# Patient Record
Sex: Female | Born: 1935 | Race: White | Hispanic: No | Smoking: Former smoker
Health system: Southern US, Community
[De-identification: ages and names within clinical notes are randomized; demographics above are authoritative.]

## PROBLEM LIST (undated history)

## (undated) DIAGNOSIS — L719 Rosacea, unspecified: Secondary | ICD-10-CM

## (undated) DIAGNOSIS — E785 Hyperlipidemia, unspecified: Secondary | ICD-10-CM

## (undated) DIAGNOSIS — F32A Depression, unspecified: Secondary | ICD-10-CM

## (undated) DIAGNOSIS — F39 Unspecified mood [affective] disorder: Secondary | ICD-10-CM

## (undated) DIAGNOSIS — N83209 Unspecified ovarian cyst, unspecified side: Secondary | ICD-10-CM

## (undated) DIAGNOSIS — N183 Chronic kidney disease, stage 3 unspecified: Secondary | ICD-10-CM

## (undated) DIAGNOSIS — F329 Major depressive disorder, single episode, unspecified: Secondary | ICD-10-CM

## (undated) DIAGNOSIS — G250 Essential tremor: Secondary | ICD-10-CM

## (undated) DIAGNOSIS — R339 Retention of urine, unspecified: Secondary | ICD-10-CM

## (undated) DIAGNOSIS — N189 Chronic kidney disease, unspecified: Secondary | ICD-10-CM

## (undated) DIAGNOSIS — M5414 Radiculopathy, thoracic region: Secondary | ICD-10-CM

## (undated) DIAGNOSIS — L219 Seborrheic dermatitis, unspecified: Secondary | ICD-10-CM

## (undated) DIAGNOSIS — M546 Pain in thoracic spine: Secondary | ICD-10-CM

## (undated) DIAGNOSIS — G459 Transient cerebral ischemic attack, unspecified: Secondary | ICD-10-CM

## (undated) DIAGNOSIS — I10 Essential (primary) hypertension: Secondary | ICD-10-CM

## (undated) DIAGNOSIS — G610 Guillain-Barre syndrome: Secondary | ICD-10-CM

## (undated) DIAGNOSIS — E039 Hypothyroidism, unspecified: Secondary | ICD-10-CM

## (undated) DIAGNOSIS — R400 Somnolence: Secondary | ICD-10-CM

## (undated) DIAGNOSIS — M810 Age-related osteoporosis without current pathological fracture: Secondary | ICD-10-CM

## (undated) DIAGNOSIS — Z79890 Hormone replacement therapy (postmenopausal): Secondary | ICD-10-CM

## (undated) DIAGNOSIS — H6121 Impacted cerumen, right ear: Secondary | ICD-10-CM

## (undated) DIAGNOSIS — K5900 Constipation, unspecified: Secondary | ICD-10-CM

## (undated) DIAGNOSIS — H6123 Impacted cerumen, bilateral: Secondary | ICD-10-CM

## (undated) DIAGNOSIS — R251 Tremor, unspecified: Secondary | ICD-10-CM

## (undated) HISTORY — DX: Tremor, unspecified: R25.1

## (undated) HISTORY — DX: Age-related osteoporosis without current pathological fracture: M81.0

## (undated) HISTORY — DX: Depression, unspecified: F32.A

## (undated) HISTORY — DX: Hormone replacement therapy: Z79.890

## (undated) HISTORY — DX: Essential (primary) hypertension: I10

## (undated) HISTORY — DX: Somnolence: R40.0

## (undated) HISTORY — DX: Hypothyroidism, unspecified: E03.9

## (undated) HISTORY — DX: Seborrheic dermatitis, unspecified: L21.9

## (undated) HISTORY — DX: Unspecified mood (affective) disorder: F39

## (undated) HISTORY — DX: Chronic kidney disease, unspecified: N18.9

## (undated) HISTORY — PX: HEEL SPUR SURGERY: SHX665

## (undated) HISTORY — PX: GALLBLADDER SURGERY: SHX652

## (undated) HISTORY — DX: Impacted cerumen, bilateral: H61.23

## (undated) HISTORY — DX: Chronic kidney disease, stage 3 unspecified: N18.30

## (undated) HISTORY — DX: Constipation, unspecified: K59.00

## (undated) HISTORY — DX: Essential tremor: G25.0

## (undated) HISTORY — DX: Transient cerebral ischemic attack, unspecified: G45.9

## (undated) HISTORY — DX: Rosacea, unspecified: L71.9

## (undated) HISTORY — DX: Retention of urine, unspecified: R33.9

## (undated) HISTORY — DX: Hyperlipidemia, unspecified: E78.5

## (undated) HISTORY — DX: Radiculopathy, thoracic region: M54.14

## (undated) HISTORY — DX: Unspecified ovarian cyst, unspecified side: N83.209

## (undated) HISTORY — PX: FACIAL COSMETIC SURGERY: SHX629

## (undated) HISTORY — DX: Pain in thoracic spine: M54.6

## (undated) HISTORY — DX: Impacted cerumen, right ear: H61.21

## (undated) HISTORY — PX: TONSILLECTOMY: SUR1361

## (undated) HISTORY — DX: Major depressive disorder, single episode, unspecified: F32.9

---

## 1998-08-21 ENCOUNTER — Ambulatory Visit (HOSPITAL_COMMUNITY): Admission: RE | Admit: 1998-08-21 | Discharge: 1998-08-21 | Payer: Self-pay | Admitting: Gastroenterology

## 1999-09-02 ENCOUNTER — Other Ambulatory Visit: Admission: RE | Admit: 1999-09-02 | Discharge: 1999-09-02 | Payer: Self-pay | Admitting: *Deleted

## 1999-09-09 ENCOUNTER — Encounter: Admission: RE | Admit: 1999-09-09 | Discharge: 1999-09-09 | Payer: Self-pay | Admitting: *Deleted

## 1999-09-09 ENCOUNTER — Encounter: Payer: Self-pay | Admitting: *Deleted

## 2000-02-28 ENCOUNTER — Encounter: Admission: RE | Admit: 2000-02-28 | Discharge: 2000-02-28 | Payer: Self-pay | Admitting: *Deleted

## 2000-02-28 ENCOUNTER — Encounter: Payer: Self-pay | Admitting: *Deleted

## 2000-04-13 ENCOUNTER — Other Ambulatory Visit: Admission: RE | Admit: 2000-04-13 | Discharge: 2000-04-13 | Payer: Self-pay

## 2000-09-22 ENCOUNTER — Encounter: Payer: Self-pay | Admitting: *Deleted

## 2000-09-22 ENCOUNTER — Encounter: Admission: RE | Admit: 2000-09-22 | Discharge: 2000-09-22 | Payer: Self-pay | Admitting: *Deleted

## 2001-09-06 ENCOUNTER — Other Ambulatory Visit: Admission: RE | Admit: 2001-09-06 | Discharge: 2001-09-06 | Payer: Self-pay | Admitting: *Deleted

## 2001-09-16 ENCOUNTER — Encounter: Payer: Self-pay | Admitting: *Deleted

## 2001-09-16 ENCOUNTER — Encounter: Admission: RE | Admit: 2001-09-16 | Discharge: 2001-09-16 | Payer: Self-pay | Admitting: *Deleted

## 2002-03-28 ENCOUNTER — Other Ambulatory Visit: Admission: RE | Admit: 2002-03-28 | Discharge: 2002-03-28 | Payer: Self-pay | Admitting: Obstetrics and Gynecology

## 2002-09-08 ENCOUNTER — Other Ambulatory Visit: Admission: RE | Admit: 2002-09-08 | Discharge: 2002-09-08 | Payer: Self-pay | Admitting: Obstetrics and Gynecology

## 2002-09-30 ENCOUNTER — Encounter: Payer: Self-pay | Admitting: Obstetrics and Gynecology

## 2002-09-30 ENCOUNTER — Encounter: Admission: RE | Admit: 2002-09-30 | Discharge: 2002-09-30 | Payer: Self-pay | Admitting: Obstetrics and Gynecology

## 2002-10-28 ENCOUNTER — Ambulatory Visit (HOSPITAL_COMMUNITY): Admission: RE | Admit: 2002-10-28 | Discharge: 2002-10-28 | Payer: Self-pay | Admitting: Neurology

## 2002-10-28 ENCOUNTER — Encounter: Payer: Self-pay | Admitting: Neurology

## 2003-09-28 ENCOUNTER — Other Ambulatory Visit: Admission: RE | Admit: 2003-09-28 | Discharge: 2003-09-28 | Payer: Self-pay | Admitting: Obstetrics and Gynecology

## 2003-10-13 ENCOUNTER — Encounter: Admission: RE | Admit: 2003-10-13 | Discharge: 2003-10-13 | Payer: Self-pay | Admitting: Family Medicine

## 2003-10-25 ENCOUNTER — Ambulatory Visit (HOSPITAL_COMMUNITY): Admission: RE | Admit: 2003-10-25 | Discharge: 2003-10-25 | Payer: Self-pay | Admitting: Gastroenterology

## 2004-10-22 ENCOUNTER — Encounter: Admission: RE | Admit: 2004-10-22 | Discharge: 2004-10-22 | Payer: Self-pay | Admitting: Obstetrics and Gynecology

## 2005-02-19 ENCOUNTER — Emergency Department (HOSPITAL_COMMUNITY): Admission: EM | Admit: 2005-02-19 | Discharge: 2005-02-19 | Payer: Self-pay | Admitting: Family Medicine

## 2005-02-25 ENCOUNTER — Other Ambulatory Visit: Admission: RE | Admit: 2005-02-25 | Discharge: 2005-02-25 | Payer: Self-pay | Admitting: *Deleted

## 2005-11-27 ENCOUNTER — Encounter: Admission: RE | Admit: 2005-11-27 | Discharge: 2005-11-27 | Payer: Self-pay | Admitting: Family Medicine

## 2006-06-25 ENCOUNTER — Other Ambulatory Visit: Admission: RE | Admit: 2006-06-25 | Discharge: 2006-06-25 | Payer: Self-pay | Admitting: Obstetrics & Gynecology

## 2006-09-08 ENCOUNTER — Encounter: Admission: RE | Admit: 2006-09-08 | Discharge: 2006-09-08 | Payer: Self-pay | Admitting: Obstetrics & Gynecology

## 2007-01-01 ENCOUNTER — Encounter: Admission: RE | Admit: 2007-01-01 | Discharge: 2007-01-01 | Payer: Self-pay | Admitting: Family Medicine

## 2007-02-27 ENCOUNTER — Emergency Department (HOSPITAL_COMMUNITY): Admission: EM | Admit: 2007-02-27 | Discharge: 2007-02-27 | Payer: Self-pay | Admitting: Emergency Medicine

## 2008-02-29 ENCOUNTER — Encounter: Admission: RE | Admit: 2008-02-29 | Discharge: 2008-02-29 | Payer: Self-pay | Admitting: Obstetrics and Gynecology

## 2008-12-11 ENCOUNTER — Encounter: Admission: RE | Admit: 2008-12-11 | Discharge: 2008-12-11 | Payer: Self-pay | Admitting: Family Medicine

## 2010-04-06 ENCOUNTER — Encounter: Payer: Self-pay | Admitting: Obstetrics and Gynecology

## 2010-04-07 ENCOUNTER — Encounter: Payer: Self-pay | Admitting: Obstetrics and Gynecology

## 2010-08-02 NOTE — Op Note (Signed)
NAME:  Brandy Hicks, Brandy Hicks                   ACCOUNT NO.:  [id number]   MEDICAL RECORD NO.:  [id number]                   PATIENT TYPE:  AMB   LOCATION:  ENDO                                 FACILITY:  [facility]   PHYSICIAN:  Anselmo Rod, M.D.               DATE OF BIRTH:  [birth year]   DATE OF PROCEDURE:  10/26/2003  DATE OF DISCHARGE:                                 OPERATIVE REPORT   PROCEDURE:  Screening colonoscopy.   ENDOSCOPIST:  Charna Elizabeth, M.D.   INSTRUMENT USED:  Olympus video colonoscope.   INDICATIONS FOR PROCEDURE:  77 year old white female with a personal history  of colonic polyps and recent history of rectal bleeding undergoing screening  colonoscopy to rule out recurrent polyps.   PREPROCEDURE PREPARATION:  Informed consent was obtained from the patient.  The patient was fasted for eight hours prior to the procedure and prepped  with a bottle of magnesium citrate and a gallon of GoLYTELY the night prior  to the procedure.   PREPROCEDURE PHYSICAL:  Patient with stable vital signs.  Neck supple.  Chest clear to auscultation.  S1 and S2 regular.  Abdomen soft with normal  bowel sounds.  No masses palpable.   DESCRIPTION OF PROCEDURE:  The patient was placed in the left lateral  decubitus position, sedated with 100 mg of Demerol and 7.5 mg Versed in slow  incremental doses.  Once the patient was adequately sedated, maintained on  low flow oxygen and continuous cardiac monitoring, the Olympus video  colonoscope was advanced from the rectum to the cecum.  Prominent external  hemorrhoids and prolapsing internal hemorrhoids were noted.  Small internal  hemorrhoids were also present.  The rest of the colonic mucosa appeared  healthy.  There was some residual stool in the colon, multiple masses were  done.  No masses, polyps, erosions, ulcerations, or diverticula were  appreciated.  The appendiceal orifice and ileocecal valve were clearly  visualized and photographed.   The patient tolerated the procedure well  without complications.  No source of bleeding could be identified besides  the prominent hemorrhoids mentioned above.   IMPRESSION:  Large external and prolapsing internal hemorrhoids, otherwise,  unrevealing colonoscopy.  No masses, polyps, or diverticula seen.   RECOMMENDATIONS:  1. Repeat colonoscopy in the next five years unless the patient develops any     abnormal symptoms in the interim.  2. Outpatient follow up in the next two weeks to discuss possible surgical     evaluation for hemorrhoidectomy.                                               Anselmo Rod, M.D.   JNM/MEDQ  D:  10/26/2003  T:  10/26/2003  Job:  [id number]   cc:   Angelena Sole, M.D. [place]

## 2011-04-09 DIAGNOSIS — G4700 Insomnia, unspecified: Secondary | ICD-10-CM | POA: Insufficient documentation

## 2011-04-09 DIAGNOSIS — F329 Major depressive disorder, single episode, unspecified: Secondary | ICD-10-CM | POA: Insufficient documentation

## 2011-04-09 DIAGNOSIS — F32A Depression, unspecified: Secondary | ICD-10-CM | POA: Insufficient documentation

## 2013-11-28 ENCOUNTER — Encounter: Payer: Self-pay | Admitting: General Surgery

## 2013-11-28 DIAGNOSIS — I10 Essential (primary) hypertension: Secondary | ICD-10-CM

## 2015-04-22 ENCOUNTER — Emergency Department (HOSPITAL_COMMUNITY): Payer: Medicare Other

## 2015-04-22 ENCOUNTER — Inpatient Hospital Stay (HOSPITAL_COMMUNITY)
Admission: EM | Admit: 2015-04-22 | Discharge: 2015-04-24 | DRG: 081 | Disposition: A | Discharge status: Skilled Nursing Facility | Payer: Medicare Other | Attending: Internal Medicine | Admitting: Internal Medicine

## 2015-04-22 ENCOUNTER — Encounter (HOSPITAL_COMMUNITY): Payer: Self-pay

## 2015-04-22 DIAGNOSIS — E039 Hypothyroidism, unspecified: Secondary | ICD-10-CM | POA: Diagnosis not present

## 2015-04-22 DIAGNOSIS — T402X5A Adverse effect of other opioids, initial encounter: Secondary | ICD-10-CM | POA: Diagnosis present

## 2015-04-22 DIAGNOSIS — R4182 Altered mental status, unspecified: Secondary | ICD-10-CM | POA: Diagnosis not present

## 2015-04-22 DIAGNOSIS — R0902 Hypoxemia: Secondary | ICD-10-CM | POA: Diagnosis present

## 2015-04-22 DIAGNOSIS — R197 Diarrhea, unspecified: Secondary | ICD-10-CM | POA: Diagnosis present

## 2015-04-22 DIAGNOSIS — R319 Hematuria, unspecified: Secondary | ICD-10-CM | POA: Diagnosis present

## 2015-04-22 DIAGNOSIS — Z8673 Personal history of transient ischemic attack (TIA), and cerebral infarction without residual deficits: Secondary | ICD-10-CM

## 2015-04-22 DIAGNOSIS — T424X5A Adverse effect of benzodiazepines, initial encounter: Secondary | ICD-10-CM | POA: Diagnosis present

## 2015-04-22 DIAGNOSIS — Y846 Urinary catheterization as the cause of abnormal reaction of the patient, or of later complication, without mention of misadventure at the time of the procedure: Secondary | ICD-10-CM | POA: Diagnosis present

## 2015-04-22 DIAGNOSIS — Z79899 Other long term (current) drug therapy: Secondary | ICD-10-CM | POA: Diagnosis not present

## 2015-04-22 DIAGNOSIS — T426X5A Adverse effect of other antiepileptic and sedative-hypnotic drugs, initial encounter: Secondary | ICD-10-CM | POA: Diagnosis present

## 2015-04-22 DIAGNOSIS — F4321 Adjustment disorder with depressed mood: Secondary | ICD-10-CM | POA: Diagnosis present

## 2015-04-22 DIAGNOSIS — E785 Hyperlipidemia, unspecified: Secondary | ICD-10-CM | POA: Diagnosis present

## 2015-04-22 DIAGNOSIS — Z79891 Long term (current) use of opiate analgesic: Secondary | ICD-10-CM

## 2015-04-22 DIAGNOSIS — Z87891 Personal history of nicotine dependence: Secondary | ICD-10-CM | POA: Diagnosis not present

## 2015-04-22 DIAGNOSIS — I10 Essential (primary) hypertension: Secondary | ICD-10-CM | POA: Diagnosis present

## 2015-04-22 DIAGNOSIS — R400 Somnolence: Principal | ICD-10-CM | POA: Diagnosis present

## 2015-04-22 DIAGNOSIS — T83518A Infection and inflammatory reaction due to other urinary catheter, initial encounter: Secondary | ICD-10-CM | POA: Diagnosis present

## 2015-04-22 DIAGNOSIS — G610 Guillain-Barre syndrome: Secondary | ICD-10-CM | POA: Diagnosis not present

## 2015-04-22 DIAGNOSIS — N390 Urinary tract infection, site not specified: Secondary | ICD-10-CM | POA: Diagnosis present

## 2015-04-22 DIAGNOSIS — F39 Unspecified mood [affective] disorder: Secondary | ICD-10-CM | POA: Diagnosis not present

## 2015-04-22 DIAGNOSIS — R410 Disorientation, unspecified: Secondary | ICD-10-CM | POA: Diagnosis present

## 2015-04-22 DIAGNOSIS — N179 Acute kidney failure, unspecified: Secondary | ICD-10-CM | POA: Diagnosis present

## 2015-04-22 DIAGNOSIS — I959 Hypotension, unspecified: Secondary | ICD-10-CM | POA: Diagnosis not present

## 2015-04-22 DIAGNOSIS — G250 Essential tremor: Secondary | ICD-10-CM | POA: Diagnosis present

## 2015-04-22 HISTORY — DX: Guillain-Barre syndrome: G61.0

## 2015-04-22 LAB — I-STAT TROPONIN, ED: Troponin i, poc: 0 ng/mL (ref 0.00–0.08)

## 2015-04-22 LAB — URINALYSIS, ROUTINE W REFLEX MICROSCOPIC
GLUCOSE, UA: NEGATIVE mg/dL
Hgb urine dipstick: NEGATIVE
KETONES UR: 15 mg/dL — AB
Nitrite: POSITIVE — AB
PH: 5 (ref 5.0–8.0)
Protein, ur: 30 mg/dL — AB
SPECIFIC GRAVITY, URINE: 1.02 (ref 1.005–1.030)

## 2015-04-22 LAB — C DIFFICILE QUICK SCREEN W PCR REFLEX
C DIFFICILE (CDIFF) INTERP: NEGATIVE
C Diff antigen: NEGATIVE
C Diff toxin: NEGATIVE

## 2015-04-22 LAB — COMPREHENSIVE METABOLIC PANEL
ALT: 18 U/L (ref 14–54)
ANION GAP: 12 (ref 5–15)
AST: 33 U/L (ref 15–41)
Albumin: 1.9 g/dL — ABNORMAL LOW (ref 3.5–5.0)
Alkaline Phosphatase: 86 U/L (ref 38–126)
BILIRUBIN TOTAL: 0.7 mg/dL (ref 0.3–1.2)
BUN: 50 mg/dL — AB (ref 6–20)
CALCIUM: 7.2 mg/dL — AB (ref 8.9–10.3)
CO2: 22 mmol/L (ref 22–32)
Chloride: 98 mmol/L — ABNORMAL LOW (ref 101–111)
Creatinine, Ser: 1.5 mg/dL — ABNORMAL HIGH (ref 0.44–1.00)
GFR calc Af Amer: 37 mL/min — ABNORMAL LOW (ref >60)
GFR, EST NON AFRICAN AMERICAN: 32 mL/min — AB (ref >60)
Glucose, Bld: 95 mg/dL (ref 65–99)
POTASSIUM: 3.6 mmol/L (ref 3.5–5.1)
Sodium: 132 mmol/L — ABNORMAL LOW (ref 135–145)
TOTAL PROTEIN: 5.3 g/dL — AB (ref 6.5–8.1)

## 2015-04-22 LAB — URINE MICROSCOPIC-ADD ON

## 2015-04-22 LAB — CBC WITH DIFFERENTIAL/PLATELET
BASOS ABS: 0 10*3/uL (ref 0.0–0.1)
Basophils Relative: 0 %
EOS ABS: 0.1 10*3/uL (ref 0.0–0.7)
Eosinophils Relative: 1 %
HCT: 32.5 % — ABNORMAL LOW (ref 36.0–46.0)
HEMOGLOBIN: 11.3 g/dL — AB (ref 12.0–15.0)
LYMPHS PCT: 8 %
Lymphs Abs: 0.7 10*3/uL (ref 0.7–4.0)
MCH: 31.9 pg (ref 26.0–34.0)
MCHC: 34.8 g/dL (ref 30.0–36.0)
MCV: 91.8 fL (ref 78.0–100.0)
MONO ABS: 0.8 10*3/uL (ref 0.1–1.0)
Monocytes Relative: 9 %
NEUTROS ABS: 6.9 10*3/uL (ref 1.7–7.7)
NEUTROS PCT: 82 %
PLATELETS: 295 10*3/uL (ref 150–400)
RBC: 3.54 MIL/uL — ABNORMAL LOW (ref 3.87–5.11)
RDW: 13.8 % (ref 11.5–15.5)
WBC: 8.5 10*3/uL (ref 4.0–10.5)

## 2015-04-22 LAB — LACTIC ACID, PLASMA
LACTIC ACID, VENOUS: 1.9 mmol/L (ref 0.5–2.0)
Lactic Acid, Venous: 1.9 mmol/L (ref 0.5–2.0)

## 2015-04-22 LAB — VALPROIC ACID LEVEL: VALPROIC ACID LVL: 20 ug/mL — AB (ref 50.0–100.0)

## 2015-04-22 LAB — CBG MONITORING, ED: GLUCOSE-CAPILLARY: 106 mg/dL — AB (ref 65–99)

## 2015-04-22 MED ORDER — SODIUM CHLORIDE 0.9 % IV BOLUS (SEPSIS)
1000.0000 mL | Freq: Once | INTRAVENOUS | Status: AC
  Administered 2015-04-22: 1000 mL via INTRAVENOUS

## 2015-04-22 MED ORDER — CLONAZEPAM 0.5 MG PO TABS
0.5000 mg | ORAL_TABLET | Freq: Three times a day (TID) | ORAL | Status: DC | PRN
  Filled 2015-04-22: qty 1

## 2015-04-22 MED ORDER — HALOPERIDOL LACTATE 5 MG/ML IJ SOLN
5.0000 mg | Freq: Once | INTRAMUSCULAR | Status: AC
  Administered 2015-04-22: 5 mg via INTRAVENOUS

## 2015-04-22 MED ORDER — HALOPERIDOL LACTATE 5 MG/ML IJ SOLN
INTRAMUSCULAR | Status: AC
  Filled 2015-04-22: qty 1

## 2015-04-22 MED ORDER — DEXTROSE 5 % IV SOLN
1.0000 g | INTRAVENOUS | Status: DC
  Administered 2015-04-22 – 2015-04-23 (×2): 1 g via INTRAVENOUS
  Filled 2015-04-22 (×4): qty 10

## 2015-04-22 MED ORDER — SODIUM CHLORIDE 0.9 % IV SOLN: INTRAVENOUS | Status: DC

## 2015-04-22 MED ORDER — ENOXAPARIN SODIUM 30 MG/0.3ML ~~LOC~~ SOLN
30.0000 mg | SUBCUTANEOUS | Status: DC
  Filled 2015-04-22: qty 0.3

## 2015-04-22 MED ORDER — DEXTROSE 5 % IV SOLN
1.0000 g | Freq: Once | INTRAVENOUS | Status: AC
  Administered 2015-04-22: 1 g via INTRAVENOUS
  Filled 2015-04-22: qty 10

## 2015-04-22 MED ORDER — CETYLPYRIDINIUM CHLORIDE 0.05 % MT LIQD
7.0000 mL | Freq: Two times a day (BID) | OROMUCOSAL | Status: DC
  Administered 2015-04-23 (×2): 7 mL via OROMUCOSAL

## 2015-04-22 NOTE — ED Notes (Signed)
To room via EMS.  Pt at [facility] since 04-12-15 for rehab for lower leg weakness d/t guuillain-barre syndrome, in which, symptoms have resolved.  Pts granddaughter at bedside.  Pt has had diarrhea, decreased intake x 3-4 days, onset yesterday pt sleeping more than usual and confused.  Report from EMS reports that pt has been on Vancomycin since 04-20-15 for c-diff.  Granddaughter advised pt has not been on antibiotics and stool sample obtained this morning.  Pt is A&O to self only.

## 2015-04-22 NOTE — H&P (Signed)
Date: 04/22/2015               Patient Name:  Brandy Hicks MRN: [id number]  DOB: 04-Oct-1932 Age / Sex: 82 y.o., female   PCP: No primary care provider on file.         Medical Service: Internal Medicine Teaching Service         Attending Physician: Dr. Nelva Nay, MD    First Contact: Dr. Selina Cooley Pager: [phone]  Second Contact: Dr. Heywood Iles Pager: [phone]       After Hours (After 5p/  First Contact Pager: [phone]  weekends / holidays): Second Contact Pager: [phone]   Chief Complaint: Confusion and somnolence  History of Present Illness:  Brandy Hicks is an 82 year old lady with history of Guillain-Barr syndrome, hypothyroidism, hypertension, and essential tremor presenting with somnolence and confusion.  The patient herself is unable to provide the history, she is confused and lethargic on exam, so the history was obtained by her granddaughter, who is her healthcare power of attorney.  Three weeks ago, she was hospitalized at [facility] in [place] for Guillan Barr syndrome, where she underwent IVIG treatment and had a Foley catheter placed for the duration of her stay of one week. During her hospitalization, her husband died from a ruptured abdominal aortic aneurysm. After she was discharged, her granddaughter, who is her power of attorney, drove her up to [place] where she lives with her 2 kids. She was put in [facility] nursing home, where she has been for the last week. She has not been walking yet, but was mentating well and carry on full conversations during that time.  Five days ago, however, she started acting confused and became very sleepy. She was complaining of intermittent lower back pain, but had no other complaints. 2 days later, she started having profuse watery, foul swelling diarrhea. She did not receive any antibiotics during her stay at [facility], to the granddaughter's knowledge.  I was not able to complete a review of systems as the patient  was altered and unable to provide a history.  Meds: Current Facility-Administered Medications  Medication Dose Route Frequency Provider Last Rate Last Dose  . cefTRIAXone (ROCEPHIN) 1 g in dextrose 5 % 50 mL IVPB  1 g Intravenous Once Serena Y Sam, PA-C 100 mL/hr at 04/22/15 1644 1 g at 04/22/15 1644   Current Outpatient Prescriptions  Medication Sig Dispense Refill  . amLODipine (NORVASC) 5 MG tablet Take 5 mg by mouth daily.    . clonazePAM (KLONOPIN) 0.5 MG tablet Take 0.5 mg by mouth 3 (three) times daily.    . cloNIDine (CATAPRES) 0.1 MG tablet Take 0.1 mg by mouth 2 (two) times daily.    . diclofenac (VOLTAREN) 50 MG EC tablet Take 50 mg by mouth every 8 (eight) hours.    . divalproex (DEPAKOTE SPRINKLE) 125 MG capsule Take 250 mg by mouth at bedtime.    Marland Kitchen levothyroxine (SYNTHROID, LEVOTHROID) 125 MCG tablet Take 125 mcg by mouth daily before breakfast.    . loperamide (IMODIUM A-D) 2 MG tablet Take 2 mg by mouth 4 (four) times daily as needed for diarrhea or loose stools.    Marland Kitchen oxyCODONE (OXYCONTIN) 20 mg 12 hr tablet Take 20 mg by mouth every 12 (twelve) hours.    . primidone (MYSOLINE) 50 MG tablet Take 200 mg by mouth 2 (two) times daily.    . sodium chloride 0.9 % SOLN See admin instructions. , Clysis at 50cc/hour for 3  days, to end at 5pm on 2/7. Started @@ 10pm on 2/4. Schedule at 6AM, 2PM, and 10PM    . zolpidem (AMBIEN) 5 MG tablet Take 5 mg by mouth at bedtime as needed for sleep.      Allergies: Allergies as of 04/22/2015  . (No Known Allergies)   Past Medical History  Diagnosis Date  . Hypertension   . Tremor   . Hyperlipidemia   . Hormone replacement therapy   . Osteoporosis   . Depression   . Hypothyroid   . Rosacea   . TIA (transient ischemic attack)   . Guillain Barr syndrome [facility])    Past Surgical History  Procedure Laterality Date  . Tonsillectomy    . Heel spur surgery    . Facial cosmetic surgery     Family History  Problem Relation Age of  Onset  . Stroke Mother   . Hypertension Father   . CAD Father   . Prostate cancer Father    Social History   Social History  . Marital Status: Married    Spouse Name: N/A  . Number of Children: N/A  . Years of Education: N/A   Occupational History  . Not on file.   Social History Main Topics  . Smoking status: Former Games developer  . Smokeless tobacco: Not on file  . Alcohol Use: Yes  . Drug Use: No  . Sexual Activity: Not on file   Other Topics Concern  . Not on file   Social History Narrative    Review of Systems: Per HPI  Physical Exam: Blood pressure 102/48, pulse 75, temperature 98.4 F (36.9 C), temperature source Rectal, resp. rate 16, SpO2 99 %. General: elderly lady resting in bed comfortably, falling asleep while we talk to her daughter HEENT: no scleral icterus, extra-ocular muscles intact, oropharynx without lesions Cardiac: regular rate and rhythm, no rubs, murmurs or gallops Pulm: breathing well, clear to auscultation bilaterally Abd: bowel sounds normal, soft, nondistended, non-tender Ext: warm and well perfused, without pedal edema Lymph: no cervical or supraclavicular lymphadenopathy Skin: no rash, hair, or nail changes Neuro: alert and oriented X3, cranial nerves II-XII grossly intact, moving all extremities well  Lab results: Basic Metabolic Panel:  Recent Labs  83/15/17 1446  NA 132*  K 3.6  CL 98*  CO2 22  GLUCOSE 95  BUN 50*  CREATININE 1.50*  CALCIUM 7.2*   Liver Function Tests:  Recent Labs  04/22/15 1446  AST 33  ALT 18  ALKPHOS 86  BILITOT 0.7  PROT 5.3*  ALBUMIN 1.9*   CBC:  Recent Labs  04/22/15 1446  WBC 8.5  NEUTROABS 6.9  HGB 11.3*  HCT 32.5*  MCV 91.8  PLT 295   Urinalysis:  Recent Labs  04/22/15 1529  COLORURINE RED*  LABSPEC 1.020  PHURINE 5.0  GLUCOSEU NEGATIVE  HGBUR NEGATIVE  BILIRUBINUR LARGE*  KETONESUR 15*  PROTEINUR 30*  NITRITE POSITIVE*  LEUKOCYTESUR MODERATE*   C. Diff negative  toxin and antigen  Imaging results:  Dg Chest Portable 1 View  04/22/2015  CLINICAL DATA:  Altered mental status for 3 days.  Former smoker. EXAM: PORTABLE CHEST 1 VIEW COMPARISON:  02/27/2007 FINDINGS: The heart size and mediastinal contours are within normal limits. Both lungs are clear. The visualized skeletal structures are unremarkable. IMPRESSION: No active disease. Electronically Signed   By: Elige Ko   On: 04/22/2015 15:09    Other results: EKG: normal EKG, normal sinus rhythm, unchanged from previous tracings.  Assessment & Plan by Problem:  Catheter-associated urinary tract infection: I think this is the most likely source of her somnolence and confusion. She received a dose of ceftriaxone in the emergency department and we'll continue this empirically and tailor antibiotics pending culture results.  She's hypotensive after 1L so we'll continue aggressive rehydration. We may have to consider pressor support if she doesn't respond which is what the grandaugther, power of attorney, agreed to do. She's gotten 1L thus far. -Continue ceftriaxone -Start NS at 100cc/hr after she gets 2L bolus -Bolus to keep MAP>65 -Trending lactic acids  Guillan-barre syndrome: She was treated at [facility] three weeks ago but we can't get access to the records. The granddaughter tells [place] she still is not able to walk. -Will obtain records tomorrow -Consulted PT, OT, and speech therapy  Acute kidney injury: Her creatinine is 1.5; we don't have a baseline but I suspect this is pre-renal from hypovolemia. -Continue hydration per above  Profuse watery diarrhea: C diff toxin was negative so we'll resume ceftriaxone for her UTI.  Hypothyroidism: We'll continue her home dose of levothyroxine -Continue levothyroxine daily  Hypertension: Pressures are soft so we'll hold her antihypertensives. -Holding amlodipine  daily -Holding clonidine 0.1mg  twice daily  Mood disorder: We'll hold her  depakote. -Holding depakote  twice daily  Essential tremor: We'll hold her primidone as this can cause drowsiness. -Holding primidone  twice daily  Dispo: Disposition is deferred at this time, awaiting improvement of current medical problems. Anticipated discharge in approximately 2-4 day(s).   The patient does not have a current PCP (No primary care provider on file.) and does need an [facility] follow-up appointment after discharge.  The patient does have transportation limitations that hinder transportation to clinic appointments.  Signed: Selina Cooley, MD 04/22/2015, 5:01 PM

## 2015-04-22 NOTE — ED Provider Notes (Signed)
CSN: [id number]     Arrival date & time 04/22/15  1359 History   First MD Initiated Contact with Patient 04/22/15 1412     Chief Complaint  Patient presents with  . Altered Mental Status     HPI  [name] is an 82 y.o. female with history of HTN, HLD, guillain barre syndrome who presents to the ED from her nursing home for evaluation of AMS. She is accompanied by her granddaughter who provides her history due to mental status change. Pt moved to [place] one week ago from [place]. She was was discharged from a hospital in [place] ~3 weeks ago where she was hospitalized for weakness and diagnosed with Alene Mires. Pt's granddaughter states that ever since pt has been in the nursing home here she has deteriorated and over the past 3 days in particular become more and more confused. States that pt has not recognized her and has been disoriented, drowsy. Pt's granddaughter also reports pt has had numerous episodes of watery, foul-smelling diarrhea over the past few days. She has not been eating or drinking. Nursing home physician apparently evaluated pt this morning and drew labs including stool sample for c diff study as pt was treated with antibiotics in [place] [facility]. Nursing home physician found pt to be hypoxic to the mid 70s on room air today so EMS was called. In the ED pt SpO2 improved to 99% with 3L O2 by [place]. This is not normal for her. Per pt's granddaughter, pt has no oxygen requirement. At baseline pt reportedly has normal mentation and is very functionally independent. In the ED pt is drowsy but arousable, afebrile, not tachycardic, not hypotensive. She is oriented to person only and states she is in [facility] in [place], states the year is 77.   Past Medical History  Diagnosis Date  . Hypertension   . Tremor   . Hyperlipidemia   . Hormone replacement therapy   . Osteoporosis   . Depression   . Hypothyroid   . Rosacea   . TIA (transient ischemic attack)   . Guillain Barr  syndrome [facility])    Past Surgical History  Procedure Laterality Date  . Tonsillectomy    . Heel spur surgery    . Facial cosmetic surgery     Family History  Problem Relation Age of Onset  . Stroke Mother   . Hypertension Father   . CAD Father   . Prostate cancer Father    Social History  Substance Use Topics  . Smoking status: Former Games developer  . Smokeless tobacco: None  . Alcohol Use: Yes   OB History    No data available     Review of Systems  Unable to perform ROS: Mental status change      Allergies  Review of patient's allergies indicates no known allergies.  Home Medications   Prior to Admission medications   Medication Sig Start Date End Date Taking? Authorizing Provider  amLODipine (NORVASC) 5 MG tablet Take 5 mg by mouth daily.   Yes Historical Provider, MD  clonazePAM (KLONOPIN) 0.5 MG tablet Take 0.5 mg by mouth 3 (three) times daily.   Yes Historical Provider, MD  cloNIDine (CATAPRES) 0.1 MG tablet Take 0.1 mg by mouth 2 (two) times daily.   Yes Historical Provider, MD  diclofenac (VOLTAREN) 50 MG EC tablet Take 50 mg by mouth every 8 (eight) hours.   Yes Historical Provider, MD  divalproex (DEPAKOTE SPRINKLE) 125 MG capsule Take 250 mg by mouth  at bedtime.   Yes Historical Provider, MD  levothyroxine (SYNTHROID, LEVOTHROID) 125 MCG tablet Take 125 mcg by mouth daily before breakfast.   Yes Historical Provider, MD  loperamide (IMODIUM A-D) 2 MG tablet Take 2 mg by mouth 4 (four) times daily as needed for diarrhea or loose stools.   Yes Historical Provider, MD  oxyCODONE (OXYCONTIN) 20 mg 12 hr tablet Take 20 mg by mouth every 12 (twelve) hours.   Yes Historical Provider, MD  primidone (MYSOLINE) 50 MG tablet Take 200 mg by mouth 2 (two) times daily.   Yes Historical Provider, MD  sodium chloride 0.9 % SOLN See admin instructions. , Clysis at 50cc/hour for 3 days, to end at 5pm on 2/7. Started @@ 10pm on 2/4. Schedule at 6AM, 2PM, and 10PM   Yes Historical  Provider, MD  zolpidem (AMBIEN) 5 MG tablet Take 5 mg by mouth at bedtime as needed for sleep.   Yes Historical Provider, MD   BP 124/38 mmHg  Pulse 85  Temp(Src) 98.4 F (36.9 C) (Rectal)  Resp 15  SpO2 98% Physical Exam  Constitutional: She appears lethargic.  HENT:  Right Ear: External ear normal.  Left Ear: External ear normal.  Nose: Nose normal.  Mouth/Throat: Oropharynx is clear and moist.  Eyes: Conjunctivae are normal. Pupils are equal, round, and reactive to light.  Neck: Normal range of motion. Neck supple.  Cardiovascular: Normal rate, regular rhythm, normal heart sounds and intact distal pulses.   Pulmonary/Chest: Effort normal and breath sounds normal. No respiratory distress. She has no wheezes. She exhibits no tenderness.  Abdominal: Soft. Bowel sounds are normal. She exhibits no distension. There is tenderness in the suprapubic area.  Musculoskeletal: She exhibits no edema.  Lymphadenopathy:    She has no cervical adenopathy.  Neurological: She appears lethargic. No cranial nerve deficit.  Drowsy but arousable. Oriented to person only. Will respond to commands to move arms and legs, will squeeze my hand though decreased grip strength that is symmetric bilaterally  Skin: Skin is warm and dry. There is pallor.  Psychiatric: She has a normal mood and affect.  Nursing note and vitals reviewed.   ED Course  Procedures (including critical care time) Labs Review Labs Reviewed  COMPREHENSIVE METABOLIC PANEL - Abnormal; Notable for the following:    Sodium 132 (*)    Chloride 98 (*)    BUN 50 (*)    Creatinine, Ser 1.50 (*)    Calcium 7.2 (*)    Total Protein 5.3 (*)    Albumin 1.9 (*)    GFR calc non Af Amer 32 (*)    GFR calc Af Amer 37 (*)    All other components within normal limits  CBC WITH DIFFERENTIAL/PLATELET - Abnormal; Notable for the following:    RBC 3.54 (*)    Hemoglobin 11.3 (*)    HCT 32.5 (*)    All other components within normal limits   URINALYSIS, ROUTINE W REFLEX MICROSCOPIC (NOT AT [facility]) - Abnormal; Notable for the following:    Color, Urine RED (*)    APPearance CLOUDY (*)    Bilirubin Urine LARGE (*)    Ketones, ur 15 (*)    Protein, ur 30 (*)    Nitrite POSITIVE (*)    Leukocytes, UA MODERATE (*)    All other components within normal limits  URINE MICROSCOPIC-ADD ON - Abnormal; Notable for the following:    Squamous Epithelial / LPF 6-30 (*)    Bacteria, UA MANY (*)  Casts GRANULAR CAST (*)    All other components within normal limits  CBG MONITORING, ED - Abnormal; Notable for the following:    Glucose-Capillary 106 (*)    All other components within normal limits  C DIFFICILE QUICK SCREEN W PCR REFLEX  URINE CULTURE  CULTURE, BLOOD (ROUTINE X 2)  CULTURE, BLOOD (ROUTINE X 2)  LACTIC ACID, PLASMA  LACTIC ACID, PLASMA  VALPROIC ACID LEVEL  I-STAT TROPOININ, ED    Imaging Review Dg Chest Portable 1 View  04/22/2015  CLINICAL DATA:  Altered mental status for 3 days.  Former smoker. EXAM: PORTABLE CHEST 1 VIEW COMPARISON:  02/27/2007 FINDINGS: The heart size and mediastinal contours are within normal limits. Both lungs are clear. The visualized skeletal structures are unremarkable. IMPRESSION: No active disease. Electronically Signed   By: Elige Ko   On: 04/22/2015 15:09   I have personally reviewed and evaluated these images and lab results as part of my medical decision-making.   EKG Interpretation   Date/Time:  Sunday April 22 2015 14:13:34 EST Ventricular Rate:  92 PR Interval:  151 QRS Duration: 84 QT Interval:  346 QTC Calculation: 428 R Axis:   44 Text Interpretation:  Sinus rhythm Nonspecific T abnormalities, lateral  leads ED PHYSICIAN INTERPRETATION AVAILABLE IN [facility] Confirmed  by TEST, Record ([id number]) on 04/23/2015 7:01:02 AM      MDM   Final diagnoses:  Urinary tract infection with hematuria, site unspecified  Altered mental status, unspecified altered mental  status type    Pt is an 82 y.o. female with history of recently diagnosed guillain barre, HTN, HLD, here from nursing home with AMS. History provided by granddaughter. She moved here one week ago and has slowly been declining, though with stark confusion over the past three days. Has had poor PO intake and copious diarrhea. Found to be hypoxic to mid 70s today at nursing home. This is not baseline. She desats to low 80s in the ED with removal of oxygen via [place]. Nonfocal neuro exam, mild low abdominal tenderness.We have no prior records so unclear baseline labs. However, pt found to have nitrite positive UTI (though e/o contamination as well). Slight hyponatremia 132, Cr 1.5, slightly anemic. C diff negative. Pt drowsy but arousable in the room. Responds to my commands but disoriented to time and place. Given UTI and AMS will call internal medicine for admission. Started on IV rocephin in the ED. She has been given 2 L NS so far. She has had soft pressures but otherwise afebrile, no tachycardia. Blood cultures and urine cultures drawn. Her granddaughter is unsure what medications pt is on but I see depakote in her med list so depakote level ordered as well. Labs including troponin and CXR otherwise negative. Unclear etiology of new oxygen requirement.   Spoke to Dr. Allena Katz PGY-2 for internal medicine admission. Will admit to tele.     Carlene Coria, PA-C 04/23/15 1028  Nelva Nay, MD 04/23/15 2308

## 2015-04-23 ENCOUNTER — Encounter (HOSPITAL_COMMUNITY): Payer: Self-pay | Admitting: *Deleted

## 2015-04-23 DIAGNOSIS — N179 Acute kidney failure, unspecified: Secondary | ICD-10-CM

## 2015-04-23 DIAGNOSIS — E039 Hypothyroidism, unspecified: Secondary | ICD-10-CM

## 2015-04-23 DIAGNOSIS — I10 Essential (primary) hypertension: Secondary | ICD-10-CM

## 2015-04-23 DIAGNOSIS — G610 Guillain-Barre syndrome: Secondary | ICD-10-CM

## 2015-04-23 DIAGNOSIS — G250 Essential tremor: Secondary | ICD-10-CM

## 2015-04-23 DIAGNOSIS — R4182 Altered mental status, unspecified: Secondary | ICD-10-CM

## 2015-04-23 DIAGNOSIS — F39 Unspecified mood [affective] disorder: Secondary | ICD-10-CM

## 2015-04-23 DIAGNOSIS — Z79899 Other long term (current) drug therapy: Secondary | ICD-10-CM

## 2015-04-23 DIAGNOSIS — R197 Diarrhea, unspecified: Secondary | ICD-10-CM

## 2015-04-23 LAB — BASIC METABOLIC PANEL
Anion gap: 15 (ref 5–15)
BUN: 42 mg/dL — ABNORMAL HIGH (ref 6–20)
CHLORIDE: 101 mmol/L (ref 101–111)
CO2: 20 mmol/L — ABNORMAL LOW (ref 22–32)
CREATININE: 1.24 mg/dL — AB (ref 0.44–1.00)
Calcium: 7.1 mg/dL — ABNORMAL LOW (ref 8.9–10.3)
GFR, EST AFRICAN AMERICAN: 46 mL/min — AB (ref >60)
GFR, EST NON AFRICAN AMERICAN: 40 mL/min — AB (ref >60)
Glucose, Bld: 97 mg/dL (ref 65–99)
POTASSIUM: 3.6 mmol/L (ref 3.5–5.1)
SODIUM: 136 mmol/L (ref 135–145)

## 2015-04-23 LAB — CBC
HEMATOCRIT: 32.7 % — AB (ref 36.0–46.0)
Hemoglobin: 11.3 g/dL — ABNORMAL LOW (ref 12.0–15.0)
MCH: 31.8 pg (ref 26.0–34.0)
MCHC: 34.6 g/dL (ref 30.0–36.0)
MCV: 92.1 fL (ref 78.0–100.0)
Platelets: 340 10*3/uL (ref 150–400)
RBC: 3.55 MIL/uL — AB (ref 3.87–5.11)
RDW: 13.8 % (ref 11.5–15.5)
WBC: 10.9 10*3/uL — AB (ref 4.0–10.5)

## 2015-04-23 LAB — MRSA PCR SCREENING: MRSA BY PCR: POSITIVE — AB

## 2015-04-23 MED ORDER — CHLORHEXIDINE GLUCONATE CLOTH 2 % EX PADS
6.0000 | MEDICATED_PAD | Freq: Every day | CUTANEOUS | Status: DC
  Administered 2015-04-24: 6 via TOPICAL

## 2015-04-23 MED ORDER — ENOXAPARIN SODIUM 40 MG/0.4ML ~~LOC~~ SOLN
40.0000 mg | SUBCUTANEOUS | Status: DC
  Administered 2015-04-23: 40 mg via SUBCUTANEOUS
  Filled 2015-04-23: qty 0.4

## 2015-04-23 MED ORDER — SODIUM CHLORIDE 0.9 % IV SOLN
INTRAVENOUS | Status: AC
  Administered 2015-04-23: 10:00:00 via INTRAVENOUS

## 2015-04-23 MED ORDER — SODIUM CHLORIDE 0.9 % IV SOLN: INTRAVENOUS | Status: AC

## 2015-04-23 MED ORDER — MUPIROCIN 2 % EX OINT
1.0000 "application " | TOPICAL_OINTMENT | Freq: Two times a day (BID) | CUTANEOUS | Status: DC
  Administered 2015-04-24: 1 via NASAL
  Filled 2015-04-23: qty 22

## 2015-04-23 MED ORDER — AMLODIPINE BESYLATE 5 MG PO TABS
5.0000 mg | ORAL_TABLET | Freq: Every day | ORAL | Status: DC
  Administered 2015-04-23 – 2015-04-24 (×2): 5 mg via ORAL
  Filled 2015-04-23 (×2): qty 1

## 2015-04-23 MED ORDER — LEVOTHYROXINE SODIUM 25 MCG PO TABS
125.0000 ug | ORAL_TABLET | Freq: Every day | ORAL | Status: DC
  Administered 2015-04-24: 125 ug via ORAL
  Filled 2015-04-23: qty 1

## 2015-04-23 MED ORDER — LOPERAMIDE HCL 2 MG PO CAPS: 2.0000 mg | ORAL_CAPSULE | Freq: Four times a day (QID) | ORAL | Status: DC | PRN

## 2015-04-23 MED ORDER — QUETIAPINE FUMARATE 25 MG PO TABS
25.0000 mg | ORAL_TABLET | Freq: Every day | ORAL | Status: DC
  Administered 2015-04-23: 25 mg via ORAL
  Filled 2015-04-23: qty 1

## 2015-04-23 NOTE — Care Management Note (Addendum)
Case Management Note  Patient Details  Name: Brandy Hicks MRN: [id number] Date of Birth: 01/18/1933  Subjective/Objective:   Adm w uti                 Action/Plan: lives w fam prior to snf for rehab per pt's nse, sw ref to be made   Expected Discharge Date:                  Expected Discharge Plan:     In-House Referral:     Discharge planning Services     Post Acute Care Choice:    Choice offered to:     DME Arranged:    DME Agency:     HH Arranged:    HH Agency:     Status of Service:     Medicare Important Message Given:    Date Medicare IM Given:    Medicare IM give by:    Date Additional Medicare IM Given:    Additional Medicare Important Message give by:     If discussed at Long Length of Stay Meetings, dates discussed:    Additional Comments: ur review done  Hanley Hays, RN 04/23/2015, 8:13 AM

## 2015-04-23 NOTE — Progress Notes (Signed)
Received report from Tonya.

## 2015-04-23 NOTE — Evaluation (Signed)
Physical Therapy Evaluation Patient Details Name: Brandy Hicks MRN: [id number] DOB: 1932-05-20 Today's Date: 04/23/2015   History of Present Illness  [name] is an 82 year old lady with history of Guillain-Barr syndrome, hypothyroidism, hypertension, and essential tremor presenting with somnolence and confusion. Three weeks ago, she was hospitalized at [facility] in [place] for Guillan Barr syndrome, where she underwent IVIG treatment and had a Foley catheter placed for the duration of her stay of one week. During her hospitalization, her husband died from a ruptured abdominal aortic aneurysm. After she was discharged, her granddaughter, who is her power of attorney, drove her up to [place] where she lives with her 2 kids. She was put in [facility] nursing home, where she has been for the last week. She has not been walking yet, but was mentating well and carry on full conversations during that time. Five days ago, however, she started acting confused and became very sleepy. She was complaining of intermittent lower back pain, but had no other complaints. 2 days later, she started having profuse watery, foul swelling diarrhea.   Clinical Impression  Pt admitted with above diagnosis. Pt currently with functional limitations due to the deficits listed below (see PT Problem List). Pt participated well with therapy after being awoken from deep sleep. Unaware of pt's baseline mobility or cognitive status at previous SNF. Pt will benefit from skilled PT to increase their independence and safety with mobility to allow discharge to the venue listed below.       Follow Up Recommendations SNF;Supervision/Assistance - 24 hour    Equipment Recommendations  Rolling walker with 5" wheels    Recommendations for Other Services OT consult     Precautions / Restrictions Precautions Precautions: Fall Restrictions Weight Bearing Restrictions: No      Mobility  Bed Mobility Overal bed  mobility: Needs Assistance Bed Mobility: Supine to Sit     Supine to sit: Mod assist        Transfers Overall transfer level: Needs assistance   Transfers: Stand Pivot Transfers;Sit to/from Stand Sit to Stand: Max assist Stand pivot transfers: Max assist          Ambulation/Gait                Stairs            Wheelchair Mobility    Modified Rankin (Stroke Patients Only)       Balance Overall balance assessment: Needs assistance Sitting-balance support: Bilateral upper extremity supported;Feet supported Sitting balance-Leahy Scale: Fair Sitting balance - Comments: required min assist for sitting balance until pt became more aroused then able to sit contact guard.  Postural control: Posterior lean Standing balance support: Bilateral upper extremity supported Standing balance-Leahy Scale: Zero Standing balance comment: Moderate to max assistance to remain upright.                              Pertinent Vitals/Pain Pain Assessment: No/denies pain    Home Living Family/patient expects to be discharged to:: Skilled nursing facility                      Prior Function Level of Independence: Needs assistance         Comments: Pt unable to provide history due to cognitive status     Hand Dominance        Extremity/Trunk Assessment   Upper Extremity Assessment: Generalized weakness  Lower Extremity Assessment: Generalized weakness      Cervical / Trunk Assessment: Kyphotic  Communication      Cognition Arousal/Alertness: Lethargic Behavior During Therapy: Impulsive;Flat affect Overall Cognitive Status: No family/caregiver present to determine baseline cognitive functioning       Memory: Decreased recall of precautions;Decreased short-term memory              General Comments General comments (skin integrity, edema, etc.): Pt in deep sleep upon entry to room, but easily aroused and plesant with  participation in therapy.     Exercises        Assessment/Plan    PT Assessment Patient needs continued PT services  PT Diagnosis Difficulty walking;Generalized weakness   PT Problem List Decreased strength;Decreased range of motion;Decreased activity tolerance;Decreased balance;Decreased mobility;Decreased cognition;Decreased safety awareness  PT Treatment Interventions Functional mobility training;Therapeutic activities;Therapeutic exercise;Balance training;Patient/family education   PT Goals (Current goals can be found in the Care Plan section) Acute Rehab PT Goals Patient Stated Goal: none stated PT Goal Formulation: Patient unable to participate in goal setting Time For Goal Achievement: 05/07/15 Potential to Achieve Goals: Fair    Frequency Min 2X/week   Barriers to discharge        Co-evaluation               End of Session Equipment Utilized During Treatment: Gait belt Activity Tolerance: Patient tolerated treatment well Patient left: in chair;with call bell/phone within reach;with chair alarm set;with nursing/sitter in room Nurse Communication: Mobility status         Time: 1610-9604 PT Time Calculation (min) (ACUTE ONLY): 17 min   Charges:   PT Evaluation $PT Eval Moderate Complexity: 1 Procedure     PT G Codes:       Everlean Cherry, SPT Everlean Cherry 04/23/2015, 4:15 PM

## 2015-04-23 NOTE — Progress Notes (Signed)
Brandy Hicks [id number] Admission Data: 04/23/2015 6:37 PM Attending Provider: Gardiner Barefoot, MD  PCP:No primary care provider on file. Consults/ Treatment Team:    Brandy Hicks is a 82 y.o. female patient admitted from ED awake, alert  & orientated  X 3,  Partial Code, VSS - Blood pressure 161/53, pulse 96, temperature 98.2 F (36.8 C), temperature source Oral, resp. rate 19, height  (1.626 m), weight 63.8 kg (140 lb 10.5 oz), SpO2 94 %., no c/o shortness of breath, no c/o chest pain, no distress noted  IV site WDL:  hand left, condition patent and no redness and wrist left, condition patent and no redness with a transparent dsg that's clean dry and intact.  Allergies:  No Known Allergies   Past Medical History  Diagnosis Date  . Hypertension   . Tremor   . Hyperlipidemia   . Hormone replacement therapy   . Osteoporosis   . Depression   . Hypothyroid   . Rosacea   . TIA (transient ischemic attack)   . Guillain Barr syndrome [facility])     History:  obtained from granddaughter and the patient. Tobacco/alcohol: denied none  Pt orientation to unit, room and routine. Information packet given to patient/family and safety video watched.  Admission INP armband ID verified with patient/family, and in place. SR up x 2, fall risk assessment complete with Patient and family verbalizing understanding of risks associated with falls. Pt verbalizes an understanding of how to use the call bell and to call for help before getting out of bed.  Skin, clean-dry- intact without evidence of bruising, or skin tears.   No evidence of skin break down noted on exam. Stage I on R elbow    Will cont to monitor and assist as needed.  Nupur Hohman, Gretta Cool, RN 04/23/2015 6:37 PM

## 2015-04-23 NOTE — Progress Notes (Signed)
Pt continuously trying to crawl out of bed, yelling at RN's, and becoming increasingly agitated. MD paged and ordered a one time haldol and a PRN klonopin. Will continue to monitor.

## 2015-04-23 NOTE — Progress Notes (Signed)
Patient ID: Brandy Hicks, female   DOB: 05/28/1932, 82 y.o.   MRN: [id number]   Subjective: [name] had a rough night; she's delirious, thinks the year is 59, she's in [place], and has been trying to get out of bed.   Objective: Vital signs in last 24 hours: Filed Vitals:   04/23/15 0600 04/23/15 0700 04/23/15 0800 04/23/15 0900  BP: 110/88 137/95 178/42 162/58  Pulse: 109 110 104 103  Temp:      TempSrc:      Resp: Height:      Weight:      SpO2: 94% 96% 95% 92%   Physical exam: General: resting in bed but looking around in a delirious state, trying to get out bed Cardiac: regular rate and rhythm, no rubs, murmurs or gallops Pulm: breathing well, clear to auscultation bilaterally Abd: bowel sounds normal, soft, nondistended, non-tender Ext: warm and well perfused, without pedal edema Neuro: alert and oriented to place only, cranial nerves II-XII grossly intact, moving all extremities well  Lab Results: Basic Metabolic Panel:  Recent Labs Lab 04/22/15 1446 04/23/15 0219  NA 132* 136  K 3.6 3.6  CL 98* 101  CO2 22 20*  GLUCOSE 95 97  BUN 50* 42*  CREATININE 1.50* 1.24*  CALCIUM 7.2* 7.1*   CBC:  Recent Labs Lab 04/22/15 1446 04/23/15 0219  WBC 8.5 10.9*  NEUTROABS 6.9  --   HGB 11.3* 11.3*  HCT 32.5* 32.7*  MCV 91.8 92.1  PLT 295 340   CBG:  Recent Labs Lab 04/22/15 1527  GLUCAP 106*   Urinalysis:  Recent Labs Lab 04/22/15 1529  COLORURINE RED*  LABSPEC 1.020  PHURINE 5.0  GLUCOSEU NEGATIVE  HGBUR NEGATIVE  BILIRUBINUR LARGE*  KETONESUR 15*  PROTEINUR 30*  NITRITE POSITIVE*  LEUKOCYTESUR MODERATE*    Micro Results: Recent Results (from the past 240 hour(s))  C difficile quick scan w PCR reflex     Status: None   Collection Time: 04/22/15  2:34 PM  Result Value Ref Range Status   C Diff antigen NEGATIVE NEGATIVE Final   C Diff toxin NEGATIVE NEGATIVE Final   C Diff interpretation Negative for toxigenic  C. difficile  Final  MRSA PCR Screening     Status: Abnormal   Collection Time: 04/22/15  9:52 PM  Result Value Ref Range Status   MRSA by PCR POSITIVE (A) NEGATIVE Final    Comment:        The GeneXpert MRSA Assay (FDA approved for NASAL specimens only), is one component of a comprehensive MRSA colonization surveillance program. It is not intended to diagnose MRSA infection nor to guide or monitor treatment for MRSA infections. RESULT CALLED TO, READ BACK BY AND VERIFIED WITH: RN Curtis Sites [id number]  THANEY    Studies/Results: Dg Chest Portable 1 View  04/22/2015  CLINICAL DATA:  Altered mental status for 3 days.  Former smoker. EXAM: PORTABLE CHEST 1 VIEW COMPARISON:  02/27/2007 FINDINGS: The heart size and mediastinal contours are within normal limits. Both lungs are clear. The visualized skeletal structures are unremarkable. IMPRESSION: No active disease. Electronically Signed   By: Elige Ko   On: 04/22/2015 15:09   Medications: I have reviewed the patient's current medications. Scheduled Meds: . antiseptic oral rinse  7 mL Mouth Rinse BID  . cefTRIAXone (ROCEPHIN)  IV  1 g Intravenous Q24H  . [START ON 04/24/2015] Chlorhexidine Gluconate Cloth  6 each Topical Q0600  . enoxaparin (LOVENOX)  injection  30 mg Subcutaneous Q24H  . [START ON 04/24/2015] mupirocin ointment  1 application Nasal BID   Continuous Infusions:  PRN Meds:.  Assessment/Plan:  Delirium: It's hard to say whether this is medication-induced from all of her psychogenic medications or from an underlying urinary tract infection. We'll continue to hold her psychogenic medications and treat her UTI with ceftriaxone. We'll transfer her out of step-down and start quetiapine tonight if she starts sundowning. -Hold oxycontin, ambien, clonazepam, divalproex, primidone -Continue ceftriaxone -Started quetiapine  at night -Sitter to re-orient -Stopped cardiac monitoring -Continue NS at  75cc/hr  Guillan-barre syndrome: She was treated at [facility] with IVIG three weeks ago but we can't get access to the records. The granddaughter tells [place] she still is not able to walk. -Consulted PT, OT, and speech therapy  Acute kidney injury: Her creatinine is improving with fluids; this appears to be pre-renal. -Continue hydration per above  Profuse watery diarrhea: C diff toxin was negative so we'll resume ceftriaxone for her UTI. I suspect this is most likely viral. -Continue loperamide   Hypothyroidism: We'll continue her home dose of levothyroxine -Continue levothyroxine daily  Hypertension: Pressures look better so we'll resume her home amlodipine. -Re-started amlodipine  daily -Holding clonidine 0.1mg  twice daily  Mood disorder: We'll hold her depakote. -Holding depakote  twice daily  Essential tremor: We'll hold her primidone as this can cause drowsiness. -Holding primidone  twice daily  Dispo: Disposition is deferred at this time, awaiting improvement of current medical problems.  Anticipated discharge in approximately 2-4 day(s).   The patient does not have a current PCP (No primary care provider on file.) and does need an [facility] hospital follow-up appointment after discharge.  The patient does have transportation limitations that hinder transportation to clinic appointments.  .Services Needed at time of discharge: Y = Yes, Blank = No PT:   OT:   RN:   Equipment:   Other:     LOS: 1 day   Brandy Cooley, MD 04/23/2015, 9:37 AM

## 2015-04-24 DIAGNOSIS — R400 Somnolence: Principal | ICD-10-CM

## 2015-04-24 DIAGNOSIS — F4321 Adjustment disorder with depressed mood: Secondary | ICD-10-CM

## 2015-04-24 LAB — CBC
HEMATOCRIT: 31.7 % — AB (ref 36.0–46.0)
Hemoglobin: 10.7 g/dL — ABNORMAL LOW (ref 12.0–15.0)
MCH: 30.4 pg (ref 26.0–34.0)
MCHC: 33.8 g/dL (ref 30.0–36.0)
MCV: 90.1 fL (ref 78.0–100.0)
PLATELETS: 358 10*3/uL (ref 150–400)
RBC: 3.52 MIL/uL — AB (ref 3.87–5.11)
RDW: 14.2 % (ref 11.5–15.5)
WBC: 9.6 10*3/uL (ref 4.0–10.5)

## 2015-04-24 LAB — TSH: TSH: 1.941 u[IU]/mL (ref 0.350–4.500)

## 2015-04-24 LAB — BASIC METABOLIC PANEL
Anion gap: 15 (ref 5–15)
BUN: 19 mg/dL (ref 6–20)
CHLORIDE: 106 mmol/L (ref 101–111)
CO2: 17 mmol/L — AB (ref 22–32)
CREATININE: 0.81 mg/dL (ref 0.44–1.00)
Calcium: 7.5 mg/dL — ABNORMAL LOW (ref 8.9–10.3)
GFR calc Af Amer: >60 mL/min (ref >60)
GFR calc non Af Amer: >60 mL/min (ref >60)
Glucose, Bld: 66 mg/dL (ref 65–99)
POTASSIUM: 3.4 mmol/L — AB (ref 3.5–5.1)
Sodium: 138 mmol/L (ref 135–145)

## 2015-04-24 LAB — URINE CULTURE
Culture: NO GROWTH
SPECIAL REQUESTS: NORMAL

## 2015-04-24 MED ORDER — LOPERAMIDE HCL 2 MG PO CAPS: 4.0000 mg | ORAL_CAPSULE | Freq: Four times a day (QID) | ORAL | Status: DC | PRN

## 2015-04-24 MED ORDER — PRIMIDONE 50 MG PO TABS: 50.0000 mg | ORAL_TABLET | Freq: Every day | ORAL | Status: DC

## 2015-04-24 MED ORDER — QUETIAPINE FUMARATE 25 MG PO TABS: 25.0000 mg | ORAL_TABLET | Freq: Every evening | ORAL | Status: DC | PRN

## 2015-04-24 MED ORDER — ACETAMINOPHEN 500 MG PO TABS: 500.0000 mg | ORAL_TABLET | Freq: Four times a day (QID) | ORAL | Status: DC | PRN

## 2015-04-24 NOTE — Progress Notes (Signed)
Lawanda Cousins Widener to be D/C'd to home, then SNF per MD order.  Discussed with the patient and all questions fully answered.  VSS, Skin clean, dry and intact without evidence of skin break down, no evidence of skin tears noted. IV catheter discontinued intact. Site without signs and symptoms of complications. Dressing and pressure applied.  An After Visit Summary was printed and given to the patient. Patient received prescriptions.  Patient instructed to return to ED, call 911, or call MD for any changes in condition.   Patient escorted via PTAR, and D/C home for the night. Pt. Will go to SNF tomorrow.  Theressa Stamps 04/24/2015 4:35 PM

## 2015-04-24 NOTE — Clinical Social Work Note (Signed)
Clinical Social Work Assessment  Patient Details  Name: Brandy Hicks MRN: [id number] Date of Birth: 1932-06-05  Date of referral:  04/24/15               Reason for consult:  Facility Placement                Permission sought to share information with:  Facility Medical sales representative, Family Supports Permission granted to share information::  No (Patient disoriented; completed assessment with daughter)  Name::     Brandy Hicks  Agency::  [facility]  Relationship::  Advertising account executive Information:  [phone]  Housing/Transportation Living arrangements for the past 2 months:  Skilled Nursing Facility Source of Information:  Other (Comment Required) Publishing rights manager ) Patient Interpreter Needed:  None Criminal Activity/Legal Involvement Pertinent to Current Situation/Hospitalization:  No - Comment as needed Significant Relationships:  Other Family Members Lives with:  Self Do you feel safe going back to the place where you live?  Yes Need for family participation in patient care:  Yes (Comment)  Care giving concerns:  CSW received consult regarding returning patient to SNF at discharge. Patient is disoriented so CSW completed assessment w/ granddaughter.    Social Worker assessment / plan:  Patient is from [facility] SNF (patient's things are there still) and would like to return there, but [facility] does not have a bed available today. Patient will discharge home with family for tonight and then, per [facility], will be able to return to [facility] tomorrow.  Employment status:  Retired Health and safety inspector:  Medicare PT Recommendations:  Skilled Nursing Facility Information / Referral to community resources:  Skilled Nursing Facility  Patient/Family's Response to care:  Patient's granddaughter expressed understanding of the situation and requests PTAR to transport patient home with DNR.  Patient/Family's Understanding of and Emotional Response to Diagnosis, Current Treatment, and  Prognosis:  No questions/concerns.  Emotional Assessment Appearance:  Appears stated age Attitude/Demeanor/Rapport:  Unable to Assess Affect (typically observed):  Unable to Assess Orientation:  Oriented to Self, Oriented to Place Alcohol / Substance use:  Not Applicable Psych involvement (Current and /or in the community):  No (Comment)  Discharge Needs  Concerns to be addressed:  No discharge needs identified Readmission within the last 30 days:  No Current discharge risk:  None Barriers to Discharge:  No Barriers Identified   Brandy Hicks, LCSWA 04/24/2015, 1:11 PM

## 2015-04-24 NOTE — Discharge Summary (Signed)
Name: Brandy Hicks MRN: [id number] DOB: June 25, 1932 82 y.o. PCP: No primary care provider on file.  Date of Admission: 04/22/2015  1:59 PM Date of Discharge: 04/24/2015 Attending Physician: Gardiner Barefoot, MD  Discharge Diagnosis: 1. Somnolence from polypharmacy  2. Adjustment disorder with depressed mood 3. Guillan-Barre syndrome 4. Hypothyroidism  Discharge Medications:   Medication List    STOP taking these medications        clonazePAM 0.5 MG tablet  Commonly known as:  KLONOPIN     cloNIDine 0.1 MG tablet  Commonly known as:  CATAPRES     diclofenac 50 MG EC tablet  Commonly known as:  VOLTAREN     divalproex 125 MG capsule  Commonly known as:  DEPAKOTE SPRINKLE     oxyCODONE 20 mg 12 hr tablet  Commonly known as:  OXYCONTIN     sodium chloride 0.9 % Soln     zolpidem 5 MG tablet  Commonly known as:  AMBIEN      TAKE these medications        acetaminophen 500 MG tablet  Commonly known as:  TYLENOL  Take 1 tablet (500 mg total) by mouth every 6 (six) hours as needed for moderate pain.     amLODipine 5 MG tablet  Commonly known as:  NORVASC  Take 5 mg by mouth daily.     levothyroxine 125 MCG tablet  Commonly known as:  SYNTHROID, LEVOTHROID  Take 125 mcg by mouth daily before breakfast.     loperamide 2 MG tablet  Commonly known as:  IMODIUM A-D  Take 2 mg by mouth 4 (four) times daily as needed for diarrhea or loose stools.     primidone 50 MG tablet  Commonly known as:  MYSOLINE  Take 1 tablet (50 mg total) by mouth daily.     QUEtiapine 25 MG tablet  Commonly known as:  SEROQUEL  Take 1 tablet (25 mg total) by mouth at bedtime as needed.       Disposition and follow-up:   Ms.Brandy Hicks was discharged from [facility] in Stable condition.  At the hospital follow up visit please address:  1. Avoid psychotropic medications  2. Consider referral to psychiatry should her depression continue  3. Ensures she  follows with neurology for her Guillan Barre syndrome  4. Follow up TSH  Procedures Performed:  Dg Chest Portable 1 View  04/22/2015  CLINICAL DATA:  Altered mental status for 3 days.  Former smoker. EXAM: PORTABLE CHEST 1 VIEW COMPARISON:  02/27/2007 FINDINGS: The heart size and mediastinal contours are within normal limits. Both lungs are clear. The visualized skeletal structures are unremarkable. IMPRESSION: No active disease. Electronically Signed   By: Brandy Hicks   On: 04/22/2015 15:09    Admission HPI:   [name] is an 82 year old lady with history of Guillain-Barr syndrome, hypothyroidism, hypertension, and essential tremor presenting with somnolence and confusion.  The patient herself is unable to provide the history, she is confused and lethargic on exam, so the history was obtained by her granddaughter, who is her healthcare power of attorney.  Three weeks ago, she was hospitalized at [facility] in [place] for Guillan Barr syndrome, where she underwent IVIG treatment and had a Foley catheter placed for the duration of her stay of one week. During her hospitalization, her husband died from a ruptured abdominal aortic aneurysm. After she was discharged, her granddaughter, who is her power of attorney, drove her up to  [place] where she lives with her 2 kids. She was put in [facility] nursing home, where she has been for the last week. She has not been walking yet, but was mentating well and carry on full conversations during that time.  Five days ago, however, she started acting confused and became very sleepy. She was complaining of intermittent lower back pain, but had no other complaints. 2 days later, she started having profuse watery, foul swelling diarrhea. She did not receive any antibiotics during her stay at [facility], to the granddaughter's knowledge.  I was not able to complete a review of systems as the patient was altered and unable to provide a history.  Hospital  Course by problem list:  1. Somnolence and confusion from polypharmacy: She presented with a 5 day history of progressive confusion and somnolence since moving to a [facility]. At the SNF, her Depakote dosage was increased, she was started on ambien nightly, and was also receiving oxycontin, klonazepam, and primidone. Her mental status improved dramatically after holding these medications for a day. She was delirious at night so we started quetiapine as needed. Her urinalysis on admission was equivocal for a urinary tract infection, but given her history of Foley catheter in the last week, she was given 3 doses of ceftriaxone. She did not complain of dysuria or frequency and did not have a leukocytosis, fever, so we stopped ceftriaxone and did not discharge her with an antibiotic. We held most of her psychotropics upon discharge per above.  2. Asymptomatic bacteriuria: Urinalysis was concerning for bacterial infection per above but we decided against continuing antibiotics beyond 3 doses of ceftriaxone as she was clinically asymptomatic and her altered mental status resolved quickly after we stopped her psychotropic medications.  2. Adjustment disorder with depressed mood: Her boyfriend died 3 weeks ago from a ruptured aortic aneurysm. Since then, she has been feeling depressed, but says she has good family support and is hopeful about feeling better. She was on Depakote upon admission, but denies any history of seizure or bipolar disorder, so this was discontinued. We recommend she follow with a psychiatrist upon discharge. Going forward, should she continue to be depressed, I think she is a good candidate for an SSRI.  3. Guillan-Barre syndrome: She was diagnosed with Deon Barr syndrome at [facility] in [place] 3 weeks ago, presumptively induced by a viral illness as this is preceded by acute bronchitis. Records show she had a lumbar puncture that did not show GBS, but she was treated empirically  with IVIG. Her lower extremity paralysis has been gradually improving since then. We will refer her to neurology for further evaluation upon discharge.  4. Hypothyroidism: We continued her home dose of levothyroxine 125 g daily. A TSH was ordered, but has not yet resulted upon discharge, will need to be followed up.  Discharge Vitals:   BP 162/65 mmHg  Pulse 104  Temp(Src) 98.4 F (36.9 C) (Oral)  Resp 16  Ht  (1.626 m)  Wt 63.8 kg (140 lb 10.5 oz)  BMI 24.13 kg/m2  SpO2 95%  Discharge Labs:  No results found for this or any previous visit (from the past 24 hour(s)).  Signed: Selina Cooley, MD 04/24/2015, 10:12 AM

## 2015-04-24 NOTE — [place] FL2 (Signed)
[place] MEDICAID FL2 LEVEL OF CARE SCREENING TOOL     IDENTIFICATION  Patient Name: Brandy Hicks Birthdate: Aug 03, 1932 Sex: female Admission Date (Current Location): 04/22/2015  [facility] and [place] Number:  Producer, television/film/video and Address:  [facility], [address], [place], [place] [address]      Provider Number: [id number]  Attending Physician Name and Address:  Gardiner Barefoot, MD  Relative Name and Phone Number:  Verlin Fester, [phone]    Current Level of Care: Hospital Recommended Level of Care: Skilled Nursing Facility Prior Approval Number:    Date Approved/Denied:   PASRR Number: [id number]  Discharge Plan: SNF    Current Diagnoses: Patient Active Problem List   Diagnosis Date Noted  . Altered mental status   . UTI (urinary tract infection) 04/22/2015  . Acute kidney injury (HCC) 04/22/2015  . Essential hypertension, benign 11/28/2013    Orientation RESPIRATION BLADDER Height & Weight     Self, Place  Normal Continent Weight: 140 lb 10.5 oz (63.8 kg) Height:   (162.6 cm)  BEHAVIORAL SYMPTOMS/MOOD NEUROLOGICAL BOWEL NUTRITION STATUS      Incontinent (Rectal tube)  (Please see DC summary)  AMBULATORY STATUS COMMUNICATION OF NEEDS Skin   Extensive Assist Verbally Normal                       Personal Care Assistance Level of Assistance  Bathing, Feeding, Dressing Bathing Assistance: Maximum assistance Feeding assistance: Limited assistance Dressing Assistance: Limited assistance     Functional Limitations Info             SPECIAL CARE FACTORS FREQUENCY  PT (By licensed PT)     PT Frequency: min 2x/week              Contractures      Additional Factors Info  Psychotropic Code Status Info: Partial Allergies Info: NKA Psychotropic Info: Seroquel   Isolation Precautions Info: MRSA     Current Medications (04/24/2015):  This is the current hospital active medication  list Current Facility-Administered Medications  Medication Dose Route Frequency Provider Last Rate Last Dose  . amLODipine (NORVASC) tablet 5 mg  5 mg Oral Daily Selina Cooley, MD   5 mg at 04/24/15 2841  . antiseptic oral rinse (CPC / CETYLPYRIDINIUM CHLORIDE 0.05%) solution 7 mL  7 mL Mouth Rinse BID Doneen Poisson, MD   7 mL at 04/23/15 2211  . Chlorhexidine Gluconate Cloth 2 % PADS 6 each  6 each Topical Q0600 Gardiner Barefoot, MD   6 each at 04/24/15 [phone]  . enoxaparin (LOVENOX) injection 40 mg  40 mg Subcutaneous Q24H Gardiner Barefoot, MD   40 mg at 04/23/15 2014  . levothyroxine (SYNTHROID, LEVOTHROID) tablet 125 mcg  125 mcg Oral QAC breakfast Selina Cooley, MD   125 mcg at 04/24/15 [phone]  . loperamide (IMODIUM) capsule 4 mg  4 mg Oral QID PRN Selina Cooley, MD      . mupirocin ointment (BACTROBAN) 2 % 1 application  1 application Nasal BID Gardiner Barefoot, MD   1 application at 04/24/15 [phone]  . QUEtiapine (SEROQUEL) tablet 25 mg  25 mg Oral QHS Selina Cooley, MD   25 mg at 04/23/15 2211     Discharge Medications: Please see discharge summary for a list of discharge medications.  Relevant Imaging Results:  Relevant Lab Results:   Additional Information SSN: [id number] [place], [place]

## 2015-04-24 NOTE — Progress Notes (Signed)
Patient ID: Brandy Hicks, female   DOB: August 23, 1932, 82 y.o.   MRN: [id number]   Subjective: [name] is remarkably more cogent today than yesterday. She tells me she feels fine physically but she's depressed about the death of her boyfriend. They were together for four years and he died there weeks ago from a ruptured aortic aneurysm. She had been in a psychiatric hospital in her 34s because she felt "so sad." She did not want to see a psychiatrist but felt she had adequate support from her granddaughter and family. They were in the room and agreed she is back to her baseline. I also reviewed her SNF records more closely. Her depakote level had been increased and she was getting Ambien nightly, in addition to klonazepam and opiates.  Objective: Vital signs in last 24 hours: Filed Vitals:   04/23/15 2000 04/23/15 2112 04/24/15 0505 04/24/15 0531  BP: 153/46 168/91 189/57 162/65  Pulse: 86 98 104   Temp:  98.9 F (37.2 C) 98.4 F (36.9 C)   TempSrc:  Oral Oral   Resp:  16 16   Height:      Weight:      SpO2:  97% 95%    Physical exam: General: resting in bed but , with mild essential tremor, much more lucid today than yesterday Cardiac: regular rate and rhythm, no rubs, murmurs or gallops Pulm: breathing well, clear to auscultation bilaterally Abd: bowel sounds normal, soft, nondistended, non-tender, rectal tube with watery diarrhea Ext: warm and well perfused, without pedal edema Neuro: alert and oriented x 3, knows how many quarters in a dollar, cranial nerves II-XII grossly intact, moving all extremities well  Medications: I have reviewed the patient's current medications. Scheduled Meds: . amLODipine  5 mg Oral Daily  . antiseptic oral rinse  7 mL Mouth Rinse BID  . cefTRIAXone (ROCEPHIN)  IV  1 g Intravenous Q24H  . Chlorhexidine Gluconate Cloth  6 each Topical Q0600  . enoxaparin (LOVENOX) injection  40 mg Subcutaneous Q24H  . levothyroxine  125 mcg Oral QAC  breakfast  . mupirocin ointment  1 application Nasal BID  . QUEtiapine  25 mg Oral QHS   Continuous Infusions:  PRN Meds:.loperamide   Assessment/Plan:  Delirium: Vastly improved today. It's hard to say whether this is medication-induced from all of her psychogenic medications, from an underlying urinary tract infection, or depression from her recent boyfriend's death.Maryclare Labrador continue to hold her psychogenic medications and stop antibiotics. -Hold oxycontin, ambien, clonazepam, divalproex, primidone -Stopped ceftriaxone -Continue quetiapine  at night  Adjustment disorder with depressed mood: Her boyfriend died 2 weeks ago from a ruptured aortic aneurysm. She seems to have an expected amount of grief, has good insight, and is hopeful about feeling better. We'll continue holding her depakote. We'll check a TSH level to ensure she's therapeutic. She'll need psychiatric outpatient follow-up. -Recommend outpatient psychiatric evaluation upon discharge -Ordered TSH -Holding depakote  Profuse watery diarrhea: C diff toxin was negative. I'm not sure of the cause; perhaps she has a viral gastroenteritis, or depakote was the culprit. -Continue loperamide  Hypothyroidism: We'll continue her home dose of levothyroxine and check a TSH. 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 The granddaughter tells [place] she still is not able to walk. -Consulted PT  Hypertension: Pressures look better so we'll resume her home amlodipine. -Re-started amlodipine  daily -  Holding clonidine 0.1mg  twice daily  Essential tremor: We'll hold her primidone as this can cause drowsiness. -Holding primidone  twice daily  Dispo: Disposition is deferred at this time, awaiting improvement of current medical problems.  Anticipated discharge in approximately 1-2 day(s).   The patient  does not have a current PCP (No primary care provider on file.) and does need an [facility] hospital follow-up appointment after discharge.  The patient does have transportation limitations that hinder transportation to clinic appointments.  .Services Needed at time of discharge: Y = Yes, Blank = No PT:   OT:   RN:   Equipment:   Other:     LOS: 2 days   Selina Cooley, MD 04/24/2015, 7:51 AM

## 2015-04-24 NOTE — Progress Notes (Signed)
Patient will DC to: Patient's home Anticipated DC date: 04/24/15 Family notified: Media planner by: PTAR 2pm  CSW signing off.  Cristobal Goldmann, [place] Clinical Social Worker [phone]

## 2015-04-27 LAB — CULTURE, BLOOD (ROUTINE X 2)
CULTURE: NO GROWTH
Culture: NO GROWTH

## 2015-05-02 ENCOUNTER — Ambulatory Visit (INDEPENDENT_AMBULATORY_CARE_PROVIDER_SITE_OTHER): Payer: Medicare Other | Admitting: Neurology

## 2015-05-02 ENCOUNTER — Telehealth: Payer: Self-pay | Admitting: *Deleted

## 2015-05-02 ENCOUNTER — Encounter: Payer: Self-pay | Admitting: Neurology

## 2015-05-02 VITALS — BP 164/68 | HR 77 | Ht 64.0 in | Wt 135.4 lb

## 2015-05-02 DIAGNOSIS — G250 Essential tremor: Secondary | ICD-10-CM | POA: Diagnosis not present

## 2015-05-02 DIAGNOSIS — F410 Panic disorder [episodic paroxysmal anxiety] without agoraphobia: Secondary | ICD-10-CM | POA: Diagnosis not present

## 2015-05-02 DIAGNOSIS — Z8669 Personal history of other diseases of the nervous system and sense organs: Secondary | ICD-10-CM

## 2015-05-02 DIAGNOSIS — F411 Generalized anxiety disorder: Secondary | ICD-10-CM

## 2015-05-02 NOTE — Telephone Encounter (Signed)
Release faxed to [facility] requesting all records and labs.

## 2015-05-02 NOTE — Progress Notes (Signed)
[facility]    Provider:  Dr Jaynee Eagles Referring Provider: Loleta Chance, MD Primary Care Physician:  No primary care provider on file.  CC:  GBS  HPI:  Brandy Hicks is a 82 y.o. female here as a referral from Dr. Melburn Hake for possible GBS. PMHx AMS, UTI, AKF, somnolence, essential tremor, anxiety, chronic LBP, HTN, panic attacks, B12 deficiency.  Patient is quite anxious and crying in the office, Patient says she lived with a man for 8 years, when she met him she knew he had an aneurysm and he recently died. She is here with her daughter who provides most information. Patient had bronchitis this past January. Mother wasn't taking care of herself,  she thought she was having a stroke. This is in [place]. She was admitted for severe respiratory infection. After she was discharged, her legs felt very funny like she could not put pressure on them. She drove an hour and she stepped out of her car and collapsed. She was admitted and worked up for stroke and discharged, Dxed with sciatica at [facility]. She couldn't move and they brought her back to the hospital, she couldn't walk and took her back to a different hospital. She was in a [facility] and maybe [facility], story is not clear and I don't have records. [facility] in [place] ([place] [facility]) Dxed with GBS treated with IVIG. She was diagnosed by lumbar puncture and emg/ncs. Patient is crying in the office today, she appears very distraught. She reports resultant numbness, tingling, burning in the feet. She was paralyzed, she couldn't move from the waist down at all, catheter was placed as she could not urinate. She is improving. She can walk although she is scared of falling. Weakness has significantly improved. She still has an uncomfortable feeling in her feet of heaviness and feeling cold however she would not describe it as pain. She complains of no other focal neurologic deficits. She is having intense anxiety, panic  attacks, depression and crying. No difficulty breathing, no shortness of breath, no double vision, no difficulty swallowing, no vision changes, no speech difficulties, no symptoms in the upper extremities or in the face.  Reviewed notes, labs and imaging from outside physicians, which showed: Reviewed records from [facility]. Patient is an 81 year old woman who was recently admitted to Hospital in [place] for unclear reasons and was transferred to the skilled nursing facility for rehabilitation on 04/12/2015. She had been admitted to Hospital in [place] on January 10 with a complaint of bilateral lower extremity weakness and numbness in both feet. She was discharged home after workup did not show acute significant findings however she presented to the hospital again on January 15 with paralysis and paresthesias. She had MRI scans did not show acute abnormalities as well as a lumbar puncture. She was felt to have Guillain-Barr syndrome although an EMG and nerve conduction velocity study could not be performed in the hospital and was treated with IV immunoglobulin for this. Her significant other was recently hospitalized at [facility] for cardiac surgery and is not able to care for her. She was transferred to the skilled nursing facility for rehabilitation. She is having significant problems with anxiety and panic attacks.  Here with daughter who provides mos information: [name] [phone]  POA: Linday Profitt [phone] and Smitty Cords [phone] granddaughters  Thyroid 1.941. BMP with potassium 3.4, CO2 17 and calcium of 7.5 otherwise normal. CBC with anemia 10.7/ 31.7. Valproic acid level 20.  Review of Systems: Patient complains of symptoms per HPI as well as the following symptoms: Fatigue, memory loss, confusion, numbness, weakness, insomnia, sleepiness, tremor, joint swelling, depression, anxiety, not in sleep, decreased energy, change in appetite, racing thoughts.  Pertinent negatives per HPI. All others negative.   Social History   Social History  . Marital Status: Married    Spouse Name: N/A  . Number of Children: N/A  . Years of Education: N/A   Occupational History  . Not on file.   Social History Main Topics  . Smoking status: Former Research scientist (life sciences)  . Smokeless tobacco: Never Used  . Alcohol Use: No  . Drug Use: No  . Sexual Activity: No   Other Topics Concern  . Not on file   Social History Narrative    Family History  Problem Relation Age of Onset  . Stroke Mother   . Hypertension Father   . CAD Father   . Prostate cancer Father     Past Medical History  Diagnosis Date  . Hypertension   . Tremor   . Hyperlipidemia   . Hormone replacement therapy   . Osteoporosis   . Depression   . Hypothyroid   . Rosacea   . TIA (transient ischemic attack)   . Guillain Barr syndrome [facility])     Past Surgical History  Procedure Laterality Date  . Tonsillectomy    . Heel spur surgery    . Facial cosmetic surgery      Current Outpatient Prescriptions  Medication Sig Dispense Refill  . acetaminophen (TYLENOL) 500 MG tablet Take 1 tablet (500 mg total) by mouth every 6 (six) hours as needed for moderate pain. 30 tablet 0  . Amino Acids-Protein Hydrolys (FEEDING SUPPLEMENT, PRO-STAT SUGAR FREE 64,) LIQD Take 30 mLs by mouth 2 (two) times daily.    Marland Kitchen amLODipine (NORVASC) 5 MG tablet Take 5 mg by mouth daily.    Marland Kitchen aspirin 81 MG tablet Take 81 mg by mouth daily.    Marland Kitchen atorvastatin (LIPITOR) 40 MG tablet Take 40 mg by mouth daily.    . Cholecalciferol (VITAMIN D3) 2000 units capsule Take 2,000 Units by mouth daily.    . clonazePAM (KLONOPIN) 0.5 MG tablet Take 0.5 mg by mouth 3 (three) times daily as needed for anxiety.    . cloNIDine (CATAPRES) 0.1 MG tablet Take 0.1 mg by mouth 2 (two) times daily.    . Cyanocobalamin (VITAMIN B 12 PO) Take 2,000 mcg by mouth daily.    . diclofenac (VOLTAREN) 50 MG EC tablet Take 50 mg by mouth every 8  (eight) hours.    . divalproex (DEPAKOTE SPRINKLE) 125 MG capsule Take 125 mg by mouth at bedtime.    . enoxaparin (LOVENOX) 60 MG/0.6ML injection Inject 60 mg into the skin every 12 (twelve) hours.    Marland Kitchen escitalopram (LEXAPRO) 10 MG tablet Take 10 mg by mouth daily.    Marland Kitchen levothyroxine (SYNTHROID, LEVOTHROID) 125 MCG tablet Take 125 mcg by mouth daily before breakfast.    . lisinopril (PRINIVIL,ZESTRIL) 20 MG tablet Take 20 mg by mouth daily.    Marland Kitchen loperamide (IMODIUM A-D) 2 MG tablet Take 2 mg by mouth 4 (four) times daily as needed for diarrhea or loose stools.    Marland Kitchen omeprazole (PRILOSEC) 20 MG capsule Take 20 mg by mouth 2 (two) times daily.    . ondansetron (ZOFRAN-ODT) 4 MG disintegrating tablet Take 4 mg by mouth 2 (two) times daily as needed for nausea or vomiting.    Marland Kitchen  oxyCODONE (OXYCONTIN) 20 mg 12 hr tablet Take 20 mg by mouth 2 (two) times daily.    Marland Kitchen oxyCODONE-acetaminophen (PERCOCET/ROXICET) 5-325 MG tablet Take by mouth every 6 (six) hours as needed for severe pain.    . polyethylene glycol (MIRALAX / GLYCOLAX) packet Take 17 g by mouth daily.    . primidone (MYSOLINE) 50 MG tablet Take 1 tablet (50 mg total) by mouth daily. 30 tablet 0  . propranolol (INDERAL) 10 MG tablet Take 10 mg by mouth 2 (two) times daily.    . QUEtiapine (SEROQUEL) 25 MG tablet Take 1 tablet (25 mg total) by mouth at bedtime as needed. 30 tablet 0  . senna (SENOKOT) 8.6 MG tablet Take 1 tablet by mouth 2 (two) times daily.    . vancomycin (VANCOCIN) 50 mg/mL oral solution Take 125 mg by mouth every 4 (four) hours.    Marland Kitchen zolpidem (AMBIEN) 5 MG tablet Take 5 mg by mouth at bedtime as needed for sleep.     No current facility-administered medications for this visit.    Allergies as of 05/02/2015  . (No Known Allergies)    Vitals: BP 164/68 mmHg  Pulse 77  Ht 5' 4"  (1.626 m)  Wt 135 lb 6.4 oz (61.417 kg)  BMI 23.23 kg/m2 Last Weight:  Wt Readings from Last 1 Encounters:  05/02/15 135 lb 6.4 oz (61.417  kg)   Last Height:   Ht Readings from Last 1 Encounters:  05/02/15 5' 4"  (1.626 m)   Physical exam: Exam: Gen: anxious, crying                   CV: RRR, no MRG. No Carotid Bruits. +peripheral edema, warm, nontender Eyes: Conjunctivae clear without exudates or hemorrhage  Neuro: Detailed Neurologic Exam  Speech:    Speech is normal; fluent and spontaneous with normal comprehension.  Cognition:    The patient is oriented to person, place,date, day  and time;     recent and remote memory appear intact;     language fluent;     normal attention, concentration,     fund of knowledge appears intact, had a conversation about politics and the current administration Cranial Nerves:    The pupils are equal, round, and reactive to light. The fundi are normal and spontaneous venous pulsations are present. Visual fields are full to finger confrontation. Extraocular movements are intact. Trigeminal sensation is intact and the muscles of mastication are normal. The face is symmetric. The palate elevates in the midline. Hearing intact. Voice is normal. Shoulder shrug is normal. The tongue has normal motion without fasciculations.   Coordination:    Normal finger to nose and heel to shin. Normal rapid alternating movements.   Gait:Needs minimal assistance to stand and walk, is scared and needs to hold hands.      Motor Observation: essential    Patient has a high frequency low amplitude tremor likely essential tremor as per history Tone:    Normal muscle tone.    Posture:    Posture is normal. normal erect    Strength: mild bilateral hip flexion weakness otherwise trength is V/V in the upper and lower limbs.      Sensation: intact to LT, pin prick distally. Impaired proprioception and vibration distally in the feet.      Reflex Exam:  DTR's: Absent AJs otherwise deep tendon reflexes in the upper and lower extremities are normal bilaterally.   Toes:    The toes are equivocal  bilaterally.   Clonus:    Clonus is absent.       Assessment/Plan:   82 y.o. female here as a referral from Dr. Melburn Hake for possible GBS. PMHx AMS, UTI, AKF, somnolence, essential tremor, anxiety, chronic LBP, HTN, panic attacks, B12 deficiency.  By report patient was diagnosed with Guillain-Barr syndrome last month in [place] and treated with IVIG. I don't have any records from hospitalization in [place]. We'll request records from the multiple hospitals she was admitted to. Per daughter, patient was diagnosed by lumbar puncture and EMG nerve conduction study. Notes from [facility] state EMG nerve conduction study was never performed. Patient's exam is fairly good considering per report from daughter and patient she was completely paralyzed from the waist down and couldn't move. Today her strength is largely intact, she can stand unassisted and walk however she is very cautious and scared, sensation is impaired distally in proprioception and vibration, she has absent ankle jerks but other reflexes are normal. I can't comment on the possible GBS diagnosis until I review all the records, the MRIs as well as a lumbar puncture and EMG nerve conduction study if they were performed. Regardless though patient appears to be improving and if it was GBS there would be no further treatment at this time except for physical therapy and follow-up in neurology. From a neurologic perspective, daughter asked me to comment on whether patient could be discharged from the nursing home to he rhouse. As far as GBS goes, patient appears to be quite improved if this was the diagnosis and there is no current reason to keep her in the nursing home for GBS if she could get physical therapy at home. However I cannot comment on patient's other conditions and I would also defer to physical therapy evaluation at [facility] as they have more thoroughly evaluated patient in reference to her ability to perform her own activities of daily  living in the home and her gait an dsafety as well as defer to the nursing home counselors to ensure that patient is safe at home and has all the resources she needs before discharge. I will review all records and follow with patient in 3 months.   Sarina Ill, MD  [facility] [address] [place], [place] [address]  Phone [phone] Fax [phone]

## 2015-05-02 NOTE — Patient Instructions (Signed)
Remember to drink plenty of fluid, eat healthy meals and do not skip any meals. Try to eat protein with a every meal and eat a healthy snack such as fruit or nuts in between meals. Try to keep a regular sleep-wake schedule and try to exercise daily, particularly in the form of walking, 20-30 minutes a day, if you can.   I would like to see you back in 3 months, sooner if we need to. Please call us with any interim questions, concerns, problems, updates or refill requests.   Our phone number is [phone]. We also have an after hours call service for urgent matters and there is a physician on-call for urgent questions. For any emergencies you know to call 911 or go to the nearest emergency room

## 2015-05-03 ENCOUNTER — Telehealth: Payer: Self-pay | Admitting: *Deleted

## 2015-05-03 ENCOUNTER — Encounter: Payer: Self-pay | Admitting: *Deleted

## 2015-05-03 NOTE — Progress Notes (Signed)
Faxed office note to [facility] for their records from Dr Lucia Gaskins. Fax: [phone]. Received confirmation.

## 2015-05-03 NOTE — Telephone Encounter (Signed)
Records from [facility] on [place] desk.

## 2015-05-03 NOTE — Telephone Encounter (Signed)
Receive a fax from [facility] that the patient could not be locate in the system.

## 2015-07-30 ENCOUNTER — Encounter: Payer: Self-pay | Admitting: Neurology

## 2015-07-30 ENCOUNTER — Ambulatory Visit (INDEPENDENT_AMBULATORY_CARE_PROVIDER_SITE_OTHER): Payer: Medicare Other | Admitting: Neurology

## 2015-07-30 VITALS — BP 159/69 | HR 78 | Ht 64.0 in | Wt 130.6 lb

## 2015-07-30 DIAGNOSIS — G610 Guillain-Barre syndrome: Secondary | ICD-10-CM

## 2015-07-30 DIAGNOSIS — G250 Essential tremor: Secondary | ICD-10-CM

## 2015-07-30 MED ORDER — GABAPENTIN 300 MG PO CAPS: 300.0000 mg | ORAL_CAPSULE | Freq: Three times a day (TID) | ORAL | Status: DC

## 2015-07-30 MED ORDER — PROPRANOLOL HCL ER 60 MG PO CP24: 60.0000 mg | ORAL_CAPSULE | Freq: Every day | ORAL | Status: DC

## 2015-07-30 NOTE — Patient Instructions (Addendum)
Remember to drink plenty of fluid, eat healthy meals and do not skip any meals. Try to eat protein with a every meal and eat a healthy snack such as fruit or nuts in between meals. Try to keep a regular sleep-wake schedule and try to exercise daily, particularly in the form of walking, 20-30 minutes a day, if you can.   As far as your medications are concerned, I would like to suggest: try propranolol at bedtime 60mg . Try it for 2 weeks. If that doesn't help taking neurontin 300mg  three times day.   As far as diagnostic testing: None today  I would like to see you back as needed, sooner if we need to. Please call us with any interim questions, concerns, problems, updates or refill requests.   Our phone number is [phone]. We also have an after hours call service for urgent matters and there is a physician on-call for urgent questions. For any emergencies you know to call 911 or go to the nearest emergency room

## 2015-07-30 NOTE — Progress Notes (Signed)
[facility]    Provider:  Dr Jaynee Eagles Referring Provider: No ref. provider found Primary Care Physician:  Hayden Rasmussen., MD  CC: GBS  Interval history 07/30/2015; She is 100% better. Walking well, doing well.  She takes 214m of primidone and it is helping with tremor. She drinks very little caffeine during the day. Propranolol did not help. She takes 4 in the morning and 4 in the afternoon. Her thyroid has been checked. She is on 4072mtwice a day. She says propranolol did not help. She has had tremors for years. The tremors started 10-15 years ago. Slowly worsening. No known family history. She has tried primidone and propranolol in the past, Not Neurontin or gabapentin. The tremor is worsening. She started Zoloft in February or march and she has been on other antidepressants in the past but doesn't think it is associated with that medication. Stres makes the tremor worse. It is continuous. If the propranolol doesn;t work try Neurontin. If she has to carry a coffee cup she spills. Writing is very difficult. Voice and chin affected. Right arm worse than left.   HPI: Brandy Hicks a [age].o. female here as a referral from Dr. FlMelburn Hakeor possible GBS. PMHx AMS, UTI, AKF, somnolence, essential tremor, anxiety, chronic LBP, HTN, panic attacks, B12 deficiency. Patient is quite anxious and crying in the office, Patient says she lived with a man for 4075ears, when she met him she knew he had an aneurysm and he recently died. She is here with her daughter who provides most information. Patient had bronchitis this past January. Mother wasn't taking care of herself, she thought she was having a stroke. [place]. After she was discharged, her legs felt very funny like she could not put pressure on them. She drove an hour and she stepped out of her car and collapsed. She was admitted and worked up for stroke and  discharged, [facility] to the hospital, she couldn't walk and took her back to a different hospital. [facility], story is not clear and I don't have records. [facility] [place] Dxed with GBS treated with IVIG. She was diagnosed by lumbar puncture and emg/ncs. Patient is crying in the office today, she appears very distraught. She reports resultant numbness, tingling, burning in the feet. She was paralyzed, she couldn't move from the waist down at all, catheter was placed as she could not urinate. She is improving. She can walk although she is scared of falling. Weakness has significantly improved. She still has an uncomfortable feeling in her feet of heaviness and feeling cold however she would not describe it as pain. She complains of no other focal neurologic deficits. She is having intense anxiety, panic attacks, depression and crying. No difficulty breathing, no shortness of breath, no double vision, no difficulty swallowing, no vision changes, no speech difficulties, no symptoms in the upper extremities or in the face.  Reviewed notes, labs and imaging from outside physicians, which showed: [facility] Patient is an [age]ear old woman who was [place] and was transferred to the skilled nursing facility for rehabilitation on 04/12/2015. [place] had [place] with a complaint of bilateral lower extremity weakness and numbness in both feet.  She was discharged home after workup did not show acute significant findings however she presented to the hospital again on January 15 with paralysis and paresthesias. She had MRI scans did not show acute abnormalities as well as a lumbar puncture. 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 Her significant other was recently hospitalized at [facility] for cardiac surgery and is not able to care for her. She was transferred to the skilled nursing facility for rehabilitation. She is having significant problems with anxiety and panic attacks.  Here with daughter who provides mos information: Brandy Hicks [phone]  POA: Brandy Hicks [phone] and Brandy Hicks [phone] granddaughters  Thyroid 1.941. BMP with potassium 3.4, CO2 17 and calcium of 7.5 otherwise normal. CBC with anemia 10.7/ 31.7. Valproic acid level 20.    Review of Systems: Patient complains of symptoms per HPI as well as the following symptoms: Fatigue, memory loss, confusion, numbness, weakness, insomnia, sleepiness, tremor, joint swelling, depression, anxiety, not in sleep, decreased energy, change in appetite, racing thoughts. Pertinent negatives per HPI. All others negative.   Social History   Social History  . Marital Status: Married    Spouse Name: N/A  . Number of Children: N/A  . Years of Education: N/A   Occupational History  . Not on file.   Social History Main Topics  . Smoking status: Former Research scientist (life sciences)  . Smokeless tobacco: Never Used  . Alcohol Use: No  . Drug Use: No  . Sexual Activity: No   Other Topics Concern  . Not on file   Social History Narrative   Lives at [facility] in [place]   Fax: [phone]    Family History  Problem Relation Age of Onset  . Stroke Mother   . Hypertension Father   . CAD Father   . Prostate cancer Father     Past Medical History  Diagnosis Date  . Hypertension   . Tremor   . Hyperlipidemia   . Hormone replacement therapy   . Osteoporosis   . Depression   . Hypothyroid   . Rosacea   . TIA (transient ischemic attack)   . Guillain Barr syndrome [facility])     Past Surgical History  Procedure Laterality  Date  . Tonsillectomy    . Heel spur surgery    . Facial cosmetic surgery      Current Outpatient Prescriptions  Medication Sig Dispense Refill  . acetaminophen (TYLENOL) 500 MG tablet Take 1 tablet (500 mg total) by mouth every 6 (six) hours as needed for moderate pain. 30 tablet 0  . amLODipine (NORVASC) 5 MG tablet Take 5 mg by mouth daily.    Marland Kitchen aspirin 81 MG tablet Take 81 mg by mouth daily.    Marland Kitchen atorvastatin (LIPITOR) 40 MG tablet Take 40 mg by mouth daily.    . calcium carbonate (TUMS - DOSED IN MG ELEMENTAL CALCIUM) 500 MG chewable tablet Chew 1 tablet by mouth daily.    . chlorthalidone (HYGROTON) 25 MG tablet 50 mg daily.  0  . Cholecalciferol (VITAMIN D3) 2000 units capsule Take 2,000 Units by mouth daily.    . Melatonin 3 MG TABS Take 3 mg by mouth at bedtime.    . primidone (MYSOLINE) 50 MG tablet Take 1 tablet (50 mg total) by mouth daily. 30 tablet 0  . QUEtiapine (  SEROQUEL) 25 MG tablet Take 1 tablet (25 mg total) by mouth at bedtime as needed. 30 tablet 0  . sertraline (ZOLOFT) 50 MG tablet 50 mg daily.  0   No current facility-administered medications for this visit.    Allergies as of 07/30/2015  . (No Known Allergies)    Vitals: Ht _0  (1.626 m)  Wt 130 lb 9.6 oz (59.24 kg)  BMI 22.41 kg/m2 Last Weight:  Wt Readings from Last 1 Encounters:  07/30/15 130 lb 9.6 oz (59.24 kg)   Last Height:   Ht Readings from Last 1 Encounters:  07/30/15 _1  (1.626 m)     Exam: Gen: NAD  CV: RRR, no MRG. No Carotid Bruits. +peripheral edema, warm, nontender Eyes: Conjunctivae clear without exudates or hemorrhage  Neuro: Detailed Neurologic Exam  Speech:  Speech is normal; fluent and spontaneous with normal comprehension.  Cognition:  The patient is oriented to person, place,date, day and time;   recent and remote memory appear intact;   language fluent;   normal attention, concentration,   fund of knowledge appears intact, had a  conversation about politics and the current administration Cranial Nerves:  The pupils are equal, round, and reactive to light. The fundi are normal and spontaneous venous pulsations are present. Visual fields are full to finger confrontation. Extraocular movements are intact. Trigeminal sensation is intact and the muscles of mastication are normal. The face is symmetric. The palate elevates in the midline. Hearing intact. Voice is normal. Shoulder shrug is normal. The tongue has normal motion without fasciculations.   Coordination:  Normal finger to nose and heel to shin. Normal rapid alternating movements.   Gait : normal    Motor Observation: essential  Patient has a high frequency low amplitude tremor likely essential tremor as per history, also in the voice and head. Tone:  Normal muscle tone.   Posture:  Posture is normal. normal erect   Strength: 5/5   Sensation: intact to LT, pin prick distally. Impaired proprioception and vibration distally in the feet.    Reflex Exam:  DTR's: Absent AJs otherwise deep tendon reflexes in the upper and lower extremities are normal bilaterally.  Toes:  The toes are equivocal bilaterally.  Clonus:  Clonus is absent.      Assessment/Plan: 82 y.o. female here as a referral from Dr. Melburn Hake for possible GBS. PMHx AMS, UTI, AKF, somnolence, essential tremor, anxiety, chronic LBP, HTN, panic attacks, B12 deficiency.She is completely improved. Reviewed notes from OSH and her Lp was c/w GBS. But she is completely recovered. She has an essential tremor treated by Dr. Erling Cruz years ago. Has tried primidone, low-dose propranolol (64m tid). I want to try her on a higher dose of propranolol as well as neurontin. She is resistant to the propranolol even when I explain she was on a low does. I'll lincrease it to 60ER and also provide neurontin with oinstructions. If tremor does not improve with propranolol or neurontin i  [facility]    Assessment/Plan:    ASarina Ill MD  [facility] [address] [id number] Phone [phone] [phone] A total of 30 minutes was spent face-to-face with this patient. Over half this time was spent on counseling patient on the GBS and Essential tremor diagnosis and different diagnostic and therapeutic options available.

## 2015-07-31 DIAGNOSIS — G610 Guillain-Barre syndrome: Secondary | ICD-10-CM | POA: Insufficient documentation

## 2015-12-03 ENCOUNTER — Ambulatory Visit: Payer: Medicare Other | Admitting: Neurology

## 2016-07-06 ENCOUNTER — Other Ambulatory Visit: Payer: Self-pay | Admitting: Neurology

## 2016-07-06 DIAGNOSIS — G250 Essential tremor: Secondary | ICD-10-CM

## 2016-07-15 ENCOUNTER — Encounter: Payer: Self-pay | Admitting: Sports Medicine

## 2016-07-15 ENCOUNTER — Ambulatory Visit (INDEPENDENT_AMBULATORY_CARE_PROVIDER_SITE_OTHER): Payer: Medicare Other | Admitting: Sports Medicine

## 2016-07-15 DIAGNOSIS — B351 Tinea unguium: Secondary | ICD-10-CM | POA: Diagnosis not present

## 2016-07-15 DIAGNOSIS — M79674 Pain in right toe(s): Secondary | ICD-10-CM

## 2016-07-15 DIAGNOSIS — M79675 Pain in left toe(s): Secondary | ICD-10-CM

## 2016-07-15 NOTE — Progress Notes (Signed)
   Subjective:    Patient ID: Brandy Hicks, female    DOB: 1932/05/26, 83 y.o.   MRN: [id number]  HPI    Review of Systems  Musculoskeletal: Positive for arthralgias.  All other systems reviewed and are negative.      Objective:   Physical Exam        Assessment & Plan:

## 2016-07-15 NOTE — Progress Notes (Signed)
Subjective: Brandy Hicks is a 83 y.o. female patient seen today in office with complaint of painful thickened and elongated 1st toenails; unable to trim. Patient denies history of Diabetes, Neuropathy, or Vascular disease. Admits to history GBS. Patient has no other pedal complaints at this time.   Patient Active Problem List   Diagnosis Date Noted  . GBS (Guillain-Barre syndrome) (HCC) 07/31/2015  . History of Guillain-Barre syndrome 05/02/2015  . Anxiety state 05/02/2015  . Panic attack 05/02/2015  . Essential tremor 05/02/2015  . Altered mental status   . UTI (urinary tract infection) 04/22/2015  . Acute kidney injury (HCC) 04/22/2015  . Essential hypertension, benign 11/28/2013    Current Outpatient Prescriptions on File Prior to Visit  Medication Sig Dispense Refill  . amLODipine (NORVASC) 5 MG tablet Take 10 mg by mouth daily.     Marland Kitchen aspirin 81 MG tablet Take 81 mg by mouth daily.    Marland Kitchen atorvastatin (LIPITOR) 40 MG tablet Take 40 mg by mouth daily.    . calcium carbonate (TUMS - DOSED IN MG ELEMENTAL CALCIUM) 500 MG chewable tablet Chew 1 tablet by mouth daily.    . chlorthalidone (HYGROTON) 25 MG tablet 50 mg daily.  0  . Cholecalciferol (VITAMIN D3) 2000 units capsule Take 2,000 Units by mouth daily.    Marland Kitchen gabapentin (NEURONTIN) 300 MG capsule Take 1 capsule (300 mg total) by mouth 3 (three) times daily. 90 capsule 11  . Melatonin 3 MG TABS Take 3 mg by mouth at bedtime.    . primidone (MYSOLINE) 50 MG tablet Take 1 tablet (50 mg total) by mouth daily. (Patient taking differently: Take 100 mg by mouth daily. ) 30 tablet 0  . propranolol ER (INDERAL LA) 60 MG 24 hr capsule Take 1 capsule (60 mg total) by mouth at bedtime. 30 capsule 12  . QUEtiapine (SEROQUEL) 25 MG tablet Take 1 tablet (25 mg total) by mouth at bedtime as needed. 30 tablet 0  . sertraline (ZOLOFT) 50 MG tablet 50 mg daily.  0   No current facility-administered medications on file prior to visit.     No  Known Allergies  Objective: Physical Exam  General: Well developed, nourished, no acute distress, awake, alert and oriented x 3  Vascular: Dorsalis pedis artery 2/4 bilateral, Posterior tibial artery 1/4 bilateral, skin temperature warm to warm proximal to distal bilateral lower extremities, no varicosities, pedal hair present bilateral.  Neurological: Gross sensation present via light touch bilateral.   Dermatological: Skin is warm, dry, and supple bilateral, Bilateral hallux nails are tender, long, thick, and discolored with moderate subungal debris and distal lifting, no webspace macerations present bilateral, no open lesions present bilateral, no callus/corns/hyperkeratotic tissue present bilateral. No signs of infection bilateral.  Musculoskeletal: No symptomatic boney deformities noted bilateral. Muscular strength within normal limits without painon range of motion. No pain with calf compression bilateral.  Assessment and Plan:  Problem List Items Addressed This Visit    None    Visit Diagnoses    Dermatophytosis of nail    -  Primary   Toe pain, bilateral         -Examined patient.  -Discussed treatment options for painful mycotic nails. -Patient declined nail procedure -Mechanically debrided and reduced mycotic nails with sterile nail nipper and dremel nail file without incident. -Patient to return in 3 months for follow up evaluation or sooner if symptoms worsen.  Asencion Islam, DPM

## 2016-07-22 ENCOUNTER — Ambulatory Visit (INDEPENDENT_AMBULATORY_CARE_PROVIDER_SITE_OTHER): Payer: Medicare Other | Admitting: Neurology

## 2016-07-22 ENCOUNTER — Encounter: Payer: Self-pay | Admitting: Neurology

## 2016-07-22 VITALS — Ht 61.5 in | Wt 148.4 lb

## 2016-07-22 DIAGNOSIS — G250 Essential tremor: Secondary | ICD-10-CM

## 2016-07-22 NOTE — Progress Notes (Signed)
[facility]    Provider:  Dr Brandy Hicks Referring Provider: Hayden Rasmussen, MD Primary Care Physician:  Brandy Rasmussen, MD  CC: Essential tremor  Interval history: Patient returns with worsening essential tremor. We've tried primidone and she is on 400 mg a day. She is on propranolol do not want to increase due to bradycardia. She is also on Neurontin. We could consider increasing this dose is however given her significant head, voice and arm tremor I do think that she needs to be evaluated for deep brain stimulation.  Interval history 07/30/2015; She drinks very little caffeine during the day.  Her thyroid has been checked. She is on primidone 476m twice a day. She has had tremors for years. The tremors started 10-15 years ago. Slowly worsening. No known family history. She has tried primidone and propranolol in the past, Not Neurontin or gabapentin. The tremor is worsening. She started Zoloft in February or march and she has been on other antidepressants in the past but doesn't think it is associated with that medication. Stres makes the tremor worse. It is continuous. If the propranolol doesn;t work try Neurontin. If she has to carry a coffee cup she spills. Writing is very difficult. Voice and chin affected. Right arm worse than left.   HPI: Brandy Hicks a 83y.o. female here as a referral from Dr. FMelburn Hakefor possible GBS. PMHx AMS, UTI, AKF, somnolence, essential tremor, anxiety, chronic LBP, HTN, panic attacks, B12 deficiency. Patient is quite anxious and crying in the office, Patient says she lived with a man for 430years, when she met him she knew he had an aneurysm and he recently died. She is here with her daughter who provides most information. Patient had bronchitis this past January. Mother wasn't taking care of herself, she thought she was having a stroke. [place] for severe respiratory infection. After she was discharged,  her legs felt very funny like she could not put pressure on them. She drove an hour and she stepped out of her car and collapsed. She was admitted and worked up for stroke and discharged, [facility] She couldn't move and they brought her back to the hospital, she couldn't walk and took her back to a different hospital. [place], story is not clear and I don't have records. [facility] [place] Dxed with GBS treated with IVIG. She was diagnosed by lumbar puncture and emg/ncs. Patient is crying in the office today, she appears very distraught. She reports resultant numbness, tingling, burning in the feet. She was paralyzed, she couldn't move from the waist down at all, catheter was placed as she could not urinate. She is improving. She can walk although she is scared of falling. Weakness has significantly improved. She still has an uncomfortable feeling in her feet of heaviness and feeling cold however she would not describe it as pain. She complains of no other focal neurologic deficits. She is having intense anxiety, panic attacks, depression and crying. No difficulty breathing, no shortness of breath, no double vision, no difficulty swallowing, no vision changes, no speech difficulties, no symptoms in the upper extremities or in the face.  Reviewed notes, labs and imaging from outside physicians, which showed: [facility]. Patient is an 83year old woman who [place] and was transferred to the skilled nursing  facility for rehabilitation on 04/12/2015. She had been admitted to Hospital in [place] on January 10 with a complaint of bilateral lower extremity weakness and numbness in both feet. She was discharged home after workup did not show acute significant findings however she presented to the hospital again on January 15 with paralysis and  paresthesias. She had MRI scans did not show acute abnormalities as well as a lumbar puncture. She was felt to have Guillain-Barr syndrome although an EMG and nerve conduction velocity study could not be performed in the hospital and was treated with IV immunoglobulin for this. Her significant other was recently hospitalized at [facility] for cardiac surgery and is not able to care for her. She was transferred to the skilled nursing facility for rehabilitation. She is having significant problems with anxiety and panic attacks.  Here with daughter who provides mos information: Brandy Hicks [phone]  POA: Brandy Hicks [phone] and Smitty Cords [phone] granddaughters  Thyroid 1.941. BMP with potassium 3.4, CO2 17 and calcium of 7.5 otherwise normal. CBC with anemia 10.7/ 31.7. Valproic acid level 20.    Review of Systems: Patient complains of symptoms per HPI as well as the following symptoms: Fatigue, memory loss, confusion, numbness, weakness, insomnia, sleepiness, tremor, joint swelling, depression, anxiety, not in sleep, decreased energy, change in appetite, racing thoughts. Pertinent negatives per HPI. All others negative.   Social History   Social History  . Marital status: Married    Spouse name: N/A  . Number of children: N/A  . Years of education: N/A   Occupational History  . Not on file.   Social History Main Topics  . Smoking status: Former Research scientist (life sciences)  . Smokeless tobacco: Never Used  . Alcohol use No  . Drug use: No  . Sexual activity: No   Other Topics Concern  . Not on file   Social History Narrative   Lives at alone in an appartment    Family History  Problem Relation Age of Onset  . Stroke Mother   . Hypertension Father   . CAD Father   . Prostate cancer Father     Past Medical History:  Diagnosis Date  . Depression   . Guillain Barr syndrome ([place])   . Hormone replacement therapy   . Hyperlipidemia   . Hypertension   .  Hypothyroid   . Osteoporosis   . Rosacea   . TIA (transient ischemic attack)   . Tremor     Past Surgical History:  Procedure Laterality Date  . FACIAL COSMETIC SURGERY    . HEEL SPUR SURGERY    . TONSILLECTOMY      Current Outpatient Prescriptions  Medication Sig Dispense Refill  . amLODipine (NORVASC) 10 MG tablet Take 10 mg by mouth daily.     Marland Kitchen aspirin 81 MG tablet Take 81 mg by mouth daily.    Marland Kitchen atorvastatin (LIPITOR) 40 MG tablet Take 40 mg by mouth daily.    . calcium carbonate (TUMS - DOSED IN MG ELEMENTAL CALCIUM) 500 MG chewable tablet Chew 1 tablet by mouth daily.    . chlorthalidone (HYGROTON) 25 MG tablet 50 mg daily.  0  . Cholecalciferol (VITAMIN D3) 2000 units capsule Take 2,000 Units by mouth daily.    Marland Kitchen gabapentin (NEURONTIN) 300 MG capsule Take 1 capsule (300 mg total) by mouth 3 (three) times daily. 90 capsule 11  . levothyroxine (SYNTHROID, LEVOTHROID) 125 MCG tablet     . losartan (COZAAR) 100 MG tablet   0  .  Melatonin 3 MG TABS Take 3 mg by mouth at bedtime.    . Multiple Vitamins-Minerals (MULTIVITAMIN ADULT PO) Take 1 tablet by mouth daily.    . primidone (MYSOLINE) 50 MG tablet Take 1 tablet (50 mg total) by mouth daily. (Patient taking differently: Take 100 mg by mouth daily. ) 30 tablet 0  . propranolol ER (INDERAL LA) 60 MG 24 hr capsule Take 1 capsule (60 mg total) by mouth at bedtime. 30 capsule 12  . QUEtiapine (SEROQUEL) 25 MG tablet Take 1 tablet (25 mg total) by mouth at bedtime as needed. 30 tablet 0  . sertraline (ZOLOFT) 50 MG tablet 50 mg daily.  0   No current facility-administered medications for this visit.     Allergies as of 07/22/2016  . (No Known Allergies)    Vitals: Ht 5' 1.5" (1.562 m)   Wt 148 lb 6.4 oz (67.3 kg)   BMI 27.59 kg/m  Last Weight:  Wt Readings from Last 1 Encounters:  07/22/16 148 lb 6.4 oz (67.3 kg)   Last Height:   Ht Readings from Last 1 Encounters:  07/22/16 5' 1.5" (1.562 m)    Speech:  Speech  is normal; fluent and spontaneous with normal comprehension.  Cognition:  The patient is oriented to person, place,date, day and time;   recent and remote memory appear intact;   language fluent;   normal attention, concentration,   fund of knowledge appears intact, had a conversation about politics and the current administration Cranial Nerves:  The pupils are equal, round, and reactive to light. The fundi are normal and spontaneous venous pulsations are present. Visual fields are full to finger confrontation. Extraocular movements are intact. Trigeminal sensation is intact and the muscles of mastication are normal. The face is symmetric. The palate elevates in the midline. Hearing intact. Voice is normal. Shoulder shrug is normal. The tongue has normal motion without fasciculations.   Coordination:  Normal finger to nose and heel to shin. Normal rapid alternating movements.   Gait : normal    Motor Observation: essential  Patient has a high frequency low amplitude tremor likely essential tremor as per history, also significant tremor in the voice and head. Tone:  Normal muscle tone.   Posture:  Posture is normal. normal erect   Strength: 5/5   Sensation: intact to LT, pin prick distally. Impaired proprioception and vibration distally in the feet.    Reflex Exam:  DTR's: Absent AJs otherwise deep tendon reflexes in the upper and lower extremities are normal bilaterally.  Toes:  The toes are equivocal bilaterally.  Clonus:  Clonus is absent.      Assessment/Plan: 83 y.o. female who looks younger than stated age without memory changes and very active here for worsening significant essential tremor in the limbs as well as in the voice and in the head.. Has tried primidone, propranolol, neurontin. Discussed today that we can continue to increase these medications and maybe add Klonopin however given the significance of  her tremor especially in her head and voice I do not think medication management will make a significant improvement and I recommend evaluation for deep brain stimulation at [facility] with Dr. Linus Mako.   Orders Placed This Encounter  Procedures  . Ambulatory referral to Neurology      Sarina Ill, MD  [facility] [address] [place], [place] [address]  Phone [phone] Fax [phone]  A total of 15 minutes was spent face-to-face with this patient. Over half this  time was spent on counseling patient on the essential tre,or diagnosis and different diagnostic and therapeutic options available.

## 2016-07-24 ENCOUNTER — Other Ambulatory Visit: Payer: Self-pay | Admitting: Neurology

## 2016-07-24 DIAGNOSIS — G250 Essential tremor: Secondary | ICD-10-CM

## 2016-09-23 ENCOUNTER — Ambulatory Visit: Payer: Medicare Other | Admitting: Sports Medicine

## 2016-09-30 ENCOUNTER — Ambulatory Visit (INDEPENDENT_AMBULATORY_CARE_PROVIDER_SITE_OTHER): Payer: Medicare Other | Admitting: Sports Medicine

## 2016-09-30 DIAGNOSIS — M79674 Pain in right toe(s): Secondary | ICD-10-CM

## 2016-09-30 DIAGNOSIS — M79675 Pain in left toe(s): Secondary | ICD-10-CM | POA: Diagnosis not present

## 2016-09-30 DIAGNOSIS — B351 Tinea unguium: Secondary | ICD-10-CM | POA: Diagnosis not present

## 2016-09-30 NOTE — Progress Notes (Signed)
Subjective: Brandy Hicks is a 83 y.o. female patient seen today in office with complaint of painful thickened toenails; unable to trim. Patient states that her nails are doing well not sure why she is here because had nails pedicure 3-4 weeks ago. Patient has no other pedal complaints at this time.     Patient Active Problem List   Diagnosis Date Noted  . GBS (Guillain-Barre syndrome) (HCC) 07/31/2015  . History of Guillain-Barre syndrome 05/02/2015  . Anxiety state 05/02/2015  . Panic attack 05/02/2015  . Essential tremor 05/02/2015  . Altered mental status   . UTI (urinary tract infection) 04/22/2015  . Acute kidney injury (HCC) 04/22/2015  . Essential hypertension, benign 11/28/2013    Current Outpatient Prescriptions on File Prior to Visit  Medication Sig Dispense Refill  . amLODipine (NORVASC) 10 MG tablet Take 10 mg by mouth daily.     Marland Kitchen aspirin 81 MG tablet Take 81 mg by mouth daily.    Marland Kitchen atorvastatin (LIPITOR) 40 MG tablet Take 40 mg by mouth daily.    Marland Kitchen CALCIUM PO Take 125 mg by mouth daily.    . chlorthalidone (HYGROTON) 25 MG tablet 50 mg daily.  0  . Cholecalciferol (VITAMIN D3) 2000 units capsule Take 2,000 Units by mouth daily.    Marland Kitchen gabapentin (NEURONTIN) 300 MG capsule Take 1 capsule (300 mg total) by mouth 3 (three) times daily. (Patient not taking: Reported on 07/22/2016) 90 capsule 11  . levothyroxine (SYNTHROID, LEVOTHROID) 125 MCG tablet     . losartan (COZAAR) 100 MG tablet   0  . Melatonin 3 MG TABS Take 3 mg by mouth at bedtime.    . Multiple Vitamins-Minerals (MULTIVITAMIN ADULT PO) Take 1 tablet by mouth daily.    . primidone (MYSOLINE) 50 MG tablet Take 1 tablet (50 mg total) by mouth daily. (Patient taking differently: Take 100 mg by mouth daily. ) 30 tablet 0  . propranolol ER (INDERAL LA) 60 MG 24 hr capsule TAKE ONE CAPSULE BY MOUTH EVERY NIGHT AT BEDTIME 90 capsule 3  . QUEtiapine (SEROQUEL) 25 MG tablet Take 1 tablet (25 mg total) by mouth at  bedtime as needed. 30 tablet 0  . sertraline (ZOLOFT) 50 MG tablet 50 mg daily.  0   No current facility-administered medications on file prior to visit.     No Known Allergies  Objective: Physical Exam  General: Well developed, nourished, no acute distress, awake, alert and oriented x 3  Vascular: Dorsalis pedis artery 2/4 bilateral, Posterior tibial artery 1/4 bilateral, skin temperature warm to warm proximal to distal bilateral lower extremities, no varicosities, pedal hair present bilateral.  Neurological: Gross sensation present via light touch bilateral.   Dermatological: Skin is warm, dry, and supple bilateral, Bilateral hallux nails are short, thick, and polished with moderate subungal debris, all other nails are mildly elongated and minimal debris, no webspace macerations present bilateral, no open lesions present bilateral, no callus/corns/hyperkeratotic tissue present bilateral. No signs of infection bilateral.  Musculoskeletal: No symptomatic boney deformities noted bilateral. Muscular strength within normal limits without painon range of motion. No pain with calf compression bilateral.  Assessment and Plan:  Problem List Items Addressed This Visit    None    Visit Diagnoses    Dermatophytosis of nail    -  Primary   Toe pain, bilateral         -Examined patient.  -Discussed treatment options for painful mycotic nails. -Mechanically debrided and reduced mycotic nails with sterile  nail nipper and dremel nail file without incident. -Patient to return in 3 months for follow up evaluation or sooner if symptoms worsen.  Asencion Islamitorya Shanon Becvar, DPM

## 2016-12-30 ENCOUNTER — Encounter: Payer: Self-pay | Admitting: Sports Medicine

## 2016-12-30 ENCOUNTER — Ambulatory Visit (INDEPENDENT_AMBULATORY_CARE_PROVIDER_SITE_OTHER): Payer: Medicare Other | Admitting: Sports Medicine

## 2016-12-30 DIAGNOSIS — M79674 Pain in right toe(s): Secondary | ICD-10-CM | POA: Diagnosis not present

## 2016-12-30 DIAGNOSIS — M79675 Pain in left toe(s): Secondary | ICD-10-CM | POA: Diagnosis not present

## 2016-12-30 DIAGNOSIS — B351 Tinea unguium: Secondary | ICD-10-CM | POA: Diagnosis not present

## 2016-12-30 NOTE — Progress Notes (Signed)
Subjective: Brandy Hicks is a 83 y.o. female patient seen today in office with complaint of painful thickened toenails; unable to trim. Patient reports that she will have brain stimulation done to help correct tremor. Patient denies any acute pain in toes, denies has no other pedal complaints at this time.     Patient Active Problem List   Diagnosis Date Noted  . GBS (Guillain-Barre syndrome) (HCC) 07/31/2015  . History of Guillain-Barre syndrome 05/02/2015  . Anxiety state 05/02/2015  . Panic attack 05/02/2015  . Essential tremor 05/02/2015  . Altered mental status   . UTI (urinary tract infection) 04/22/2015  . Acute kidney injury (HCC) 04/22/2015  . Essential hypertension, benign 11/28/2013    Current Outpatient Prescriptions on File Prior to Visit  Medication Sig Dispense Refill  . amLODipine (NORVASC) 10 MG tablet Take 10 mg by mouth daily.     Marland Kitchen aspirin 81 MG tablet Take 81 mg by mouth daily.    Marland Kitchen atorvastatin (LIPITOR) 40 MG tablet Take 40 mg by mouth daily.    Marland Kitchen CALCIUM PO Take 125 mg by mouth daily.    . chlorthalidone (HYGROTON) 25 MG tablet 50 mg daily.  0  . Cholecalciferol (VITAMIN D3) 2000 units capsule Take 2,000 Units by mouth daily.    Marland Kitchen gabapentin (NEURONTIN) 300 MG capsule Take 1 capsule (300 mg total) by mouth 3 (three) times daily. 90 capsule 11  . levothyroxine (SYNTHROID, LEVOTHROID) 125 MCG tablet     . losartan (COZAAR) 100 MG tablet   0  . Melatonin 3 MG TABS Take 3 mg by mouth at bedtime.    . Multiple Vitamins-Minerals (MULTIVITAMIN ADULT PO) Take 1 tablet by mouth daily.    . primidone (MYSOLINE) 50 MG tablet Take 1 tablet (50 mg total) by mouth daily. (Patient taking differently: Take 100 mg by mouth daily. ) 30 tablet 0  . propranolol ER (INDERAL LA) 60 MG 24 hr capsule TAKE ONE CAPSULE BY MOUTH EVERY NIGHT AT BEDTIME 90 capsule 3  . QUEtiapine (SEROQUEL) 25 MG tablet Take 1 tablet (25 mg total) by mouth at bedtime as needed. 30 tablet 0  .  sertraline (ZOLOFT) 50 MG tablet 50 mg daily.  0   No current facility-administered medications on file prior to visit.     No Known Allergies  Objective: Physical Exam  General: Well developed, nourished, no acute distress, awake, alert and oriented x 3  Vascular: Dorsalis pedis artery 2/4 bilateral, Posterior tibial artery 1/4 bilateral, skin temperature warm to warm proximal to distal bilateral lower extremities, no varicosities, pedal hair present bilateral.  Neurological: Gross sensation present via light touch bilateral.   Dermatological: Skin is warm, dry, and supple bilateral, Bilateral hallux nails are short, thick, and polished with moderate subungal debris, all other nails are mildly elongated and minimal debris, no webspace macerations present bilateral, no open lesions present bilateral, no callus/corns/hyperkeratotic tissue present bilateral. No signs of infection bilateral.  Musculoskeletal: No symptomatic boney deformities noted bilateral. Muscular strength within normal limits without painon range of motion. No pain with calf compression bilateral.  Assessment and Plan:  Problem List Items Addressed This Visit    None    Visit Diagnoses    Dermatophytosis of nail    -  Primary   Toe pain, bilateral         -Examined patient.  -Discussed treatment options for painful mycotic nails. -Mechanically debrided and reduced mycotic nails with sterile nail nipper and dremel nail file without  incident. -ABN Signed  -Patient to return in 3 months for follow up evaluation or sooner if symptoms worsen.  Asencion Islam, DPM

## 2017-04-14 ENCOUNTER — Encounter: Payer: Self-pay | Admitting: Sports Medicine

## 2017-04-14 ENCOUNTER — Ambulatory Visit (INDEPENDENT_AMBULATORY_CARE_PROVIDER_SITE_OTHER): Payer: Medicare Other | Admitting: Sports Medicine

## 2017-04-14 DIAGNOSIS — M79675 Pain in left toe(s): Secondary | ICD-10-CM | POA: Diagnosis not present

## 2017-04-14 DIAGNOSIS — B351 Tinea unguium: Secondary | ICD-10-CM

## 2017-04-14 DIAGNOSIS — M79674 Pain in right toe(s): Secondary | ICD-10-CM

## 2017-04-14 DIAGNOSIS — G610 Guillain-Barre syndrome: Secondary | ICD-10-CM

## 2017-04-14 NOTE — Patient Instructions (Signed)
Vinegar soaks 1 cup of white distilled vinegar to 8 cups of warm water.  Soak 20 mins. May repeat soak two times per week.  If there is thickness to nails may file nails after soaks or after bath/shower with nail file and apply tea tree oil. Apply oil daily to nails after filing for the best result.  

## 2017-04-14 NOTE — Progress Notes (Signed)
Subjective: Brandy Hicks is a 84 y.o. female patient seen today in office with complaint of painful thickened toenails; unable to trim. Patient reports that she did not have brain stimulation done to help correct tremor because she did not want to go to doctors constantly. Patient denies any acute pain in toes, denies has no other pedal complaints at this time.     Request to come every 6 months because she gets pedicures as well.   Patient Active Problem List   Diagnosis Date Noted  . GBS (Guillain-Barre syndrome) (HCC) 07/31/2015  . History of Guillain-Barre syndrome 05/02/2015  . Anxiety state 05/02/2015  . Panic attack 05/02/2015  . Essential tremor 05/02/2015  . Altered mental status   . UTI (urinary tract infection) 04/22/2015  . Acute kidney injury (HCC) 04/22/2015  . Essential hypertension, benign 11/28/2013    Current Outpatient Medications on File Prior to Visit  Medication Sig Dispense Refill  . amLODipine (NORVASC) 10 MG tablet Take 10 mg by mouth daily.     Marland Kitchen aspirin 81 MG tablet Take 81 mg by mouth daily.    Marland Kitchen atorvastatin (LIPITOR) 40 MG tablet Take 40 mg by mouth daily.    Marland Kitchen CALCIUM PO Take 125 mg by mouth daily.    . chlorthalidone (HYGROTON) 25 MG tablet 50 mg daily.  0  . Cholecalciferol (VITAMIN D3) 2000 units capsule Take 2,000 Units by mouth daily.    Marland Kitchen gabapentin (NEURONTIN) 300 MG capsule Take 1 capsule (300 mg total) by mouth 3 (three) times daily. 90 capsule 11  . levothyroxine (SYNTHROID, LEVOTHROID) 125 MCG tablet     . losartan (COZAAR) 100 MG tablet   0  . Melatonin 3 MG TABS Take 3 mg by mouth at bedtime.    . Multiple Vitamins-Minerals (MULTIVITAMIN ADULT PO) Take 1 tablet by mouth daily.    . primidone (MYSOLINE) 50 MG tablet Take 1 tablet (50 mg total) by mouth daily. (Patient taking differently: Take 100 mg by mouth daily. ) 30 tablet 0  . propranolol ER (INDERAL LA) 60 MG 24 hr capsule TAKE ONE CAPSULE BY MOUTH EVERY NIGHT AT BEDTIME 90  capsule 3  . QUEtiapine (SEROQUEL) 25 MG tablet Take 1 tablet (25 mg total) by mouth at bedtime as needed. 30 tablet 0  . sertraline (ZOLOFT) 50 MG tablet 50 mg daily.  0   No current facility-administered medications on file prior to visit.     No Known Allergies  Objective: Physical Exam  General: Well developed, nourished, no acute distress, awake, alert and oriented x 3  Vascular: Dorsalis pedis artery 2/4 bilateral, Posterior tibial artery 1/4 bilateral, skin temperature warm to warm proximal to distal bilateral lower extremities, no varicosities, pedal hair present bilateral.  Neurological: Gross sensation present via light touch bilateral.   Dermatological: Skin is warm, dry, and supple bilateral, Bilateral hallux nails are short, thick, and faintly polished with moderate subungal debris, all other nails are mildly elongated and minimal debris, no webspace macerations present bilateral, no open lesions present bilateral, no callus/corns/hyperkeratotic tissue present bilateral. No signs of infection bilateral.  Musculoskeletal: No symptomatic boney deformities noted bilateral. Muscular strength within normal limits without painon range of motion. No pain with calf compression bilateral.  Assessment and Plan:  Problem List Items Addressed This Visit      Nervous and Auditory   GBS (Guillain-Barre syndrome) (HCC)    Other Visit Diagnoses    Dermatophytosis of nail    -  Primary  Toe pain, bilateral          -Examined patient.  -Re-Discussed treatment options for painful mycotic nails. -Mechanically debrided and reduced mycotic nails with sterile nail nipper and dremel nail file without incident. -Dispensed vinegar soaks and tea tree oil to use to nails to help with  -Recommend good supportive shoes and stability in gait because of GBS -Patient to return in 6 months for follow up evaluation or sooner if symptoms worsen.  Asencion Islamitorya Gerene Nedd, DPM

## 2017-10-13 ENCOUNTER — Encounter: Payer: Self-pay | Admitting: Sports Medicine

## 2017-10-13 ENCOUNTER — Ambulatory Visit (INDEPENDENT_AMBULATORY_CARE_PROVIDER_SITE_OTHER): Payer: Medicare Other | Admitting: Sports Medicine

## 2017-10-13 DIAGNOSIS — M79675 Pain in left toe(s): Secondary | ICD-10-CM

## 2017-10-13 DIAGNOSIS — B351 Tinea unguium: Secondary | ICD-10-CM

## 2017-10-13 DIAGNOSIS — M79674 Pain in right toe(s): Secondary | ICD-10-CM

## 2017-10-13 DIAGNOSIS — G610 Guillain-Barre syndrome: Secondary | ICD-10-CM | POA: Diagnosis not present

## 2017-10-13 DIAGNOSIS — R498 Other voice and resonance disorders: Secondary | ICD-10-CM | POA: Insufficient documentation

## 2017-10-13 NOTE — Progress Notes (Signed)
Subjective: Brandy Hicks is a 84 y.o. female patient seen today in office with complaint of painful thickened toenails; unable to trim. Patient reports that things are the same. States that she does have some pain at big toe nails but otherwise is doing fine. Patient has no other pedal complaints at this time.     Patient Active Problem List   Diagnosis Date Noted  . Voice tremor 10/13/2017  . GBS (Guillain-Barre syndrome) (HCC) 07/31/2015  . History of Guillain-Barre syndrome 05/02/2015  . Anxiety state 05/02/2015  . Panic attack 05/02/2015  . Essential tremor 05/02/2015  . Altered mental status   . UTI (urinary tract infection) 04/22/2015  . Acute kidney injury (HCC) 04/22/2015  . Essential hypertension, benign 11/28/2013  . Depression 04/09/2011  . Insomnia 04/09/2011    Current Outpatient Medications on File Prior to Visit  Medication Sig Dispense Refill  . amLODipine (NORVASC) 10 MG tablet Take 10 mg by mouth daily.     Marland Kitchen. aspirin 81 MG tablet Take 81 mg by mouth daily.    Marland Kitchen. atorvastatin (LIPITOR) 40 MG tablet Take 40 mg by mouth daily.    Marland Kitchen. CALCIUM PO Take 125 mg by mouth daily.    . chlorthalidone (HYGROTON) 25 MG tablet 50 mg daily.  0  . Cholecalciferol (VITAMIN D3) 2000 units capsule Take 2,000 Units by mouth daily.    Marland Kitchen. gabapentin (NEURONTIN) 300 MG capsule Take 1 capsule (300 mg total) by mouth 3 (three) times daily. 90 capsule 11  . levothyroxine (SYNTHROID, LEVOTHROID) 125 MCG tablet     . losartan (COZAAR) 100 MG tablet   0  . Melatonin 3 MG TABS Take 3 mg by mouth at bedtime.    . Multiple Vitamins-Minerals (MULTIVITAMIN ADULT PO) Take 1 tablet by mouth daily.    . primidone (MYSOLINE) 50 MG tablet Take 1 tablet (50 mg total) by mouth daily. (Patient taking differently: Take 100 mg by mouth daily. ) 30 tablet 0  . propranolol ER (INDERAL LA) 60 MG 24 hr capsule TAKE ONE CAPSULE BY MOUTH EVERY NIGHT AT BEDTIME 90 capsule 3  . QUEtiapine (SEROQUEL) 25 MG tablet  Take 1 tablet (25 mg total) by mouth at bedtime as needed. 30 tablet 0  . sertraline (ZOLOFT) 50 MG tablet 50 mg daily.  0   No current facility-administered medications on file prior to visit.     Allergies  Allergen Reactions  . Hydroquinone     Pinkness and edema of face and eyelids, severe    Objective: Physical Exam  General: Well developed, nourished, no acute distress, awake, alert and oriented x 3  Vascular: Dorsalis pedis artery 2/4 bilateral, Posterior tibial artery 1/4 bilateral, skin temperature warm to warm proximal to distal bilateral lower extremities, no varicosities, pedal hair present bilateral.  Neurological: Gross sensation present via light touch bilateral.   Dermatological: Skin is warm, dry, and supple bilateral, Bilateral hallux nails are short, thick, and faintly polished with moderate subungal debris, all other nails are mildly elongated and minimal debris, no webspace macerations present bilateral, no open lesions present bilateral, no callus/corns/hyperkeratotic tissue present bilateral. No signs of infection bilateral.  Musculoskeletal: No symptomatic boney deformities noted bilateral. Muscular strength within normal limits without painon range of motion. No pain with calf compression bilateral.  Assessment and Plan:  Problem List Items Addressed This Visit      Nervous and Auditory   GBS (Guillain-Barre syndrome) (HCC)    Other Visit Diagnoses    Dermatophytosis  of nail    -  Primary   Toe pain, bilateral          -Examined patient.  -Re-Discussed treatment options for painful mycotic nails. -Mechanically debrided and reduced mycotic nails with sterile nail nipper and dremel nail file without incident. -Recommend good supportive shoes and stability in gait because of GBS -Patient to return in 6 months for follow up evaluation or sooner if symptoms worsen.  Asencion Islam, DPM

## 2018-04-20 ENCOUNTER — Encounter: Payer: Self-pay | Admitting: Sports Medicine

## 2018-04-20 ENCOUNTER — Ambulatory Visit (INDEPENDENT_AMBULATORY_CARE_PROVIDER_SITE_OTHER): Payer: Medicare Other | Admitting: Sports Medicine

## 2018-04-20 DIAGNOSIS — M79674 Pain in right toe(s): Secondary | ICD-10-CM

## 2018-04-20 DIAGNOSIS — B351 Tinea unguium: Secondary | ICD-10-CM | POA: Diagnosis not present

## 2018-04-20 DIAGNOSIS — M79675 Pain in left toe(s): Secondary | ICD-10-CM

## 2018-04-20 DIAGNOSIS — G610 Guillain-Barre syndrome: Secondary | ICD-10-CM

## 2018-04-20 NOTE — Progress Notes (Signed)
Subjective: Brandy Hicks is a 85 y.o. female patient seen today in office with complaint of painful thickened toenails; unable to trim. Patient reports that things are the same.No changes with medications or health since last visit. Patient has no other pedal complaints at this time.     Patient Active Problem List   Diagnosis Date Noted  . Voice tremor 10/13/2017  . GBS (Guillain-Barre syndrome) (HCC) 07/31/2015  . History of Guillain-Barre syndrome 05/02/2015  . Anxiety state 05/02/2015  . Panic attack 05/02/2015  . Essential tremor 05/02/2015  . Altered mental status   . UTI (urinary tract infection) 04/22/2015  . Acute kidney injury (HCC) 04/22/2015  . Essential hypertension, benign 11/28/2013  . Depression 04/09/2011  . Insomnia 04/09/2011    Current Outpatient Medications on File Prior to Visit  Medication Sig Dispense Refill  . amLODipine (NORVASC) 10 MG tablet Take 10 mg by mouth daily.     Marland Kitchen aspirin 81 MG tablet Take 81 mg by mouth daily.    Marland Kitchen atorvastatin (LIPITOR) 40 MG tablet Take 40 mg by mouth daily.    Marland Kitchen CALCIUM PO Take 125 mg by mouth daily.    . chlorthalidone (HYGROTON) 25 MG tablet 50 mg daily.  0  . Cholecalciferol (VITAMIN D3) 2000 units capsule Take 2,000 Units by mouth daily.    Marland Kitchen gabapentin (NEURONTIN) 300 MG capsule Take 1 capsule (300 mg total) by mouth 3 (three) times daily. 90 capsule 11  . levothyroxine (SYNTHROID, LEVOTHROID) 125 MCG tablet     . losartan (COZAAR) 100 MG tablet   0  . Melatonin 3 MG TABS Take 3 mg by mouth at bedtime.    . Multiple Vitamins-Minerals (MULTIVITAMIN ADULT PO) Take 1 tablet by mouth daily.    . primidone (MYSOLINE) 50 MG tablet Take 1 tablet (50 mg total) by mouth daily. (Patient taking differently: Take 100 mg by mouth daily. ) 30 tablet 0  . propranolol ER (INDERAL LA) 60 MG 24 hr capsule TAKE ONE CAPSULE BY MOUTH EVERY NIGHT AT BEDTIME 90 capsule 3  . QUEtiapine (SEROQUEL) 25 MG tablet Take 1 tablet (25 mg  total) by mouth at bedtime as needed. 30 tablet 0  . sertraline (ZOLOFT) 50 MG tablet 50 mg daily.  0   No current facility-administered medications on file prior to visit.     Allergies  Allergen Reactions  . Hydroquinone     Pinkness and edema of face and eyelids, severe    Objective: Physical Exam  General: Well developed, nourished, no acute distress, awake, alert and oriented x 3  Vascular: Dorsalis pedis artery 2/4 bilateral, Posterior tibial artery 1/4 bilateral, skin temperature warm to warm proximal to distal bilateral lower extremities, no varicosities, pedal hair present bilateral.  Neurological: Gross sensation present via light touch bilateral.   Dermatological: Skin is warm, dry, and supple bilateral, Bilateral hallux nails are short, thick, and faintly polished with moderate subungal debris, all other nails are mildly elongated and minimal debris, no webspace macerations present bilateral, no open lesions present bilateral, no callus/corns/hyperkeratotic tissue present bilateral. No signs of infection bilateral.  Musculoskeletal: No symptomatic boney deformities noted bilateral. Muscular strength within normal limits without painon range of motion. No pain with calf compression bilateral.  Assessment and Plan:  Problem List Items Addressed This Visit      Nervous and Auditory   GBS (Guillain-Barre syndrome) (HCC)    Other Visit Diagnoses    Dermatophytosis of nail    -  Primary  Toe pain, bilateral          -Examined patient.  -Re-Discussed treatment options for painful mycotic nails. -Mechanically debrided and reduced mycotic nails with sterile nail nipper and dremel nail file without incident. -Patient to return in 6 months for follow up evaluation or sooner if symptoms worsen.  Asencion Islam, DPM

## 2018-07-05 ENCOUNTER — Telehealth: Payer: Self-pay | Admitting: *Deleted

## 2018-07-05 NOTE — Telephone Encounter (Signed)
Received refill request from [facility] for Propranolol ER. Last ordered May 2018, last seen May 2018. Refill denied and note sent to pharmacy that pt needs an appt or she can see PCP for refills. Received a receipt of confirmation.

## 2018-10-19 ENCOUNTER — Ambulatory Visit: Payer: Medicare Other | Admitting: Sports Medicine

## 2019-01-25 ENCOUNTER — Encounter: Payer: Self-pay | Admitting: Sports Medicine

## 2019-01-25 ENCOUNTER — Ambulatory Visit (INDEPENDENT_AMBULATORY_CARE_PROVIDER_SITE_OTHER): Payer: Medicare Other | Admitting: Sports Medicine

## 2019-01-25 ENCOUNTER — Other Ambulatory Visit: Payer: Self-pay

## 2019-01-25 DIAGNOSIS — M79674 Pain in right toe(s): Secondary | ICD-10-CM | POA: Diagnosis not present

## 2019-01-25 DIAGNOSIS — G610 Guillain-Barre syndrome: Secondary | ICD-10-CM

## 2019-01-25 DIAGNOSIS — M79675 Pain in left toe(s): Secondary | ICD-10-CM | POA: Diagnosis not present

## 2019-01-25 DIAGNOSIS — B351 Tinea unguium: Secondary | ICD-10-CM

## 2019-01-25 NOTE — Patient Instructions (Signed)
Apply tea tree oil to fingernail at bedtime to help restore nails

## 2019-01-25 NOTE — Progress Notes (Signed)
Subjective: Brandy Hicks is a 85 y.o. female patient seen today in office with complaint of painful thickened toenails; unable to trim. Patient reports that things are the same.No changes with medications or health since last visit except having more pain as nails grow out. Patient has no other pedal complaints at this time.     Patient Active Problem List   Diagnosis Date Noted  . Voice tremor 10/13/2017  . GBS (Guillain-Barre syndrome) (HCC) 07/31/2015  . History of Guillain-Barre syndrome 05/02/2015  . Anxiety state 05/02/2015  . Panic attack 05/02/2015  . Essential tremor 05/02/2015  . Altered mental status   . UTI (urinary tract infection) 04/22/2015  . Acute kidney injury (HCC) 04/22/2015  . Essential hypertension, benign 11/28/2013  . Depression 04/09/2011  . Insomnia 04/09/2011    Current Outpatient Medications on File Prior to Visit  Medication Sig Dispense Refill  . amLODipine (NORVASC) 10 MG tablet Take 10 mg by mouth daily.     Marland Kitchen aspirin 81 MG tablet Take 81 mg by mouth daily.    Marland Kitchen atorvastatin (LIPITOR) 40 MG tablet Take 40 mg by mouth daily.    Marland Kitchen CALCIUM PO Take 125 mg by mouth daily.    . chlorthalidone (HYGROTON) 25 MG tablet 50 mg daily.  0  . Cholecalciferol (VITAMIN D3) 2000 units capsule Take 2,000 Units by mouth daily.    Marland Kitchen gabapentin (NEURONTIN) 300 MG capsule Take 1 capsule (300 mg total) by mouth 3 (three) times daily. 90 capsule 11  . levothyroxine (SYNTHROID, LEVOTHROID) 125 MCG tablet     . losartan (COZAAR) 100 MG tablet   0  . Melatonin 3 MG TABS Take 3 mg by mouth at bedtime.    . Multiple Vitamins-Minerals (MULTIVITAMIN ADULT PO) Take 1 tablet by mouth daily.    . primidone (MYSOLINE) 50 MG tablet Take 1 tablet (50 mg total) by mouth daily. (Patient taking differently: Take 100 mg by mouth daily. ) 30 tablet 0  . propranolol ER (INDERAL LA) 60 MG 24 hr capsule TAKE ONE CAPSULE BY MOUTH EVERY NIGHT AT BEDTIME 90 capsule 3  . QUEtiapine  (SEROQUEL) 25 MG tablet Take 1 tablet (25 mg total) by mouth at bedtime as needed. 30 tablet 0  . sertraline (ZOLOFT) 50 MG tablet 50 mg daily.  0   No current facility-administered medications on file prior to visit.     Allergies  Allergen Reactions  . Hydroquinone     Pinkness and edema of face and eyelids, severe    Objective: Physical Exam  General: Well developed, nourished, no acute distress, awake, alert and oriented x 3  Vascular: Dorsalis pedis artery 1/4 bilateral, Posterior tibial artery 1/4 bilateral, skin temperature warm to warm proximal to distal bilateral lower extremities, no varicosities, pedal hair present bilateral.  Neurological: Gross sensation present via light touch bilateral.   Dermatological: Skin is warm, dry, and supple bilateral, Bilateral hallux nails are short, thick, and faintly polished with moderate subungal debris, all other nails are mildly elongated and minimal debris, no webspace macerations present bilateral, no open lesions present bilateral, no callus/corns/hyperkeratotic tissue present bilateral. No signs of infection bilateral.  Musculoskeletal: No symptomatic boney deformities noted bilateral. Muscular strength within normal limits without painon range of motion. No pain with calf compression bilateral.  Assessment and Plan:  Problem List Items Addressed This Visit      Nervous and Auditory   GBS (Guillain-Barre syndrome) (HCC)    Other Visit Diagnoses    Dermatophytosis  of nail    -  Primary   Toe pain, bilateral          -Examined patient.  -Re-Discussed treatment options for painful mycotic nails and pain to toes. -Mechanically debrided and reduced mycotic nails with sterile nail nipper and dremel nail file without incident. -Patient to return in 6 months for follow up evaluation or sooner if symptoms worsen.  Landis Martins, DPM

## 2019-04-18 ENCOUNTER — Other Ambulatory Visit: Payer: Self-pay

## 2019-04-18 ENCOUNTER — Observation Stay (HOSPITAL_COMMUNITY)
Admission: EM | Admit: 2019-04-18 | Discharge: 2019-04-20 | Disposition: A | Discharge status: Home / Self Care | Payer: Medicare Other | Attending: Internal Medicine | Admitting: Internal Medicine

## 2019-04-18 DIAGNOSIS — E876 Hypokalemia: Secondary | ICD-10-CM | POA: Diagnosis not present

## 2019-04-18 DIAGNOSIS — E039 Hypothyroidism, unspecified: Secondary | ICD-10-CM | POA: Insufficient documentation

## 2019-04-18 DIAGNOSIS — Z7982 Long term (current) use of aspirin: Secondary | ICD-10-CM | POA: Insufficient documentation

## 2019-04-18 DIAGNOSIS — Z20822 Contact with and (suspected) exposure to covid-19: Secondary | ICD-10-CM | POA: Insufficient documentation

## 2019-04-18 DIAGNOSIS — D72829 Elevated white blood cell count, unspecified: Secondary | ICD-10-CM | POA: Diagnosis not present

## 2019-04-18 DIAGNOSIS — Z79899 Other long term (current) drug therapy: Secondary | ICD-10-CM | POA: Insufficient documentation

## 2019-04-18 DIAGNOSIS — N838 Other noninflammatory disorders of ovary, fallopian tube and broad ligament: Secondary | ICD-10-CM

## 2019-04-18 DIAGNOSIS — R338 Other retention of urine: Secondary | ICD-10-CM | POA: Diagnosis present

## 2019-04-18 DIAGNOSIS — I10 Essential (primary) hypertension: Secondary | ICD-10-CM | POA: Insufficient documentation

## 2019-04-18 DIAGNOSIS — Z87891 Personal history of nicotine dependence: Secondary | ICD-10-CM | POA: Insufficient documentation

## 2019-04-18 DIAGNOSIS — K5641 Fecal impaction: Principal | ICD-10-CM | POA: Diagnosis present

## 2019-04-18 DIAGNOSIS — E785 Hyperlipidemia, unspecified: Secondary | ICD-10-CM | POA: Insufficient documentation

## 2019-04-18 DIAGNOSIS — Z8673 Personal history of transient ischemic attack (TIA), and cerebral infarction without residual deficits: Secondary | ICD-10-CM | POA: Insufficient documentation

## 2019-04-18 DIAGNOSIS — R339 Retention of urine, unspecified: Secondary | ICD-10-CM | POA: Diagnosis present

## 2019-04-18 DIAGNOSIS — E871 Hypo-osmolality and hyponatremia: Secondary | ICD-10-CM | POA: Diagnosis present

## 2019-04-18 DIAGNOSIS — Z888 Allergy status to other drugs, medicaments and biological substances status: Secondary | ICD-10-CM | POA: Diagnosis not present

## 2019-04-18 NOTE — ED Triage Notes (Addendum)
Per EMS, patient c/o urinary retention since this morning. Patient denies any pain at the moment. Patient says she feels pressure and an urge to pee, but is unable to do so. Patient also c/o constipation, has not had normal BM for 6 days and tried an at home enema today that did not help. Patient denies UTI symptoms. Patient from [facility], a[facility], and ambulatory.

## 2019-04-19 ENCOUNTER — Observation Stay (HOSPITAL_COMMUNITY): Payer: Medicare Other

## 2019-04-19 ENCOUNTER — Emergency Department (HOSPITAL_COMMUNITY): Payer: Medicare Other

## 2019-04-19 ENCOUNTER — Encounter (HOSPITAL_COMMUNITY): Payer: Self-pay

## 2019-04-19 DIAGNOSIS — E876 Hypokalemia: Secondary | ICD-10-CM | POA: Diagnosis present

## 2019-04-19 DIAGNOSIS — K5641 Fecal impaction: Principal | ICD-10-CM

## 2019-04-19 DIAGNOSIS — E871 Hypo-osmolality and hyponatremia: Secondary | ICD-10-CM | POA: Diagnosis not present

## 2019-04-19 DIAGNOSIS — D72829 Elevated white blood cell count, unspecified: Secondary | ICD-10-CM | POA: Diagnosis present

## 2019-04-19 DIAGNOSIS — R339 Retention of urine, unspecified: Secondary | ICD-10-CM | POA: Diagnosis present

## 2019-04-19 DIAGNOSIS — R338 Other retention of urine: Secondary | ICD-10-CM

## 2019-04-19 LAB — URINALYSIS, ROUTINE W REFLEX MICROSCOPIC
Bilirubin Urine: NEGATIVE
Glucose, UA: NEGATIVE mg/dL
Hgb urine dipstick: NEGATIVE
Ketones, ur: NEGATIVE mg/dL
Leukocytes,Ua: NEGATIVE
Nitrite: NEGATIVE
Protein, ur: NEGATIVE mg/dL
Specific Gravity, Urine: 1.017 (ref 1.005–1.030)
pH: 5 (ref 5.0–8.0)

## 2019-04-19 LAB — CBC WITH DIFFERENTIAL/PLATELET
Abs Immature Granulocytes: 0.23 10*3/uL — ABNORMAL HIGH (ref 0.00–0.07)
Basophils Absolute: 0.1 10*3/uL (ref 0.0–0.1)
Basophils Relative: 0 %
Eosinophils Absolute: 0.1 10*3/uL (ref 0.0–0.5)
Eosinophils Relative: 1 %
HCT: 38.1 % (ref 36.0–46.0)
Hemoglobin: 12.8 g/dL (ref 12.0–15.0)
Immature Granulocytes: 1 %
Lymphocytes Relative: 10 %
Lymphs Abs: 2 10*3/uL (ref 0.7–4.0)
MCH: 31.8 pg (ref 26.0–34.0)
MCHC: 33.6 g/dL (ref 30.0–36.0)
MCV: 94.5 fL (ref 80.0–100.0)
Monocytes Absolute: 1.2 10*3/uL — ABNORMAL HIGH (ref 0.1–1.0)
Monocytes Relative: 6 %
Neutro Abs: 15.5 10*3/uL — ABNORMAL HIGH (ref 1.7–7.7)
Neutrophils Relative %: 82 %
Platelets: 290 10*3/uL (ref 150–400)
RBC: 4.03 MIL/uL (ref 3.87–5.11)
RDW: 13 % (ref 11.5–15.5)
WBC: 19.1 10*3/uL — ABNORMAL HIGH (ref 4.0–10.5)
nRBC: 0 % (ref 0.0–0.2)

## 2019-04-19 LAB — COMPREHENSIVE METABOLIC PANEL
ALT: 38 U/L (ref 0–44)
AST: 40 U/L (ref 15–41)
Albumin: 3.7 g/dL (ref 3.5–5.0)
Alkaline Phosphatase: 64 U/L (ref 38–126)
Anion gap: 10 (ref 5–15)
BUN: 25 mg/dL — ABNORMAL HIGH (ref 8–23)
CO2: 28 mmol/L (ref 22–32)
Calcium: 8.9 mg/dL (ref 8.9–10.3)
Chloride: 96 mmol/L — ABNORMAL LOW (ref 98–111)
Creatinine, Ser: 0.98 mg/dL (ref 0.44–1.00)
GFR calc Af Amer: >60 mL/min (ref >60)
GFR calc non Af Amer: 53 mL/min — ABNORMAL LOW (ref >60)
Glucose, Bld: 129 mg/dL — ABNORMAL HIGH (ref 70–99)
Potassium: 3.2 mmol/L — ABNORMAL LOW (ref 3.5–5.1)
Sodium: 134 mmol/L — ABNORMAL LOW (ref 135–145)
Total Bilirubin: 0.4 mg/dL (ref 0.3–1.2)
Total Protein: 7.2 g/dL (ref 6.5–8.1)

## 2019-04-19 LAB — CBC
HCT: 38.2 % (ref 36.0–46.0)
Hemoglobin: 13.1 g/dL (ref 12.0–15.0)
MCH: 32.8 pg (ref 26.0–34.0)
MCHC: 34.3 g/dL (ref 30.0–36.0)
MCV: 95.7 fL (ref 80.0–100.0)
Platelets: 283 10*3/uL (ref 150–400)
RBC: 3.99 MIL/uL (ref 3.87–5.11)
RDW: 13.2 % (ref 11.5–15.5)
WBC: 15.5 10*3/uL — ABNORMAL HIGH (ref 4.0–10.5)
nRBC: 0 % (ref 0.0–0.2)

## 2019-04-19 LAB — BASIC METABOLIC PANEL
Anion gap: 10 (ref 5–15)
BUN: 22 mg/dL (ref 8–23)
CO2: 28 mmol/L (ref 22–32)
Calcium: 8.8 mg/dL — ABNORMAL LOW (ref 8.9–10.3)
Chloride: 98 mmol/L (ref 98–111)
Creatinine, Ser: 0.99 mg/dL (ref 0.44–1.00)
GFR calc Af Amer: >60 mL/min (ref >60)
GFR calc non Af Amer: 52 mL/min — ABNORMAL LOW (ref >60)
Glucose, Bld: 125 mg/dL — ABNORMAL HIGH (ref 70–99)
Potassium: 3.8 mmol/L (ref 3.5–5.1)
Sodium: 136 mmol/L (ref 135–145)

## 2019-04-19 LAB — LIPASE, BLOOD: Lipase: 23 U/L (ref 11–51)

## 2019-04-19 LAB — SARS CORONAVIRUS 2 (TAT 6-24 HRS): SARS Coronavirus 2: NEGATIVE

## 2019-04-19 MED ORDER — CHLORTHALIDONE 50 MG PO TABS
50.0000 mg | ORAL_TABLET | Freq: Every day | ORAL | Status: DC
  Administered 2019-04-19 – 2019-04-20 (×2): 50 mg via ORAL
  Filled 2019-04-19 (×2): qty 1

## 2019-04-19 MED ORDER — PRIMIDONE 50 MG PO TABS
200.0000 mg | ORAL_TABLET | Freq: Two times a day (BID) | ORAL | Status: DC
  Administered 2019-04-19 – 2019-04-20 (×2): 200 mg via ORAL
  Filled 2019-04-19 (×4): qty 4

## 2019-04-19 MED ORDER — IBUPROFEN 200 MG PO TABS: 200.0000 mg | ORAL_TABLET | Freq: Every day | ORAL | Status: DC | PRN

## 2019-04-19 MED ORDER — PRIMIDONE 50 MG PO TABS
100.0000 mg | ORAL_TABLET | Freq: Every day | ORAL | Status: DC
  Administered 2019-04-19: 10:00:00 100 mg via ORAL
  Filled 2019-04-19: qty 2

## 2019-04-19 MED ORDER — SODIUM CHLORIDE (PF) 0.9 % IJ SOLN
INTRAMUSCULAR | Status: AC
  Filled 2019-04-19: qty 50

## 2019-04-19 MED ORDER — ASPIRIN EC 81 MG PO TBEC
81.0000 mg | DELAYED_RELEASE_TABLET | Freq: Every day | ORAL | Status: DC
  Administered 2019-04-19 – 2019-04-20 (×2): 81 mg via ORAL
  Filled 2019-04-19 (×2): qty 1

## 2019-04-19 MED ORDER — ATORVASTATIN CALCIUM 40 MG PO TABS
40.0000 mg | ORAL_TABLET | Freq: Every day | ORAL | Status: DC
  Administered 2019-04-19 – 2019-04-20 (×2): 40 mg via ORAL
  Filled 2019-04-19 (×2): qty 1

## 2019-04-19 MED ORDER — LEVOTHYROXINE SODIUM 137 MCG PO TABS
137.0000 ug | ORAL_TABLET | Freq: Every day | ORAL | Status: DC
  Administered 2019-04-20: 07:00:00 137 ug via ORAL
  Filled 2019-04-19: qty 1

## 2019-04-19 MED ORDER — ACETAMINOPHEN 325 MG PO TABS: 650.0000 mg | ORAL_TABLET | Freq: Four times a day (QID) | ORAL | Status: DC | PRN

## 2019-04-19 MED ORDER — LOSARTAN POTASSIUM 50 MG PO TABS
100.0000 mg | ORAL_TABLET | Freq: Every day | ORAL | Status: DC
  Administered 2019-04-19 – 2019-04-20 (×2): 100 mg via ORAL
  Filled 2019-04-19 (×2): qty 2

## 2019-04-19 MED ORDER — SERTRALINE HCL 50 MG PO TABS
50.0000 mg | ORAL_TABLET | Freq: Every day | ORAL | Status: DC
  Administered 2019-04-19 – 2019-04-20 (×2): 50 mg via ORAL
  Filled 2019-04-19 (×2): qty 1

## 2019-04-19 MED ORDER — POTASSIUM CHLORIDE IN NACL 20-0.9 MEQ/L-% IV SOLN
INTRAVENOUS | Status: AC
  Filled 2019-04-19: qty 1000

## 2019-04-19 MED ORDER — PROPRANOLOL HCL ER 60 MG PO CP24
60.0000 mg | ORAL_CAPSULE | Freq: Every day | ORAL | Status: DC
  Administered 2019-04-19 – 2019-04-20 (×2): 60 mg via ORAL
  Filled 2019-04-19 (×2): qty 1

## 2019-04-19 MED ORDER — ENOXAPARIN SODIUM 40 MG/0.4ML ~~LOC~~ SOLN
40.0000 mg | Freq: Every day | SUBCUTANEOUS | Status: DC
  Filled 2019-04-19 (×2): qty 0.4

## 2019-04-19 MED ORDER — PEG 3350-KCL-NA BICARB-NACL 420 G PO SOLR
4000.0000 mL | Freq: Once | ORAL | Status: AC
  Administered 2019-04-19: 05:00:00 4000 mL via ORAL
  Filled 2019-04-19: qty 4000

## 2019-04-19 MED ORDER — IOHEXOL 300 MG/ML  SOLN
100.0000 mL | Freq: Once | INTRAMUSCULAR | Status: AC | PRN
  Administered 2019-04-19: 03:00:00 80 mL via INTRAVENOUS

## 2019-04-19 MED ORDER — ACETAMINOPHEN 650 MG RE SUPP: 650.0000 mg | Freq: Four times a day (QID) | RECTAL | Status: DC | PRN

## 2019-04-19 MED ORDER — MELATONIN 3 MG PO TABS
3.0000 mg | ORAL_TABLET | Freq: Every day | ORAL | Status: DC
  Administered 2019-04-19: 22:00:00 3 mg via ORAL
  Filled 2019-04-19: qty 1

## 2019-04-19 MED ORDER — AMLODIPINE BESYLATE 5 MG PO TABS
10.0000 mg | ORAL_TABLET | Freq: Every day | ORAL | Status: DC
  Administered 2019-04-19 – 2019-04-20 (×2): 10 mg via ORAL
  Filled 2019-04-19 (×2): qty 2

## 2019-04-19 MED ORDER — FLEET ENEMA 7-19 GM/118ML RE ENEM
1.0000 | ENEMA | Freq: Once | RECTAL | Status: AC
  Administered 2019-04-19: 05:00:00 1 via RECTAL
  Filled 2019-04-19: qty 1

## 2019-04-19 NOTE — ED Notes (Signed)
Patient requesting something for pain before transvaginal US. Dr. Selena Batten put in new order for ibuprofen and instructed nurse to give both ibuprofen and acetaminophen before [place]. Call us when ready for imaging.

## 2019-04-19 NOTE — H&P (Addendum)
TRH H&P    Patient Demographics:    Brandy Hicks, is a 86 y.o. female  MRN: [id number]  DOB - September 27, 1932  Admit Date - 04/18/2019  Referring MD/NP/PA:  Thayer Jew  Outpatient Primary MD for the patient is Hayden Rasmussen, MD  Patient coming from:  Independent living, [place]  Chief complaint-  Urinary retention   HPI:    Brandy Hicks  is a 86 y.o. female, w hypertension, hyperlipidemia, hypothyroidism, h/o guillain barre, h/o essential tremor, presents with urinary retention, starting yesterday.  Pt is having bm daily but less than normal, very small amounts x6 days. Pt denies fever, chills, cough, cp, palp, sob, n/v, abd pain, diarrhea, brbpr, black stool, dysuria, hematuria.   In ED,  T 99.1, P 78, R 18, Bp 178/65  Pox 94% onRA WT 63.5kg  CT abd/ pelvis IMPRESSION: 1. There is a large amount of stool in the rectum with mild perirectal fat stranding, which may reflect stercoral colitis. 2. There is a large amount of stool in the ascending colon. 3. There is a possible 2.7 cm cystic lesion centered within the right ovary. A follow-up ultrasound is recommended in 6 months. 4. There is a 1.3 cm airspace opacity in the partially visualized lingula. This is favored to represent atelectasis however a three-month follow-up CT of the chest is recommended to confirm stability or resolution of this finding. 5. Cholelithiasis without acute inflammation.  Wbc 19.1, hgb 12.8, Plt 290 Na 134, K 3.2, Bun 25, Creatinine 0.98 Ast 40, Alt 38  Urinalysis negative  Pt will be admitted for fectal impaction, ? Stercoral colitis and cystic lesion of the right ovary and 1.3cm ? atelectasis    Review of systems:    In addition to the HPI above,  No Fever-chills, No Headache, No changes with Vision or hearing, No problems swallowing food or Liquids, No Chest pain, Cough or Shortness of  Breath, No Abdominal pain, No Nausea or Vomiting,  No Blood in stool or Urine, No dysuria, No new skin rashes or bruises, No new joints pains-aches,  No new weakness, tingling, numbness in any extremity, No recent weight gain or loss, No polyuria, polydypsia or polyphagia, No significant Mental Stressors.  All other systems reviewed and are negative.    Past History of the following :    Past Medical History:  Diagnosis Date  . Depression   . Guillain Barr syndrome ([place])   . Hormone replacement therapy   . Hyperlipidemia   . Hypertension   . Hypothyroid   . Osteoporosis   . Rosacea   . TIA (transient ischemic attack)   . Tremor       Past Surgical History:  Procedure Laterality Date  . FACIAL COSMETIC SURGERY    . HEEL SPUR SURGERY    . TONSILLECTOMY        Social History:      Social History   Tobacco Use  . Smoking status: Former Research scientist (life sciences)  . Smokeless tobacco: Never Used  Substance Use Topics  .  Alcohol use: No       Family History :     Family History  Problem Relation Age of Onset  . Stroke Mother   . Hypertension Father   . CAD Father   . Prostate cancer Father        Home Medications:   Prior to Admission medications   Medication Sig Start Date End Date Taking? Authorizing Provider  amLODipine (NORVASC) 10 MG tablet Take 10 mg by mouth daily.    Yes [provider]  aspirin 81 MG tablet Take 81 mg by mouth daily.   Yes [provider]  atorvastatin (LIPITOR) 40 MG tablet Take 40 mg by mouth daily.   Yes [provider]  calcium-vitamin D (OSCAL WITH D) 500-200 MG-UNIT tablet Take 1 tablet by mouth daily with breakfast.   Yes [provider]  chlorthalidone (HYGROTON) 25 MG tablet Take 50 mg by mouth daily.  05/21/15  Yes [provider]  levothyroxine (SYNTHROID) 137 MCG tablet Take 137 mcg by mouth daily. 02/04/19  Yes [provider]  losartan (COZAAR) 100 MG tablet Take 100 mg by  mouth daily.   Yes [provider]  Melatonin 3 MG TABS Take 3 mg by mouth at bedtime.   Yes [provider]  Multiple Vitamins-Minerals (MULTIVITAMIN ADULT PO) Take 1 tablet by mouth daily.   Yes [provider]  primidone (MYSOLINE) 50 MG tablet Take 1 tablet (50 mg total) by mouth daily. Patient taking differently: Take 100 mg by mouth daily.  04/24/15  Yes Selina CooleyFlores, Kyle, MD  propranolol ER (INDERAL LA) 60 MG 24 hr capsule TAKE ONE CAPSULE BY MOUTH EVERY NIGHT AT BEDTIME Patient taking differently: Take 60 mg by mouth daily.  07/24/16  Yes Anson FretAhern, Antonia B, MD  sertraline (ZOLOFT) 50 MG tablet Take 50 mg by mouth daily.  05/21/15  Yes [provider]  gabapentin (NEURONTIN) 300 MG capsule Take 1 capsule (300 mg total) by mouth 3 (three) times daily. Patient not taking: Reported on 04/19/2019 07/30/15   Anson FretAhern, Antonia B, MD  QUEtiapine (SEROQUEL) 25 MG tablet Take 1 tablet (25 mg total) by mouth at bedtime as needed. Patient not taking: Reported on 04/19/2019 04/24/15   Selina CooleyFlores, Kyle, MD     Allergies:     Allergies  Allergen Reactions  . Hydroquinone     Pinkness and edema of face and eyelids, severe     Physical Exam:   Vitals  Blood pressure (!) 111/95, pulse 72, temperature 99.1 F (37.3 C), temperature source Oral, resp. rate 18, height 5' 1.5" (1.562 m), weight 63.5 kg, SpO2 96 %.  1.  General: axoxo3  2. Psychiatric: euthymic  3. Neurologic: nonfocal  4. HEENMT:  Anicteric, pupils 1.315mm symmetric, direct, consensual intact Neck: no jvd, no bruit  5. Respiratory : CTAB  6. Cardiovascular : rrr s1, s2, no m/g/r  7. Gastrointestinal:  Abd: soft, nt, nd, +bs Obese  8. Skin:  Ext: no c/c/e, onychomycosis  9.Musculoskeletal:  Good ROM    Data Review:    CBC Recent Labs  Lab 04/19/19 0118  WBC 19.1*  HGB 12.8  HCT 38.1  PLT 290  MCV 94.5  MCH 31.8  MCHC 33.6  RDW 13.0  LYMPHSABS 2.0  MONOABS 1.2*  EOSABS 0.1    BASOSABS 0.1   ------------------------------------------------------------------------------------------------------------------  Results for orders placed or performed during the hospital encounter of 04/18/19 (from the past 48 hour(s))  CBC with Differential  Status: Abnormal   Collection Time: 04/19/19  1:18 AM  Result Value Ref Range   WBC 19.1 (H) 4.0 - 10.5 K/uL   RBC 4.03 3.87 - 5.11 MIL/uL   Hemoglobin 12.8 12.0 - 15.0 g/dL   HCT 97.9 89.2 - 11.9 %   MCV 94.5 80.0 - 100.0 fL   MCH 31.8 26.0 - 34.0 pg   MCHC 33.6 30.0 - 36.0 g/dL   RDW 41.7 40.8 - 14.4 %   Platelets 290 150 - 400 K/uL   nRBC 0.0 0.0 - 0.2 %   Neutrophils Relative % 82 %   Neutro Abs 15.5 (H) 1.7 - 7.7 K/uL   Lymphocytes Relative 10 %   Lymphs Abs 2.0 0.7 - 4.0 K/uL   Monocytes Relative 6 %   Monocytes Absolute 1.2 (H) 0.1 - 1.0 K/uL   Eosinophils Relative 1 %   Eosinophils Absolute 0.1 0.0 - 0.5 K/uL   Basophils Relative 0 %   Basophils Absolute 0.1 0.0 - 0.1 K/uL   Immature Granulocytes 1 %   Abs Immature Granulocytes 0.23 (H) 0.00 - 0.07 K/uL    Comment: Performed at [facility], [address], [place], [place] [address]  Comprehensive metabolic panel     Status: Abnormal   Collection Time: 04/19/19  1:18 AM  Result Value Ref Range   Sodium 134 (L) 135 - 145 mmol/L   Potassium 3.2 (L) 3.5 - 5.1 mmol/L   Chloride 96 (L) 98 - 111 mmol/L   CO2 28 22 - 32 mmol/L   Glucose, Bld 129 (H) 70 - 99 mg/dL   BUN 25 (H) 8 - 23 mg/dL   Creatinine, Ser 3.14 0.44 - 1.00 mg/dL   Calcium 8.9 8.9 - 97.0 mg/dL   Total Protein 7.2 6.5 - 8.1 g/dL   Albumin 3.7 3.5 - 5.0 g/dL   AST 40 15 - 41 U/L   ALT 38 0 - 44 U/L   Alkaline Phosphatase 64 38 - 126 U/L   Total Bilirubin 0.4 0.3 - 1.2 mg/dL   GFR calc non Af Amer 53 (L) >60 mL/min   GFR calc Af Amer >60 >60 mL/min   Anion gap 10 5 - 15    Comment: Performed at [facility], [address] [place], [place] [address]   Lipase, blood     Status: None   Collection Time: 04/19/19  1:18 AM  Result Value Ref Range   Lipase 23 11 - 51 U/L    Comment: Performed at [facility], [address], [place], [place] [address]  Urinalysis, Routine w reflex microscopic     Status: None   Collection Time: 04/19/19  1:18 AM  Result Value Ref Range   Color, Urine YELLOW YELLOW   APPearance CLEAR CLEAR   Specific Gravity, Urine 1.017 1.005 - 1.030   pH 5.0 5.0 - 8.0   Glucose, UA NEGATIVE NEGATIVE mg/dL   Hgb urine dipstick NEGATIVE NEGATIVE   Bilirubin Urine NEGATIVE NEGATIVE   Ketones, ur NEGATIVE NEGATIVE mg/dL   Protein, ur NEGATIVE NEGATIVE mg/dL   Nitrite NEGATIVE NEGATIVE   Leukocytes,Ua NEGATIVE NEGATIVE    Comment: Performed at [facility], [address] [place], [place] [address]    Chemistries  Recent Labs  Lab 04/19/19 0118  NA 134*  K 3.2*  CL 96*  CO2 28  GLUCOSE 129*  BUN 25*  CREATININE 0.98  CALCIUM 8.9  AST 40  ALT 38  ALKPHOS 64  BILITOT 0.4   ------------------------------------------------------------------------------------------------------------------  ------------------------------------------------------------------------------------------------------------------ GFR: Estimated Creatinine Clearance: 37 mL/min (by C-G formula based on SCr of 0.98 mg/dL). Liver Function Tests: Recent Labs  Lab 04/19/19 0118  AST 40  ALT 38  ALKPHOS 64  BILITOT 0.4  PROT 7.2  ALBUMIN 3.7   Recent Labs  Lab 04/19/19 0118  LIPASE 23   No results for input(s): AMMONIA in the last 168 hours. Coagulation Profile: No results for input(s): INR, PROTIME in the last 168 hours. Cardiac Enzymes: No results for input(s): CKTOTAL, CKMB, CKMBINDEX, TROPONINI in the last 168 hours. BNP (last 3 results) No results for input(s): PROBNP in the last 8760 hours. HbA1C: No results for input(s): HGBA1C in the last 72 hours. CBG: No results for input(s):  GLUCAP in the last 168 hours. Lipid Profile: No results for input(s): CHOL, HDL, LDLCALC, TRIG, CHOLHDL, LDLDIRECT in the last 72 hours. Thyroid Function Tests: No results for input(s): TSH, T4TOTAL, FREET4, T3FREE, THYROIDAB in the last 72 hours. Anemia Panel: No results for input(s): VITAMINB12, FOLATE, FERRITIN, TIBC, IRON, RETICCTPCT in the last 72 hours.  --------------------------------------------------------------------------------------------------------------- Urine analysis:    Component Value Date/Time   COLORURINE YELLOW 04/19/2019 0118   APPEARANCEUR CLEAR 04/19/2019 0118   LABSPEC 1.017 04/19/2019 0118   PHURINE 5.0 04/19/2019 0118   GLUCOSEU NEGATIVE 04/19/2019 0118   HGBUR NEGATIVE 04/19/2019 0118   BILIRUBINUR NEGATIVE 04/19/2019 0118   KETONESUR NEGATIVE 04/19/2019 0118   PROTEINUR NEGATIVE 04/19/2019 0118   NITRITE NEGATIVE 04/19/2019 0118   LEUKOCYTESUR NEGATIVE 04/19/2019 0118      Imaging Results:    DG Abdomen 1 View  Result Date: 04/19/2019 CLINICAL DATA:  Constipation EXAM: ABDOMEN - 1 VIEW COMPARISON:  None. FINDINGS: Scattered large and small bowel gas is noted. Mild retained fecal material is noted predominately within right colon. No obstructive changes are seen. No free air is noted. No acute bony abnormality is seen. IMPRESSION: Retained fecal material within the right colon. Electronically Signed   By: Alcide Clever M.D.   On: 04/19/2019 00:38   CT ABDOMEN PELVIS W CONTRAST  Result Date: 04/19/2019 CLINICAL DATA:  Bowel obstruction. EXAM: CT ABDOMEN AND PELVIS WITH CONTRAST TECHNIQUE: Multidetector CT imaging of the abdomen and pelvis was performed using the standard protocol following bolus administration of intravenous contrast. CONTRAST:  75mL OMNIPAQUE IOHEXOL 300 MG/ML  SOLN COMPARISON:  None. FINDINGS: Lower chest: There is a 1.3 cm airspace opacity in the partially visualized lingula. The remaining lung bases are essentially clear.The heart size  is normal. Hepatobiliary: The liver is normal. Cholelithiasis without acute inflammation.There is no biliary ductal dilation. Pancreas: Normal contours without ductal dilatation. No peripancreatic fluid collection. Spleen: No splenic laceration or hematoma. Adrenals/Urinary Tract: --Adrenal glands: No adrenal hemorrhage. --Right kidney/ureter: No hydronephrosis or perinephric hematoma. --Left kidney/ureter: No hydronephrosis or perinephric hematoma. --Urinary bladder: There is a Foley catheter within the urinary bladder. Stomach/Bowel: --Stomach/Duodenum: No hiatal hernia or other gastric abnormality. Normal duodenal course and caliber. --Small bowel: No dilatation or inflammation. --Colon: There is a large amount of stool in the rectum. There is mild perirectal fat stranding. There is a large amount of stool in the ascending colon. --Appendix: Not reliably identified. Vascular/Lymphatic: Atherosclerotic calcification is present within the non-aneurysmal abdominal aorta, without hemodynamically significant stenosis. --No retroperitoneal lymphadenopathy. --No mesenteric lymphadenopathy. --No pelvic or inguinal lymphadenopathy. Reproductive: There is a possible 2.7 cm cystic lesion centered within the right ovary. Prominent pelvic veins are noted bilaterally. Other: No ascites or  free air. The abdominal wall is normal. Musculoskeletal. No acute displaced fractures. IMPRESSION: 1. There is a large amount of stool in the rectum with mild perirectal fat stranding, which may reflect stercoral colitis. 2. There is a large amount of stool in the ascending colon. 3. There is a possible 2.7 cm cystic lesion centered within the right ovary. A follow-up ultrasound is recommended in 6 months. 4. There is a 1.3 cm airspace opacity in the partially visualized lingula. This is favored to represent atelectasis however a three-month follow-up CT of the chest is recommended to confirm stability or resolution of this finding. 5.  Cholelithiasis without acute inflammation. Aortic Atherosclerosis (ICD10-I70.0). Electronically Signed   By: Katherine Mantle M.D.   On: 04/19/2019 03:29       Assessment & Plan:    Principal Problem:   Acute urinary retention Active Problems:   Fecal impaction (HCC)   Leukocytosis    Acute urinary retention Foley in place, consider removing later today Consider urology consult if not improved  Fecal impaction/ constipation Bowel regimen, including enema ordered by ED Monitor  Hyponatremia Hydrate with ns iv Check cmp at 9am  Hypokalemia Replete Check cmp at 9am  Leukocytosis Check cbc at 9am  Hypertension Cont Losartan Cont Amlodipine Cont Chlorthalidone, if her hypokalemia is persistent consider changing this medication.   H/o TIA Cont aspirin Cont Lipitor  Hypothyroidism Cont Levothyroxine  Essential tremor Cont Propranolol Cont Primidone  Anxiety/ Depression  Cont Zoloft  R ovary cystic lesion Pelvis and transvaginal ultrasound ordered, please follow up  1.3cm atelectasis ? Per radiology CT chest in 56months  DVT Prophylaxis-   Lovenox - SCDs   AM Labs Ordered, also please review Full Orders  Family Communication: Admission, patients condition and plan of care including tests being ordered have been discussed with the patient who indicate understanding and agree with the plan and Code Status.  Code Status:  DNR per patient , notified granddaughter that pt admitted to [facility]  Admission status: Observation: Based on patients clinical presentation and evaluation of above clinical data, I have made determination that patient meets observation criteria at this time.   Time spent in minutes : 70 minutes   Pearson Grippe M.D on 04/19/2019 at 4:39 AM

## 2019-04-19 NOTE — Progress Notes (Signed)
I have reviewed HPI GP head plan of care as per my partner who admitted this patient this morning  11 white female ILF resdient [Brandy Hicks] hypothyroid, hypertension, prior Guillain-Barr, adjustment disorder with depressed mood, prior admission 04/24/2015 polypharmacy  Came to ED found to have acute urinary retention-CT scan revealed large amount of stool with perirectal fat stranding stercoral colitis?  2.7 L centimeter right ovary cyst and 1.3 cm opacity in the lung  White count 19 urine analysis however completely clean  Had a large stool this a.m. 2/2 in ED after fleets enema No fever no chills tells me he has not ever happened before-seems to understand Foley catheter management as her late husband had this Long discussion with the patient regarding clamping trial and 8 feels possible need to reevaluate for need of Foley catheter  Ovarian ultrasound is still pending-correcting hypokalemia and mild prerenal azotemia with IV fluids today--might need to adjust her chlorthalidone Another cause for constipation could be her calcium tablets and may be hypothyroid but I would check thyroid levels as an outpatient at steady state  We will keep as observation status overnight and ensure that we recheck white count to rule out infection, get ovarian ultrasound, correct electrolyte imbalances  If she needs a Foley catheter-for urodynamic studies would need to be trialed in the outpatient setting and this would need to be coordinated with a [facility]rologist with [facility] and can be facilitated by her PCP  Pleas Koch, MD [facility] 8:26 AM

## 2019-04-19 NOTE — ED Notes (Signed)
Patient transported to CT 

## 2019-04-19 NOTE — ED Provider Notes (Signed)
[facility]-EMERGENCY DEPT Provider Note   CSN: [id number] Arrival date & time: 04/18/19  2325     History Chief Complaint  Patient presents with  . Urinary Retention    Brandy Hicks is a 86 y.o. female.  HPI     This is an 86 year old female with a history of Guillain-Barr, hypertension, hyperlipidemia, TIA who presents with urinary retention.  Patient reports that she has not had any urinary output since this morning.  No known history of urinary retention.  She describes pressure in her lower abdomen but no pain.  She states that she has not had a good bowel movement in 6 days.  At baseline she takes Metamucil but has previously had to take MiraLAX regularly for constipation.  She states that she took an enema yesterday with minimal output.  She states she has had a couple of very small hard bowel movements within the last 6 days but nothing that she would describe as normal.  She denies any nausea, vomiting, abdominal pain.  Denies any recent fevers, cough, chest pain, shortness of breath.  Past Medical History:  Diagnosis Date  . Depression   . Guillain Barr syndrome (HCC)   . Hormone replacement therapy   . Hyperlipidemia   . Hypertension   . Hypothyroid   . Osteoporosis   . Rosacea   . TIA (transient ischemic attack)   . Tremor     Patient Active Problem List   Diagnosis Date Noted  . Voice tremor 10/13/2017  . GBS (Guillain-Barre syndrome) (HCC) 07/31/2015  . History of Guillain-Barre syndrome 05/02/2015  . Anxiety state 05/02/2015  . Panic attack 05/02/2015  . Essential tremor 05/02/2015  . Altered mental status   . UTI (urinary tract infection) 04/22/2015  . Acute kidney injury (HCC) 04/22/2015  . Essential hypertension, benign 11/28/2013  . Depression 04/09/2011  . Insomnia 04/09/2011    Past Surgical History:  Procedure Laterality Date  . FACIAL COSMETIC SURGERY    . HEEL SPUR SURGERY    . TONSILLECTOMY       OB  History   No obstetric history on file.     Family History  Problem Relation Age of Onset  . Stroke Mother   . Hypertension Father   . CAD Father   . Prostate cancer Father     Social History   Tobacco Use  . Smoking status: Former Games developer  . Smokeless tobacco: Never Used  Substance Use Topics  . Alcohol use: No  . Drug use: No    Home Medications Prior to Admission medications   Medication Sig Start Date End Date Taking? Authorizing Provider  amLODipine (NORVASC) 10 MG tablet Take 10 mg by mouth daily.     [provider]  aspirin 81 MG tablet Take 81 mg by mouth daily.    [provider]  atorvastatin (LIPITOR) 40 MG tablet Take 40 mg by mouth daily.    [provider]  CALCIUM PO Take 125 mg by mouth daily.    [provider]  chlorthalidone (HYGROTON) 25 MG tablet 50 mg daily. 05/21/15   [provider]  Cholecalciferol (VITAMIN D3) 2000 units capsule Take 2,000 Units by mouth daily.    [provider]  gabapentin (NEURONTIN) 300 MG capsule Take 1 capsule (300 mg total) by mouth 3 (three) times daily. 07/30/15   Anson Fret, MD  levothyroxine (SYNTHROID, LEVOTHROID) 125 MCG tablet  07/06/16   [provider]  losartan (COZAAR) 100 MG tablet  06/20/16   [provider]  Melatonin 3 MG TABS Take 3 mg by mouth at bedtime.    [provider]  Multiple Vitamins-Minerals (MULTIVITAMIN ADULT PO) Take 1 tablet by mouth daily.    [provider]  primidone (MYSOLINE) 50 MG tablet Take 1 tablet (50 mg total) by mouth daily. Patient taking differently: Take 100 mg by mouth daily.  04/24/15   Loleta Chance, MD  propranolol ER (INDERAL LA) 60 MG 24 hr capsule TAKE ONE CAPSULE BY MOUTH EVERY NIGHT AT BEDTIME 07/24/16   Melvenia Beam, MD  QUEtiapine (SEROQUEL) 25 MG tablet Take 1 tablet (25 mg total) by mouth at bedtime as needed. 04/24/15   Loleta Chance, MD  sertraline (ZOLOFT) 50 MG tablet 50 mg  daily. 05/21/15   [provider]    Allergies    Hydroquinone  Review of Systems   Review of Systems  Constitutional: Negative for fever.  Respiratory: Negative for shortness of breath.   Cardiovascular: Negative for chest pain.  Gastrointestinal: Positive for constipation. Negative for abdominal pain, diarrhea, nausea and vomiting.  Genitourinary: Positive for difficulty urinating. Negative for hematuria and urgency.  Neurological: Negative for dizziness.  All other systems reviewed and are negative.   Physical Exam Updated Vital Signs BP (!) 143/50   Pulse 70   Temp 99.1 F (37.3 C) (Oral)   Resp 18   Ht 1.562 m (5' 1.5")   Wt 63.5 kg   SpO2 91%   BMI 26.02 kg/m   Physical Exam Vitals and nursing note reviewed.  Constitutional:      Appearance: She is well-developed. She is obese.  HENT:     Head: Normocephalic and atraumatic.     Nose: Nose normal.     Mouth/Throat:     Mouth: Mucous membranes are moist.  Eyes:     Pupils: Pupils are equal, round, and reactive to light.  Cardiovascular:     Rate and Rhythm: Normal rate and regular rhythm.     Heart sounds: Normal heart sounds.  Pulmonary:     Effort: Pulmonary effort is normal. No respiratory distress.     Breath sounds: No wheezing.  Abdominal:     General: Bowel sounds are normal.     Palpations: Abdomen is soft.     Tenderness: There is no abdominal tenderness. There is no guarding or rebound.  Genitourinary:    Comments: Multiple external hemorrhoids noted, nonthrombosed, high hard fecal impaction noted, unable to manually disimpact Musculoskeletal:     Cervical back: Neck supple.     Right lower leg: No edema.     Left lower leg: No edema.  Skin:    General: Skin is warm and dry.  Neurological:     Mental Status: She is alert and oriented to person, place, and time.  Psychiatric:        Mood and Affect: Mood normal.     ED Results / Procedures / Treatments   Labs (all labs ordered  are listed, but only abnormal results are displayed) Labs Reviewed  CBC WITH DIFFERENTIAL/PLATELET - Abnormal; Notable for the following components:      Result Value   WBC 19.1 (*)    Neutro Abs 15.5 (*)    Monocytes Absolute 1.2 (*)    Abs Immature Granulocytes 0.23 (*)    All other components within normal limits  COMPREHENSIVE METABOLIC PANEL - Abnormal; Notable for the following components:   Sodium 134 (*)  Potassium 3.2 (*)    Chloride 96 (*)    Glucose, Bld 129 (*)    BUN 25 (*)    GFR calc non Af Amer 53 (*)    All other components within normal limits  LIPASE, BLOOD  URINALYSIS, ROUTINE W REFLEX MICROSCOPIC    EKG None  Radiology DG Abdomen 1 View  Result Date: 04/19/2019 CLINICAL DATA:  Constipation EXAM: ABDOMEN - 1 VIEW COMPARISON:  None. FINDINGS: Scattered large and small bowel gas is noted. Mild retained fecal material is noted predominately within right colon. No obstructive changes are seen. No free air is noted. No acute bony abnormality is seen. IMPRESSION: Retained fecal material within the right colon. Electronically Signed   By: Alcide Clever M.D.   On: 04/19/2019 00:38   CT ABDOMEN PELVIS W CONTRAST  Result Date: 04/19/2019 CLINICAL DATA:  Bowel obstruction. EXAM: CT ABDOMEN AND PELVIS WITH CONTRAST TECHNIQUE: Multidetector CT imaging of the abdomen and pelvis was performed using the standard protocol following bolus administration of intravenous contrast. CONTRAST:  66mL OMNIPAQUE IOHEXOL 300 MG/ML  SOLN COMPARISON:  None. FINDINGS: Lower chest: There is a 1.3 cm airspace opacity in the partially visualized lingula. The remaining lung bases are essentially clear.The heart size is normal. Hepatobiliary: The liver is normal. Cholelithiasis without acute inflammation.There is no biliary ductal dilation. Pancreas: Normal contours without ductal dilatation. No peripancreatic fluid collection. Spleen: No splenic laceration or hematoma. Adrenals/Urinary Tract:  --Adrenal glands: No adrenal hemorrhage. --Right kidney/ureter: No hydronephrosis or perinephric hematoma. --Left kidney/ureter: No hydronephrosis or perinephric hematoma. --Urinary bladder: There is a Foley catheter within the urinary bladder. Stomach/Bowel: --Stomach/Duodenum: No hiatal hernia or other gastric abnormality. Normal duodenal course and caliber. --Small bowel: No dilatation or inflammation. --Colon: There is a large amount of stool in the rectum. There is mild perirectal fat stranding. There is a large amount of stool in the ascending colon. --Appendix: Not reliably identified. Vascular/Lymphatic: Atherosclerotic calcification is present within the non-aneurysmal abdominal aorta, without hemodynamically significant stenosis. --No retroperitoneal lymphadenopathy. --No mesenteric lymphadenopathy. --No pelvic or inguinal lymphadenopathy. Reproductive: There is a possible 2.7 cm cystic lesion centered within the right ovary. Prominent pelvic veins are noted bilaterally. Other: No ascites or free air. The abdominal wall is normal. Musculoskeletal. No acute displaced fractures. IMPRESSION: 1. There is a large amount of stool in the rectum with mild perirectal fat stranding, which may reflect stercoral colitis. 2. There is a large amount of stool in the ascending colon. 3. There is a possible 2.7 cm cystic lesion centered within the right ovary. A follow-up ultrasound is recommended in 6 months. 4. There is a 1.3 cm airspace opacity in the partially visualized lingula. This is favored to represent atelectasis however a three-month follow-up CT of the chest is recommended to confirm stability or resolution of this finding. 5. Cholelithiasis without acute inflammation. Aortic Atherosclerosis (ICD10-I70.0). Electronically Signed   By: Katherine Mantle M.D.   On: 04/19/2019 03:29    Procedures Procedures (including critical care time)  Medications Ordered in ED Medications  sodium chloride (PF) 0.9 %  injection (has no administration in time range)  sodium phosphate (FLEET) 7-19 GM/118ML enema 1 enema (has no administration in time range)  polyethylene glycol-electrolytes (NuLYTELY) solution 4,000 mL (has no administration in time range)  iohexol (OMNIPAQUE) 300 MG/ML solution 100 mL (80 mLs Intravenous Contrast Given 04/19/19 0305)    ED Course  I have reviewed the triage vital signs and the nursing notes.  Pertinent labs &  imaging results that were available during my care of the patient were reviewed by me and considered in my medical decision making (see chart for details).    MDM Rules/Calculators/A&P                       Patient presents with urinary retention.  Also reports constipation.  History of constipation in the past.  She is overall nontoxic-appearing vital signs are reassuring.  She is afebrile.  Her abdomen is soft and nontender.  Lab work obtained.  She had over 500 cc in her bladder.  Foley was placed.  No history of urinary retention in the past.  May be related to her constipation.  Urinalysis without evidence of UTI.  Plain films do not show any obstructive process but does show retained fecal matter.  White count 19.1.  Not septic.  Or ill-appearing.  However, given constipation and urinary retention, will obtain CT scan of the abdomen for further evaluation.  CT scan shows evidence of fecal impaction with likely stercoral colitis.  I am unable to manually disimpact her.  She did try an enema at home with no relief.  Given the high morbidity associated with stercoral colitis and her urinary retention which is likely associated as well, will admit for aggressive bowel regimen.  Enema and GoLYTELY ordered.   Final Clinical Impression(s) / ED Diagnoses Final diagnoses:  Urinary retention  Fecal impaction in rectum [facility])  Stercoral Colitis  Rx / DC Orders ED Discharge Orders    None       Shon Baton, MD 04/19/19 [phone]

## 2019-04-20 DIAGNOSIS — K5641 Fecal impaction: Secondary | ICD-10-CM | POA: Diagnosis not present

## 2019-04-20 DIAGNOSIS — R338 Other retention of urine: Secondary | ICD-10-CM | POA: Diagnosis not present

## 2019-04-20 LAB — CBC WITH DIFFERENTIAL/PLATELET
Abs Immature Granulocytes: 0.05 10*3/uL (ref 0.00–0.07)
Basophils Absolute: 0.1 10*3/uL (ref 0.0–0.1)
Basophils Relative: 1 %
Eosinophils Absolute: 0.4 10*3/uL (ref 0.0–0.5)
Eosinophils Relative: 4 %
HCT: 35.1 % — ABNORMAL LOW (ref 36.0–46.0)
Hemoglobin: 11.9 g/dL — ABNORMAL LOW (ref 12.0–15.0)
Immature Granulocytes: 1 %
Lymphocytes Relative: 27 %
Lymphs Abs: 2.9 10*3/uL (ref 0.7–4.0)
MCH: 32.6 pg (ref 26.0–34.0)
MCHC: 33.9 g/dL (ref 30.0–36.0)
MCV: 96.2 fL (ref 80.0–100.0)
Monocytes Absolute: 1.2 10*3/uL — ABNORMAL HIGH (ref 0.1–1.0)
Monocytes Relative: 11 %
Neutro Abs: 6.4 10*3/uL (ref 1.7–7.7)
Neutrophils Relative %: 56 %
Platelets: 241 10*3/uL (ref 150–400)
RBC: 3.65 MIL/uL — ABNORMAL LOW (ref 3.87–5.11)
RDW: 13.8 % (ref 11.5–15.5)
WBC: 11 10*3/uL — ABNORMAL HIGH (ref 4.0–10.5)
nRBC: 0 % (ref 0.0–0.2)

## 2019-04-20 LAB — COMPREHENSIVE METABOLIC PANEL
ALT: 28 U/L (ref 0–44)
AST: 30 U/L (ref 15–41)
Albumin: 3.2 g/dL — ABNORMAL LOW (ref 3.5–5.0)
Alkaline Phosphatase: 48 U/L (ref 38–126)
Anion gap: 11 (ref 5–15)
BUN: 21 mg/dL (ref 8–23)
CO2: 27 mmol/L (ref 22–32)
Calcium: 8.5 mg/dL — ABNORMAL LOW (ref 8.9–10.3)
Chloride: 102 mmol/L (ref 98–111)
Creatinine, Ser: 1.01 mg/dL — ABNORMAL HIGH (ref 0.44–1.00)
GFR calc Af Amer: 59 mL/min — ABNORMAL LOW (ref >60)
GFR calc non Af Amer: 51 mL/min — ABNORMAL LOW (ref >60)
Glucose, Bld: 102 mg/dL — ABNORMAL HIGH (ref 70–99)
Potassium: 3.2 mmol/L — ABNORMAL LOW (ref 3.5–5.1)
Sodium: 140 mmol/L (ref 135–145)
Total Bilirubin: 0.8 mg/dL (ref 0.3–1.2)
Total Protein: 5.9 g/dL — ABNORMAL LOW (ref 6.5–8.1)

## 2019-04-20 MED ORDER — PRIMIDONE 50 MG PO TABS: 100.0000 mg | ORAL_TABLET | Freq: Two times a day (BID) | ORAL | 0 refills | Status: DC

## 2019-04-20 MED ORDER — CHLORTHALIDONE 25 MG PO TABS: 25.0000 mg | ORAL_TABLET | Freq: Every day | ORAL | 0 refills | Status: DC

## 2019-04-20 MED ORDER — POTASSIUM CHLORIDE CRYS ER 20 MEQ PO TBCR
40.0000 meq | EXTENDED_RELEASE_TABLET | Freq: Once | ORAL | Status: AC
  Administered 2019-04-20: 10:00:00 40 meq via ORAL
  Filled 2019-04-20: qty 2

## 2019-04-20 MED ORDER — POLYETHYLENE GLYCOL 3350 17 G PO PACK: 17.0000 g | PACK | Freq: Every day | ORAL | Status: DC | PRN

## 2019-04-20 MED ORDER — POLYETHYLENE GLYCOL 3350 17 G PO PACK: 17.0000 g | PACK | Freq: Every day | ORAL | 0 refills | Status: DC | PRN

## 2019-04-20 NOTE — Discharge Summary (Signed)
Physician Discharge Summary  Brandy FuchsBeverly Hicks Van Der Hicks WUJ:[id number] DOB: 06/21/1932 DOA: 04/18/2019  PCP: Brandy Davenportichter, Karen L, MD  Admit date: 04/18/2019 Discharge date: 04/20/2019   Code Status: DNR  Admitted [place]: [place] Discharged to: [facility] Home Health: No Equipment/Devices: None Discharge Condition: Stable  Recommendations for Outpatient Follow-up   1. Follow up with PCP in 1 week 2. Chlorthalidone decreased due to hypokalemia, consider hyperaldosterone work-up 3. Patient will need close follow-up and work-up for right ovarian cyst, consider MRI outpatient 4. Primidone increased   Hospital Summary  86 year old female with history of hypertension, hyperlipidemia, hypothyroidism, Guillain-Barr, essential tremor on primidone who presented with urinary retention x1 day and constipation without any other complaints.  CT abdomen pelvis with large amount of stool in rectum with mild perirectal fat stranding concerning for stercoral colitis and 2.7 cm cystic lesion in the right ovary.  Patient had Foley catheter placed and had successful void trial.  Patient was started on laxatives and enemas with resolution of constipation.  Urinary retention of unknown etiology had resolved.  She had a pelvic ultrasound which did show a complex lesion within the right ovary without internal vascularity and was recommended for either transvaginal ultrasound or pelvic MRI on an outpatient basis.  Additionally, patient had hypokalemia which was replaced on multiple occasions, possibly from chlorthalidone.  Chlorthalidone was decreased in half at discharge and recommended to follow-up outpatient with repeat labs.    Patient was discharged in stable condition  A & P   Principal Problem:   Acute urinary retention Active Problems:   Fecal impaction (HCC)   Leukocytosis   Urinary retention   Hyponatremia   Hypokalemia  1. Acute urinary retention of unknown etiology, resolved 1. Consider  urologic work-up if this recurs 2. Fecal impaction/constipation concerning for stercoral colitis 1. Had leukocytosis 19->> 11 2. Resolved with laxatives 3. Discharged with MiraLAX as needed 3. Right ovarian complex cyst 1. Initially found on CT abdomen pelvis and had pelvic ultrasound which confirmed 2.6 cm complex cystic lesion in right ovary without internal vascularity 2. Recommend outpatient MRI or transvaginal ultrasound and close outpatient follow-up 4. Hypokalemia 1. Repleted 2. Chlorthalidone decreased, recommend outpatient follow-up 5. Hypertension 1. Continue losartan, amlodipine and decreased chlorthalidone 6. Hypothyroidism stable on levothyroxine 7. Essential tremor 1. Continue propranolol 2. Primidone increased this hospitalization 8. Anxiety/depression controlled on Zoloft 9. 1.3 cm airspace opacity in the lingula 1. Consider 6321-month chest CT follow-up    Consultants  . None  Procedures  . Foley, discontinued  Antibiotics  None   Subjective  Patient seen and examined at bedside no acute distress and resting comfortably.  No events overnight.  Tolerating diet. In good spirits and anticipating discharge.   Denies any chest pain, shortness of breath, fever, nausea, vomiting, urinary or bowel complaints. Otherwise ROS negative   Objective   Discharge Exam: Vitals:   04/19/19 2107 04/20/19 0633  BP: 123/67 (!) 112/45  Pulse: 70 62  Resp: 17 17  Temp: 98.4 F (36.9 C) 98.1 F (36.7 C)  SpO2: 94% 91%   Vitals:   04/19/19 0627 04/19/19 1331 04/19/19 2107 04/20/19 0633  BP: (!) 145/58 (!) 132/48 123/67 (!) 112/45  Pulse: 69 63 70 62  Resp: (!) 24 20 17 17   Temp: 98.3 F (36.8 C) 98.4 F (36.9 C) 98.4 F (36.9 C) 98.1 F (36.7 C)  TempSrc: Oral Oral Oral Oral  SpO2: 94% 92% 94% 91%  Weight:      Height:  Physical Exam Vitals and nursing note reviewed.  Constitutional:      Appearance: Normal appearance.  HENT:     Head: Normocephalic  and atraumatic.     Nose: Nose normal.     Mouth/Throat:     Mouth: Mucous membranes are moist.  Eyes:     Extraocular Movements: Extraocular movements intact.  Cardiovascular:     Rate and Rhythm: Normal rate and regular rhythm.  Pulmonary:     Effort: Pulmonary effort is normal.     Breath sounds: Normal breath sounds.  Abdominal:     General: Abdomen is flat.     Palpations: Abdomen is soft. There is no mass.  Musculoskeletal:        General: No swelling. Normal range of motion.     Cervical back: Normal range of motion. No rigidity.  Neurological:     General: No focal deficit present.     Mental Status: She is alert. Mental status is at baseline.     Comments: Baseline tremors  Psychiatric:        Mood and Affect: Mood normal.        Behavior: Behavior normal.       The results of significant diagnostics from this hospitalization (including imaging, microbiology, ancillary and laboratory) are listed below for reference.     Microbiology: Recent Results (from the past 240 hour(s))  SARS CORONAVIRUS 2 (TAT 6-24 HRS) Nasopharyngeal Nasopharyngeal Swab     Status: None   Collection Time: 04/19/19  5:11 AM   Specimen: Nasopharyngeal Swab  Result Value Ref Range Status   SARS Coronavirus 2 NEGATIVE NEGATIVE Final    Comment: (NOTE) SARS-CoV-2 target nucleic acids are NOT DETECTED. The SARS-CoV-2 RNA is generally detectable in upper and lower respiratory specimens during the acute phase of infection. Negative results do not preclude SARS-CoV-2 infection, do not rule out co-infections with other pathogens, and should not be used as the sole basis for treatment or other patient management decisions. Negative results must be combined with clinical observations, patient history, and epidemiological information. The expected result is Negative. Fact Sheet for Patients: HairSlick.no Fact Sheet for Healthcare  Providers: quierodirigir.com This test is not yet approved or cleared by the [place] FDA and  has been authorized for detection and/or diagnosis of SARS-CoV-2 by FDA under an Emergency Use Authorization (EUA). This EUA will remain  in effect (meaning this test can be used) for the duration of the COVID-19 declaration under Section 56 4(b)(1) of the Act, 21 U.S.C. section 360bbb-3(b)(1), unless the authorization is terminated or revoked sooner. Performed at [facility], [address] [place], [place] [address]      Labs: BNP (last 3 results) No results for input(s): BNP in the last 8760 hours. Basic Metabolic Panel: Recent Labs  Lab 04/19/19 0118 04/19/19 0835 04/20/19 0432  NA 134* 136 140  K 3.2* 3.8 3.2*  CL 96* 98 102  CO2 28 28 27   GLUCOSE 129* 125* 102*  BUN 25* 22 21  CREATININE 0.98 0.99 1.01*  CALCIUM 8.9 8.8* 8.5*   Liver Function Tests: Recent Labs  Lab 04/19/19 0118 04/20/19 0432  AST 40 30  ALT 38 28  ALKPHOS 64 48  BILITOT 0.4 0.8  PROT 7.2 5.9*  ALBUMIN 3.7 3.2*   Recent Labs  Lab 04/19/19 0118  LIPASE 23   No results for input(s): AMMONIA in the last 168 hours. CBC: Recent Labs  Lab 04/19/19 0118 04/19/19 06/17/19 04/20/19 06/18/19  WBC 19.1* 15.5* 11.0*  NEUTROABS 15.5*  --  6.4  HGB 12.8 13.1 11.9*  HCT 38.1 38.2 35.1*  MCV 94.5 95.7 96.2  PLT 290 283 241   Cardiac Enzymes: No results for input(s): CKTOTAL, CKMB, CKMBINDEX, TROPONINI in the last 168 hours. BNP: Invalid input(s): POCBNP CBG: No results for input(s): GLUCAP in the last 168 hours. D-Dimer No results for input(s): DDIMER in the last 72 hours. Hgb A1c No results for input(s): HGBA1C in the last 72 hours. Lipid Profile No results for input(s): CHOL, HDL, LDLCALC, TRIG, CHOLHDL, LDLDIRECT in the last 72 hours. Thyroid function studies No results for input(s): TSH, T4TOTAL, T3FREE, THYROIDAB in the last 72 hours.  Invalid input(s):  FREET3 Anemia work up No results for input(s): VITAMINB12, FOLATE, FERRITIN, TIBC, IRON, RETICCTPCT in the last 72 hours. Urinalysis    Component Value Date/Time   COLORURINE YELLOW 04/19/2019 0118   APPEARANCEUR CLEAR 04/19/2019 0118   LABSPEC 1.017 04/19/2019 0118   PHURINE 5.0 04/19/2019 0118   GLUCOSEU NEGATIVE 04/19/2019 0118   HGBUR NEGATIVE 04/19/2019 0118   BILIRUBINUR NEGATIVE 04/19/2019 0118   KETONESUR NEGATIVE 04/19/2019 0118   PROTEINUR NEGATIVE 04/19/2019 0118   NITRITE NEGATIVE 04/19/2019 0118   LEUKOCYTESUR NEGATIVE 04/19/2019 0118   Sepsis Labs Invalid input(s): PROCALCITONIN,  WBC,  LACTICIDVEN Microbiology Recent Results (from the past 240 hour(s))  SARS CORONAVIRUS 2 (TAT 6-24 HRS) Nasopharyngeal Nasopharyngeal Swab     Status: None   Collection Time: 04/19/19  5:11 AM   Specimen: Nasopharyngeal Swab  Result Value Ref Range Status   SARS Coronavirus 2 NEGATIVE NEGATIVE Final    Comment: (NOTE) SARS-CoV-2 target nucleic acids are NOT DETECTED. The SARS-CoV-2 RNA is generally detectable in upper and lower respiratory specimens during the acute phase of infection. Negative results do not preclude SARS-CoV-2 infection, do not rule out co-infections with other pathogens, and should not be used as the sole basis for treatment or other patient management decisions. Negative results must be combined with clinical observations, patient history, and epidemiological information. The expected result is Negative. Fact Sheet for Patients: HairSlick.no Fact Sheet for Healthcare Providers: quierodirigir.com This test is not yet approved or cleared by the [place] FDA and  has been authorized for detection and/or diagnosis of SARS-CoV-2 by FDA under an Emergency Use Authorization (EUA). This EUA will remain  in effect (meaning this test can be used) for the duration of the COVID-19 declaration under Section 56  4(b)(1) of the Act, 21 U.S.C. section 360bbb-3(b)(1), unless the authorization is terminated or revoked sooner. Performed at [facility], [address] [place], [place] [address]     Discharge Instructions     Discharge Instructions    Diet - low sodium heart healthy   Complete by: As directed    Discharge instructions   Complete by: As directed    You were seen and examined in the hospital for urinary retention and constipation which resolved and cared for by a hospitalist.   Upon Discharge:  -Increase primidone to 200 mg twice daily -Take MiraLAX daily as needed for mild to moderate constipation -Decrease your chlorthalidone dose by half to 25 mg daily (1 tablet) -Get lab work in 1 week  -Make an appointment with your primary care physician within the next week to discuss your ultrasound results and consider further work-up of your ovarian cyst Get lab work prior to your follow up appointment with your PCP Bring all home medications to your appointment to  review Request that your primary physician go over all hospital tests and procedures/radiological results at the follow up.   Please get all hospital records sent to your physician by signing a hospital release before you go home.     Read the complete instructions along with all the possible side effects for all the medicines you take and that have been prescribed to you. Take any new medicines after you have completely understood and accept all the possible adverse reactions/side effects.   If you have any questions about your discharge medications or the care you received while you were in the hospital, you can call the unit and asked to speak with the hospitalist on call. Once you are discharged, your primary care physician will handle any further medical issues. Please note that NO REFILLS for any discharge medications will be authorized, as it is imperative that you return to your primary care physician (or  establish a relationship with a primary care physician if you do not have one) for your aftercare needs so that they can reassess your need for medications and monitor your lab values.   Do not drive, operate heavy machinery, perform activities at heights, swimming or participation in water activities or provide baby sitting services if your were admitted for loss of consciousness/seizures or if you are on sedating medications including, but not limited to benzodiazepines, sleep medications, narcotic pain medications, etc., until you have been cleared to do so by a medical doctor.   Do not take more than prescribed medications.   Wear a seat belt while driving.  If you have smoked or chewed Tobacco in the last 2 years please stop smoking; also stop any regular Alcohol and/or any Recreational drug use including marijuana.  If you experience worsening of your admission symptoms or develop shortness of breath, chest pain, suicidal or homicidal thoughts or experience a life threatening emergency, you must seek medical attention immediately by calling 911 or calling your PCP immediately.   Increase activity slowly   Complete by: As directed      Allergies as of 04/20/2019      Reactions   Hydroquinone    Pinkness and edema of face and eyelids, severe      Medication List    TAKE these medications   amLODipine 10 MG tablet Commonly known as: NORVASC Take 10 mg by mouth daily.   aspirin 81 MG tablet Take 81 mg by mouth daily.   atorvastatin 40 MG tablet Commonly known as: LIPITOR Take 40 mg by mouth daily.   calcium-vitamin D 500-200 MG-UNIT tablet Commonly known as: OSCAL WITH D Take 1 tablet by mouth daily with breakfast.   chlorthalidone 25 MG tablet Commonly known as: HYGROTON Take 1 tablet (25 mg total) by mouth daily. What changed: how much to take   gabapentin 300 MG capsule Commonly known as: NEURONTIN Take 1 capsule (300 mg total) by mouth 3 (three) times daily.    levothyroxine 137 MCG tablet Commonly known as: SYNTHROID Take 137 mcg by mouth daily.   losartan 100 MG tablet Commonly known as: COZAAR Take 100 mg by mouth daily.   Melatonin 3 MG Tabs Take 3 mg by mouth at bedtime.   MULTIVITAMIN ADULT PO Take 1 tablet by mouth daily.   polyethylene glycol 17 g packet Commonly known as: MIRALAX / GLYCOLAX Take 17 g by mouth daily as needed for mild constipation or moderate constipation.   primidone 50 MG tablet Commonly known as: MYSOLINE Take 2 tablets (  100 mg total) by mouth 2 (two) times daily. What changed:   how much to take  when to take this   propranolol ER 60 MG 24 hr capsule Commonly known as: INDERAL LA TAKE ONE CAPSULE BY MOUTH EVERY NIGHT AT BEDTIME What changed: when to take this   QUEtiapine 25 MG tablet Commonly known as: SEROQUEL Take 1 tablet (25 mg total) by mouth at bedtime as needed.   sertraline 50 MG tablet Commonly known as: ZOLOFT Take 50 mg by mouth daily.       Allergies  Allergen Reactions  . Hydroquinone     Pinkness and edema of face and eyelids, severe    Time coordinating discharge: Over 30 minutes   SIGNED:   Jae Dire, D.O. [facility] Pager: [phone]  04/20/2019, 1:43 PM

## 2019-04-20 NOTE — Progress Notes (Signed)
Pt discharged home with daughter in stable condition. Discharge instructions given. Script sent to pharmacy of choice. No immediate questions or concerns at this time. Pt discharged via wheelchair.

## 2019-05-10 ENCOUNTER — Other Ambulatory Visit: Payer: Self-pay | Admitting: Family Medicine

## 2019-05-10 DIAGNOSIS — I10 Essential (primary) hypertension: Secondary | ICD-10-CM

## 2019-05-10 DIAGNOSIS — E785 Hyperlipidemia, unspecified: Secondary | ICD-10-CM

## 2019-05-10 DIAGNOSIS — N838 Other noninflammatory disorders of ovary, fallopian tube and broad ligament: Secondary | ICD-10-CM

## 2019-05-10 DIAGNOSIS — H6123 Impacted cerumen, bilateral: Secondary | ICD-10-CM

## 2019-05-10 DIAGNOSIS — E039 Hypothyroidism, unspecified: Secondary | ICD-10-CM

## 2019-05-10 DIAGNOSIS — F39 Unspecified mood [affective] disorder: Secondary | ICD-10-CM

## 2019-05-11 ENCOUNTER — Other Ambulatory Visit: Payer: Self-pay | Admitting: Family Medicine

## 2019-05-11 ENCOUNTER — Ambulatory Visit
Admission: RE | Admit: 2019-05-11 | Discharge: 2019-05-11 | Disposition: A | Discharge status: Home / Self Care | Payer: Medicare Other | Source: Ambulatory Visit | Attending: Family Medicine | Admitting: Family Medicine

## 2019-05-11 DIAGNOSIS — N838 Other noninflammatory disorders of ovary, fallopian tube and broad ligament: Secondary | ICD-10-CM

## 2019-07-26 ENCOUNTER — Encounter: Payer: Self-pay | Admitting: Sports Medicine

## 2019-07-26 ENCOUNTER — Ambulatory Visit (INDEPENDENT_AMBULATORY_CARE_PROVIDER_SITE_OTHER): Payer: Medicare Other | Admitting: Sports Medicine

## 2019-07-26 ENCOUNTER — Other Ambulatory Visit: Payer: Self-pay

## 2019-07-26 VITALS — Temp 97.3°F

## 2019-07-26 DIAGNOSIS — G610 Guillain-Barre syndrome: Secondary | ICD-10-CM

## 2019-07-26 DIAGNOSIS — M79675 Pain in left toe(s): Secondary | ICD-10-CM

## 2019-07-26 DIAGNOSIS — B351 Tinea unguium: Secondary | ICD-10-CM

## 2019-07-26 DIAGNOSIS — M79674 Pain in right toe(s): Secondary | ICD-10-CM | POA: Diagnosis not present

## 2019-07-26 NOTE — Progress Notes (Signed)
Subjective: Brandy Hicks is a 86 y.o. female patient seen today in office with complaint of painful thickened toenails; unable to trim. Patient reports that things are the same except a few changes to meds of which she gave med list to nurse.  Patient has no other pedal complaints at this time.     Patient Active Problem List   Diagnosis Date Noted  . Fecal impaction ([place]) 04/19/2019  . Acute urinary retention 04/19/2019  . Leukocytosis 04/19/2019  . Urinary retention 04/19/2019  . Hyponatremia 04/19/2019  . Hypokalemia 04/19/2019  . Voice tremor 10/13/2017  . GBS (Guillain-Barre syndrome) ([place]) 07/31/2015  . History of Guillain-Barre syndrome 05/02/2015  . Anxiety state 05/02/2015  . Panic attack 05/02/2015  . Essential tremor 05/02/2015  . Altered mental status   . UTI (urinary tract infection) 04/22/2015  . Acute kidney injury ([place]) 04/22/2015  . Essential hypertension, benign 11/28/2013  . Depression 04/09/2011  . Insomnia 04/09/2011    Current Outpatient Medications on File Prior to Visit  Medication Sig Dispense Refill  . amLODipine (NORVASC) 10 MG tablet Take 10 mg by mouth daily.     Marland Kitchen aspirin 81 MG tablet Take 81 mg by mouth daily.    Marland Kitchen atorvastatin (LIPITOR) 40 MG tablet Take 40 mg by mouth daily.    . calcium-vitamin D (OSCAL WITH D) 500-200 MG-UNIT tablet Take 1 tablet by mouth daily with breakfast.    . chlorthalidone (HYGROTON) 25 MG tablet Take 1 tablet (25 mg total) by mouth daily.  0  . gabapentin (NEURONTIN) 300 MG capsule Take 1 capsule (300 mg total) by mouth 3 (three) times daily. (Patient not taking: Reported on 04/19/2019) 90 capsule 11  . levothyroxine (SYNTHROID) 137 MCG tablet Take 137 mcg by mouth daily.    Marland Kitchen losartan (COZAAR) 100 MG tablet Take 100 mg by mouth daily.    . Melatonin 3 MG TABS Take 3 mg by mouth at bedtime.    . Multiple Vitamins-Minerals (MULTIVITAMIN ADULT PO) Take 1 tablet by mouth daily.    . polyethylene glycol (MIRALAX /  GLYCOLAX) 17 g packet Take 17 g by mouth daily as needed for mild constipation or moderate constipation. 14 each 0  . primidone (MYSOLINE) 50 MG tablet Take 2 tablets (100 mg total) by mouth 2 (two) times daily. 30 tablet 0  . propranolol ER (INDERAL LA) 60 MG 24 hr capsule TAKE ONE CAPSULE BY MOUTH EVERY NIGHT AT BEDTIME (Patient taking differently: Take 60 mg by mouth daily. ) 90 capsule 3  . QUEtiapine (SEROQUEL) 25 MG tablet Take 1 tablet (25 mg total) by mouth at bedtime as needed. (Patient not taking: Reported on 04/19/2019) 30 tablet 0  . sertraline (ZOLOFT) 50 MG tablet Take 50 mg by mouth daily.   0   No current facility-administered medications on file prior to visit.    Allergies  Allergen Reactions  . Hydroquinone     Pinkness and edema of face and eyelids, severe    Objective: Physical Exam  General: Well developed, nourished, no acute distress, awake, alert and oriented x 3  Vascular: Dorsalis pedis artery 1/4 bilateral, Posterior tibial artery 1/4 bilateral, skin temperature warm to warm proximal to distal bilateral lower extremities, no varicosities, pedal hair present bilateral.  Neurological: Gross sensation present via light touch bilateral.   Dermatological: Skin is warm, dry, and supple bilateral, Bilateral hallux nails are short, thick, with moderate subungal debris, all other nails are mildly elongated and minimal debris,  no webspace macerations present bilateral, no open lesions present bilateral, no callus/corns/hyperkeratotic tissue present bilateral. No signs of infection bilateral.  Musculoskeletal: No symptomatic boney deformities noted bilateral. Muscular strength within normal limits without painon range of motion. No pain with calf compression bilateral.  Assessment and Plan:  Problem List Items Addressed This Visit      Nervous and Auditory   GBS (Guillain-Barre syndrome) (HCC)    Other Visit Diagnoses    Pain due to onychomycosis of toenails of both  feet    -  Primary      -Examined patient.  -ABN signed and copy provided to the patient  -Re-Discussed treatment options for painful mycotic nails and pain to toes. -Mechanically debrided and reduced mycotic nails with sterile nail nipper and dremel nail file without incident. -Recommend to try vicks vapor rub to nails especially hallux nails  -Patient to return in 6 months for follow up evaluation or sooner if symptoms worsen.  Asencion Islam, DPM

## 2020-01-02 ENCOUNTER — Inpatient Hospital Stay (HOSPITAL_COMMUNITY)
Admission: EM | Admit: 2020-01-02 | Discharge: 2020-01-07 | DRG: 418 | Disposition: A | Discharge status: Home Health | Payer: Medicare Other | Source: Skilled Nursing Facility | Attending: Internal Medicine | Admitting: Internal Medicine

## 2020-01-02 ENCOUNTER — Emergency Department (HOSPITAL_COMMUNITY): Payer: Medicare Other

## 2020-01-02 ENCOUNTER — Encounter (HOSPITAL_COMMUNITY): Payer: Self-pay

## 2020-01-02 DIAGNOSIS — F32A Depression, unspecified: Secondary | ICD-10-CM | POA: Diagnosis present

## 2020-01-02 DIAGNOSIS — K810 Acute cholecystitis: Secondary | ICD-10-CM | POA: Diagnosis not present

## 2020-01-02 DIAGNOSIS — Z8673 Personal history of transient ischemic attack (TIA), and cerebral infarction without residual deficits: Secondary | ICD-10-CM

## 2020-01-02 DIAGNOSIS — Z7982 Long term (current) use of aspirin: Secondary | ICD-10-CM

## 2020-01-02 DIAGNOSIS — D72828 Other elevated white blood cell count: Secondary | ICD-10-CM | POA: Diagnosis present

## 2020-01-02 DIAGNOSIS — Z79890 Hormone replacement therapy (postmenopausal): Secondary | ICD-10-CM

## 2020-01-02 DIAGNOSIS — M810 Age-related osteoporosis without current pathological fracture: Secondary | ICD-10-CM | POA: Diagnosis present

## 2020-01-02 DIAGNOSIS — K828 Other specified diseases of gallbladder: Secondary | ICD-10-CM | POA: Diagnosis present

## 2020-01-02 DIAGNOSIS — E875 Hyperkalemia: Secondary | ICD-10-CM | POA: Diagnosis not present

## 2020-01-02 DIAGNOSIS — Z66 Do not resuscitate: Secondary | ICD-10-CM | POA: Diagnosis present

## 2020-01-02 DIAGNOSIS — K8012 Calculus of gallbladder with acute and chronic cholecystitis without obstruction: Secondary | ICD-10-CM | POA: Diagnosis not present

## 2020-01-02 DIAGNOSIS — R079 Chest pain, unspecified: Secondary | ICD-10-CM | POA: Diagnosis present

## 2020-01-02 DIAGNOSIS — E782 Mixed hyperlipidemia: Secondary | ICD-10-CM | POA: Diagnosis present

## 2020-01-02 DIAGNOSIS — R1013 Epigastric pain: Secondary | ICD-10-CM

## 2020-01-02 DIAGNOSIS — Z87891 Personal history of nicotine dependence: Secondary | ICD-10-CM

## 2020-01-02 DIAGNOSIS — R109 Unspecified abdominal pain: Secondary | ICD-10-CM

## 2020-01-02 DIAGNOSIS — I10 Essential (primary) hypertension: Secondary | ICD-10-CM | POA: Diagnosis present

## 2020-01-02 DIAGNOSIS — Z79899 Other long term (current) drug therapy: Secondary | ICD-10-CM

## 2020-01-02 DIAGNOSIS — E039 Hypothyroidism, unspecified: Secondary | ICD-10-CM | POA: Diagnosis present

## 2020-01-02 DIAGNOSIS — N179 Acute kidney failure, unspecified: Secondary | ICD-10-CM | POA: Diagnosis present

## 2020-01-02 DIAGNOSIS — E876 Hypokalemia: Secondary | ICD-10-CM | POA: Diagnosis present

## 2020-01-02 DIAGNOSIS — K82A1 Gangrene of gallbladder in cholecystitis: Secondary | ICD-10-CM | POA: Diagnosis present

## 2020-01-02 DIAGNOSIS — G250 Essential tremor: Secondary | ICD-10-CM | POA: Diagnosis not present

## 2020-01-02 DIAGNOSIS — R918 Other nonspecific abnormal finding of lung field: Secondary | ICD-10-CM | POA: Diagnosis present

## 2020-01-02 DIAGNOSIS — Z20822 Contact with and (suspected) exposure to covid-19: Secondary | ICD-10-CM | POA: Diagnosis present

## 2020-01-02 DIAGNOSIS — K8000 Calculus of gallbladder with acute cholecystitis without obstruction: Secondary | ICD-10-CM

## 2020-01-02 LAB — HEPATIC FUNCTION PANEL
ALT: 24 U/L (ref 0–44)
AST: 32 U/L (ref 15–41)
Albumin: 3.6 g/dL (ref 3.5–5.0)
Alkaline Phosphatase: 73 U/L (ref 38–126)
Bilirubin, Direct: 0.2 mg/dL (ref 0.0–0.2)
Indirect Bilirubin: 0.3 mg/dL (ref 0.3–0.9)
Total Bilirubin: 0.5 mg/dL (ref 0.3–1.2)
Total Protein: 6.7 g/dL (ref 6.5–8.1)

## 2020-01-02 LAB — CBC
HCT: 39.2 % (ref 36.0–46.0)
Hemoglobin: 13.2 g/dL (ref 12.0–15.0)
MCH: 32.4 pg (ref 26.0–34.0)
MCHC: 33.7 g/dL (ref 30.0–36.0)
MCV: 96.3 fL (ref 80.0–100.0)
Platelets: 260 10*3/uL (ref 150–400)
RBC: 4.07 MIL/uL (ref 3.87–5.11)
RDW: 12.1 % (ref 11.5–15.5)
WBC: 9.3 10*3/uL (ref 4.0–10.5)
nRBC: 0 % (ref 0.0–0.2)

## 2020-01-02 LAB — URINALYSIS, ROUTINE W REFLEX MICROSCOPIC
Bacteria, UA: NONE SEEN
Bilirubin Urine: NEGATIVE
Glucose, UA: NEGATIVE mg/dL
Hgb urine dipstick: NEGATIVE
Ketones, ur: NEGATIVE mg/dL
Leukocytes,Ua: NEGATIVE
Nitrite: NEGATIVE
Protein, ur: 30 mg/dL — AB
Specific Gravity, Urine: 1.017 (ref 1.005–1.030)
pH: 5 (ref 5.0–8.0)

## 2020-01-02 LAB — RESPIRATORY PANEL BY RT PCR (FLU A&B, COVID)
Influenza A by PCR: NEGATIVE
Influenza B by PCR: NEGATIVE
SARS Coronavirus 2 by RT PCR: NEGATIVE

## 2020-01-02 LAB — LIPASE, BLOOD: Lipase: 31 U/L (ref 11–51)

## 2020-01-02 LAB — TROPONIN I (HIGH SENSITIVITY)
Troponin I (High Sensitivity): 8 ng/L (ref <18)
Troponin I (High Sensitivity): 11 ng/L (ref <18)

## 2020-01-02 LAB — BASIC METABOLIC PANEL
Anion gap: 10 (ref 5–15)
Anion gap: 13 (ref 5–15)
BUN: 17 mg/dL (ref 8–23)
BUN: 18 mg/dL (ref 8–23)
CO2: 24 mmol/L (ref 22–32)
CO2: 23 mmol/L (ref 22–32)
Calcium: 8.8 mg/dL — ABNORMAL LOW (ref 8.9–10.3)
Calcium: 8.9 mg/dL (ref 8.9–10.3)
Chloride: 98 mmol/L (ref 98–111)
Chloride: 99 mmol/L (ref 98–111)
Creatinine, Ser: 1.22 mg/dL — ABNORMAL HIGH (ref 0.44–1.00)
Creatinine, Ser: 1.17 mg/dL — ABNORMAL HIGH (ref 0.44–1.00)
GFR, Estimated: 40 mL/min — ABNORMAL LOW (ref >60)
GFR, Estimated: 42 mL/min — ABNORMAL LOW (ref >60)
Glucose, Bld: 183 mg/dL — ABNORMAL HIGH (ref 70–99)
Glucose, Bld: 154 mg/dL — ABNORMAL HIGH (ref 70–99)
Potassium: 5.7 mmol/L — ABNORMAL HIGH (ref 3.5–5.1)
Potassium: 3.8 mmol/L (ref 3.5–5.1)
Sodium: 132 mmol/L — ABNORMAL LOW (ref 135–145)
Sodium: 135 mmol/L (ref 135–145)

## 2020-01-02 LAB — LACTIC ACID, PLASMA: Lactic Acid, Venous: 1.2 mmol/L (ref 0.5–1.9)

## 2020-01-02 LAB — MAGNESIUM: Magnesium: 1.6 mg/dL — ABNORMAL LOW (ref 1.7–2.4)

## 2020-01-02 MED ORDER — SODIUM CHLORIDE 0.9 % IV SOLN
2.0000 g | Freq: Once | INTRAVENOUS | Status: AC
  Administered 2020-01-02: 2 g via INTRAVENOUS
  Filled 2020-01-02: qty 20

## 2020-01-02 MED ORDER — MORPHINE SULFATE (PF) 4 MG/ML IV SOLN
4.0000 mg | Freq: Once | INTRAVENOUS | Status: AC
  Administered 2020-01-02: 4 mg via INTRAVENOUS
  Filled 2020-01-02: qty 1

## 2020-01-02 MED ORDER — METOPROLOL TARTRATE 5 MG/5ML IV SOLN
2.5000 mg | Freq: Four times a day (QID) | INTRAVENOUS | Status: DC
  Administered 2020-01-02 – 2020-01-04 (×5): 2.5 mg via INTRAVENOUS
  Filled 2020-01-02 (×5): qty 5

## 2020-01-02 MED ORDER — ACETAMINOPHEN 650 MG RE SUPP: 650.0000 mg | Freq: Four times a day (QID) | RECTAL | Status: DC | PRN

## 2020-01-02 MED ORDER — ACETAMINOPHEN 325 MG PO TABS
650.0000 mg | ORAL_TABLET | Freq: Four times a day (QID) | ORAL | Status: DC | PRN
  Administered 2020-01-03 – 2020-01-04 (×2): 650 mg via ORAL
  Filled 2020-01-02 (×2): qty 2

## 2020-01-02 MED ORDER — ONDANSETRON HCL 4 MG/2ML IJ SOLN
4.0000 mg | Freq: Once | INTRAMUSCULAR | Status: AC
  Administered 2020-01-02: 4 mg via INTRAVENOUS
  Filled 2020-01-02: qty 2

## 2020-01-02 MED ORDER — HYDROMORPHONE HCL 1 MG/ML IJ SOLN
0.5000 mg | INTRAMUSCULAR | Status: DC | PRN
  Administered 2020-01-03: 0.5 mg via INTRAVENOUS
  Filled 2020-01-02: qty 1

## 2020-01-02 MED ORDER — HYDROMORPHONE HCL 1 MG/ML IJ SOLN
0.5000 mg | Freq: Once | INTRAMUSCULAR | Status: AC
  Administered 2020-01-02: 0.5 mg via INTRAVENOUS
  Filled 2020-01-02: qty 1

## 2020-01-02 MED ORDER — MAGNESIUM SULFATE 2 GM/50ML IV SOLN
2.0000 g | Freq: Once | INTRAVENOUS | Status: AC
  Administered 2020-01-02: 2 g via INTRAVENOUS
  Filled 2020-01-02: qty 50

## 2020-01-02 MED ORDER — IOHEXOL 350 MG/ML SOLN
50.0000 mL | Freq: Once | INTRAVENOUS | Status: AC | PRN
  Administered 2020-01-02: 50 mL via INTRAVENOUS

## 2020-01-02 MED ORDER — FENTANYL CITRATE (PF) 100 MCG/2ML IJ SOLN: 50.0000 ug | Freq: Once | INTRAMUSCULAR | Status: DC

## 2020-01-02 MED ORDER — PIPERACILLIN-TAZOBACTAM 3.375 G IVPB
3.3750 g | Freq: Three times a day (TID) | INTRAVENOUS | Status: DC
  Administered 2020-01-03 – 2020-01-07 (×14): 3.375 g via INTRAVENOUS
  Filled 2020-01-02 (×12): qty 50

## 2020-01-02 MED ORDER — PIPERACILLIN-TAZOBACTAM 3.375 G IVPB 30 MIN
3.3750 g | Freq: Once | INTRAVENOUS | Status: AC
  Administered 2020-01-03: 3.375 g via INTRAVENOUS
  Filled 2020-01-02: qty 50

## 2020-01-02 MED ORDER — ONDANSETRON HCL 4 MG/2ML IJ SOLN
4.0000 mg | Freq: Four times a day (QID) | INTRAMUSCULAR | Status: DC | PRN
  Administered 2020-01-03 – 2020-01-06 (×2): 4 mg via INTRAVENOUS
  Filled 2020-01-02 (×2): qty 2

## 2020-01-02 MED ORDER — METOCLOPRAMIDE HCL 5 MG/ML IJ SOLN
5.0000 mg | Freq: Once | INTRAMUSCULAR | Status: AC
  Administered 2020-01-02: 5 mg via INTRAVENOUS
  Filled 2020-01-02: qty 2

## 2020-01-02 MED ORDER — SODIUM CHLORIDE 0.9 % IV SOLN: INTRAVENOUS | Status: AC

## 2020-01-02 MED ORDER — HYDRALAZINE HCL 20 MG/ML IJ SOLN: 10.0000 mg | Freq: Four times a day (QID) | INTRAMUSCULAR | Status: DC | PRN

## 2020-01-02 MED ORDER — PANTOPRAZOLE SODIUM 40 MG IV SOLR
40.0000 mg | INTRAVENOUS | Status: DC
  Administered 2020-01-03 – 2020-01-04 (×2): 40 mg via INTRAVENOUS
  Filled 2020-01-02 (×3): qty 40

## 2020-01-02 MED ORDER — ONDANSETRON HCL 4 MG/2ML IJ SOLN
INTRAMUSCULAR | Status: AC
  Filled 2020-01-02: qty 2

## 2020-01-02 NOTE — ED Provider Notes (Signed)
[facility] Provider Note   CSN: [id number] Arrival date & time: 01/02/20  1252     History Chief Complaint  Patient presents with   Chest Pain    Brandy Hicks is a 86 y.o. female with history of essential tremors, hyperlipidemia, hypertension brought to the ED from [facility] for evaluation of epigastric abdominal pain that began at 4:30 AM waking her up from her sleep.  Initially was 10/10.  It radiated to bilateral posterior rib cage.  Had associated nausea and vomiting.  Patient took gabapentin, a "pain pill" and Tylenol at around 930 and symptoms seemed to improve so she waited to come to the ED.  Pain continued and so she called 911.  No aggravating or alleviating factors at this time.  States yesterday she felt lightheaded all day but had no pain.  Has been feeling well otherwise without any recent upper respiratory illness or gastrointestinal illness.  No recent dysuria.  Fully vaccinated for COVID.  No cough.  No peripheral leg swelling, calf pain, hemoptysis, history of blood clots in the past.  No pain with inspiration.  Denies extremity pain, paresthesias or numbness.  Still has a gallbladder. Given 324 ASA by EMS en route. No known CAD. Remote history of tobacco use but quit at age 86. Per EMT here patient found to be 86% on RA, placed on 3 L [place] and improved.   HPI     Past Medical History:  Diagnosis Date   Depression    Guillain Barr syndrome (HCC)    Hormone replacement therapy    Hyperlipidemia    Hypertension    Hypothyroid    Osteoporosis    Rosacea    TIA (transient ischemic attack)    Tremor     Patient Active Problem List   Diagnosis Date Noted   Fecal impaction (HCC) 04/19/2019   Acute urinary retention 04/19/2019   Leukocytosis 04/19/2019   Urinary retention 04/19/2019   Hyponatremia 04/19/2019   Hypokalemia 04/19/2019   Voice tremor 10/13/2017   GBS (Guillain-Barre  syndrome) (HCC) 07/31/2015   History of Guillain-Barre syndrome 05/02/2015   Anxiety state 05/02/2015   Panic attack 05/02/2015   Essential tremor 05/02/2015   Altered mental status    UTI (urinary tract infection) 04/22/2015   Acute kidney injury (HCC) 04/22/2015   Essential hypertension, benign 11/28/2013   Depression 04/09/2011   Insomnia 04/09/2011    Past Surgical History:  Procedure Laterality Date   FACIAL COSMETIC SURGERY     HEEL SPUR SURGERY     TONSILLECTOMY       OB History   No obstetric history on file.     Family History  Problem Relation Age of Onset   Stroke Mother    Hypertension Father    CAD Father    Prostate cancer Father     Social History   Tobacco Use   Smoking status: Former Smoker   Smokeless tobacco: Never Used  Substance Use Topics   Alcohol use: No   Drug use: No    Home Medications Prior to Admission medications   Medication Sig Start Date End Date Taking? Authorizing Provider  amLODipine (NORVASC) 10 MG tablet Take 10 mg by mouth daily.    [provider]  aspirin 81 MG tablet Take 81 mg by mouth daily.    [provider]  atorvastatin (LIPITOR) 40 MG tablet Take 40 mg by mouth daily.    [provider]  calcium-vitamin D (OSCAL WITH D) 500-200 MG-UNIT tablet Take 1 tablet by mouth daily with breakfast.    [provider]  chlorthalidone (HYGROTON) 25 MG tablet Take 1 tablet (25 mg total) by mouth daily. 04/20/19   Jae Dire, MD  levothyroxine (SYNTHROID) 137 MCG tablet Take 137 mcg by mouth daily. 02/04/19   [provider]  losartan (COZAAR) 100 MG tablet Take 100 mg by mouth daily.    [provider]  Melatonin 3 MG TABS Take 3 mg by mouth at bedtime.    [provider]  Multiple Vitamins-Minerals (MULTIVITAMIN ADULT PO) Take 1 tablet by mouth daily.    [provider]  primidone (MYSOLINE) 50 MG tablet Take 2 tablets (100 mg total)  by mouth 2 (two) times daily. 04/20/19   Jae Dire, MD  propranolol ER (INDERAL LA) 60 MG 24 hr capsule TAKE ONE CAPSULE BY MOUTH EVERY NIGHT AT BEDTIME Patient taking differently: Take 60 mg by mouth daily.  07/24/16   Anson Fret, MD  sertraline (ZOLOFT) 50 MG tablet Take 50 mg by mouth daily.  05/21/15   [provider]    Allergies    Hydroquinone  Review of Systems   Review of Systems  Gastrointestinal: Positive for abdominal pain, nausea and vomiting.  Musculoskeletal: Positive for back pain.  Neurological: Positive for light-headedness.  All other systems reviewed and are negative.   Physical Exam Updated Vital Signs BP (!) 184/47    Pulse (!) 57    Temp 98.3 F (36.8 C) (Oral)    Resp 18    SpO2 97%   Physical Exam Vitals and nursing note reviewed.  Constitutional:      Appearance: She is well-developed.     Comments: Non toxic in NAD  HENT:     Head: Normocephalic and atraumatic.     Nose: Nose normal.  Eyes:     Conjunctiva/sclera: Conjunctivae normal.  Cardiovascular:     Rate and Rhythm: Normal rate and regular rhythm.     Pulses:          Radial pulses are 1+ on the right side and 1+ on the left side.       Dorsalis pedis pulses are 1+ on the right side and 2+ on the left side.     Comments: No LE edema. No calf tenderness. No murmur  Pulmonary:     Effort: Pulmonary effort is normal.     Breath sounds: Normal breath sounds.  Abdominal:     General: Bowel sounds are normal.     Palpations: Abdomen is soft.     Tenderness: There is abdominal tenderness (mild, with deep palpation epigastric/RUQ).     Comments: No G/R/R. No suprapubic or CVA tenderness. Negative Murphy's and McBurney's. Active BS to lower quadrants. No pulsatility. No obvious distention.   Musculoskeletal:        General: Normal range of motion.     Cervical back: Normal range of motion.  Skin:    General: Skin is warm and dry.     Capillary Refill: Capillary refill takes  less than 2 seconds.  Neurological:     Mental Status: She is alert.     Comments: Sensation and strength intact in upper/lower extremities   Psychiatric:        Behavior: Behavior normal.     ED Results / Procedures / Treatments   Labs (all labs ordered are listed, but only abnormal results are displayed) Labs  Reviewed  BASIC METABOLIC PANEL - Abnormal; Notable for the following components:      Result Value   Sodium 132 (*)    Potassium 5.7 (*)    Glucose, Bld 183 (*)    Creatinine, Ser 1.22 (*)    Calcium 8.8 (*)    GFR, Estimated 40 (*)    All other components within normal limits  CBC  URINALYSIS, ROUTINE W REFLEX MICROSCOPIC  HEPATIC FUNCTION PANEL  LIPASE, BLOOD  MAGNESIUM  BASIC METABOLIC PANEL  TROPONIN I (HIGH SENSITIVITY)  TROPONIN I (HIGH SENSITIVITY)    EKG EKG Interpretation  Date/Time:  Monday January 02 2020 13:50:00 EDT Ventricular Rate:  54 PR Interval:  184 QRS Duration: 81 QT Interval:  461 QTC Calculation: 437 R Axis:   47 Text Interpretation: Sinus rhythm Abnormal R-wave progression, early transition similar to prior today Confirmed by Meridee Score [phone]) on 01/02/2020 1:52:29 PM   Radiology DG Chest 2 View  Result Date: 01/02/2020 CLINICAL DATA:  Chest pain. EXAM: CHEST - 2 VIEW COMPARISON:  April 22, 2015. FINDINGS: The heart size and mediastinal contours are within normal limits. Both lungs are clear. No pneumothorax or pleural effusion is noted. The visualized skeletal structures are unremarkable. IMPRESSION: No active cardiopulmonary disease. Aortic Atherosclerosis (ICD10-I70.0). Electronically Signed   By: Lupita Raider M.D.   On: 01/02/2020 13:32    Procedures Procedures (including critical care time)  Medications Ordered in ED Medications  morphine 4 MG/ML injection 4 mg (has no administration in time range)  ondansetron (ZOFRAN) injection 4 mg (4 mg Intravenous Given 01/02/20 1304)    ED Course  I have reviewed the  triage vital signs and the nursing notes.  Pertinent labs & imaging results that were available during my care of the patient were reviewed by me and considered in my medical decision making (see chart for details).  Clinical Course as of Jan 02 1540  Mon Jan 02, 2020  1401 Creatinine(!): 1.22 [CG]  1401 GFR, Estimated(!): 40 [CG]  1401 Potassium(!): 5.7 [CG]  1401 Sodium(!): 132 [CG]  1738 86 year old female complaining of subxiphoid abdominal pain radiating through to her back that woke her up this morning around 1 AM.  Was severe initially has improved over time.  Denies prior history of same.  Abdomen is soft without any masses.  Getting labs and imaging EKG.   [MB]    Clinical Course User Index [CG] Liberty Handy, PA-C [MB] Terrilee Files, MD   MDM Rules/Calculators/A&P                          86 year old female presents for acute, 10 out of 10 epigastric abdominal pain that radiated to the back at 4:30 AM this morning associated with nausea and vomiting.  Lightheaded yesterday.  No known CAD.  No known pulmonary disease.  Has gallbladder.  Per EMT here hypoxic 86% on room air, placed on 3 L [place].  I discontinued supplemental oxygen and SPO2 has remained greater than 92% here.  Patient has no shortness of breath.  Arrives slightly bradycardic in the high 50s, hypertensive.  ?decreased right DP pulse.  Bedside ultrasound performed by me shows gallstones but no GB wall thickening, pericholecystic fluid.  Unable to visualize duct.  No obvious dissection on ultrasound, limited by bowel gas/pain. Will plan for formal imaging.   EMR, triage nursing notes reviewed to assist with history and MDM.  ER work-up initiated by triage RN  including CBC, BMP, troponin, EKG, chest x-ray and Zofran.  After my evaluation I added LFTs, lipase, urinalysis, magnesium.  I have ordered continuous cardiac monitoring, pulse oximeter.  I have ordered medicines including morphine for pain.  DDx  includes biliary colic, atypical presentation of ACS/non-STEMI, dissection is a possibility given location of pain and radiation.  Hypoxic on arrival but not anymore and she denies fever, cough, shortness of breath.  No signs of hypervolemia no history of CHF.  PE less likely.  1538: ER work-up thus far personally visualized and interpreted.  EKG reveals sinus bradycardia heart rate 54. Troponin 8.  Chest x-ray without acute pulmonary findings, pulmonary edema, infiltrates, widening of mediastinum, free air.  CBC without leukocytosis.  Hemoglobin normal.  Patient's pain has improved here but is still persistent.  Patient will be handed off to oncoming ED PA who will follow up on remaining labs.  Recommend CT dissection study if unrevealing lab work and continued pain.  Shared with EDP Charm Barges. Final Clinical Impression(s) / ED Diagnoses Final diagnoses:  Epigastric abdominal pain    Rx / DC Orders ED Discharge Orders    None       Liberty Handy, PA-C 01/02/20 1541    Terrilee Files, MD 01/02/20 1911

## 2020-01-02 NOTE — ED Provider Notes (Signed)
15:30: Assumed care of patient from Brandy Heck PA-C at change of shift pending remaining labs, CTA, & disposition.   Please see prior provider note for full H&P.  Briefly patient is an 86 yo female with a history of hypertension, hyperlipidemia, prior TIA, and GBS who presented to the ED with complaints of epigastric pain radiating bilaterally that woke her from sleep. Lightheaded yesterday, but no pain @ that time.   Given analgesics with some improvement in pain.  Initially 86% and was placed on 3L by nursing staff, taken off and has done well on RA without desaturation.   Physical Exam  BP (!) 184/47   Pulse (!) 57   Temp 98.3 F (36.8 C) (Oral)   Resp 18   SpO2 97%   Physical Exam Vitals and nursing note reviewed.  Constitutional:      General: She is in acute distress (mild appears uncomfortable).  HENT:     Head: Normocephalic and atraumatic.  Abdominal:     Tenderness: There is abdominal tenderness in the right upper quadrant and epigastric area.  Skin:    General: Skin is warm and dry.  Neurological:     Mental Status: She is alert.  Psychiatric:        Mood and Affect: Mood normal.        Behavior: Behavior normal.     ED Course/Procedures   Results for orders placed or performed during the hospital encounter of 01/02/20  Basic metabolic panel  Result Value Ref Range   Sodium 132 (L) 135 - 145 mmol/L   Potassium 5.7 (H) 3.5 - 5.1 mmol/L   Chloride 98 98 - 111 mmol/L   CO2 24 22 - 32 mmol/L   Glucose, Bld 183 (H) 70 - 99 mg/dL   BUN 17 8 - 23 mg/dL   Creatinine, Ser 5.46 (H) 0.44 - 1.00 mg/dL   Calcium 8.8 (L) 8.9 - 10.3 mg/dL   GFR, Estimated 40 (L) >60 mL/min   Anion gap 10 5 - 15  CBC  Result Value Ref Range   WBC 9.3 4.0 - 10.5 K/uL   RBC 4.07 3.87 - 5.11 MIL/uL   Hemoglobin 13.2 12.0 - 15.0 g/dL   HCT 27.0 36 - 46 %   MCV 96.3 80.0 - 100.0 fL   MCH 32.4 26.0 - 34.0 pg   MCHC 33.7 30.0 - 36.0 g/dL   RDW 35.0 09.3 - 81.8 %   Platelets 260 150  - 400 K/uL   nRBC 0.0 0.0 - 0.2 %  Urinalysis, Routine w reflex microscopic  Result Value Ref Range   Color, Urine YELLOW YELLOW   APPearance CLEAR CLEAR   Specific Gravity, Urine 1.017 1.005 - 1.030   pH 5.0 5.0 - 8.0   Glucose, UA NEGATIVE NEGATIVE mg/dL   Hgb urine dipstick NEGATIVE NEGATIVE   Bilirubin Urine NEGATIVE NEGATIVE   Ketones, ur NEGATIVE NEGATIVE mg/dL   Protein, ur 30 (A) NEGATIVE mg/dL   Nitrite NEGATIVE NEGATIVE   Leukocytes,Ua NEGATIVE NEGATIVE   RBC / HPF 0-5 0 - 5 RBC/hpf   WBC, UA 0-5 0 - 5 WBC/hpf   Bacteria, UA NONE SEEN NONE SEEN   Squamous Epithelial / LPF 0-5 0 - 5   Mucus PRESENT   Hepatic function panel  Result Value Ref Range   Total Protein 6.7 6.5 - 8.1 g/dL   Albumin 3.6 3.5 - 5.0 g/dL   AST 32 15 - 41 U/L   ALT 24 0 -  44 U/L   Alkaline Phosphatase 73 38 - 126 U/L   Total Bilirubin 0.5 0.3 - 1.2 mg/dL   Bilirubin, Direct 0.2 0.0 - 0.2 mg/dL   Indirect Bilirubin 0.3 0.3 - 0.9 mg/dL  Lipase, blood  Result Value Ref Range   Lipase 31 11 - 51 U/L  Magnesium  Result Value Ref Range   Magnesium 1.6 (L) 1.7 - 2.4 mg/dL  Basic metabolic panel  Result Value Ref Range   Sodium 135 135 - 145 mmol/L   Potassium 3.8 3.5 - 5.1 mmol/L   Chloride 99 98 - 111 mmol/L   CO2 23 22 - 32 mmol/L   Glucose, Bld 154 (H) 70 - 99 mg/dL   BUN 18 8 - 23 mg/dL   Creatinine, Ser 1.27 (H) 0.44 - 1.00 mg/dL   Calcium 8.9 8.9 - 51.7 mg/dL   GFR, Estimated 42 (L) >60 mL/min   Anion gap 13 5 - 15  Troponin I (High Sensitivity)  Result Value Ref Range   Troponin I (High Sensitivity) 8 <18 ng/L  Troponin I (High Sensitivity)  Result Value Ref Range   Troponin I (High Sensitivity) 11 <18 ng/L   DG Chest 2 View  Result Date: 01/02/2020 CLINICAL DATA:  Chest pain. EXAM: CHEST - 2 VIEW COMPARISON:  April 22, 2015. FINDINGS: The heart size and mediastinal contours are within normal limits. Both lungs are clear. No pneumothorax or pleural effusion is noted. The  visualized skeletal structures are unremarkable. IMPRESSION: No active cardiopulmonary disease. Aortic Atherosclerosis (ICD10-I70.0). Electronically Signed   By: Lupita Raider M.D.   On: 01/02/2020 13:32   CT Angio Chest/Abd/Pel for Dissection W and/or W/WO  Result Date: 01/02/2020 CLINICAL DATA:  Chest pain, abdominal pain, aortic dissection EXAM: CT ANGIOGRAPHY CHEST, ABDOMEN AND PELVIS TECHNIQUE: Non-contrast CT of the chest was initially obtained. Multidetector CT imaging through the chest, abdomen and pelvis was performed using the standard protocol during bolus administration of intravenous contrast. Multiplanar reconstructed images and MIPs were obtained and reviewed to evaluate the vascular anatomy. CONTRAST:  49mL OMNIPAQUE IOHEXOL 350 MG/ML SOLN COMPARISON:  None. FINDINGS: CTA CHEST FINDINGS Cardiovascular: The thoracic aorta is normal in caliber. No evidence of intramural hematoma, dissection, or aneurysm. There is extensive atherosclerotic calcification identified within the aortic arch and proximal arch vasculature resulting in a less than 50% stenosis of the left common carotid artery at its origin. Bovine arch anatomy noted. Pulmonary arterial caliber is within normal limits. Global cardiac size is within normal limits. Moderate atherosclerotic calcification of the coronary arteries. Moderate calcification of the mitral valve annulus. The aortic valve is trileaflet. Mediastinum/Nodes: There is shotty mediastinal adenopathy present without frankly pathologic enlargement noted. Thyroid unremarkable. Esophagus unremarkable. Lungs/Pleura: Scattered nodular infiltrates within the right upper lobe are nonspecific and may be infectious or inflammatory in nature. Scarring noted within the right lung base. No pneumothorax or pleural effusion. Central airways are widely patent. Musculoskeletal: No acute bone abnormality. Review of the MIP images confirms the above findings. CTA ABDOMEN AND PELVIS  FINDINGS VASCULAR Aorta: The abdominal aorta is normal in caliber; no evidence of aneurysm or dissection. There is extensive atherosclerotic calcification noted within the abdominal aorta. No evidence of hemodynamically significant stenosis. Celiac: Widely patent.  Conventional anatomy.  No aneurysm. SMA: Widely patent.  Conventional anatomy. Renals: Dual right and single left renal arteries are widely patent. Normal arterial contour. No aneurysm. IMA: Widely patent. Inflow: Widely patent. Internal iliac arteries are patent bilaterally. Veins: Not well opacified.  Review of the MIP images confirms the above findings. NON-VASCULAR Hepatobiliary: The gallbladder is distended. A calcified 6 mm gallstone is seen impacted within the gallbladder neck. The gallbladder wall appears mildly edematous and, together, the findings are suggestive of early changes of acute cholecystitis. No significant internal or external biliary ductal dilation. Pancreas: Unremarkable Spleen: Unremarkable Adrenals/Urinary Tract: The adrenal glands are unremarkable. Simple cortical cysts are seen bilaterally. Mild renal cortical atrophy. The kidneys are otherwise unremarkable. Bladder is unremarkable. Stomach/Bowel: The stomach, small bowel, and large bowel are unremarkable. The appendix is normal. Tiny fat containing umbilical hernia. No free intraperitoneal gas. Tiny bilateral fat containing inguinal hernias. Lymphatic: No pathologic adenopathy within the abdomen and pelvis. Reproductive: Uterus and bilateral adnexa are unremarkable. Other: Rectum unremarkable Musculoskeletal: No acute bone abnormality. Advanced degenerative changes of the lumbosacral junction. Review of the MIP images confirms the above findings. IMPRESSION: No evidence of aortic aneurysm or dissection. Impacted 6 mm gallstone within the a gallbladder neck with gallbladder distension and edematous change suggestive of early acute calculus cholecystitis. Correlation with liver  enzymes and physical examination is recommended. Scattered nodular infiltrates within the right upper lobe, nonspecific, possibly infectious or inflammatory in nature. Aortic Atherosclerosis (ICD10-I70.0). Electronically Signed   By: Helyn Numbers MD   On: 01/02/2020 17:33    Clinical Course as of Jan 01 1510  Mon Jan 02, 2020  1401 Creatinine(!): 1.22 [CG]  1401 GFR, Estimated(!): 40 [CG]  1401 Potassium(!): 5.7 [CG]  1401 Sodium(!): 132 [CG]  3858 86 year old female complaining of subxiphoid abdominal pain radiating through to her back that woke her up this morning around 1 AM.  Was severe initially has improved over time.  Denies prior history of same.  Abdomen is soft without any masses.  Getting labs and imaging EKG.   [MB]    Clinical Course User Index [CG] Brandy Handy, PA-C [MB] Brandy Files, MD    Procedures  MDM   Plan @ change of shift is for CTA dissection study & remaining labs.  All labs and imaging reviewed and interpreted. CBC: Unremarkable Repeat BMP with mildly worsening renal function, however potassium is within normal limits without hemolysis. Lipase: Within normal limits Hepatic function panel: Within normal limits Troponin: Not significantly elevated Magnesium: Mildly low at 1.6. Urinalysis: No UTI.  CT angio: No evidence of aortic aneurysm or dissection. Impacted 6 mm gallstone within the a gallbladder neck with gallbladder distension and edematous change suggestive of early acute calculus cholecystitis. Correlation with liver enzymes and physical examination is recommended. Scattered nodular infiltrates within the right upper lobe, nonspecific, possibly infectious or inflammatory in nature. Aortic Atherosclerosis   CT scan with findings of impacted 6 mm gallstone within the gallbladder neck with gallbladder distention and edematous changes suggestive of early acute calculus cholecystitis.  LFTs and lipase are within normal limits.  On my physical  exam patient has epigastric & RUQ tenderness to palpation leading to clinical concern for calculus cholecystitis. She remains nauseated & uncomfortable on reassessment and has had some vomiting. Reglan & dilaudid ordered. Start abx. Will discuss w/ general surgery.   Discussed with general surgeon Dr. Dwain Sarna- recommends RUQ [place] and hospitalist admission, will see patient, no OR tonight.   Discussed with hospitalist Dr. Leafy Half who accepts admission.     Brandy Hicks 01/02/20 2009    Alvira Monday, MD 01/03/20 [phone]

## 2020-01-02 NOTE — H&P (Signed)
History and Physical    Brandy FuchsBeverly L Van Der Hicks WUJ:[id number] DOB: 12/09/1932 DOA: 01/02/2020  PCP: Dois Davenportichter, Karen L, MD  Patient coming from: [facility]   Chief Complaint:  Chief Complaint  Patient presents with  . Chest Pain     HPI:    86 year old female with past medical history of hyperlipidemia, hypertension, hypothyroidism, Guillain Barre Syndrome who presents to [facility] emergency department with complaints of chest pain nausea vomiting.  Patient explains that she woke up at approximately 430 this morning with sudden onset of epigastric and chest discomfort.  Patient describes discomfort as sharp in quality, severe intensity and radiating around to her back.  At approximately same time the patient began to develop intense nausea with frequent bouts of nonbilious nonbloody vomiting.  Patient symptoms persisted throughout the morning at approximately 9:30 AM the patient attempted to take something by mouth but this resulted in more vomiting.  Patient denies sick contacts, recent travel, fevers, dysuria, low back pain or diarrhea.  Patient denies cough or shortness of breath.  Due to progressively worsening symptoms the patient eventually presented to [facility] department for [facility].  Upon evaluation in the emergency department patient was initially evaluated for possible ACS.  Serial cardiac enzymes were unremarkable without dynamic EKG changes noted on EKG.  Patient underwent CT angiogram of the chest which revealed no evidence of pulmonary embolism but did incidentally reveal impacted 6 mm stone in the neck of the gallbladder with gallbladder distention and edema.  Case was discussed with Dr. Dwain SarnaWakefield with general surgery who stated that he would be happy to consult with medicine to admit.  2 g of intravenous ceftriaxone were administered.  Patient was also given.  Zofran, morphine and Dilaudid for associated symptoms.  The hospitalist  group was then called to assess the patient for admission to the hospital.  Review of Systems:   Review of Systems  Gastrointestinal: Positive for abdominal pain, nausea and vomiting.  All other systems reviewed and are negative.   Past Medical History:  Diagnosis Date  . Depression   . Guillain Barr syndrome (HCC)   . Hormone replacement therapy   . Hyperlipidemia   . Hypertension   . Hypothyroid   . Osteoporosis   . Rosacea   . TIA (transient ischemic attack)   . Tremor     Past Surgical History:  Procedure Laterality Date  . FACIAL COSMETIC SURGERY    . HEEL SPUR SURGERY    . TONSILLECTOMY       reports that she has quit smoking. She has never used smokeless tobacco. She reports that she does not drink alcohol and does not use drugs.  Allergies  Allergen Reactions  . Hydroquinone     Pinkness and edema of face and eyelids, severe    Family History  Problem Relation Age of Onset  . Stroke Mother   . Hypertension Father   . CAD Father   . Prostate cancer Father      Prior to Admission medications   Medication Sig Start Date End Date Taking? Authorizing Provider  amLODipine (NORVASC) 10 MG tablet Take 10 mg by mouth daily.    [provider]  aspirin 81 MG tablet Take 81 mg by mouth daily.    [provider]  atorvastatin (LIPITOR) 40 MG tablet Take 40 mg by mouth daily.    [provider]  calcium-vitamin D (OSCAL WITH D) 500-200 MG-UNIT tablet Take 1 tablet by mouth  daily with breakfast.    [provider]  chlorthalidone (HYGROTON) 25 MG tablet Take 1 tablet (25 mg total) by mouth daily. 04/20/19   Jae Dire, MD  levothyroxine (SYNTHROID) 137 MCG tablet Take 137 mcg by mouth daily. 02/04/19   [provider]  losartan (COZAAR) 100 MG tablet Take 100 mg by mouth daily.    [provider]  Melatonin 3 MG TABS Take 3 mg by mouth at bedtime.    [provider]  Multiple Vitamins-Minerals  (MULTIVITAMIN ADULT PO) Take 1 tablet by mouth daily.    [provider]  primidone (MYSOLINE) 50 MG tablet Take 2 tablets (100 mg total) by mouth 2 (two) times daily. 04/20/19   Jae Dire, MD  propranolol ER (INDERAL LA) 60 MG 24 hr capsule TAKE ONE CAPSULE BY MOUTH EVERY NIGHT AT BEDTIME Patient taking differently: Take 60 mg by mouth daily.  07/24/16   Anson Fret, MD  sertraline (ZOLOFT) 50 MG tablet Take 50 mg by mouth daily.  05/21/15   [provider]    Physical Exam: Vitals:   01/02/20 1300 01/02/20 1900  BP: (!) 184/47 (!) 186/63  Pulse: (!) 57 62  Resp: 18   Temp: 98.3 F (36.8 C)   TempSrc: Oral   SpO2: 97% 98%    Constitutional: Acute alert and oriented x3, patient is in distress due to abdominal pain. Skin: no rashes, no lesions, notable poor skin turgor. Eyes: Pupils are equally reactive to light.  No evidence of scleral icterus or conjunctival pallor.  ENMT: Dry mucous membranes noted.  Posterior pharynx clear of any exudate or lesions.   Neck: normal, supple, no masses, no thyromegaly.  No evidence of jugular venous distension.   Respiratory: clear to auscultation bilaterally, no wheezing, no crackles. Normal respiratory effort. No accessory muscle use.  Cardiovascular: Regular rate and rhythm, no murmurs / rubs / gallops. No extremity edema. 2+ pedal pulses. No carotid bruits.  Chest:   Nontender without crepitus or deformity.   Back:   Nontender without crepitus or deformity. Abdomen: Epigastric abdominal tenderness noted.  Abdomen soft however.  No evidence of intra-abdominal masses.  Positive bowel sounds noted in all quadrants.   Musculoskeletal: No joint deformity upper and lower extremities. Good ROM, no contractures. Normal muscle tone.  Neurologic: CN 2-12 grossly intact. Sensation intact.  Patient moving all 4 extremities spontaneously.  Patient is following all commands.  Patient is responsive to verbal stimuli.   Psychiatric: Patient  exhibits normal mood with appropriate affect.  Patient seems to possess insight as to their current situation.     Labs on Admission: I have personally reviewed following labs and imaging studies -   CBC: Recent Labs  Lab 01/02/20 1259  WBC 9.3  HGB 13.2  HCT 39.2  MCV 96.3  PLT 260   Basic Metabolic Panel: Recent Labs  Lab 01/02/20 1259 01/02/20 1519  NA 132* 135  K 5.7* 3.8  CL 98 99  CO2 24 23  GLUCOSE 183* 154*  BUN 17 18  CREATININE 1.22* 1.17*  CALCIUM 8.8* 8.9  MG  --  1.6*   GFR: CrCl cannot be calculated (Unknown ideal weight.). Liver Function Tests: Recent Labs  Lab 01/02/20 1519  AST 32  ALT 24  ALKPHOS 73  BILITOT 0.5  PROT 6.7  ALBUMIN 3.6   Recent Labs  Lab 01/02/20 1519  LIPASE 31   No results for input(s): AMMONIA in the last 168 hours. Coagulation  Profile: No results for input(s): INR, PROTIME in the last 168 hours. Cardiac Enzymes: No results for input(s): CKTOTAL, CKMB, CKMBINDEX, TROPONINI in the last 168 hours. BNP (last 3 results) No results for input(s): PROBNP in the last 8760 hours. HbA1C: No results for input(s): HGBA1C in the last 72 hours. CBG: No results for input(s): GLUCAP in the last 168 hours. Lipid Profile: No results for input(s): CHOL, HDL, LDLCALC, TRIG, CHOLHDL, LDLDIRECT in the last 72 hours. Thyroid Function Tests: No results for input(s): TSH, T4TOTAL, FREET4, T3FREE, THYROIDAB in the last 72 hours. Anemia Panel: No results for input(s): VITAMINB12, FOLATE, FERRITIN, TIBC, IRON, RETICCTPCT in the last 72 hours. Urine analysis:    Component Value Date/Time   COLORURINE YELLOW 01/02/2020 1637   APPEARANCEUR CLEAR 01/02/2020 1637   LABSPEC 1.017 01/02/2020 1637   PHURINE 5.0 01/02/2020 1637   GLUCOSEU NEGATIVE 01/02/2020 1637   HGBUR NEGATIVE 01/02/2020 1637   BILIRUBINUR NEGATIVE 01/02/2020 1637   KETONESUR NEGATIVE 01/02/2020 1637   PROTEINUR 30 (A) 01/02/2020 1637   NITRITE NEGATIVE 01/02/2020 1637     LEUKOCYTESUR NEGATIVE 01/02/2020 1637    Radiological Exams on Admission - Personally Reviewed: DG Chest 2 View  Result Date: 01/02/2020 CLINICAL DATA:  Chest pain. EXAM: CHEST - 2 VIEW COMPARISON:  April 22, 2015. FINDINGS: The heart size and mediastinal contours are within normal limits. Both lungs are clear. No pneumothorax or pleural effusion is noted. The visualized skeletal structures are unremarkable. IMPRESSION: No active cardiopulmonary disease. Aortic Atherosclerosis (ICD10-I70.0). Electronically Signed   By: Lupita Raider M.D.   On: 01/02/2020 13:32   CT Angio Chest/Abd/Pel for Dissection W and/or W/WO  Result Date: 01/02/2020 CLINICAL DATA:  Chest pain, abdominal pain, aortic dissection EXAM: CT ANGIOGRAPHY CHEST, ABDOMEN AND PELVIS TECHNIQUE: Non-contrast CT of the chest was initially obtained. Multidetector CT imaging through the chest, abdomen and pelvis was performed using the standard protocol during bolus administration of intravenous contrast. Multiplanar reconstructed images and MIPs were obtained and reviewed to evaluate the vascular anatomy. CONTRAST:  71mL OMNIPAQUE IOHEXOL 350 MG/ML SOLN COMPARISON:  None. FINDINGS: CTA CHEST FINDINGS Cardiovascular: The thoracic aorta is normal in caliber. No evidence of intramural hematoma, dissection, or aneurysm. There is extensive atherosclerotic calcification identified within the aortic arch and proximal arch vasculature resulting in a less than 50% stenosis of the left common carotid artery at its origin. Bovine arch anatomy noted. Pulmonary arterial caliber is within normal limits. Global cardiac size is within normal limits. Moderate atherosclerotic calcification of the coronary arteries. Moderate calcification of the mitral valve annulus. The aortic valve is trileaflet. Mediastinum/Nodes: There is shotty mediastinal adenopathy present without frankly pathologic enlargement noted. Thyroid unremarkable. Esophagus unremarkable.  Lungs/Pleura: Scattered nodular infiltrates within the right upper lobe are nonspecific and may be infectious or inflammatory in nature. Scarring noted within the right lung base. No pneumothorax or pleural effusion. Central airways are widely patent. Musculoskeletal: No acute bone abnormality. Review of the MIP images confirms the above findings. CTA ABDOMEN AND PELVIS FINDINGS VASCULAR Aorta: The abdominal aorta is normal in caliber; no evidence of aneurysm or dissection. There is extensive atherosclerotic calcification noted within the abdominal aorta. No evidence of hemodynamically significant stenosis. Celiac: Widely patent.  Conventional anatomy.  No aneurysm. SMA: Widely patent.  Conventional anatomy. Renals: Dual right and single left renal arteries are widely patent. Normal arterial contour. No aneurysm. IMA: Widely patent. Inflow: Widely patent. Internal iliac arteries are patent bilaterally. Veins: Not well opacified. Review of the  MIP images confirms the above findings. NON-VASCULAR Hepatobiliary: The gallbladder is distended. A calcified 6 mm gallstone is seen impacted within the gallbladder neck. The gallbladder wall appears mildly edematous and, together, the findings are suggestive of early changes of acute cholecystitis. No significant internal or external biliary ductal dilation. Pancreas: Unremarkable Spleen: Unremarkable Adrenals/Urinary Tract: The adrenal glands are unremarkable. Simple cortical cysts are seen bilaterally. Mild renal cortical atrophy. The kidneys are otherwise unremarkable. Bladder is unremarkable. Stomach/Bowel: The stomach, small bowel, and large bowel are unremarkable. The appendix is normal. Tiny fat containing umbilical hernia. No free intraperitoneal gas. Tiny bilateral fat containing inguinal hernias. Lymphatic: No pathologic adenopathy within the abdomen and pelvis. Reproductive: Uterus and bilateral adnexa are unremarkable. Other: Rectum unremarkable Musculoskeletal: No  acute bone abnormality. Advanced degenerative changes of the lumbosacral junction. Review of the MIP images confirms the above findings. IMPRESSION: No evidence of aortic aneurysm or dissection. Impacted 6 mm gallstone within the a gallbladder neck with gallbladder distension and edematous change suggestive of early acute calculus cholecystitis. Correlation with liver enzymes and physical examination is recommended. Scattered nodular infiltrates within the right upper lobe, nonspecific, possibly infectious or inflammatory in nature. Aortic Atherosclerosis (ICD10-I70.0). Electronically Signed   By: Helyn Numbers MD   On: 01/02/2020 17:33   US Abdomen Limited RUQ (LIVER/GB)  Result Date: 01/02/2020 CLINICAL DATA:  Abdominal pain.  Abnormal CT. EXAM: ULTRASOUND ABDOMEN LIMITED RIGHT UPPER QUADRANT COMPARISON:  CTA chest earlier today.  CT 04/19/2019 FINDINGS: Gallbladder: 8 mm gallstone within the gallbladder neck, non mobile. Sludge within the gallbladder. Gallbladder wall thickening measuring 4 mm. Common bile duct: Diameter: Normal caliber, 6-7 mm. Liver: No focal lesion identified. Within normal limits in parenchymal echogenicity. Portal vein is patent on color Doppler imaging with normal direction of blood flow towards the liver. Other: None. IMPRESSION: 8 mm gallstone lodged within the gallbladder neck. Layering sludge within the gallbladder and gallbladder wall thickening. Appearance is concerning for possible acute cholecystitis. Electronically Signed   By: Charlett Nose M.D.   On: 01/02/2020 20:09    EKG: Personally reviewed.  Rhythm is sinus bradycardia with heart rate of 54 bpm.  No dynamic ST segment changes appreciated.  Assessment/Plan Principal Problem:   Acute cholecystitis   Patient presenting with what initially was severe epigastric and chest discomfort however upon thorough evaluation in the emergency department it turns out the patient has an impacted stone in the gallbladder neck  with radiographic findings of acute cholecystitis.  Ultrasound images confirmed finding of acute cholecystitis  Case discussed with Dr. Dwain Sarna with general surgery who is asking for patient be n.p.o. after midnight with likely operative intervention tomorrow.  Patient has been placed on clear liquids for now, n.p.o. after midnight  Hydrating patient with intravenous isotonic fluids  Patient was given a dose of intravenous ceftriaxone by the emergency department staff.  We will transition to intravenous Zosyn for now for additional anaerobic coverage.  Additionally providing patient as needed antiemetics and analgesics for associated symptoms  Active Problems:   Chest pain   EKG unremarkable, several sets of cardiac enzymes unremarkable.  Likely GI related secondary to frequent bouts of vomiting this morning   Placing patient on IV PPI, as needed antiemetics    Hypomagnesemia   Replacing with intravenous magnesium sulfate  Monitoring magnesium levels with serial chemistries    Hyperkalemia   Patient presenting with mild hyperkalemia without EKG changes  Hyperkalemia already improving status post intravenous fluids on repeat chemistry  Monitoring patient  on telemetry    Hypothyroidism   Temporarily holding Synthroid  Will restart postoperatively.    Lung infiltrate on CT   Patchy nodular infiltrates noted in the lower right lung on CT imaging  No clinical evidence of pneumonia, possibly related to balance of vomiting/aspiration this morning as well.  Patient is on antibiotics anyway for acute cholecystitis -we will decide on whether antibiotics are warranted for these infiltrates depending on patient's clinical course.    Essential tremor   We will restart primidone postoperatively    Mixed hyperlipidemia  We will restart statin therapy postoperatively    Code Status:  DNR -confirmed with patient at time of admission Family Communication: deferred     Status is: Observation  The patient remains OBS appropriate and will d/c before 2 midnights.  Dispo: The patient is from: ALF              Anticipated d/c is to: ALF              Anticipated d/c date is: 2 days              Patient currently is not medically stable to d/c.        Marinda Elk MD [facility] Pager [phone]  If 7PM-7AM, please contact night-coverage [email or web address] Use universal [place] password for that web site. If you do not have the password, please call the hospital operator.  01/02/2020, 8:41 PM

## 2020-01-02 NOTE — ED Notes (Signed)
Pt provided water. Pt became nauseous and had one episode of vomiting. Pt given 4 mg Zofran verbal order Dr. Leafy Half at bedside

## 2020-01-02 NOTE — ED Triage Notes (Signed)
Pt from [facility] for chest pain that started around 430 this morning, radiates to her shoulders and back. Given 324 ASA en route. No other symptoms, pt a.o

## 2020-01-02 NOTE — Consult Note (Signed)
Reason for Consult:ab pain Referring Physician: Ebbie Latus Der Hicks is an 86 y.o. female.  HPI: 31 yof senior living resident with pmh of tia, htn presents with first episode of epigastric pain radiating to her back and to her sides. This is still persisting. Nothing is helping this.  Still present. There is n/v. No change in bms.  She has no prior abdominal surgery. She underwent dissection protocol ct scan that shows no concern for dissection but does show an impacted 6 mm stone in neck of gb.  An US shows an 8 mm stone in  Neck that is nonmobile, gbw 4 mm with 6-7 mm duct.  Her lfts and lipase are normal. I was asked to see her.   Past Medical History:  Diagnosis Date  . Depression   . Guillain Barr syndrome (HCC)   . Hormone replacement therapy   . Hyperlipidemia   . Hypertension   . Hypothyroid   . Osteoporosis   . Rosacea   . TIA (transient ischemic attack)   . Tremor     Past Surgical History:  Procedure Laterality Date  . FACIAL COSMETIC SURGERY    . HEEL SPUR SURGERY    . TONSILLECTOMY      Family History  Problem Relation Age of Onset  . Stroke Mother   . Hypertension Father   . CAD Father   . Prostate cancer Father     Social History:  reports that she has quit smoking. She has never used smokeless tobacco. She reports that she does not drink alcohol and does not use drugs.  Allergies:  Allergies  Allergen Reactions  . Hydroquinone     Pinkness and edema of face and eyelids, severe    Medications: I have reviewed the patient's current medications.  Results for orders placed or performed during the hospital encounter of 01/02/20 (from the past 48 hour(s))  Basic metabolic panel     Status: Abnormal   Collection Time: 01/02/20 12:59 PM  Result Value Ref Range   Sodium 132 (L) 135 - 145 mmol/L   Potassium 5.7 (H) 3.5 - 5.1 mmol/L    Comment: HEMOLYSIS AT THIS LEVEL MAY AFFECT RESULT   Chloride 98 98 - 111 mmol/L   CO2 24 22 - 32  mmol/L   Glucose, Bld 183 (H) 70 - 99 mg/dL    Comment: Glucose reference range applies only to samples taken after fasting for at least 8 hours.   BUN 17 8 - 23 mg/dL   Creatinine, Ser 1.61 (H) 0.44 - 1.00 mg/dL   Calcium 8.8 (L) 8.9 - 10.3 mg/dL   GFR, Estimated 40 (L) >60 mL/min   Anion gap 10 5 - 15    Comment: Performed at [facility], [address], [place], [place] [address]  CBC     Status: None   Collection Time: 01/02/20 12:59 PM  Result Value Ref Range   WBC 9.3 4.0 - 10.5 K/uL   RBC 4.07 3.87 - 5.11 MIL/uL   Hemoglobin 13.2 12.0 - 15.0 g/dL   HCT 54.0 36 - 46 %   MCV 96.3 80.0 - 100.0 fL   MCH 32.4 26.0 - 34.0 pg   MCHC 33.7 30.0 - 36.0 g/dL   RDW 98.1 19.1 - 47.8 %   Platelets 260 150 - 400 K/uL   nRBC 0.0 0.0 - 0.2 %    Comment: Performed at [facility], [address] [place],  [place] [address]  Troponin I (High Sensitivity)     Status: None   Collection Time: 01/02/20 12:59 PM  Result Value Ref Range   Troponin I (High Sensitivity) 8 <18 ng/L    Comment: (NOTE) Elevated high sensitivity troponin I (hsTnI) values and significant  changes across serial measurements may suggest ACS but many other  chronic and acute conditions are known to elevate hsTnI results.  Refer to the Links section for chest pain algorithms and additional  guidance. Performed at [facility], [address] [place], [place] [address]   Hepatic function panel     Status: None   Collection Time: 01/02/20  3:19 PM  Result Value Ref Range   Total Protein 6.7 6.5 - 8.1 g/dL   Albumin 3.6 3.5 - 5.0 g/dL   AST 32 15 - 41 U/L   ALT 24 0 - 44 U/L   Alkaline Phosphatase 73 38 - 126 U/L   Total Bilirubin 0.5 0.3 - 1.2 mg/dL   Bilirubin, Direct 0.2 0.0 - 0.2 mg/dL   Indirect Bilirubin 0.3 0.3 - 0.9 mg/dL    Comment: Performed at [facility], [address] [place], [place] [address]  Lipase, blood     Status: None   Collection Time: 01/02/20  3:19 PM  Result  Value Ref Range   Lipase 31 11 - 51 U/L    Comment: Performed at [facility], [address], [place], [place] [address]  Magnesium     Status: Abnormal   Collection Time: 01/02/20  3:19 PM  Result Value Ref Range   Magnesium 1.6 (L) 1.7 - 2.4 mg/dL    Comment: Performed at [facility], [address] [place], [place] [address]  Troponin I (High Sensitivity)     Status: None   Collection Time: 01/02/20  3:19 PM  Result Value Ref Range   Troponin I (High Sensitivity) 11 <18 ng/L    Comment: (NOTE) Elevated high sensitivity troponin I (hsTnI) values and significant  changes across serial measurements may suggest ACS but many other  chronic and acute conditions are known to elevate hsTnI results.  Refer to the Links section for chest pain algorithms and additional  guidance. Performed at [facility], [address], [place], [place] [address]   Basic metabolic panel     Status: Abnormal   Collection Time: 01/02/20  3:19 PM  Result Value Ref Range   Sodium 135 135 - 145 mmol/L   Potassium 3.8 3.5 - 5.1 mmol/L   Chloride 99 98 - 111 mmol/L   CO2 23 22 - 32 mmol/L   Glucose, Bld 154 (H) 70 - 99 mg/dL    Comment: Glucose reference range applies only to samples taken after fasting for at least 8 hours.   BUN 18 8 - 23 mg/dL   Creatinine, Ser 3.55 (H) 0.44 - 1.00 mg/dL   Calcium 8.9 8.9 - 73.2 mg/dL   GFR, Estimated 42 (L) >60 mL/min   Anion gap 13 5 - 15    Comment: Performed at [facility], [address] [place], [place] [address]  Urinalysis, Routine w reflex microscopic     Status: Abnormal   Collection Time: 01/02/20  4:37 PM  Result Value Ref Range   Color, Urine YELLOW YELLOW   APPearance CLEAR CLEAR   Specific Gravity, Urine 1.017 1.005 - 1.030   pH 5.0 5.0 - 8.0   Glucose, UA NEGATIVE NEGATIVE  mg/dL   Hgb urine dipstick NEGATIVE NEGATIVE   Bilirubin Urine NEGATIVE NEGATIVE   Ketones, ur NEGATIVE NEGATIVE mg/dL   Protein, ur 30 (A) NEGATIVE  mg/dL   Nitrite NEGATIVE NEGATIVE   Leukocytes,Ua NEGATIVE NEGATIVE   RBC / HPF 0-5 0 - 5 RBC/hpf   WBC, UA 0-5 0 - 5 WBC/hpf   Bacteria, UA NONE SEEN NONE SEEN   Squamous Epithelial / LPF 0-5 0 - 5   Mucus PRESENT     Comment: Performed at [facility], [address] [place], [place] [address]  Respiratory Panel by RT PCR (Flu A&B, Covid) - Nasopharyngeal Swab     Status: None   Collection Time: 01/02/20  6:37 PM   Specimen: Nasopharyngeal Swab  Result Value Ref Range   SARS Coronavirus 2 by RT PCR NEGATIVE NEGATIVE    Comment: (NOTE) SARS-CoV-2 target nucleic acids are NOT DETECTED.  The SARS-CoV-2 RNA is generally detectable in upper respiratoy specimens during the acute phase of infection. The lowest concentration of SARS-CoV-2 viral copies this assay can detect is 131 copies/mL. A negative result does not preclude SARS-Cov-2 infection and should not be used as the sole basis for treatment or other patient management decisions. A negative result may occur with  improper specimen collection/handling, submission of specimen other than nasopharyngeal swab, presence of viral mutation(s) within the areas targeted by this assay, and inadequate number of viral copies (<131 copies/mL). A negative result must be combined with clinical observations, patient history, and epidemiological information. The expected result is Negative.  Fact Sheet for Patients:  https://www.moore.com/https://www.fda.gov/media/142436/download  Fact Sheet for Healthcare Providers:  https://www.young.biz/https://www.fda.gov/media/142435/download  This test is no t yet approved or cleared by the [place] FDA and  has been authorized for detection and/or diagnosis of SARS-CoV-2 by FDA under an Emergency Use Authorization (EUA). This EUA will remain  in effect (meaning this test can be used) for the duration of the COVID-19 declaration under Section 564(b)(1) of the Act, 21 U.S.C. section 360bbb-3(b)(1), unless the authorization is  terminated or revoked sooner.     Influenza A by PCR NEGATIVE NEGATIVE   Influenza B by PCR NEGATIVE NEGATIVE    Comment: (NOTE) The Xpert Xpress SARS-CoV-2/FLU/RSV assay is intended as an aid in  the diagnosis of influenza from Nasopharyngeal swab specimens and  should not be used as a sole basis for treatment. Nasal washings and  aspirates are unacceptable for Xpert Xpress SARS-CoV-2/FLU/RSV  testing.  Fact Sheet for Patients: https://www.moore.com/https://www.fda.gov/media/142436/download  Fact Sheet for Healthcare Providers: https://www.young.biz/https://www.fda.gov/media/142435/download  This test is not yet approved or cleared by the [place] FDA and  has been authorized for detection and/or diagnosis of SARS-CoV-2 by  FDA under an Emergency Use Authorization (EUA). This EUA will remain  in effect (meaning this test can be used) for the duration of the  Covid-19 declaration under Section 564(b)(1) of the Act, 21  U.S.C. section 360bbb-3(b)(1), unless the authorization is  terminated or revoked. Performed at [facility], [address] [place], [place] [address]     DG Chest 2 View  Result Date: 01/02/2020 CLINICAL DATA:  Chest pain. EXAM: CHEST - 2 VIEW COMPARISON:  April 22, 2015. FINDINGS: The heart size and mediastinal contours are within normal limits. Both lungs are clear. No pneumothorax or pleural effusion is noted. The visualized skeletal structures are unremarkable. IMPRESSION: No active cardiopulmonary disease. Aortic Atherosclerosis (ICD10-I70.0). Electronically Signed   By: Lupita RaiderJames  Green Jr M.D.   On: 01/02/2020  13:32   CT Angio Chest/Abd/Pel for Dissection W and/or W/WO  Result Date: 01/02/2020 CLINICAL DATA:  Chest pain, abdominal pain, aortic dissection EXAM: CT ANGIOGRAPHY CHEST, ABDOMEN AND PELVIS TECHNIQUE: Non-contrast CT of the chest was initially obtained. Multidetector CT imaging through the chest, abdomen and pelvis was performed using the standard protocol during bolus  administration of intravenous contrast. Multiplanar reconstructed images and MIPs were obtained and reviewed to evaluate the vascular anatomy. CONTRAST:  51mL OMNIPAQUE IOHEXOL 350 MG/ML SOLN COMPARISON:  None. FINDINGS: CTA CHEST FINDINGS Cardiovascular: The thoracic aorta is normal in caliber. No evidence of intramural hematoma, dissection, or aneurysm. There is extensive atherosclerotic calcification identified within the aortic arch and proximal arch vasculature resulting in a less than 50% stenosis of the left common carotid artery at its origin. Bovine arch anatomy noted. Pulmonary arterial caliber is within normal limits. Global cardiac size is within normal limits. Moderate atherosclerotic calcification of the coronary arteries. Moderate calcification of the mitral valve annulus. The aortic valve is trileaflet. Mediastinum/Nodes: There is shotty mediastinal adenopathy present without frankly pathologic enlargement noted. Thyroid unremarkable. Esophagus unremarkable. Lungs/Pleura: Scattered nodular infiltrates within the right upper lobe are nonspecific and may be infectious or inflammatory in nature. Scarring noted within the right lung base. No pneumothorax or pleural effusion. Central airways are widely patent. Musculoskeletal: No acute bone abnormality. Review of the MIP images confirms the above findings. CTA ABDOMEN AND PELVIS FINDINGS VASCULAR Aorta: The abdominal aorta is normal in caliber; no evidence of aneurysm or dissection. There is extensive atherosclerotic calcification noted within the abdominal aorta. No evidence of hemodynamically significant stenosis. Celiac: Widely patent.  Conventional anatomy.  No aneurysm. SMA: Widely patent.  Conventional anatomy. Renals: Dual right and single left renal arteries are widely patent. Normal arterial contour. No aneurysm. IMA: Widely patent. Inflow: Widely patent. Internal iliac arteries are patent bilaterally. Veins: Not well opacified. Review of the MIP  images confirms the above findings. NON-VASCULAR Hepatobiliary: The gallbladder is distended. A calcified 6 mm gallstone is seen impacted within the gallbladder neck. The gallbladder wall appears mildly edematous and, together, the findings are suggestive of early changes of acute cholecystitis. No significant internal or external biliary ductal dilation. Pancreas: Unremarkable Spleen: Unremarkable Adrenals/Urinary Tract: The adrenal glands are unremarkable. Simple cortical cysts are seen bilaterally. Mild renal cortical atrophy. The kidneys are otherwise unremarkable. Bladder is unremarkable. Stomach/Bowel: The stomach, small bowel, and large bowel are unremarkable. The appendix is normal. Tiny fat containing umbilical hernia. No free intraperitoneal gas. Tiny bilateral fat containing inguinal hernias. Lymphatic: No pathologic adenopathy within the abdomen and pelvis. Reproductive: Uterus and bilateral adnexa are unremarkable. Other: Rectum unremarkable Musculoskeletal: No acute bone abnormality. Advanced degenerative changes of the lumbosacral junction. Review of the MIP images confirms the above findings. IMPRESSION: No evidence of aortic aneurysm or dissection. Impacted 6 mm gallstone within the a gallbladder neck with gallbladder distension and edematous change suggestive of early acute calculus cholecystitis. Correlation with liver enzymes and physical examination is recommended. Scattered nodular infiltrates within the right upper lobe, nonspecific, possibly infectious or inflammatory in nature. Aortic Atherosclerosis (ICD10-I70.0). Electronically Signed   By: Helyn Numbers MD   On: 01/02/2020 17:33   US Abdomen Limited RUQ (LIVER/GB)  Result Date: 01/02/2020 CLINICAL DATA:  Abdominal pain.  Abnormal CT. EXAM: ULTRASOUND ABDOMEN LIMITED RIGHT UPPER QUADRANT COMPARISON:  CTA chest earlier today.  CT 04/19/2019 FINDINGS: Gallbladder: 8 mm gallstone within the gallbladder neck, non mobile. Sludge within  the gallbladder. Gallbladder wall thickening measuring  4 mm. Common bile duct: Diameter: Normal caliber, 6-7 mm. Liver: No focal lesion identified. Within normal limits in parenchymal echogenicity. Portal vein is patent on color Doppler imaging with normal direction of blood flow towards the liver. Other: None. IMPRESSION: 8 mm gallstone lodged within the gallbladder neck. Layering sludge within the gallbladder and gallbladder wall thickening. Appearance is concerning for possible acute cholecystitis. Electronically Signed   By: Charlett Nose M.D.   On: 01/02/2020 20:09    Review of Systems  Gastrointestinal: Positive for abdominal pain, nausea and vomiting.  All other systems reviewed and are negative.  Blood pressure (!) 186/63, pulse 62, temperature 98.3 F (36.8 C), temperature source Oral, resp. rate 18, SpO2 98 %. Physical Exam Constitutional:      General: She is not in acute distress.    Appearance: She is well-developed.  HENT:     Head: Normocephalic and atraumatic.  Eyes:     Extraocular Movements: Extraocular movements intact.     Pupils: Pupils are equal, round, and reactive to light.  Neck:     Vascular: No JVD.  Cardiovascular:     Rate and Rhythm: Normal rate and regular rhythm.  Pulmonary:     Effort: Pulmonary effort is normal.     Breath sounds: Normal breath sounds.  Abdominal:     General: Bowel sounds are normal.     Palpations: Abdomen is soft. Splenomegaly: epigastrium and ruq.     Tenderness: There is abdominal tenderness.  Musculoskeletal:     Cervical back: Normal range of motion and neck supple.     Right lower leg: No edema.     Left lower leg: No edema.  Lymphadenopathy:     Cervical: No cervical adenopathy.  Skin:    General: Skin is warm and dry.     Capillary Refill: Capillary refill takes less than 2 seconds.  Neurological:     General: No focal deficit present.     Mental Status: She is alert.     Assessment/Plan: Acute  cholecystitis -recheck labs am -will need to discuss with family also -abx, npo after mn -likely laparoscopic cholecystectomy in am  Brandy Hicks 01/02/2020, 8:19 PM

## 2020-01-02 NOTE — H&P (View-Only) (Signed)
Reason for Consult:ab pain Referring Physician: Claudia Gibbons  Brandy Hicks is an 87 y.o. female.  HPI: 87 yof senior living resident with pmh of tia, htn presents with first episode of epigastric pain radiating to her back and to her sides. This is still persisting. Nothing is helping this.  Still present. There is n/v. No change in bms.  She has no prior abdominal surgery. She underwent dissection protocol ct scan that shows no concern for dissection but does show an impacted 6 mm stone in neck of gb.  An us shows an 8 mm stone in  Neck that is nonmobile, gbw 4 mm with 6-7 mm duct.  Her lfts and lipase are normal. I was asked to see her.   Past Medical History:  Diagnosis Date  . Depression   . Guillain Barr syndrome (HCC)   . Hormone replacement therapy   . Hyperlipidemia   . Hypertension   . Hypothyroid   . Osteoporosis   . Rosacea   . TIA (transient ischemic attack)   . Tremor     Past Surgical History:  Procedure Laterality Date  . FACIAL COSMETIC SURGERY    . HEEL SPUR SURGERY    . TONSILLECTOMY      Family History  Problem Relation Age of Onset  . Stroke Mother   . Hypertension Father   . CAD Father   . Prostate cancer Father     Social History:  reports that she has quit smoking. She has never used smokeless tobacco. She reports that she does not drink alcohol and does not use drugs.  Allergies:  Allergies  Allergen Reactions  . Hydroquinone     Pinkness and edema of face and eyelids, severe    Medications: I have reviewed the patient's current medications.  Results for orders placed or performed during the hospital encounter of 01/02/20 (from the past 48 hour(s))  Basic metabolic panel     Status: Abnormal   Collection Time: 01/02/20 12:59 PM  Result Value Ref Range   Sodium 132 (L) 135 - 145 mmol/L   Potassium 5.7 (H) 3.5 - 5.1 mmol/L    Comment: HEMOLYSIS AT THIS LEVEL MAY AFFECT RESULT   Chloride 98 98 - 111 mmol/L   CO2 24 22 - 32  mmol/L   Glucose, Bld 183 (H) 70 - 99 mg/dL    Comment: Glucose reference range applies only to samples taken after fasting for at least 8 hours.   BUN 17 8 - 23 mg/dL   Creatinine, Ser 1.22 (H) 0.44 - 1.00 mg/dL   Calcium 8.8 (L) 8.9 - 10.3 mg/dL   GFR, Estimated 40 (L) >60 mL/min   Anion gap 10 5 - 15    Comment: Performed at [facility], [address] [place], [place] [address]  CBC     Status: None   Collection Time: 01/02/20 12:59 PM  Result Value Ref Range   WBC 9.3 4.0 - 10.5 K/uL   RBC 4.07 3.87 - 5.11 MIL/uL   Hemoglobin 13.2 12.0 - 15.0 g/dL   HCT 39.2 36 - 46 %   MCV 96.3 80.0 - 100.0 fL   MCH 32.4 26.0 - 34.0 pg   MCHC 33.7 30.0 - 36.0 g/dL   RDW 12.1 11.5 - 15.5 %   Platelets 260 150 - 400 K/uL   nRBC 0.0 0.0 - 0.2 %    Comment: Performed at [facility], [address] ,   [place] [address]  Troponin I (High Sensitivity)     Status: None   Collection Time: 01/02/20 12:59 PM  Result Value Ref Range   Troponin I (High Sensitivity) 8 <18 ng/L    Comment: (NOTE) Elevated high sensitivity troponin I (hsTnI) values and significant  changes across serial measurements may suggest ACS but many other  chronic and acute conditions are known to elevate hsTnI results.  Refer to the Links section for chest pain algorithms and additional  guidance. Performed at [facility], [address] [place], [place] [address]   Hepatic function panel     Status: None   Collection Time: 01/02/20  3:19 PM  Result Value Ref Range   Total Protein 6.7 6.5 - 8.1 g/dL   Albumin 3.6 3.5 - 5.0 g/dL   AST 32 15 - 41 U/L   ALT 24 0 - 44 U/L   Alkaline Phosphatase 73 38 - 126 U/L   Total Bilirubin 0.5 0.3 - 1.2 mg/dL   Bilirubin, Direct 0.2 0.0 - 0.2 mg/dL   Indirect Bilirubin 0.3 0.3 - 0.9 mg/dL    Comment: Performed at [facility], [address] [place], [place] [address]  Lipase, blood     Status: None   Collection Time: 01/02/20  3:19 PM  Result  Value Ref Range   Lipase 31 11 - 51 U/L    Comment: Performed at [facility], [address] [place], [place] [address]  Magnesium     Status: Abnormal   Collection Time: 01/02/20  3:19 PM  Result Value Ref Range   Magnesium 1.6 (L) 1.7 - 2.4 mg/dL    Comment: Performed at [facility], [address] [place], [place] [address]  Troponin I (High Sensitivity)     Status: None   Collection Time: 01/02/20  3:19 PM  Result Value Ref Range   Troponin I (High Sensitivity) 11 <18 ng/L    Comment: (NOTE) Elevated high sensitivity troponin I (hsTnI) values and significant  changes across serial measurements may suggest ACS but many other  chronic and acute conditions are known to elevate hsTnI results.  Refer to the Links section for chest pain algorithms and additional  guidance. Performed at [facility], [address] [place], [place] [address]   Basic metabolic panel     Status: Abnormal   Collection Time: 01/02/20  3:19 PM  Result Value Ref Range   Sodium 135 135 - 145 mmol/L   Potassium 3.8 3.5 - 5.1 mmol/L   Chloride 99 98 - 111 mmol/L   CO2 23 22 - 32 mmol/L   Glucose, Bld 154 (H) 70 - 99 mg/dL    Comment: Glucose reference range applies only to samples taken after fasting for at least 8 hours.   BUN 18 8 - 23 mg/dL   Creatinine, Ser 1.17 (H) 0.44 - 1.00 mg/dL   Calcium 8.9 8.9 - 10.3 mg/dL   GFR, Estimated 42 (L) >60 mL/min   Anion gap 13 5 - 15    Comment: Performed at [facility], [address] [place], [place] [address]  Urinalysis, Routine w reflex microscopic     Status: Abnormal   Collection Time: 01/02/20  4:37 PM  Result Value Ref Range   Color, Urine YELLOW YELLOW   APPearance CLEAR CLEAR   Specific Gravity, Urine 1.017 1.005 - 1.030   pH 5.0 5.0 - 8.0   Glucose, UA NEGATIVE NEGATIVE   mg/dL   Hgb urine dipstick NEGATIVE NEGATIVE   Bilirubin Urine NEGATIVE NEGATIVE   Ketones, ur NEGATIVE NEGATIVE mg/dL   Protein, ur 30 (A) NEGATIVE  mg/dL   Nitrite NEGATIVE NEGATIVE   Leukocytes,Ua NEGATIVE NEGATIVE   RBC / HPF 0-5 0 - 5 RBC/hpf   WBC, UA 0-5 0 - 5 WBC/hpf   Bacteria, UA NONE SEEN NONE SEEN   Squamous Epithelial / LPF 0-5 0 - 5   Mucus PRESENT     Comment: Performed at [facility], [address] [place], [place] [address]  Respiratory Panel by RT PCR (Flu A&B, Covid) - Nasopharyngeal Swab     Status: None   Collection Time: 01/02/20  6:37 PM   Specimen: Nasopharyngeal Swab  Result Value Ref Range   SARS Coronavirus 2 by RT PCR NEGATIVE NEGATIVE    Comment: (NOTE) SARS-CoV-2 target nucleic acids are NOT DETECTED.  The SARS-CoV-2 RNA is generally detectable in upper respiratoy specimens during the acute phase of infection. The lowest concentration of SARS-CoV-2 viral copies this assay can detect is 131 copies/mL. A negative result does not preclude SARS-Cov-2 infection and should not be used as the sole basis for treatment or other patient management decisions. A negative result may occur with  improper specimen collection/handling, submission of specimen other than nasopharyngeal swab, presence of viral mutation(s) within the areas targeted by this assay, and inadequate number of viral copies (<131 copies/mL). A negative result must be combined with clinical observations, patient history, and epidemiological information. The expected result is Negative.  Fact Sheet for Patients:  https://www.fda.gov/media/142436/download  Fact Sheet for Healthcare Providers:  https://www.fda.gov/media/142435/download  This test is no t yet approved or cleared by the [place] FDA and  has been authorized for detection and/or diagnosis of SARS-CoV-2 by FDA under an Emergency Use Authorization (EUA). This EUA will remain  in effect (meaning this test can be used) for the duration of the COVID-19 declaration under Section 564(b)(1) of the Act, 21 U.S.C. section 360bbb-3(b)(1), unless the authorization is  terminated or revoked sooner.     Influenza A by PCR NEGATIVE NEGATIVE   Influenza B by PCR NEGATIVE NEGATIVE    Comment: (NOTE) The Xpert Xpress SARS-CoV-2/FLU/RSV assay is intended as an aid in  the diagnosis of influenza from Nasopharyngeal swab specimens and  should not be used as a sole basis for treatment. Nasal washings and  aspirates are unacceptable for Xpert Xpress SARS-CoV-2/FLU/RSV  testing.  Fact Sheet for Patients: https://www.fda.gov/media/142436/download  Fact Sheet for Healthcare Providers: https://www.fda.gov/media/142435/download  This test is not yet approved or cleared by the [place] FDA and  has been authorized for detection and/or diagnosis of SARS-CoV-2 by  FDA under an Emergency Use Authorization (EUA). This EUA will remain  in effect (meaning this test can be used) for the duration of the  Covid-19 declaration under Section 564(b)(1) of the Act, 21  U.S.C. section 360bbb-3(b)(1), unless the authorization is  terminated or revoked. Performed at [facility], [address] , [place] [address]     DG Chest 2 View  Result Date: 01/02/2020 CLINICAL DATA:  Chest pain. EXAM: CHEST - 2 VIEW COMPARISON:  April 22, 2015. FINDINGS: The heart size and mediastinal contours are within normal limits. Both lungs are clear. No pneumothorax or pleural effusion is noted. The visualized skeletal structures are unremarkable. IMPRESSION: No active cardiopulmonary disease. Aortic Atherosclerosis (ICD10-I70.0). Electronically Signed   By: James  Green Jr M.D.   On: 01/02/2020   13:32   CT Angio Chest/Abd/Pel for Dissection W and/or W/WO  Result Date: 01/02/2020 CLINICAL DATA:  Chest pain, abdominal pain, aortic dissection EXAM: CT ANGIOGRAPHY CHEST, ABDOMEN AND PELVIS TECHNIQUE: Non-contrast CT of the chest was initially obtained. Multidetector CT imaging through the chest, abdomen and pelvis was performed using the standard protocol during bolus  administration of intravenous contrast. Multiplanar reconstructed images and MIPs were obtained and reviewed to evaluate the vascular anatomy. CONTRAST:  50mL OMNIPAQUE IOHEXOL 350 MG/ML SOLN COMPARISON:  None. FINDINGS: CTA CHEST FINDINGS Cardiovascular: The thoracic aorta is normal in caliber. No evidence of intramural hematoma, dissection, or aneurysm. There is extensive atherosclerotic calcification identified within the aortic arch and proximal arch vasculature resulting in a less than 50% stenosis of the left common carotid artery at its origin. Bovine arch anatomy noted. Pulmonary arterial caliber is within normal limits. Global cardiac size is within normal limits. Moderate atherosclerotic calcification of the coronary arteries. Moderate calcification of the mitral valve annulus. The aortic valve is trileaflet. Mediastinum/Nodes: There is shotty mediastinal adenopathy present without frankly pathologic enlargement noted. Thyroid unremarkable. Esophagus unremarkable. Lungs/Pleura: Scattered nodular infiltrates within the right upper lobe are nonspecific and may be infectious or inflammatory in nature. Scarring noted within the right lung base. No pneumothorax or pleural effusion. Central airways are widely patent. Musculoskeletal: No acute bone abnormality. Review of the MIP images confirms the above findings. CTA ABDOMEN AND PELVIS FINDINGS VASCULAR Aorta: The abdominal aorta is normal in caliber; no evidence of aneurysm or dissection. There is extensive atherosclerotic calcification noted within the abdominal aorta. No evidence of hemodynamically significant stenosis. Celiac: Widely patent.  Conventional anatomy.  No aneurysm. SMA: Widely patent.  Conventional anatomy. Renals: Dual right and single left renal arteries are widely patent. Normal arterial contour. No aneurysm. IMA: Widely patent. Inflow: Widely patent. Internal iliac arteries are patent bilaterally. Veins: Not well opacified. Review of the MIP  images confirms the above findings. NON-VASCULAR Hepatobiliary: The gallbladder is distended. A calcified 6 mm gallstone is seen impacted within the gallbladder neck. The gallbladder wall appears mildly edematous and, together, the findings are suggestive of early changes of acute cholecystitis. No significant internal or external biliary ductal dilation. Pancreas: Unremarkable Spleen: Unremarkable Adrenals/Urinary Tract: The adrenal glands are unremarkable. Simple cortical cysts are seen bilaterally. Mild renal cortical atrophy. The kidneys are otherwise unremarkable. Bladder is unremarkable. Stomach/Bowel: The stomach, small bowel, and large bowel are unremarkable. The appendix is normal. Tiny fat containing umbilical hernia. No free intraperitoneal gas. Tiny bilateral fat containing inguinal hernias. Lymphatic: No pathologic adenopathy within the abdomen and pelvis. Reproductive: Uterus and bilateral adnexa are unremarkable. Other: Rectum unremarkable Musculoskeletal: No acute bone abnormality. Advanced degenerative changes of the lumbosacral junction. Review of the MIP images confirms the above findings. IMPRESSION: No evidence of aortic aneurysm or dissection. Impacted 6 mm gallstone within the a gallbladder neck with gallbladder distension and edematous change suggestive of early acute calculus cholecystitis. Correlation with liver enzymes and physical examination is recommended. Scattered nodular infiltrates within the right upper lobe, nonspecific, possibly infectious or inflammatory in nature. Aortic Atherosclerosis (ICD10-I70.0). Electronically Signed   By: Ashesh  Parikh MD   On: 01/02/2020 17:33   US Abdomen Limited RUQ (LIVER/GB)  Result Date: 01/02/2020 CLINICAL DATA:  Abdominal pain.  Abnormal CT. EXAM: ULTRASOUND ABDOMEN LIMITED RIGHT UPPER QUADRANT COMPARISON:  CTA chest earlier today.  CT 04/19/2019 FINDINGS: Gallbladder: 8 mm gallstone within the gallbladder neck, non mobile. Sludge within  the gallbladder. Gallbladder wall thickening measuring   4 mm. Common bile duct: Diameter: Normal caliber, 6-7 mm. Liver: No focal lesion identified. Within normal limits in parenchymal echogenicity. Portal vein is patent on color Doppler imaging with normal direction of blood flow towards the liver. Other: None. IMPRESSION: 8 mm gallstone lodged within the gallbladder neck. Layering sludge within the gallbladder and gallbladder wall thickening. Appearance is concerning for possible acute cholecystitis. Electronically Signed   By: Kevin  Dover M.D.   On: 01/02/2020 20:09    Review of Systems  Gastrointestinal: Positive for abdominal pain, nausea and vomiting.  All other systems reviewed and are negative.  Blood pressure (!) 186/63, pulse 62, temperature 98.3 F (36.8 C), temperature source Oral, resp. rate 18, SpO2 98 %. Physical Exam Constitutional:      General: She is not in acute distress.    Appearance: She is well-developed.  HENT:     Head: Normocephalic and atraumatic.  Eyes:     Extraocular Movements: Extraocular movements intact.     Pupils: Pupils are equal, round, and reactive to light.  Neck:     Vascular: No JVD.  Cardiovascular:     Rate and Rhythm: Normal rate and regular rhythm.  Pulmonary:     Effort: Pulmonary effort is normal.     Breath sounds: Normal breath sounds.  Abdominal:     General: Bowel sounds are normal.     Palpations: Abdomen is soft. Splenomegaly: epigastrium and ruq.     Tenderness: There is abdominal tenderness.  Musculoskeletal:     Cervical back: Normal range of motion and neck supple.     Right lower leg: No edema.     Left lower leg: No edema.  Lymphadenopathy:     Cervical: No cervical adenopathy.  Skin:    General: Skin is warm and dry.     Capillary Refill: Capillary refill takes less than 2 seconds.  Neurological:     General: No focal deficit present.     Mental Status: She is alert.     Assessment/Plan: Acute  cholecystitis -recheck labs am -will need to discuss with family also -abx, npo after mn -likely laparoscopic cholecystectomy in am  Shirly Bartosiewicz 01/02/2020, 8:19 PM     

## 2020-01-03 ENCOUNTER — Observation Stay (HOSPITAL_COMMUNITY): Payer: Medicare Other | Admitting: Certified Registered Nurse Anesthetist

## 2020-01-03 ENCOUNTER — Encounter (HOSPITAL_COMMUNITY): Payer: Self-pay | Admitting: Internal Medicine

## 2020-01-03 ENCOUNTER — Encounter (HOSPITAL_COMMUNITY)
Admission: EM | Disposition: A | Discharge status: Home Health | Payer: Self-pay | Source: Skilled Nursing Facility | Attending: Internal Medicine

## 2020-01-03 DIAGNOSIS — D72828 Other elevated white blood cell count: Secondary | ICD-10-CM | POA: Diagnosis present

## 2020-01-03 DIAGNOSIS — E875 Hyperkalemia: Secondary | ICD-10-CM | POA: Diagnosis present

## 2020-01-03 DIAGNOSIS — M810 Age-related osteoporosis without current pathological fracture: Secondary | ICD-10-CM | POA: Diagnosis present

## 2020-01-03 DIAGNOSIS — Z8673 Personal history of transient ischemic attack (TIA), and cerebral infarction without residual deficits: Secondary | ICD-10-CM | POA: Diagnosis not present

## 2020-01-03 DIAGNOSIS — G250 Essential tremor: Secondary | ICD-10-CM | POA: Diagnosis present

## 2020-01-03 DIAGNOSIS — E782 Mixed hyperlipidemia: Secondary | ICD-10-CM | POA: Diagnosis present

## 2020-01-03 DIAGNOSIS — E876 Hypokalemia: Secondary | ICD-10-CM | POA: Diagnosis present

## 2020-01-03 DIAGNOSIS — Z87891 Personal history of nicotine dependence: Secondary | ICD-10-CM | POA: Diagnosis not present

## 2020-01-03 DIAGNOSIS — Z20822 Contact with and (suspected) exposure to covid-19: Secondary | ICD-10-CM | POA: Diagnosis present

## 2020-01-03 DIAGNOSIS — Z79890 Hormone replacement therapy (postmenopausal): Secondary | ICD-10-CM | POA: Diagnosis not present

## 2020-01-03 DIAGNOSIS — Z79899 Other long term (current) drug therapy: Secondary | ICD-10-CM | POA: Diagnosis not present

## 2020-01-03 DIAGNOSIS — K8012 Calculus of gallbladder with acute and chronic cholecystitis without obstruction: Secondary | ICD-10-CM | POA: Diagnosis present

## 2020-01-03 DIAGNOSIS — I10 Essential (primary) hypertension: Secondary | ICD-10-CM | POA: Diagnosis present

## 2020-01-03 DIAGNOSIS — K810 Acute cholecystitis: Secondary | ICD-10-CM | POA: Diagnosis present

## 2020-01-03 DIAGNOSIS — N179 Acute kidney failure, unspecified: Secondary | ICD-10-CM | POA: Diagnosis present

## 2020-01-03 DIAGNOSIS — F32A Depression, unspecified: Secondary | ICD-10-CM | POA: Diagnosis present

## 2020-01-03 DIAGNOSIS — K82A1 Gangrene of gallbladder in cholecystitis: Secondary | ICD-10-CM | POA: Diagnosis present

## 2020-01-03 DIAGNOSIS — E039 Hypothyroidism, unspecified: Secondary | ICD-10-CM | POA: Diagnosis present

## 2020-01-03 DIAGNOSIS — K828 Other specified diseases of gallbladder: Secondary | ICD-10-CM | POA: Diagnosis present

## 2020-01-03 DIAGNOSIS — Z7982 Long term (current) use of aspirin: Secondary | ICD-10-CM | POA: Diagnosis not present

## 2020-01-03 DIAGNOSIS — Z66 Do not resuscitate: Secondary | ICD-10-CM | POA: Diagnosis present

## 2020-01-03 HISTORY — PX: CHOLECYSTECTOMY: SHX55

## 2020-01-03 LAB — URINALYSIS, COMPLETE (UACMP) WITH MICROSCOPIC
Bacteria, UA: NONE SEEN
Bilirubin Urine: NEGATIVE
Glucose, UA: NEGATIVE mg/dL
Hgb urine dipstick: NEGATIVE
Ketones, ur: NEGATIVE mg/dL
Leukocytes,Ua: NEGATIVE
Nitrite: NEGATIVE
Protein, ur: NEGATIVE mg/dL
Specific Gravity, Urine: 1.033 — ABNORMAL HIGH (ref 1.005–1.030)
pH: 5 (ref 5.0–8.0)

## 2020-01-03 LAB — CBC WITH DIFFERENTIAL/PLATELET
Abs Immature Granulocytes: 0.07 10*3/uL (ref 0.00–0.07)
Basophils Absolute: 0 10*3/uL (ref 0.0–0.1)
Basophils Relative: 0 %
Eosinophils Absolute: 0 10*3/uL (ref 0.0–0.5)
Eosinophils Relative: 0 %
HCT: 36.8 % (ref 36.0–46.0)
Hemoglobin: 12.5 g/dL (ref 12.0–15.0)
Immature Granulocytes: 1 %
Lymphocytes Relative: 12 %
Lymphs Abs: 1.6 10*3/uL (ref 0.7–4.0)
MCH: 33.2 pg (ref 26.0–34.0)
MCHC: 34 g/dL (ref 30.0–36.0)
MCV: 97.6 fL (ref 80.0–100.0)
Monocytes Absolute: 1.4 10*3/uL — ABNORMAL HIGH (ref 0.1–1.0)
Monocytes Relative: 10 %
Neutro Abs: 10.3 10*3/uL — ABNORMAL HIGH (ref 1.7–7.7)
Neutrophils Relative %: 77 %
Platelets: 288 10*3/uL (ref 150–400)
RBC: 3.77 MIL/uL — ABNORMAL LOW (ref 3.87–5.11)
RDW: 12.3 % (ref 11.5–15.5)
WBC: 13.3 10*3/uL — ABNORMAL HIGH (ref 4.0–10.5)
nRBC: 0 % (ref 0.0–0.2)

## 2020-01-03 LAB — COMPREHENSIVE METABOLIC PANEL
ALT: 28 U/L (ref 0–44)
AST: 37 U/L (ref 15–41)
Albumin: 3.2 g/dL — ABNORMAL LOW (ref 3.5–5.0)
Alkaline Phosphatase: 63 U/L (ref 38–126)
Anion gap: 10 (ref 5–15)
BUN: 15 mg/dL (ref 8–23)
CO2: 25 mmol/L (ref 22–32)
Calcium: 8.5 mg/dL — ABNORMAL LOW (ref 8.9–10.3)
Chloride: 100 mmol/L (ref 98–111)
Creatinine, Ser: 1.03 mg/dL — ABNORMAL HIGH (ref 0.44–1.00)
GFR, Estimated: 50 mL/min — ABNORMAL LOW (ref >60)
Glucose, Bld: 126 mg/dL — ABNORMAL HIGH (ref 70–99)
Potassium: 3.6 mmol/L (ref 3.5–5.1)
Sodium: 135 mmol/L (ref 135–145)
Total Bilirubin: 0.9 mg/dL (ref 0.3–1.2)
Total Protein: 6.5 g/dL (ref 6.5–8.1)

## 2020-01-03 LAB — APTT: aPTT: 30 seconds (ref 24–36)

## 2020-01-03 LAB — PROTIME-INR
INR: 1.1 (ref 0.8–1.2)
Prothrombin Time: 13.4 seconds (ref 11.4–15.2)

## 2020-01-03 LAB — MAGNESIUM: Magnesium: 1.9 mg/dL (ref 1.7–2.4)

## 2020-01-03 SURGERY — LAPAROSCOPIC CHOLECYSTECTOMY
Anesthesia: General | Site: Abdomen

## 2020-01-03 MED ORDER — MORPHINE SULFATE (PF) 2 MG/ML IV SOLN
2.0000 mg | INTRAVENOUS | Status: DC | PRN
  Administered 2020-01-03: 4 mg via INTRAVENOUS
  Filled 2020-01-03 (×2): qty 1
  Filled 2020-01-03: qty 2

## 2020-01-03 MED ORDER — CHLORHEXIDINE GLUCONATE 0.12 % MT SOLN: 15.0000 mL | Freq: Once | OROMUCOSAL | Status: AC

## 2020-01-03 MED ORDER — HYDROMORPHONE HCL 1 MG/ML IJ SOLN
INTRAMUSCULAR | Status: AC
Start: 2020-01-03 — End: 2020-01-04
  Filled 2020-01-03: qty 1

## 2020-01-03 MED ORDER — PHENYLEPHRINE 40 MCG/ML (10ML) SYRINGE FOR IV PUSH (FOR BLOOD PRESSURE SUPPORT)
PREFILLED_SYRINGE | INTRAVENOUS | Status: AC
  Filled 2020-01-03: qty 20

## 2020-01-03 MED ORDER — MEPERIDINE HCL 25 MG/ML IJ SOLN: 6.2500 mg | INTRAMUSCULAR | Status: DC | PRN

## 2020-01-03 MED ORDER — PHENYLEPHRINE 40 MCG/ML (10ML) SYRINGE FOR IV PUSH (FOR BLOOD PRESSURE SUPPORT)
PREFILLED_SYRINGE | INTRAVENOUS | Status: DC | PRN
  Administered 2020-01-03: 160 ug via INTRAVENOUS
  Administered 2020-01-03: 240 ug via INTRAVENOUS

## 2020-01-03 MED ORDER — EPHEDRINE 5 MG/ML INJ
INTRAVENOUS | Status: AC
  Filled 2020-01-03: qty 10

## 2020-01-03 MED ORDER — PRIMIDONE 50 MG PO TABS
200.0000 mg | ORAL_TABLET | Freq: Two times a day (BID) | ORAL | Status: DC
  Administered 2020-01-04 – 2020-01-07 (×6): 200 mg via ORAL
  Filled 2020-01-03 (×9): qty 4

## 2020-01-03 MED ORDER — PROPOFOL 10 MG/ML IV BOLUS
INTRAVENOUS | Status: AC
  Filled 2020-01-03: qty 20

## 2020-01-03 MED ORDER — FENTANYL CITRATE (PF) 250 MCG/5ML IJ SOLN
INTRAMUSCULAR | Status: AC
  Filled 2020-01-03: qty 5

## 2020-01-03 MED ORDER — DEXAMETHASONE SODIUM PHOSPHATE 10 MG/ML IJ SOLN
INTRAMUSCULAR | Status: AC
  Filled 2020-01-03: qty 1

## 2020-01-03 MED ORDER — DEXAMETHASONE SODIUM PHOSPHATE 10 MG/ML IJ SOLN
INTRAMUSCULAR | Status: DC | PRN
  Administered 2020-01-03: 4 mg via INTRAVENOUS

## 2020-01-03 MED ORDER — FENTANYL CITRATE (PF) 250 MCG/5ML IJ SOLN
INTRAMUSCULAR | Status: DC | PRN
Start: 2020-01-03 — End: 2020-01-03
  Administered 2020-01-03: 50 ug via INTRAVENOUS
  Administered 2020-01-03: 25 ug via INTRAVENOUS
  Administered 2020-01-03: 50 ug via INTRAVENOUS
  Administered 2020-01-03: 25 ug via INTRAVENOUS
  Administered 2020-01-03 (×2): 50 ug via INTRAVENOUS

## 2020-01-03 MED ORDER — LIDOCAINE 2% (20 MG/ML) 5 ML SYRINGE
INTRAMUSCULAR | Status: AC
  Filled 2020-01-03: qty 5

## 2020-01-03 MED ORDER — LIDOCAINE 2% (20 MG/ML) 5 ML SYRINGE
INTRAMUSCULAR | Status: DC | PRN
  Administered 2020-01-03: 100 mg via INTRAVENOUS

## 2020-01-03 MED ORDER — GABAPENTIN 300 MG PO CAPS
300.0000 mg | ORAL_CAPSULE | Freq: Two times a day (BID) | ORAL | Status: DC
  Administered 2020-01-03 – 2020-01-05 (×5): 300 mg via ORAL
  Filled 2020-01-03 (×7): qty 1

## 2020-01-03 MED ORDER — ONDANSETRON HCL 4 MG/2ML IJ SOLN: 4.0000 mg | Freq: Once | INTRAMUSCULAR | Status: DC | PRN

## 2020-01-03 MED ORDER — LORATADINE 10 MG PO TABS
10.0000 mg | ORAL_TABLET | Freq: Every day | ORAL | Status: DC
  Administered 2020-01-03 – 2020-01-05 (×3): 10 mg via ORAL
  Filled 2020-01-03 (×4): qty 1

## 2020-01-03 MED ORDER — BUPIVACAINE HCL (PF) 0.25 % IJ SOLN
INTRAMUSCULAR | Status: AC
  Filled 2020-01-03: qty 30

## 2020-01-03 MED ORDER — DIPHENHYDRAMINE HCL 25 MG PO CAPS
25.0000 mg | ORAL_CAPSULE | Freq: Every day | ORAL | Status: DC
  Administered 2020-01-03 – 2020-01-05 (×3): 25 mg via ORAL
  Filled 2020-01-03 (×4): qty 1

## 2020-01-03 MED ORDER — CHLORHEXIDINE GLUCONATE 0.12 % MT SOLN
OROMUCOSAL | Status: AC
  Administered 2020-01-03: 15 mL via OROMUCOSAL
  Filled 2020-01-03: qty 15

## 2020-01-03 MED ORDER — TRAMADOL HCL 50 MG PO TABS
50.0000 mg | ORAL_TABLET | Freq: Four times a day (QID) | ORAL | Status: DC | PRN
  Administered 2020-01-03 – 2020-01-04 (×2): 50 mg via ORAL
  Filled 2020-01-03 (×3): qty 1

## 2020-01-03 MED ORDER — DEXMEDETOMIDINE (PRECEDEX) IN NS 20 MCG/5ML (4 MCG/ML) IV SYRINGE
PREFILLED_SYRINGE | INTRAVENOUS | Status: AC
  Filled 2020-01-03: qty 10

## 2020-01-03 MED ORDER — ROCURONIUM BROMIDE 10 MG/ML (PF) SYRINGE
PREFILLED_SYRINGE | INTRAVENOUS | Status: DC | PRN
  Administered 2020-01-03: 80 mg via INTRAVENOUS

## 2020-01-03 MED ORDER — PHENYLEPHRINE HCL-NACL 10-0.9 MG/250ML-% IV SOLN
INTRAVENOUS | Status: DC | PRN
  Administered 2020-01-03: 25 ug/min via INTRAVENOUS

## 2020-01-03 MED ORDER — PROPOFOL 10 MG/ML IV BOLUS
INTRAVENOUS | Status: DC | PRN
  Administered 2020-01-03: 150 mg via INTRAVENOUS
  Administered 2020-01-03: 20 mg via INTRAVENOUS
  Administered 2020-01-03: 30 mg via INTRAVENOUS

## 2020-01-03 MED ORDER — FENTANYL CITRATE (PF) 100 MCG/2ML IJ SOLN
25.0000 ug | INTRAMUSCULAR | Status: DC | PRN
  Administered 2020-01-03 – 2020-01-04 (×3): 25 ug via INTRAVENOUS
  Filled 2020-01-03 (×3): qty 2

## 2020-01-03 MED ORDER — ROCURONIUM BROMIDE 10 MG/ML (PF) SYRINGE
PREFILLED_SYRINGE | INTRAVENOUS | Status: AC
  Filled 2020-01-03: qty 10

## 2020-01-03 MED ORDER — HYDROMORPHONE HCL 1 MG/ML IJ SOLN
0.2500 mg | INTRAMUSCULAR | Status: DC | PRN
  Administered 2020-01-03 (×4): 0.25 mg via INTRAVENOUS
  Administered 2020-01-03: 0.5 mg via INTRAVENOUS

## 2020-01-03 MED ORDER — DEXMEDETOMIDINE (PRECEDEX) IN NS 20 MCG/5ML (4 MCG/ML) IV SYRINGE
PREFILLED_SYRINGE | INTRAVENOUS | Status: DC | PRN
  Administered 2020-01-03 (×3): 4 ug via INTRAVENOUS
  Administered 2020-01-03: 8 ug via INTRAVENOUS

## 2020-01-03 MED ORDER — SUGAMMADEX SODIUM 200 MG/2ML IV SOLN
INTRAVENOUS | Status: DC | PRN
  Administered 2020-01-03: 200 mg via INTRAVENOUS

## 2020-01-03 MED ORDER — LACTATED RINGERS IV SOLN: INTRAVENOUS | Status: DC

## 2020-01-03 MED ORDER — FLUTICASONE PROPIONATE 50 MCG/ACT NA SUSP
2.0000 | Freq: Every day | NASAL | Status: DC
  Administered 2020-01-03 – 2020-01-05 (×3): 2 via NASAL
  Filled 2020-01-03: qty 16

## 2020-01-03 MED ORDER — ONDANSETRON HCL 4 MG/2ML IJ SOLN
INTRAMUSCULAR | Status: DC | PRN
  Administered 2020-01-03: 4 mg via INTRAVENOUS

## 2020-01-03 MED ORDER — ACETAMINOPHEN 500 MG PO TABS
ORAL_TABLET | ORAL | Status: AC
  Administered 2020-01-03: 1000 mg via ORAL
  Filled 2020-01-03: qty 2

## 2020-01-03 MED ORDER — 0.9 % SODIUM CHLORIDE (POUR BTL) OPTIME
TOPICAL | Status: DC | PRN
  Administered 2020-01-03: 1000 mL

## 2020-01-03 MED ORDER — ACETAMINOPHEN 500 MG PO TABS: 1000.0000 mg | ORAL_TABLET | ORAL | Status: AC

## 2020-01-03 MED ORDER — ONDANSETRON HCL 4 MG/2ML IJ SOLN
INTRAMUSCULAR | Status: AC
  Filled 2020-01-03: qty 2

## 2020-01-03 MED ORDER — BUPIVACAINE HCL (PF) 0.25 % IJ SOLN
INTRAMUSCULAR | Status: DC | PRN
  Administered 2020-01-03: 11 mL

## 2020-01-03 MED ORDER — ORAL CARE MOUTH RINSE: 15.0000 mL | Freq: Once | OROMUCOSAL | Status: AC

## 2020-01-03 MED ORDER — SODIUM CHLORIDE 0.9 % IR SOLN
Status: DC | PRN
  Administered 2020-01-03: 1000 mL

## 2020-01-03 SURGICAL SUPPLY — 47 items
ADH SKN CLS APL DERMABOND .7 (GAUZE/BANDAGES/DRESSINGS) ×2
APL PRP STRL LF DISP 70% ISPRP (MISCELLANEOUS) ×2
APPLIER CLIP ROT 10 11.4 M/L (STAPLE) ×3
APR CLP MED LRG 11.4X10 (STAPLE) ×2
BAG SPEC RTRVL 10 TROC 200 (ENDOMECHANICALS) ×2
BIOPATCH RED 1 DISK 7.0 (GAUZE/BANDAGES/DRESSINGS) ×2 IMPLANT
BLADE CLIPPER SURG (BLADE) IMPLANT
CANISTER SUCT 3000ML PPV (MISCELLANEOUS) ×3 IMPLANT
CHLORAPREP W/TINT 26 (MISCELLANEOUS) ×3 IMPLANT
CLIP APPLIE ROT 10 11.4 M/L (STAPLE) ×2 IMPLANT
COVER SURGICAL LIGHT HANDLE (MISCELLANEOUS) ×3 IMPLANT
DERMABOND ADVANCED (GAUZE/BANDAGES/DRESSINGS) ×1
DERMABOND ADVANCED .7 DNX12 (GAUZE/BANDAGES/DRESSINGS) ×1 IMPLANT
DRAIN CHANNEL 19F RND (DRAIN) ×2 IMPLANT
DRSG TEGADERM 4X4.75 (GAUZE/BANDAGES/DRESSINGS) ×2 IMPLANT
ELECT REM PT RETURN 9FT ADLT (ELECTROSURGICAL) ×3
ELECTRODE REM PT RTRN 9FT ADLT (ELECTROSURGICAL) ×2 IMPLANT
ENDOLOOP SUT PDS II  0 18 (SUTURE) ×3
ENDOLOOP SUT PDS II 0 18 (SUTURE) ×1 IMPLANT
EVACUATOR SILICONE 100CC (DRAIN) ×2 IMPLANT
GLOVE BIO SURGEON STRL SZ8 (GLOVE) ×3 IMPLANT
GLOVE BIOGEL PI IND STRL 8 (GLOVE) ×2 IMPLANT
GLOVE BIOGEL PI INDICATOR 8 (GLOVE) ×1
GOWN STRL REUS W/ TWL LRG LVL3 (GOWN DISPOSABLE) ×5 IMPLANT
GOWN STRL REUS W/ TWL XL LVL3 (GOWN DISPOSABLE) ×2 IMPLANT
GOWN STRL REUS W/TWL LRG LVL3 (GOWN DISPOSABLE) ×9
GOWN STRL REUS W/TWL XL LVL3 (GOWN DISPOSABLE) ×3
KIT BASIN OR (CUSTOM PROCEDURE TRAY) ×3 IMPLANT
KIT TURNOVER KIT B (KITS) ×3 IMPLANT
NS IRRIG 1000ML POUR BTL (IV SOLUTION) ×3 IMPLANT
PAD ARMBOARD 7.5X6 YLW CONV (MISCELLANEOUS) ×3 IMPLANT
POUCH RETRIEVAL ECOSAC 10 (ENDOMECHANICALS) ×2 IMPLANT
POUCH RETRIEVAL ECOSAC 10MM (ENDOMECHANICALS) ×3
SCISSORS LAP 5X35 DISP (ENDOMECHANICALS) ×3 IMPLANT
SET IRRIG TUBING LAPAROSCOPIC (IRRIGATION / IRRIGATOR) ×3 IMPLANT
SET TUBE SMOKE EVAC HIGH FLOW (TUBING) ×3 IMPLANT
SLEEVE ENDOPATH XCEL 5M (ENDOMECHANICALS) ×3 IMPLANT
SPECIMEN JAR SMALL (MISCELLANEOUS) ×3 IMPLANT
SUT ETHILON 2 0 FS 18 (SUTURE) ×2 IMPLANT
SUT MNCRL AB 4-0 PS2 18 (SUTURE) ×3 IMPLANT
TOWEL GREEN STERILE FF (TOWEL DISPOSABLE) ×3 IMPLANT
TRAY LAPAROSCOPIC MC (CUSTOM PROCEDURE TRAY) ×3 IMPLANT
TROCAR XCEL BLUNT TIP 100MML (ENDOMECHANICALS) ×3 IMPLANT
TROCAR XCEL NON-BLD 11X100MML (ENDOMECHANICALS) ×3 IMPLANT
TROCAR XCEL NON-BLD 5MMX100MML (ENDOMECHANICALS) ×3 IMPLANT
WARMER LAPAROSCOPE (MISCELLANEOUS) ×3 IMPLANT
WATER STERILE IRR 1000ML POUR (IV SOLUTION) ×3 IMPLANT

## 2020-01-03 NOTE — Anesthesia Postprocedure Evaluation (Signed)
Anesthesia Post Note  Patient: Brandy Hicks [name]  Procedure(s) Performed: LAPAROSCOPIC CHOLECYSTECTOMY (N/A Abdomen)     Patient location during evaluation: PACU Anesthesia Type: General Level of consciousness: awake and alert Pain management: pain level controlled Vital Signs Assessment: post-procedure vital signs reviewed and stable Respiratory status: spontaneous breathing, nonlabored ventilation, respiratory function stable and patient connected to nasal cannula oxygen Cardiovascular status: blood pressure returned to baseline and stable Postop Assessment: no apparent nausea or vomiting Anesthetic complications: no   No complications documented.  Last Vitals:  Vitals:   01/03/20 1545 01/03/20 1606  BP:  (!) 147/54  Pulse:  78  Resp:    Temp: 36.6 C 36.9 C  SpO2:  95%    Last Pain:  Vitals:   01/03/20 1845  TempSrc:   PainSc: 9                  Marcellene Shivley DAVID

## 2020-01-03 NOTE — Progress Notes (Signed)
PROGRESS NOTE  Brandy Hicks [name] FTD:[id number] DOB: 1932-07-07 DOA: 01/02/2020 PCP: [name], MD  Brief History   86 year old female with past medical history of hyperlipidemia, hypertension, hypothyroidism, Guillain Barre Syndrome who presents to [facility] emergency department with complaints of chest pain nausea vomiting.  Patient explains that she woke up at approximately 430 this morning with sudden onset of epigastric and chest discomfort.  Patient describes discomfort as sharp in quality, severe intensity and radiating around to her back.  At approximately same time the patient began to develop intense nausea with frequent bouts of nonbilious nonbloody vomiting.  Patient symptoms persisted throughout the morning at approximately 9:30 AM the patient attempted to take something by mouth but this resulted in more vomiting.  Patient denies sick contacts, recent travel, fevers, dysuria, low back pain or diarrhea.  Patient denies cough or shortness of breath.  Due to progressively worsening symptoms the patient eventually presented to [facility] emergency department for evaluation.  Upon evaluation in the emergency department patient was initially evaluated for possible ACS.  Serial cardiac enzymes were unremarkable without dynamic EKG changes noted on EKG.  Patient underwent CT angiogram of the chest which revealed no evidence of pulmonary embolism but did incidentally reveal impacted 6 mm stone in the neck of the gallbladder with gallbladder distention and edema.  Case was discussed with Dr. Dwain Sarna with general surgery who stated that he would be happy to consult with medicine to admit.  2 g of intravenous ceftriaxone were administered.  Patient was also given.  Zofran, morphine and Dilaudid for associated symptoms.  The hospitalist group was then called to assess the patient for admission to the hospital.  The patient was admitted to a med/surg bed. General  surgery evaluated the patient and plans to take her for laparoscopic cholecystectomy later today.  Consultants  . General Surgery  Procedures  . None  Antibiotics   Anti-infectives (From admission, onward)   Start     Dose/Rate Route Frequency Ordered Stop   01/03/20 0300  piperacillin-tazobactam (ZOSYN) IVPB 3.375 g       "Followed by" Linked Group Details   3.375 g 12.5 mL/hr over 240 Minutes Intravenous Every 8 hours 01/02/20 2040     01/02/20 2045  piperacillin-tazobactam (ZOSYN) IVPB 3.375 g       "Followed by" Linked Group Details   3.375 g 100 mL/hr over 30 Minutes Intravenous  Once 01/02/20 2040 01/03/20 0035   01/02/20 1800  cefTRIAXone (ROCEPHIN) 2 g in sodium chloride 0.9 % 100 mL IVPB        2 g 200 mL/hr over 30 Minutes Intravenous  Once 01/02/20 1751 01/02/20 1932     Subjective  The patient is resting comfortably. No new complaints.  Objective   Vitals:  Vitals:   01/03/20 1545 01/03/20 1606  BP:  (!) 147/54  Pulse:  78  Resp:    Temp: 97.8 F (36.6 C) 98.4 F (36.9 C)  SpO2:  95%   Exam:  Constitutional:  . The patient is awake, alert, and oriented x 3. No acute distress. Respiratory:  . No increased work of breathing. . No wheezes, rales, or rhonchi . No tactile fremitus Cardiovascular:  . Regular rate and rhythm . No murmurs, ectopy, or gallups. . No lateral PMI. No thrills. Abdomen:  . Abdomen is soft, somewhat distended, and tender in the epigastrum . No hernias, masses, or organomegaly . Normoactive bowel sounds.  Musculoskeletal:  . No cyanosis,  clubbing, or edema Skin:  . No rashes, lesions, ulcers . palpation of skin: no induration or nodules Neurologic:  . CN 2-12 intact . Sensation all 4 extremities intact Psychiatric:  . Mental status o Mood, affect appropriate o Orientation to person, place, time  . judgment and insight appear intact  I have personally reviewed the following:   Today's Data  . Vitals, CMP,  CBC  Imaging  . CTA chest abdomen and pelvis . Right upper quadrant abdominal ultrasound  Scheduled Meds: . diphenhydrAMINE  25 mg Oral QHS  . fluticasone  2 spray Each Nare Daily  . gabapentin  300 mg Oral BID  . HYDROmorphone      . HYDROmorphone      . loratadine  10 mg Oral Daily  . metoprolol tartrate  2.5 mg Intravenous Q6H  . pantoprazole (PROTONIX) IV  40 mg Intravenous Q24H   Continuous Infusions: . piperacillin-tazobactam (ZOSYN)  IV 0 g (01/03/20 0710)    Principal Problem:   Acute cholecystitis Active Problems:   Essential tremor   Hypomagnesemia   Hyperkalemia   Mixed hyperlipidemia   Hypothyroidism   Chest pain   Lung infiltrate on CT   LOS: 0 days    Acute cholecystitis: The patient is found to have acute cholecystitis by CT abdomen and right upper quadrant ultrasound. She will go to surgery with general surgery later today. She is receiving IV Zosyn with antiemetics and analgesics. She has been NPO after midnight.  Chest pain: Cue to cholecystitis. MIRO. Continue antiemetics and PPI. Pain control as necessary.  Hypomagnesemia: Supplement and monitor. Monitoring patient on telemetry  Hyperkalemia: Supplement and monitor. Monitoring patient on telemetry  Hypothyroidism: Continue synthroid as at home. Restart postoperatively.  Lung infiltrate on CT: Likely related to acute cholecystitis. No clinical evidence of pneumonia. Monitor.  Essential tremor: Continue primidone postoperatively.  Mixed hyperlipidemia: We will restart statin therapy postoperatively  I have seen and examined this patient myself. I have spent 35 minutes in her evaluation and care.  Code Status:  DNR -confirmed with patient at time of admission Family Communication: None available Status is: Observation The patient remains OBS appropriate and will d/c before 2 midnights.  Dispo: The patient is from: ALF  Anticipated d/c is to: ALF  Anticipated  d/c date is: 2 days  Patient currently is not medically stable to d/c.

## 2020-01-03 NOTE — Interval H&P Note (Signed)
History and Physical Interval Note:  01/03/2020 11:26 AM  Brandy Hicks  has presented today for surgery, with the diagnosis of ACUTE CHOLECYSTITIS.  The various methods of treatment have been discussed with the patient and family. After consideration of risks, benefits and other options for treatment, the patient has consented to  Procedure(s): LAPAROSCOPIC CHOLECYSTECTOMY WITH POSSIBLE INTRAOPERATIVE CHOLANGIOGRAM (N/A) as a surgical intervention.  The patient's history has been reviewed, patient examined, no change in status, stable for surgery.  I have reviewed the patient's chart and labs.  Questions were answered to the patient's satisfaction.     Latayna Ritchie A Azalia Neuberger

## 2020-01-03 NOTE — Anesthesia Preprocedure Evaluation (Signed)
Anesthesia Evaluation  Patient identified by MRN, date of birth, ID band Patient awake    Reviewed: Allergy & Precautions, NPO status , Patient's Chart, lab work & pertinent test results  Airway Mallampati: I  TM Distance: >3 FB Neck ROM: Full    Dental   Pulmonary former smoker,    Pulmonary exam normal        Cardiovascular hypertension, Pt. on medications Normal cardiovascular exam     Neuro/Psych Anxiety Depression TIA   GI/Hepatic   Endo/Other    Renal/GU      Musculoskeletal   Abdominal   Peds  Hematology   Anesthesia Other Findings   Reproductive/Obstetrics                             Anesthesia Physical Anesthesia Plan  ASA: II  Anesthesia Plan: General   Post-op Pain Management:    Induction:   PONV Risk Score and Plan: 3 and Ondansetron and Treatment may vary due to age or medical condition  Airway Management Planned: Oral ETT  Additional Equipment:   Intra-op Plan:   Post-operative Plan: Extubation in OR  Informed Consent: I have reviewed the patients History and Physical, chart, labs and discussed the procedure including the risks, benefits and alternatives for the proposed anesthesia with the patient or authorized representative who has indicated his/her understanding and acceptance.       Plan Discussed with: CRNA and Surgeon  Anesthesia Plan Comments:         Anesthesia Quick Evaluation

## 2020-01-03 NOTE — Anesthesia Procedure Notes (Signed)
Procedure Name: Intubation Performed by: Zacariah Belue H, CRNA Pre-anesthesia Checklist: Patient identified, Emergency Drugs available, Suction available and Patient being monitored Patient Re-evaluated:Patient Re-evaluated prior to induction Oxygen Delivery Method: Circle System Utilized Preoxygenation: Pre-oxygenation with 100% oxygen Induction Type: IV induction Ventilation: Mask ventilation without difficulty Laryngoscope Size: Mac and 3 Grade View: Grade I Tube type: Oral Tube size: 7.0 mm Number of attempts: 1 Airway Equipment and Method: Stylet and Oral airway Placement Confirmation: ETT inserted through vocal cords under direct vision,  positive ETCO2 and breath sounds checked- equal and bilateral Secured at: 22 cm Tube secured with: Tape Dental Injury: Teeth and Oropharynx as per pre-operative assessment        

## 2020-01-03 NOTE — Op Note (Addendum)
Laparoscopic Cholecystectomy Procedure Note  Indications: This patient presents with symptomatic gallbladder disease and will undergo laparoscopic cholecystectomy. The procedure has been discussed with the patient. Operative and non operative treatments have been discussed. Risks of surgery include bleeding, infection,  Common bile duct injury,  Injury to the stomach,liver, colon,small intestine, abdominal wall,  Diaphragm,  Major blood vessels,  And the need for an open procedure.  Other risks include worsening of medical problems, death,  DVT and pulmonary embolism, and cardiovascular events.   Medical options have also been discussed. The patient has been informed of long term expectations of surgery and non surgical options,  The patient agrees to proceed.    Pre-operative Diagnosis: Acute cholecystitis  Post-operative Diagnosis: Same  Surgeon: Dortha Schwalbe  MD   Assistants: Dr Erven Colla  MD   Anesthesia: General endotracheal anesthesia and Local anesthesia 0.25.% bupivacaine, with epinephrine  ASA Class: 3  Procedure Details  The patient was seen again in the Holding Room. The risks, benefits, complications, treatment options, and expected outcomes were discussed with the patient. The possibilities of reaction to medication, pulmonary aspiration, perforation of viscus, bleeding, recurrent infection, finding a normal gallbladder, the need for additional procedures, failure to diagnose a condition, the possible need to convert to an open procedure, and creating a complication requiring transfusion or operation were discussed with the patient. The patient and/or family concurred with the proposed plan, giving informed consent. The site of surgery properly noted/marked. The patient was taken to Operating Room, identified as Brandy Hicks and the procedure verified as Laparoscopic Cholecystectomy with Intraoperative Cholangiograms. A Time Out was held and the above information  confirmed.  Prior to the induction of general anesthesia, antibiotic prophylaxis was administered. General endotracheal anesthesia was then administered and tolerated well. After the induction, the abdomen was prepped in the usual sterile fashion. The patient was positioned in the supine position with the left arm comfortably tucked, along with some reverse Trendelenburg.  Local anesthetic agent was injected into the skin near the umbilicus and an incision made. The midline fascia was incised and the Hasson technique was used to introduce a 12 mm port under direct vision. It was secured with a figure of eight Vicryl suture placed in the usual fashion. Pneumoperitoneum was then created with CO2 and tolerated well without any adverse changes in the patient's vital signs. Additional trocars were introduced under direct vision with an 11 mm trocar in the epigastrium and 2 5 mm trocars in the right upper quadrant. All skin incisions were infiltrated with a local anesthetic agent before making the incision and placing the trocars.   The gallbladder was identified, the fundus grasped and retracted cephalad. Adhesions were lysed bluntly and with the electrocautery where indicated, taking care not to injure any adjacent organs or viscus. The infundibulum was grasped and retracted laterally, exposing the peritoneum overlying the triangle of Calot. This was then divided and exposed in a blunt fashion. The cystic duct was clearly identified and bluntly dissected circumferentially. The junctions of the gallbladder, cystic duct and common bile duct were clearly identified prior to the division of any linear structure.  The critical view was obtained and the tissue was very friable therefore a cholangiogram was not performed.   The cystic duct was friable and during the attempt to clip the duct tore.  Endoloop was used to close the cystic  duct without signs of leakage.     The cystic artery was identified, dissected  free, ligated  with clips and divided as well. Posterior cystic artery clipped and divided.  The gallbladder was dissected from the liver bed in retrograde fashion with the electrocautery. The gallbladder was removed. The liver bed was irrigated and inspected. Hemostasis was achieved with the electrocautery. Copious irrigation was utilized and was repeatedly aspirated until clear all particulate matter. Hemostasis was achieved with no signs  of bleeding or bile leakage.  Pneumoperitoneum was completely reduced after viewing removal of the trocars under direct vision. The wound was thoroughly irrigated and the fascia was then closed with a figure of eight suture; the skin was then closed with 4 0 MONOCRYL and a sterile dressing of Dermabond was applied.  Instrument, sponge, and needle counts were correct at closure and at the conclusion of the case.   Findings: Cholecystitis with Cholelithiasis  Estimated Blood Loss: Minimal         Drains: 19 F          Total IV Fluids: PER RECORD          Specimens: Gallbladder           Complications: None; patient tolerated the procedure well.         Disposition: PACU - hemodynamically stable.         Condition: stable

## 2020-01-03 NOTE — Progress Notes (Signed)
[name] Progress Note     Subjective: Patient reports pain in epigastrium and RUQ. No questions regarding surgery. Will need her granddaughter to sign consent for her, she is coming in from [place], [place] this AM.   Objective: Vital signs in last 24 hours: Temp:  [98.3 F (36.8 C)-98.6 F (37 C)] 98.6 F (37 C) (10/19 0750) Pulse Rate:  [57-68] 63 (10/19 0700) Resp:  [15-28] 16 (10/19 0700) BP: (150-186)/(47-117) 173/50 (10/19 0700) SpO2:  [94 %-99 %] 99 % (10/19 0700)    Intake/Output from previous day: 10/18 0701 - 10/19 0700 In: 200 [IV Piggyback:200] Out: -  Intake/Output this shift: Total I/O In: 50 [IV Piggyback:50] Out: -   PE: General: pleasant, WD, elderly female who is laying in bed and appears uncomfortable HEENT:  Sclera are anicteric.  PERRL.  Ears and nose without any masses or lesions.  Mouth is pink and moist Heart: regular, rate, and rhythm.  Normal s1,s2. No obvious murmurs, gallops, or rubs noted.  Palpable radial and pedal pulses bilaterally Lungs: CTAB, no wheezes, rhonchi, or rales noted.  Respiratory effort nonlabored Abd: soft, ttp in epigastrium, ND, +BS, no masses, hernias, or organomegaly MS: all 4 extremities are symmetrical with no cyanosis, clubbing, or edema. Skin: warm and dry with no masses, lesions, or rashes Neuro: Cranial nerves 2-12 grossly intact, tremor present  Psych: A&Ox3 with an appropriate affect.   Lab Results:  Recent Labs    01/02/20 1259 01/03/20 0533  WBC 9.3 13.3*  HGB 13.2 12.5  HCT 39.2 36.8  PLT 260 288   BMET Recent Labs    01/02/20 1519 01/03/20 0533  NA 135 135  K 3.8 3.6  CL 99 100  CO2 23 25  GLUCOSE 154* 126*  BUN 18 15  CREATININE 1.17* 1.03*  CALCIUM 8.9 8.5*   PT/INR Recent Labs    01/03/20 0533  LABPROT 13.4  INR 1.1   CMP     Component Value Date/Time   NA 135 01/03/2020 0533   K 3.6 01/03/2020 0533   CL 100 01/03/2020 0533   CO2 25 01/03/2020 0533   GLUCOSE 126  (H) 01/03/2020 0533   BUN 15 01/03/2020 0533   CREATININE 1.03 (H) 01/03/2020 0533   CALCIUM 8.5 (L) 01/03/2020 0533   PROT 6.5 01/03/2020 0533   ALBUMIN 3.2 (L) 01/03/2020 0533   AST 37 01/03/2020 0533   ALT 28 01/03/2020 0533   ALKPHOS 63 01/03/2020 0533   BILITOT 0.9 01/03/2020 0533   GFRNONAA 50 (L) 01/03/2020 0533   GFRAA 59 (L) 04/20/2019 0432   Lipase     Component Value Date/Time   LIPASE 31 01/02/2020 1519       Studies/Results: DG Chest 2 View  Result Date: 01/02/2020 CLINICAL DATA:  Chest pain. EXAM: CHEST - 2 VIEW COMPARISON:  April 22, 2015. FINDINGS: The heart size and mediastinal contours are within normal limits. Both lungs are clear. No pneumothorax or pleural effusion is noted. The visualized skeletal structures are unremarkable. IMPRESSION: No active cardiopulmonary disease. Aortic Atherosclerosis (ICD10-I70.0). Electronically Signed   By: Lupita Raider M.D.   On: 01/02/2020 13:32   CT Angio Chest/Abd/Pel for Dissection W and/or W/WO  Result Date: 01/02/2020 CLINICAL DATA:  Chest pain, abdominal pain, aortic dissection EXAM: CT ANGIOGRAPHY CHEST, ABDOMEN AND PELVIS TECHNIQUE: Non-contrast CT of the chest was initially obtained. Multidetector CT imaging through the chest, abdomen and pelvis was performed using the standard protocol during bolus administration of intravenous  contrast. Multiplanar reconstructed images and MIPs were obtained and reviewed to evaluate the vascular anatomy. CONTRAST:  10mL OMNIPAQUE IOHEXOL 350 MG/ML SOLN COMPARISON:  None. FINDINGS: CTA CHEST FINDINGS Cardiovascular: The thoracic aorta is normal in caliber. No evidence of intramural hematoma, dissection, or aneurysm. There is extensive atherosclerotic calcification identified within the aortic arch and proximal arch vasculature resulting in a less than 50% stenosis of the left common carotid artery at its origin. Bovine arch anatomy noted. Pulmonary arterial caliber is within normal  limits. Global cardiac size is within normal limits. Moderate atherosclerotic calcification of the coronary arteries. Moderate calcification of the mitral valve annulus. The aortic valve is trileaflet. Mediastinum/Nodes: There is shotty mediastinal adenopathy present without frankly pathologic enlargement noted. Thyroid unremarkable. Esophagus unremarkable. Lungs/Pleura: Scattered nodular infiltrates within the right upper lobe are nonspecific and may be infectious or inflammatory in nature. Scarring noted within the right lung base. No pneumothorax or pleural effusion. Central airways are widely patent. Musculoskeletal: No acute bone abnormality. Review of the MIP images confirms the above findings. CTA ABDOMEN AND PELVIS FINDINGS VASCULAR Aorta: The abdominal aorta is normal in caliber; no evidence of aneurysm or dissection. There is extensive atherosclerotic calcification noted within the abdominal aorta. No evidence of hemodynamically significant stenosis. Celiac: Widely patent.  Conventional anatomy.  No aneurysm. SMA: Widely patent.  Conventional anatomy. Renals: Dual right and single left renal arteries are widely patent. Normal arterial contour. No aneurysm. IMA: Widely patent. Inflow: Widely patent. Internal iliac arteries are patent bilaterally. Veins: Not well opacified. Review of the MIP images confirms the above findings. NON-VASCULAR Hepatobiliary: The gallbladder is distended. A calcified 6 mm gallstone is seen impacted within the gallbladder neck. The gallbladder wall appears mildly edematous and, together, the findings are suggestive of early changes of acute cholecystitis. No significant internal or external biliary ductal dilation. Pancreas: Unremarkable Spleen: Unremarkable Adrenals/Urinary Tract: The adrenal glands are unremarkable. Simple cortical cysts are seen bilaterally. Mild renal cortical atrophy. The kidneys are otherwise unremarkable. Bladder is unremarkable. Stomach/Bowel: The stomach,  small bowel, and large bowel are unremarkable. The appendix is normal. Tiny fat containing umbilical hernia. No free intraperitoneal gas. Tiny bilateral fat containing inguinal hernias. Lymphatic: No pathologic adenopathy within the abdomen and pelvis. Reproductive: Uterus and bilateral adnexa are unremarkable. Other: Rectum unremarkable Musculoskeletal: No acute bone abnormality. Advanced degenerative changes of the lumbosacral junction. Review of the MIP images confirms the above findings. IMPRESSION: No evidence of aortic aneurysm or dissection. Impacted 6 mm gallstone within the a gallbladder neck with gallbladder distension and edematous change suggestive of early acute calculus cholecystitis. Correlation with liver enzymes and physical examination is recommended. Scattered nodular infiltrates within the right upper lobe, nonspecific, possibly infectious or inflammatory in nature. Aortic Atherosclerosis (ICD10-I70.0). Electronically Signed   By: Helyn Numbers MD   On: 01/02/2020 17:33   US Abdomen Limited RUQ (LIVER/GB)  Result Date: 01/02/2020 CLINICAL DATA:  Abdominal pain.  Abnormal CT. EXAM: ULTRASOUND ABDOMEN LIMITED RIGHT UPPER QUADRANT COMPARISON:  CTA chest earlier today.  CT 04/19/2019 FINDINGS: Gallbladder: 8 mm gallstone within the gallbladder neck, non mobile. Sludge within the gallbladder. Gallbladder wall thickening measuring 4 mm. Common bile duct: Diameter: Normal caliber, 6-7 mm. Liver: No focal lesion identified. Within normal limits in parenchymal echogenicity. Portal vein is patent on color Doppler imaging with normal direction of blood flow towards the liver. Other: None. IMPRESSION: 8 mm gallstone lodged within the gallbladder neck. Layering sludge within the gallbladder and gallbladder wall thickening. Appearance is concerning  for possible acute cholecystitis. Electronically Signed   By: Charlett Nose M.D.   On: 01/02/2020 20:09    Anti-infectives: Anti-infectives (From  admission, onward)   Start     Dose/Rate Route Frequency Ordered Stop   01/03/20 0300  piperacillin-tazobactam (ZOSYN) IVPB 3.375 g       "Followed by" Linked Group Details   3.375 g 12.5 mL/hr over 240 Minutes Intravenous Every 8 hours 01/02/20 2040     01/02/20 2045  piperacillin-tazobactam (ZOSYN) IVPB 3.375 g       "Followed by" Linked Group Details   3.375 g 100 mL/hr over 30 Minutes Intravenous  Once 01/02/20 2040 01/03/20 0035   01/02/20 1800  cefTRIAXone (ROCEPHIN) 2 g in sodium chloride 0.9 % 100 mL IVPB        2 g 200 mL/hr over 30 Minutes Intravenous  Once 01/02/20 1751 01/02/20 1932       Assessment/Plan HTN HLD Hypothyroidism Hx of TIA Hx of Guillain Barre Essential tremor Depression  Acute cholecystitis  - US shows stone impacted in GB neck - WBC up this AM - 13.3 from 9.3 - patient with persistent RUQ pain - recommend OR later today for laparoscopic cholecystectomy - I discussed with patient's granddaughter (HCPOA) over the phone who is in agreement and will sign consent for patient when she gets here today   FEN: NPO, IVF VTE: none ID: rocephin 10/18; Zosyn 10/18>>  LOS: 0 days    Juliet Rude , [facility] Surgery 01/03/2020, 8:28 AM Please see Amion for pager number during day hours 7:00am-4:30pm

## 2020-01-03 NOTE — Transfer of Care (Signed)
Immediate Anesthesia Transfer of Care Note  Patient: Brandy Hicks  Procedure(s) Performed: LAPAROSCOPIC CHOLECYSTECTOMY (N/A Abdomen)  Patient Location: PACU  Anesthesia Type:General  Level of Consciousness: drowsy  Airway & Oxygen Therapy: Patient Spontanous Breathing and Patient connected to face mask oxygen  Post-op Assessment: Report given to RN and Post -op Vital signs reviewed and stable  Post vital signs: Reviewed and stable  Last Vitals:  Vitals Value Taken Time  BP    Temp    Pulse    Resp    SpO2      Last Pain:  Vitals:   01/03/20 1110  TempSrc:   PainSc: 2       Patients Stated Pain Goal: 2 (01/03/20 1110)  Complications: No complications documented.

## 2020-01-04 ENCOUNTER — Encounter (HOSPITAL_COMMUNITY): Payer: Self-pay | Admitting: Surgery

## 2020-01-04 LAB — SURGICAL PATHOLOGY

## 2020-01-04 LAB — COMPREHENSIVE METABOLIC PANEL
ALT: 37 U/L (ref 0–44)
AST: 50 U/L — ABNORMAL HIGH (ref 15–41)
Albumin: 2.9 g/dL — ABNORMAL LOW (ref 3.5–5.0)
Alkaline Phosphatase: 45 U/L (ref 38–126)
Anion gap: 11 (ref 5–15)
BUN: 15 mg/dL (ref 8–23)
CO2: 23 mmol/L (ref 22–32)
Calcium: 8 mg/dL — ABNORMAL LOW (ref 8.9–10.3)
Chloride: 101 mmol/L (ref 98–111)
Creatinine, Ser: 1.22 mg/dL — ABNORMAL HIGH (ref 0.44–1.00)
GFR, Estimated: 40 mL/min — ABNORMAL LOW (ref >60)
Glucose, Bld: 145 mg/dL — ABNORMAL HIGH (ref 70–99)
Potassium: 3.3 mmol/L — ABNORMAL LOW (ref 3.5–5.1)
Sodium: 135 mmol/L (ref 135–145)
Total Bilirubin: 0.7 mg/dL (ref 0.3–1.2)
Total Protein: 5.9 g/dL — ABNORMAL LOW (ref 6.5–8.1)

## 2020-01-04 LAB — CBC
HCT: 31.7 % — ABNORMAL LOW (ref 36.0–46.0)
Hemoglobin: 10.6 g/dL — ABNORMAL LOW (ref 12.0–15.0)
MCH: 33 pg (ref 26.0–34.0)
MCHC: 33.4 g/dL (ref 30.0–36.0)
MCV: 98.8 fL (ref 80.0–100.0)
Platelets: 212 10*3/uL (ref 150–400)
RBC: 3.21 MIL/uL — ABNORMAL LOW (ref 3.87–5.11)
RDW: 12.7 % (ref 11.5–15.5)
WBC: 14.6 10*3/uL — ABNORMAL HIGH (ref 4.0–10.5)
nRBC: 0 % (ref 0.0–0.2)

## 2020-01-04 MED ORDER — LEVOTHYROXINE SODIUM 25 MCG PO TABS: 137.0000 ug | ORAL_TABLET | Freq: Every day | ORAL | Status: DC

## 2020-01-04 MED ORDER — POTASSIUM CHLORIDE CRYS ER 20 MEQ PO TBCR: 40.0000 meq | EXTENDED_RELEASE_TABLET | Freq: Two times a day (BID) | ORAL | Status: DC

## 2020-01-04 MED ORDER — OXYCODONE-ACETAMINOPHEN 5-325 MG PO TABS
1.0000 | ORAL_TABLET | ORAL | Status: DC | PRN
  Administered 2020-01-04: 1 via ORAL
  Filled 2020-01-04: qty 1

## 2020-01-04 MED ORDER — LEVOTHYROXINE SODIUM 25 MCG PO TABS
137.0000 ug | ORAL_TABLET | Freq: Every day | ORAL | Status: DC
  Administered 2020-01-05 – 2020-01-07 (×3): 137 ug via ORAL
  Filled 2020-01-04 (×4): qty 1

## 2020-01-04 MED ORDER — SIMETHICONE 80 MG PO CHEW
80.0000 mg | CHEWABLE_TABLET | Freq: Four times a day (QID) | ORAL | Status: DC
  Administered 2020-01-04 – 2020-01-07 (×10): 80 mg via ORAL
  Filled 2020-01-04 (×14): qty 1

## 2020-01-04 MED ORDER — TRAMADOL HCL 50 MG PO TABS
50.0000 mg | ORAL_TABLET | Freq: Four times a day (QID) | ORAL | Status: DC | PRN
  Administered 2020-01-05 – 2020-01-06 (×2): 50 mg via ORAL
  Filled 2020-01-04 (×2): qty 1

## 2020-01-04 MED ORDER — ENOXAPARIN SODIUM 40 MG/0.4ML ~~LOC~~ SOLN
40.0000 mg | SUBCUTANEOUS | Status: DC
  Administered 2020-01-04 – 2020-01-07 (×4): 40 mg via SUBCUTANEOUS
  Filled 2020-01-04 (×4): qty 0.4

## 2020-01-04 MED ORDER — POTASSIUM CHLORIDE IN NACL 20-0.9 MEQ/L-% IV SOLN
INTRAVENOUS | Status: DC
  Filled 2020-01-04 (×4): qty 1000

## 2020-01-04 MED ORDER — POTASSIUM CHLORIDE CRYS ER 20 MEQ PO TBCR
20.0000 meq | EXTENDED_RELEASE_TABLET | Freq: Three times a day (TID) | ORAL | Status: AC
  Administered 2020-01-04 (×3): 20 meq via ORAL
  Filled 2020-01-04 (×3): qty 1

## 2020-01-04 MED ORDER — ASPIRIN EC 81 MG PO TBEC
81.0000 mg | DELAYED_RELEASE_TABLET | Freq: Every day | ORAL | Status: DC
  Administered 2020-01-04 – 2020-01-05 (×2): 81 mg via ORAL
  Filled 2020-01-04 (×2): qty 1

## 2020-01-04 MED ORDER — SERTRALINE HCL 50 MG PO TABS
50.0000 mg | ORAL_TABLET | Freq: Every day | ORAL | Status: DC
  Administered 2020-01-04 – 2020-01-07 (×4): 50 mg via ORAL
  Filled 2020-01-04 (×4): qty 1

## 2020-01-04 MED ORDER — OXYCODONE-ACETAMINOPHEN 5-325 MG PO TABS
1.0000 | ORAL_TABLET | ORAL | Status: DC | PRN
  Administered 2020-01-04 – 2020-01-07 (×8): 1 via ORAL
  Filled 2020-01-04 (×8): qty 1

## 2020-01-04 MED ORDER — PROPRANOLOL HCL ER 60 MG PO CP24
60.0000 mg | ORAL_CAPSULE | Freq: Every day | ORAL | Status: DC
  Administered 2020-01-04 – 2020-01-07 (×4): 60 mg via ORAL
  Filled 2020-01-04 (×4): qty 1

## 2020-01-04 MED ORDER — ATORVASTATIN CALCIUM 40 MG PO TABS
40.0000 mg | ORAL_TABLET | Freq: Every day | ORAL | Status: DC
  Administered 2020-01-04 – 2020-01-06 (×3): 40 mg via ORAL
  Filled 2020-01-04 (×3): qty 1

## 2020-01-04 MED ORDER — WHITE PETROLATUM EX OINT
TOPICAL_OINTMENT | CUTANEOUS | Status: AC
  Administered 2020-01-04: 0.2
  Filled 2020-01-04: qty 28.35

## 2020-01-04 MED ORDER — ACETAMINOPHEN 325 MG PO TABS
650.0000 mg | ORAL_TABLET | Freq: Four times a day (QID) | ORAL | Status: DC
  Administered 2020-01-04 – 2020-01-07 (×9): 650 mg via ORAL
  Filled 2020-01-04 (×9): qty 2

## 2020-01-04 MED ORDER — VITAMIN B-12 1000 MCG PO TABS
1000.0000 ug | ORAL_TABLET | Freq: Every day | ORAL | Status: DC
  Administered 2020-01-04 – 2020-01-05 (×2): 1000 ug via ORAL
  Filled 2020-01-04 (×3): qty 1

## 2020-01-04 MED ORDER — LOSARTAN POTASSIUM 50 MG PO TABS: 100.0000 mg | ORAL_TABLET | Freq: Every day | ORAL | Status: DC

## 2020-01-04 NOTE — Plan of Care (Signed)

## 2020-01-04 NOTE — Progress Notes (Signed)
PROGRESS NOTE    Brandy Hicks  MLY:[id number] DOB: Aug 03, 1932 DOA: 01/02/2020 PCP: [name], MD   Chief Complaint  Patient presents with  . Chest Pain   Brief Narrative: 86 year old female with HLD, HTN, hypothyroidism, GB syndrome history presented to [facility] with chest pain nausea vomiting sudden onset.  In the ED initially concern for ACS cardiac enzymes are unremarkable underwent CT angio no evidence of pulmonary embolism but did incidentally reveal impacted 6 mm stone in the neck of the gallbladder with gallbladder distention and edema.  Seen by surgery and subsequently underwent laparoscopic cholecystectomy 10/19.  Subjective: Had worse pain this am, does not want morphine.  wants meds for her tremors and she is not abel to hold food No flatus or BM, jpo drain + with bloody output  Assessment & Plan:  Acute cholecystitis: Initially presented with nausea vomiting epigastric pain concerning for ACS work-up essentially unremarkable cardiac work-up with troponin EKG.  CT scan revealed signs of acute cholecystitis and was on surgery and underwent laparoscopic cholecystectomy.  Continue soft diet, pain control antibiotics fluid hydration as per surgery.  Continue pain control.  She is currently on Zosyn.  Chest pain due to cholecystitis.  Continue intermittent PPI.  Hypertension/hyperlipidemia cont her home meds. She is on aspirin Lipitor, propanolol.  Monitor H&H and monitor for bleeding.  Essential tremor: Continue home primidone and propanolol.  Lung infiltrates on CT scan: No evidence of pneumonia.  Monitor.  Hypokalemia continue potassium supplementation.  Mild AKI creatinine slightly up 1.22, continue IV hydration.  Leukocytosis likely from #1 she is on IV Zosyn.  Monitor.  Nutrition: Diet Order            DIET SOFT Room service appropriate? Yes; Fluid consistency: Thin  Diet effective now                 DVT prophylaxis: enoxaparin (LOVENOX)  injection 40 mg Start: 01/04/20 1000 SCDs Start: 01/02/20 2035 Code Status:   Code Status: DNR  Family Communication: plan of care discussed with patient at bedside.  Status is: Inpatient Remains inpatient appropriate because:Ongoing postop management from cholecystectomy, monitoring for diet tolerance, return of bowel function  Dispo: The patient is from: Home , [name]              Anticipated d/c is to: Home              Anticipated d/c date is1- 2 days              Patient currently is not medically stable to d/c.  home once cleared by surgery.  Increase activity.  Consultants:see note  Procedures:see note  Culture/Microbiology    Component Value Date/Time   SDES BLOOD SITE NOT SPECIFIED 01/02/2020 2242   SPECREQUEST  01/02/2020 2242    BOTTLES DRAWN AEROBIC AND ANAEROBIC Blood Culture adequate volume   CULT  01/02/2020 2242    NO GROWTH < 12 HOURS Performed at [facility], [address], [place], [place] [address]    REPTSTATUS PENDING 01/02/2020 2242    Other culture-see note  Medications: Scheduled Meds: . aspirin EC  81 mg Oral Daily  . atorvastatin  40 mg Oral q1800  . diphenhydrAMINE  25 mg Oral QHS  . enoxaparin (LOVENOX) injection  40 mg Subcutaneous Q24H  . fluticasone  2 spray Each Nare Daily  . gabapentin  300 mg Oral BID  . levothyroxine  137 mcg Oral Daily  .  loratadine  10 mg Oral Daily  . pantoprazole (PROTONIX) IV  40 mg Intravenous Q24H  . potassium chloride  20 mEq Oral TID  . primidone  200 mg Oral BID  . propranolol ER  60 mg Oral Daily  . sertraline  50 mg Oral Daily  . simethicone  80 mg Oral QID  . vitamin B-12  1,000 mcg Oral Daily   Continuous Infusions: . 0.9 % NaCl with KCl 20 mEq / L    . piperacillin-tazobactam (ZOSYN)  IV 3.375 g (01/04/20 0253)    Antimicrobials: Anti-infectives (From admission, onward)   Start     Dose/Rate Route Frequency Ordered Stop   01/03/20 0300  piperacillin-tazobactam (ZOSYN) IVPB  3.375 g       "Followed by" Linked Group Details   3.375 g 12.5 mL/hr over 240 Minutes Intravenous Every 8 hours 01/02/20 2040     01/02/20 2045  piperacillin-tazobactam (ZOSYN) IVPB 3.375 g       "Followed by" Linked Group Details   3.375 g 100 mL/hr over 30 Minutes Intravenous  Once 01/02/20 2040 01/03/20 0035   01/02/20 1800  cefTRIAXone (ROCEPHIN) 2 g in sodium chloride 0.9 % 100 mL IVPB        2 g 200 mL/hr over 30 Minutes Intravenous  Once 01/02/20 1751 01/02/20 1932     Objective: Vitals: Today's Vitals   01/04/20 0329 01/04/20 0436 01/04/20 0529 01/04/20 0629  BP:  (!) 138/49    Pulse:  82    Resp:  15    Temp:  99 F (37.2 C)    TempSrc:  Oral    SpO2:  95%    PainSc: Asleep  7  Asleep    Intake/Output Summary (Last 24 hours) at 01/04/2020 0954 Last data filed at 01/04/2020 0548 Gross per 24 hour  Intake 1631.34 ml  Output 375 ml  Net 1256.34 ml   There were no vitals filed for this visit. Weight change:   Intake/Output from previous day: 10/19 0701 - 10/20 0700 In: 1681.3 [P.O.:495; I.V.:1000; IV Piggyback:186.3] Out: 375 [Urine:225; Drains:100; Blood:50] Intake/Output this shift: No intake/output data recorded.  Examination: General exam: AAOX3, TREMULUS ,NAD, weak appearing. HEENT:Oral mucosa moist, Ear/Nose WNL grossly,dentition normal. Respiratory system: bilaterally CLEAR,no wheezing or crackles,no use of accessory muscle, non tender. Cardiovascular system: S1 & S2 +, regular, No JVD. Gastrointestinal system: Abdomen soft, NT,ND, BS+. Nervous System:Alert, awake, moving extremities and grossly nonfocal Extremities: No edema, distal peripheral pulses palpable.  Skin: No rashes,no icterus. MSK: Normal muscle bulk,tone, power  Data Reviewed: I have personally reviewed following labs and imaging studies CBC: Recent Labs  Lab 01/02/20 1259 01/03/20 0533 01/04/20 0112  WBC 9.3 13.3* 14.6*  NEUTROABS  --  10.3*  --   HGB 13.2 12.5 10.6*  HCT  39.2 36.8 31.7*  MCV 96.3 97.6 98.8  PLT 260 288 212   Basic Metabolic Panel: Recent Labs  Lab 01/02/20 1259 01/02/20 1519 01/03/20 0533 01/04/20 0112  NA 132* 135 135 135  K 5.7* 3.8 3.6 3.3*  CL 98 99 100 101  CO2 24 23 25 23   GLUCOSE 183* 154* 126* 145*  BUN 17 18 15 15   CREATININE 1.22* 1.17* 1.03* 1.22*  CALCIUM 8.8* 8.9 8.5* 8.0*  MG  --  1.6* 1.9  --    GFR: CrCl cannot be calculated (Unknown ideal weight.). Liver Function Tests: Recent Labs  Lab 01/02/20 1519 01/03/20 0533 01/04/20 0112  AST 32 37 50*  ALT 24 28  37  ALKPHOS 73 63 45  BILITOT 0.5 0.9 0.7  PROT 6.7 6.5 5.9*  ALBUMIN 3.6 3.2* 2.9*   Recent Labs  Lab 01/02/20 1519  LIPASE 31   No results for input(s): AMMONIA in the last 168 hours. Coagulation Profile: Recent Labs  Lab 01/03/20 0533  INR 1.1   Cardiac Enzymes: No results for input(s): CKTOTAL, CKMB, CKMBINDEX, TROPONINI in the last 168 hours. BNP (last 3 results) No results for input(s): PROBNP in the last 8760 hours. HbA1C: No results for input(s): HGBA1C in the last 72 hours. CBG: No results for input(s): GLUCAP in the last 168 hours. Lipid Profile: No results for input(s): CHOL, HDL, LDLCALC, TRIG, CHOLHDL, LDLDIRECT in the last 72 hours. Thyroid Function Tests: No results for input(s): TSH, T4TOTAL, FREET4, T3FREE, THYROIDAB in the last 72 hours. Anemia Panel: No results for input(s): VITAMINB12, FOLATE, FERRITIN, TIBC, IRON, RETICCTPCT in the last 72 hours. Sepsis Labs: Recent Labs  Lab 01/02/20 2258  LATICACIDVEN 1.2    Recent Results (from the past 240 hour(s))  Respiratory Panel by RT PCR (Flu A&B, Covid) - Nasopharyngeal Swab     Status: None   Collection Time: 01/02/20  6:37 PM   Specimen: Nasopharyngeal Swab  Result Value Ref Range Status   SARS Coronavirus 2 by RT PCR NEGATIVE NEGATIVE Final    Comment: (NOTE) SARS-CoV-2 target nucleic acids are NOT DETECTED.  The SARS-CoV-2 RNA is generally detectable in  upper respiratoy specimens during the acute phase of infection. The lowest concentration of SARS-CoV-2 viral copies this assay can detect is 131 copies/mL. A negative result does not preclude SARS-Cov-2 infection and should not be used as the sole basis for treatment or other patient management decisions. A negative result may occur with  improper specimen collection/handling, submission of specimen other than nasopharyngeal swab, presence of viral mutation(s) within the areas targeted by this assay, and inadequate number of viral copies (<131 copies/mL). A negative result must be combined with clinical observations, patient history, and epidemiological information. The expected result is Negative.  Fact Sheet for Patients:  https://www.moore.com/  Fact Sheet for Healthcare Providers:  https://www.young.biz/  This test is no t yet approved or cleared by the [place] FDA and  has been authorized for detection and/or diagnosis of SARS-CoV-2 by FDA under an Emergency Use Authorization (EUA). This EUA will remain  in effect (meaning this test can be used) for the duration of the COVID-19 declaration under Section 564(b)(1) of the Act, 21 U.S.C. section 360bbb-3(b)(1), unless the authorization is terminated or revoked sooner.     Influenza A by PCR NEGATIVE NEGATIVE Final   Influenza B by PCR NEGATIVE NEGATIVE Final    Comment: (NOTE) The Xpert Xpress SARS-CoV-2/FLU/RSV assay is intended as an aid in  the diagnosis of influenza from Nasopharyngeal swab specimens and  should not be used as a sole basis for treatment. Nasal washings and  aspirates are unacceptable for Xpert Xpress SARS-CoV-2/FLU/RSV  testing.  Fact Sheet for Patients: https://www.moore.com/  Fact Sheet for Healthcare Providers: https://www.young.biz/  This test is not yet approved or cleared by the [place] FDA and  has been  authorized for detection and/or diagnosis of SARS-CoV-2 by  FDA under an Emergency Use Authorization (EUA). This EUA will remain  in effect (meaning this test can be used) for the duration of the  Covid-19 declaration under Section 564(b)(1) of the Act, 21  U.S.C. section 360bbb-3(b)(1), unless the authorization is  terminated or revoked. Performed at [facility], [address], [place], [place] [address]   Culture, blood (routine x 2)     Status: None (Preliminary result)   Collection Time: 01/02/20 10:42 PM   Specimen: BLOOD  Result Value Ref Range Status   Specimen Description BLOOD SITE NOT SPECIFIED  Final   Special Requests   Final    BOTTLES DRAWN AEROBIC AND ANAEROBIC Blood Culture adequate volume   Culture   Final    NO GROWTH < 12 HOURS Performed at [facility], [address], [place], [place] [address]    Report Status PENDING  Incomplete     Radiology Studies: DG Chest 2 View  Result Date: 01/02/2020 CLINICAL DATA:  Chest pain. EXAM: CHEST - 2 VIEW COMPARISON:  April 22, 2015. FINDINGS: The heart size and mediastinal contours are within normal limits. Both lungs are clear. No pneumothorax or pleural effusion is noted. The visualized skeletal structures are unremarkable. IMPRESSION: No active cardiopulmonary disease. Aortic Atherosclerosis (ICD10-I70.0). Electronically Signed   By: Lupita Raider M.D.   On: 01/02/2020 13:32   CT Angio Chest/Abd/Pel for Dissection W and/or W/WO  Result Date: 01/02/2020 CLINICAL DATA:  Chest pain, abdominal pain, aortic dissection EXAM: CT ANGIOGRAPHY CHEST, ABDOMEN AND PELVIS TECHNIQUE: Non-contrast CT of the chest was initially obtained. Multidetector CT imaging through the chest, abdomen and pelvis was performed using the standard protocol during bolus administration of intravenous contrast. Multiplanar reconstructed images and MIPs were obtained and reviewed to evaluate the vascular anatomy. CONTRAST:  50mL OMNIPAQUE  IOHEXOL 350 MG/ML SOLN COMPARISON:  None. FINDINGS: CTA CHEST FINDINGS Cardiovascular: The thoracic aorta is normal in caliber. No evidence of intramural hematoma, dissection, or aneurysm. There is extensive atherosclerotic calcification identified within the aortic arch and proximal arch vasculature resulting in a less than 50% stenosis of the left common carotid artery at its origin. Bovine arch anatomy noted. Pulmonary arterial caliber is within normal limits. Global cardiac size is within normal limits. Moderate atherosclerotic calcification of the coronary arteries. Moderate calcification of the mitral valve annulus. The aortic valve is trileaflet. Mediastinum/Nodes: There is shotty mediastinal adenopathy present without frankly pathologic enlargement noted. Thyroid unremarkable. Esophagus unremarkable. Lungs/Pleura: Scattered nodular infiltrates within the right upper lobe are nonspecific and may be infectious or inflammatory in nature. Scarring noted within the right lung base. No pneumothorax or pleural effusion. Central airways are widely patent. Musculoskeletal: No acute bone abnormality. Review of the MIP images confirms the above findings. CTA ABDOMEN AND PELVIS FINDINGS VASCULAR Aorta: The abdominal aorta is normal in caliber; no evidence of aneurysm or dissection. There is extensive atherosclerotic calcification noted within the abdominal aorta. No evidence of hemodynamically significant stenosis. Celiac: Widely patent.  Conventional anatomy.  No aneurysm. SMA: Widely patent.  Conventional anatomy. Renals: Dual right and single left renal arteries are widely patent. Normal arterial contour. No aneurysm. IMA: Widely patent. Inflow: Widely patent. Internal iliac arteries are patent bilaterally. Veins: Not well opacified. Review of the MIP images confirms the above findings. NON-VASCULAR Hepatobiliary: The gallbladder is distended. A calcified 6 mm gallstone is seen impacted within the gallbladder neck.  The gallbladder wall appears mildly edematous and, together, the findings are suggestive of early changes of acute cholecystitis. No significant internal or external biliary ductal dilation. Pancreas: Unremarkable Spleen: Unremarkable Adrenals/Urinary Tract: The adrenal glands are unremarkable. Simple cortical cysts are seen bilaterally. Mild renal cortical atrophy. The kidneys are otherwise unremarkable. Bladder is unremarkable. Stomach/Bowel: The stomach, small bowel, and large bowel are unremarkable.  The appendix is normal. Tiny fat containing umbilical hernia. No free intraperitoneal gas. Tiny bilateral fat containing inguinal hernias. Lymphatic: No pathologic adenopathy within the abdomen and pelvis. Reproductive: Uterus and bilateral adnexa are unremarkable. Other: Rectum unremarkable Musculoskeletal: No acute bone abnormality. Advanced degenerative changes of the lumbosacral junction. Review of the MIP images confirms the above findings. IMPRESSION: No evidence of aortic aneurysm or dissection. Impacted 6 mm gallstone within the a gallbladder neck with gallbladder distension and edematous change suggestive of early acute calculus cholecystitis. Correlation with liver enzymes and physical examination is recommended. Scattered nodular infiltrates within the right upper lobe, nonspecific, possibly infectious or inflammatory in nature. Aortic Atherosclerosis (ICD10-I70.0). Electronically Signed   By: Helyn NumbersAshesh  Parikh MD   On: 01/02/2020 17:33   US Abdomen Limited RUQ (LIVER/GB)  Result Date: 01/02/2020 CLINICAL DATA:  Abdominal pain.  Abnormal CT. EXAM: ULTRASOUND ABDOMEN LIMITED RIGHT UPPER QUADRANT COMPARISON:  CTA chest earlier today.  CT 04/19/2019 FINDINGS: Gallbladder: 8 mm gallstone within the gallbladder neck, non mobile. Sludge within the gallbladder. Gallbladder wall thickening measuring 4 mm. Common bile duct: Diameter: Normal caliber, 6-7 mm. Liver: No focal lesion identified. Within normal limits  in parenchymal echogenicity. Portal vein is patent on color Doppler imaging with normal direction of blood flow towards the liver. Other: None. IMPRESSION: 8 mm gallstone lodged within the gallbladder neck. Layering sludge within the gallbladder and gallbladder wall thickening. Appearance is concerning for possible acute cholecystitis. Electronically Signed   By: Charlett NoseKevin  Dover M.D.   On: 01/02/2020 20:09     LOS: 1 day   Lanae Boastamesh Cyree Chuong, MD [facility]  01/04/2020, 9:54 AM

## 2020-01-04 NOTE — Progress Notes (Signed)
[name] Progress Note  1 Day Post-Op  Subjective: Patient reports severe abdominal pain overnight, somewhat better this AM. Feels like gas, not passing flatus yet. Has not been out of bed yet. Denies nausea. Discussed importance of mobilization this AM and patient in agreement to mobilize today.   Objective: Vital signs in last 24 hours: Temp:  [97.8 F (36.6 C)-99 F (37.2 C)] 99 F (37.2 C) (10/20 0436) Pulse Rate:  [67-83] 82 (10/20 0436) Resp:  [14-20] 15 (10/20 0436) BP: (126-159)/(38-63) 138/49 (10/20 0436) SpO2:  [93 %-99 %] 95 % (10/20 0436) Last BM Date: 01/02/20  Intake/Output from previous day: 10/19 0701 - 10/20 0700 In: 1681.3 [P.O.:495; I.V.:1000; IV Piggyback:186.3] Out: 375 [Urine:225; Drains:100; Blood:50] Intake/Output this shift: No intake/output data recorded.  PE: General: pleasant, WD, elderly female who is laying in bed in NAD HEENT:  Sclera are anicteric.  PERRL.  Ears and nose without any masses or lesions.  Mouth is pink and moist Heart: regular, rate, and rhythm.  Normal s1,s2. No obvious murmurs, gallops, or rubs noted.  Palpable radial and pedal pulses bilaterally Lungs: CTAB, no wheezes, rhonchi, or rales noted.  Respiratory effort nonlabored Abd: soft, appropriately ttp, ND, BS hypoactive, incisions c/d/i, drain with SS fluid MS: all 4 extremities are symmetrical with no cyanosis, clubbing, or edema.    Lab Results:  Recent Labs    01/03/20 0533 01/04/20 0112  WBC 13.3* 14.6*  HGB 12.5 10.6*  HCT 36.8 31.7*  PLT 288 212   BMET Recent Labs    01/03/20 0533 01/04/20 0112  NA 135 135  K 3.6 3.3*  CL 100 101  CO2 25 23  GLUCOSE 126* 145*  BUN 15 15  CREATININE 1.03* 1.22*  CALCIUM 8.5* 8.0*   PT/INR Recent Labs    01/03/20 0533  LABPROT 13.4  INR 1.1   CMP     Component Value Date/Time   NA 135 01/04/2020 0112   K 3.3 (L) 01/04/2020 0112   CL 101 01/04/2020 0112   CO2 23 01/04/2020 0112   GLUCOSE 145  (H) 01/04/2020 0112   BUN 15 01/04/2020 0112   CREATININE 1.22 (H) 01/04/2020 0112   CALCIUM 8.0 (L) 01/04/2020 0112   PROT 5.9 (L) 01/04/2020 0112   ALBUMIN 2.9 (L) 01/04/2020 0112   AST 50 (H) 01/04/2020 0112   ALT 37 01/04/2020 0112   ALKPHOS 45 01/04/2020 0112   BILITOT 0.7 01/04/2020 0112   GFRNONAA 40 (L) 01/04/2020 0112   GFRAA 59 (L) 04/20/2019 0432   Lipase     Component Value Date/Time   LIPASE 31 01/02/2020 1519       Studies/Results: DG Chest 2 View  Result Date: 01/02/2020 CLINICAL DATA:  Chest pain. EXAM: CHEST - 2 VIEW COMPARISON:  April 22, 2015. FINDINGS: The heart size and mediastinal contours are within normal limits. Both lungs are clear. No pneumothorax or pleural effusion is noted. The visualized skeletal structures are unremarkable. IMPRESSION: No active cardiopulmonary disease. Aortic Atherosclerosis (ICD10-I70.0). Electronically Signed   By: Lupita Raider M.D.   On: 01/02/2020 13:32   CT Angio Chest/Abd/Pel for Dissection W and/or W/WO  Result Date: 01/02/2020 CLINICAL DATA:  Chest pain, abdominal pain, aortic dissection EXAM: CT ANGIOGRAPHY CHEST, ABDOMEN AND PELVIS TECHNIQUE: Non-contrast CT of the chest was initially obtained. Multidetector CT imaging through the chest, abdomen and pelvis was performed using the standard protocol during bolus administration of intravenous contrast. Multiplanar reconstructed images and MIPs were obtained and  reviewed to evaluate the vascular anatomy. CONTRAST:  28mL OMNIPAQUE IOHEXOL 350 MG/ML SOLN COMPARISON:  None. FINDINGS: CTA CHEST FINDINGS Cardiovascular: The thoracic aorta is normal in caliber. No evidence of intramural hematoma, dissection, or aneurysm. There is extensive atherosclerotic calcification identified within the aortic arch and proximal arch vasculature resulting in a less than 50% stenosis of the left common carotid artery at its origin. Bovine arch anatomy noted. Pulmonary arterial caliber is within  normal limits. Global cardiac size is within normal limits. Moderate atherosclerotic calcification of the coronary arteries. Moderate calcification of the mitral valve annulus. The aortic valve is trileaflet. Mediastinum/Nodes: There is shotty mediastinal adenopathy present without frankly pathologic enlargement noted. Thyroid unremarkable. Esophagus unremarkable. Lungs/Pleura: Scattered nodular infiltrates within the right upper lobe are nonspecific and may be infectious or inflammatory in nature. Scarring noted within the right lung base. No pneumothorax or pleural effusion. Central airways are widely patent. Musculoskeletal: No acute bone abnormality. Review of the MIP images confirms the above findings. CTA ABDOMEN AND PELVIS FINDINGS VASCULAR Aorta: The abdominal aorta is normal in caliber; no evidence of aneurysm or dissection. There is extensive atherosclerotic calcification noted within the abdominal aorta. No evidence of hemodynamically significant stenosis. Celiac: Widely patent.  Conventional anatomy.  No aneurysm. SMA: Widely patent.  Conventional anatomy. Renals: Dual right and single left renal arteries are widely patent. Normal arterial contour. No aneurysm. IMA: Widely patent. Inflow: Widely patent. Internal iliac arteries are patent bilaterally. Veins: Not well opacified. Review of the MIP images confirms the above findings. NON-VASCULAR Hepatobiliary: The gallbladder is distended. A calcified 6 mm gallstone is seen impacted within the gallbladder neck. The gallbladder wall appears mildly edematous and, together, the findings are suggestive of early changes of acute cholecystitis. No significant internal or external biliary ductal dilation. Pancreas: Unremarkable Spleen: Unremarkable Adrenals/Urinary Tract: The adrenal glands are unremarkable. Simple cortical cysts are seen bilaterally. Mild renal cortical atrophy. The kidneys are otherwise unremarkable. Bladder is unremarkable. Stomach/Bowel: The  stomach, small bowel, and large bowel are unremarkable. The appendix is normal. Tiny fat containing umbilical hernia. No free intraperitoneal gas. Tiny bilateral fat containing inguinal hernias. Lymphatic: No pathologic adenopathy within the abdomen and pelvis. Reproductive: Uterus and bilateral adnexa are unremarkable. Other: Rectum unremarkable Musculoskeletal: No acute bone abnormality. Advanced degenerative changes of the lumbosacral junction. Review of the MIP images confirms the above findings. IMPRESSION: No evidence of aortic aneurysm or dissection. Impacted 6 mm gallstone within the a gallbladder neck with gallbladder distension and edematous change suggestive of early acute calculus cholecystitis. Correlation with liver enzymes and physical examination is recommended. Scattered nodular infiltrates within the right upper lobe, nonspecific, possibly infectious or inflammatory in nature. Aortic Atherosclerosis (ICD10-I70.0). Electronically Signed   By: Helyn Numbers MD   On: 01/02/2020 17:33   US Abdomen Limited RUQ (LIVER/GB)  Result Date: 01/02/2020 CLINICAL DATA:  Abdominal pain.  Abnormal CT. EXAM: ULTRASOUND ABDOMEN LIMITED RIGHT UPPER QUADRANT COMPARISON:  CTA chest earlier today.  CT 04/19/2019 FINDINGS: Gallbladder: 8 mm gallstone within the gallbladder neck, non mobile. Sludge within the gallbladder. Gallbladder wall thickening measuring 4 mm. Common bile duct: Diameter: Normal caliber, 6-7 mm. Liver: No focal lesion identified. Within normal limits in parenchymal echogenicity. Portal vein is patent on color Doppler imaging with normal direction of blood flow towards the liver. Other: None. IMPRESSION: 8 mm gallstone lodged within the gallbladder neck. Layering sludge within the gallbladder and gallbladder wall thickening. Appearance is concerning for possible acute cholecystitis. Electronically Signed   By:  Charlett Nose M.D.   On: 01/02/2020 20:09    Anti-infectives: Anti-infectives (From  admission, onward)   Start     Dose/Rate Route Frequency Ordered Stop   01/03/20 0300  piperacillin-tazobactam (ZOSYN) IVPB 3.375 g       "Followed by" Linked Group Details   3.375 g 12.5 mL/hr over 240 Minutes Intravenous Every 8 hours 01/02/20 2040     01/02/20 2045  piperacillin-tazobactam (ZOSYN) IVPB 3.375 g       "Followed by" Linked Group Details   3.375 g 100 mL/hr over 30 Minutes Intravenous  Once 01/02/20 2040 01/03/20 0035   01/02/20 1800  cefTRIAXone (ROCEPHIN) 2 g in sodium chloride 0.9 % 100 mL IVPB        2 g 200 mL/hr over 30 Minutes Intravenous  Once 01/02/20 1751 01/02/20 1932       Assessment/Plan HTN HLD Hypothyroidism Hx of TIA Hx of Guillain Barre Essential tremor Depression  Acute gangrenous cholecystitis  S/P laparoscopic cholecystectomy 01/03/20 Dr. Luisa Hart - POD#1 - patient needs to mobilize today - schedule simethicone today to help with gas pains - replace K - drain with 100 cc SS fluid - continue  - continue IV abx today, may switch to PO tomorrow if doing well   FEN: HH diet, IVF @ 50 cc/hr VTE: none, lovenox ID: rocephin 10/18; Zosyn 10/18>>  LOS: 1 day    Juliet Rude , [facility] Surgery 01/04/2020, 9:07 AM Please see Amion for pager number during day hours 7:00am-4:30pm

## 2020-01-05 LAB — BASIC METABOLIC PANEL WITH GFR
Anion gap: 7 (ref 5–15)
BUN: 13 mg/dL (ref 8–23)
CO2: 24 mmol/L (ref 22–32)
Calcium: 7.9 mg/dL — ABNORMAL LOW (ref 8.9–10.3)
Chloride: 104 mmol/L (ref 98–111)
Creatinine, Ser: 1.18 mg/dL — ABNORMAL HIGH (ref 0.44–1.00)
GFR, Estimated: 42 mL/min — ABNORMAL LOW (ref >60)
Glucose, Bld: 121 mg/dL — ABNORMAL HIGH (ref 70–99)
Potassium: 3.4 mmol/L — ABNORMAL LOW (ref 3.5–5.1)
Sodium: 135 mmol/L (ref 135–145)

## 2020-01-05 LAB — CBC
HCT: 31.1 % — ABNORMAL LOW (ref 36.0–46.0)
Hemoglobin: 10.4 g/dL — ABNORMAL LOW (ref 12.0–15.0)
MCH: 32.9 pg (ref 26.0–34.0)
MCHC: 33.4 g/dL (ref 30.0–36.0)
MCV: 98.4 fL (ref 80.0–100.0)
Platelets: 207 10*3/uL (ref 150–400)
RBC: 3.16 MIL/uL — ABNORMAL LOW (ref 3.87–5.11)
RDW: 12.7 % (ref 11.5–15.5)
WBC: 12.8 10*3/uL — ABNORMAL HIGH (ref 4.0–10.5)
nRBC: 0 % (ref 0.0–0.2)

## 2020-01-05 MED ORDER — PANTOPRAZOLE SODIUM 40 MG PO TBEC
40.0000 mg | DELAYED_RELEASE_TABLET | Freq: Every day | ORAL | Status: DC
  Administered 2020-01-05: 40 mg via ORAL
  Filled 2020-01-05 (×2): qty 1

## 2020-01-05 MED ORDER — POLYETHYLENE GLYCOL 3350 17 G PO PACK: 17.0000 g | PACK | Freq: Every day | ORAL | Status: DC | PRN

## 2020-01-05 MED ORDER — LIDOCAINE 5 % EX PTCH
1.0000 | MEDICATED_PATCH | CUTANEOUS | Status: DC
  Administered 2020-01-05 – 2020-01-07 (×3): 1 via TRANSDERMAL
  Filled 2020-01-05 (×3): qty 1

## 2020-01-05 MED ORDER — POTASSIUM CHLORIDE CRYS ER 20 MEQ PO TBCR
20.0000 meq | EXTENDED_RELEASE_TABLET | Freq: Three times a day (TID) | ORAL | Status: AC
  Administered 2020-01-05 (×3): 20 meq via ORAL
  Filled 2020-01-05 (×3): qty 1

## 2020-01-05 MED ORDER — ALBUTEROL SULFATE (2.5 MG/3ML) 0.083% IN NEBU
2.5000 mg | INHALATION_SOLUTION | Freq: Once | RESPIRATORY_TRACT | Status: AC
  Administered 2020-01-05: 2.5 mg via RESPIRATORY_TRACT
  Filled 2020-01-05 (×2): qty 3

## 2020-01-05 MED ORDER — METHOCARBAMOL 500 MG PO TABS
500.0000 mg | ORAL_TABLET | Freq: Three times a day (TID) | ORAL | Status: DC
  Administered 2020-01-05 – 2020-01-07 (×6): 500 mg via ORAL
  Filled 2020-01-05 (×6): qty 1

## 2020-01-05 MED ORDER — ALBUTEROL SULFATE (2.5 MG/3ML) 0.083% IN NEBU: 2.5000 mg | INHALATION_SOLUTION | Freq: Four times a day (QID) | RESPIRATORY_TRACT | Status: DC | PRN

## 2020-01-05 MED ORDER — DOCUSATE SODIUM 100 MG PO CAPS
100.0000 mg | ORAL_CAPSULE | Freq: Two times a day (BID) | ORAL | Status: DC
  Administered 2020-01-05 – 2020-01-07 (×5): 100 mg via ORAL
  Filled 2020-01-05 (×5): qty 1

## 2020-01-05 NOTE — Progress Notes (Signed)
Pt states Lidoderm patch to abd has been very effective in relieving her pain.

## 2020-01-05 NOTE — Evaluation (Signed)
Physical Therapy Evaluation Patient Details Name: Brandy Hicks [name] MRN: [id number] DOB: December 04, 1932 Today's Date: 01/05/2020   History of Present Illness  Pt is an 86 y/o female admitted secondary to acute cholecystitis. Pt is s/p laparascopic cholecystectomy. PMH includes HTN and HLD.   Clinical Impression  Pt admitted secondary to problem above with deficits below. Gait limited to within the room as pt with increased pain. Pt requiring min guard A for mobility using RW. Educated about using RW at home to increase safety. Pt's daughter plans to stay with pt at d/c. Will continue to follow acutely to maximize functional mobility independence and safety.     Follow Up Recommendations Home health PT;Supervision for mobility/OOB    Equipment Recommendations  Rolling walker with 5" wheels;Other (comment) (shower chair)    Recommendations for Other Services       Precautions / Restrictions Precautions Precautions: Fall Restrictions Weight Bearing Restrictions: No      Mobility  Bed Mobility Overal bed mobility: Needs Assistance Bed Mobility: Sit to Supine       Sit to supine: Min guard;HOB elevated   General bed mobility comments: Increased time required to come to supine    Transfers Overall transfer level: Needs assistance Equipment used: Rolling walker (2 wheeled) Transfers: Sit to/from Stand Sit to Stand: Min guard         General transfer comment: Min guard for safety.   Ambulation/Gait Ambulation/Gait assistance: Min guard Gait Distance (Feet): 25 Feet Assistive device: Rolling walker (2 wheeled) Gait Pattern/deviations: Step-through pattern;Decreased step length - right;Decreased step length - left Gait velocity: Decreased   General Gait Details: Very slow, cautious gait. Mobility limited to room secondary to pain. Noted SOB; O2 sats at 87% on RA. Sats increased to 94% on 2L.   Stairs            Wheelchair Mobility    Modified Rankin (Stroke  Patients Only)       Balance Overall balance assessment: Needs assistance Sitting-balance support: No upper extremity supported;Feet supported Sitting balance-Leahy Scale: Good     Standing balance support: Bilateral upper extremity supported;No upper extremity supported Standing balance-Leahy Scale: Fair Standing balance comment: Able to staticallly stand without UE support                              Pertinent Vitals/Pain Pain Assessment: Faces Faces Pain Scale: Hurts even more Pain Location: abdomen Pain Descriptors / Indicators: Aching Pain Intervention(s): Limited activity within patient's tolerance;Monitored during session;Repositioned    Home Living Family/patient expects to be discharged to:: Private residence Living Arrangements: Alone Available Help at Discharge: Family;Available 24 hours/day Type of Home: Independent living facility Home Access: Level entry     Home Layout: One level Home Equipment: None      Prior Function Level of Independence: Independent               Hand Dominance        Extremity/Trunk Assessment   Upper Extremity Assessment Upper Extremity Assessment: Defer to OT evaluation    Lower Extremity Assessment Lower Extremity Assessment: Generalized weakness    Cervical / Trunk Assessment Cervical / Trunk Assessment: Normal  Communication   Communication: No difficulties  Cognition Arousal/Alertness: Awake/alert Behavior During Therapy: WFL for tasks assessed/performed Overall Cognitive Status: Within Functional Limits for tasks assessed  General Comments General comments (skin integrity, edema, etc.): Pt's daughter present throughout.     Exercises     Assessment/Plan    PT Assessment Patient needs continued PT services  PT Problem List Decreased strength;Decreased balance;Decreased mobility;Decreased activity tolerance;Decreased knowledge of use  of DME;Decreased knowledge of precautions;Pain       PT Treatment Interventions DME instruction;Gait training;Therapeutic activities;Functional mobility training;Therapeutic exercise;Balance training;Patient/family education    PT Goals (Current goals can be found in the Care Plan section)  Acute Rehab PT Goals Patient Stated Goal: to go home PT Goal Formulation: With patient Time For Goal Achievement: 01/19/20 Potential to Achieve Goals: Good    Frequency Min 3X/week   Barriers to discharge        Co-evaluation               AM-PAC PT "6 Clicks" Mobility  Outcome Measure Help needed turning from your back to your side while in a flat bed without using bedrails?: A Little Help needed moving from lying on your back to sitting on the side of a flat bed without using bedrails?: A Little Help needed moving to and from a bed to a chair (including a wheelchair)?: A Little Help needed standing up from a chair using your arms (e.g., wheelchair or bedside chair)?: A Little Help needed to walk in hospital room?: A Little Help needed climbing 3-5 steps with a railing? : A Lot 6 Click Score: 17    End of Session Equipment Utilized During Treatment: Gait belt Activity Tolerance: Patient limited by pain Patient left: in bed;with call bell/phone within reach;with family/visitor present Nurse Communication: Mobility status PT Visit Diagnosis: Other abnormalities of gait and mobility (R26.89);Difficulty in walking, not elsewhere classified (R26.2);Pain Pain - part of body:  (abdomen)    Time: 1351-1416 PT Time Calculation (min) (ACUTE ONLY): 25 min   Charges:   PT Evaluation $PT Eval Low Complexity: 1 Low PT Treatments $Gait Training: 8-22 mins        Cindee Salt, DPT  Acute Rehabilitation Services  Pager: [phone] Office: [phone]   Lehman Prom 01/05/2020, 2:26 PM

## 2020-01-05 NOTE — Progress Notes (Signed)
[name] Progress Note  2 Days Post-Op  Subjective: Ongoing abdominal pain described as a combination of post-op pain (worse when she moves) and cramping gas pain. Still reports minimal flatus, denies BM. Has not been able to mobilize due to pain. Currently on supplemental O2, doesn't wear O2 at home. Has a walker at home from when she had GBS but no other assistive equipment.  Objective: Vital signs in last 24 hours: Temp:  [97.6 F (36.4 C)-98.2 F (36.8 C)] 98 F (36.7 C) (10/21 0436) Pulse Rate:  [61-97] 67 (10/21 0917) Resp:  [16-21] 21 (10/21 0917) BP: (113-163)/(41-67) 163/63 (10/21 0917) SpO2:  [86 %-98 %] 98 % (10/21 0917) Last BM Date: 01/02/20  Intake/Output from previous day: 10/20 0701 - 10/21 0700 In: 50 [P.O.:50] Out: 45 [Drains:45] Intake/Output this shift: No intake/output data recorded.  PE: General: pleasant, WD, elderly female who is laying in bed, appears uncomfortable, only able to speak in short sentences due to pain HEENT:  Sclera are anicteric Lungs:bilateral wheezes, diminished breath sound bilateral bases, on 3 L [place], Respiratory effort slightly labored Abd: soft, appropriately ttp, some distention, BS hypoactive, incisions c/d/i, drain with SS fluid MS: all 4 extremities are symmetrical with no cyanosis, clubbing, or edema.  Lab Results:  Recent Labs    01/04/20 0112 01/05/20 0232  WBC 14.6* 12.8*  HGB 10.6* 10.4*  HCT 31.7* 31.1*  PLT 212 207   BMET Recent Labs    01/04/20 0112 01/05/20 0232  NA 135 135  K 3.3* 3.4*  CL 101 104  CO2 23 24  GLUCOSE 145* 121*  BUN 15 13  CREATININE 1.22* 1.18*  CALCIUM 8.0* 7.9*   PT/INR Recent Labs    01/03/20 0533  LABPROT 13.4  INR 1.1   CMP     Component Value Date/Time   NA 135 01/05/2020 0232   K 3.4 (L) 01/05/2020 0232   CL 104 01/05/2020 0232   CO2 24 01/05/2020 0232   GLUCOSE 121 (H) 01/05/2020 0232   BUN 13 01/05/2020 0232   CREATININE 1.18 (H) 01/05/2020 0232    CALCIUM 7.9 (L) 01/05/2020 0232   PROT 5.9 (L) 01/04/2020 0112   ALBUMIN 2.9 (L) 01/04/2020 0112   AST 50 (H) 01/04/2020 0112   ALT 37 01/04/2020 0112   ALKPHOS 45 01/04/2020 0112   BILITOT 0.7 01/04/2020 0112   GFRNONAA 42 (L) 01/05/2020 0232   GFRAA 59 (L) 04/20/2019 0432   Lipase     Component Value Date/Time   LIPASE 31 01/02/2020 1519       Studies/Results: No results found.  Anti-infectives: Anti-infectives (From admission, onward)   Start     Dose/Rate Route Frequency Ordered Stop   01/03/20 0300  piperacillin-tazobactam (ZOSYN) IVPB 3.375 g       "Followed by" Linked Group Details   3.375 g 12.5 mL/hr over 240 Minutes Intravenous Every 8 hours 01/02/20 2040     01/02/20 2045  piperacillin-tazobactam (ZOSYN) IVPB 3.375 g       "Followed by" Linked Group Details   3.375 g 100 mL/hr over 30 Minutes Intravenous  Once 01/02/20 2040 01/03/20 0035   01/02/20 1800  cefTRIAXone (ROCEPHIN) 2 g in sodium chloride 0.9 % 100 mL IVPB        2 g 200 mL/hr over 30 Minutes Intravenous  Once 01/02/20 1751 01/02/20 1932       Assessment/Plan HTN HLD Hypothyroidism Hx of TIA Hx of Guillain Barre Essential tremor Depression  Acute gangrenous  cholecystitis  S/P laparoscopic cholecystectomy 01/03/20 Dr. Luisa Hart - POD#2 - add lidoderm patch and scheduled robaxin for pain - replace K - drain with 45 cc SS fluid - continue  - continue IV abx today, WBC 12 from 14 - albuterol nebs ordered for wheezing - PT ordered to mobilize patient and make recs for assistive devices/home health needs  FEN: HH diet, IVF @ 50 cc/hr given poor PO intake and abdominal distention, suspect mild ileus which is not unexpected given gangrenous cholecystitis VTE: none, lovenox ID: rocephin 10/18; Zosyn 10/18>>   LOS: 2 days    Adam Phenix , [facility] Surgery 01/05/2020, 9:25 AM Please see Amion for pager number during day hours 7:00am-4:30pm

## 2020-01-05 NOTE — Progress Notes (Signed)
PROGRESS NOTE    Brandy Hicks  ZTI:[id number] DOB: [birth year] DOA: 01/02/2020 PCP: [name], MD   Chief Complaint  Patient presents with  . Chest Pain   Brief Narrative: 86 year old female with HLD, HTN, hypothyroidism, GB syndrome history presented to [facility] with chest pain nausea vomiting sudden onset.  In the ED initially concern for ACS cardiac enzymes are unremarkable underwent CT angio no evidence of pulmonary embolism but did incidentally reveal impacted 6 mm stone in the neck of the gallbladder with gallbladder distention and edema.  Seen by surgery and subsequently underwent laparoscopic cholecystectomy 10/19. Postop diet slowly advanced, awaiting for return of GI function.  Subjective: Patient complains of a lot of pain feels much better after lidocaine patch. Passing "a little bit" of flatus no bowel movement yet. Overnight afebrile.  Leukocytosis improving.  Potassium low.  jpo drain + with bloody output  Assessment & Plan:  Acute gangrenous cholecystitis: Initially presented with nausea vomiting epigastric pain concerning for ACS work-up essentially unremarkable cardiac work-up with troponin EKG.  CT scan revealed impacted 6 mm stone in the neck of gallbladder with gallbladder distention, seen by [facility],underwent laparoscopic cholecystectomy.  Continue diet as per surgery, on Zosyn-continue per surgery.  Continue pain control, lidocaine patch, muscle relaxants, opiates.  Mobilize as tolerated.  Adding IV fluids due to poor intake.  Continue drain management.  Abdominal distention suspect mild ileus postop.  Added IV fluid hydration.  Continue plan as per surgery.  Chest pain due to cholecystitis.  Continue intermittent PPI.  Troponin negative.  Hypertension/hyperlipidemia cont her home meds. She is on aspirin Lipitor, propanolol.  Monitor H&H and monitor for bleeding.  Essential tremor: Continue home primidone and propanolol.  Lung infiltrates on CT  scan: No evidence of pneumonia.  Monitor.  Hypokalemia-being repleted aggressively.   Mild AKI creatinine is downtrending.  Continue and encourage hydration   Leukocytosis likely from #1, improving, she is on IV Zosyn.  Monitor.  Nutrition: Diet Order            DIET SOFT Room service appropriate? Yes; Fluid consistency: Thin  Diet effective now                 DVT prophylaxis: enoxaparin (LOVENOX) injection 40 mg Start: 01/04/20 1000 SCDs Start: 01/02/20 2035 Code Status:   Code Status: DNR  Family Communication: plan of care discussed with patient at bedside.  Status is: Inpatient Remains inpatient appropriate because:Ongoing postop management from cholecystectomy, monitoring for diet tolerance, return of bowel function  Dispo: The patient is from: Home , [name]              Anticipated d/c is to: Home              Anticipated d/c date is1- 2 days              Patient currently is not medically stable to d/c.  home once cleared by surgery.  Increase activity.  Consultants:see note  Procedures:see note  Culture/Microbiology    Component Value Date/Time   SDES BLOOD LEFT HAND 01/04/2020 0112   SPECREQUEST  01/04/2020 0112    BOTTLES DRAWN AEROBIC ONLY Blood Culture adequate volume   CULT  01/04/2020 0112    NO GROWTH < 12 HOURS Performed at [facility], [address] [place], [place] [address]    REPTSTATUS PENDING 01/04/2020 0112    Other culture-see note  Medications: Scheduled Meds: . acetaminophen  650  mg Oral Q6H  . albuterol  2.5 mg Nebulization Once  . aspirin EC  81 mg Oral Daily  . atorvastatin  40 mg Oral q1800  . diphenhydrAMINE  25 mg Oral QHS  . docusate sodium  100 mg Oral BID  . enoxaparin (LOVENOX) injection  40 mg Subcutaneous Q24H  . fluticasone  2 spray Each Nare Daily  . gabapentin  300 mg Oral BID  . levothyroxine  137 mcg Oral Q0600  . lidocaine  1 patch Transdermal Q24H  . loratadine  10 mg Oral Daily  .  methocarbamol  500 mg Oral TID  . pantoprazole (PROTONIX) IV  40 mg Intravenous Q24H  . potassium chloride  20 mEq Oral TID  . primidone  200 mg Oral BID  . propranolol ER  60 mg Oral Daily  . sertraline  50 mg Oral Daily  . simethicone  80 mg Oral QID  . vitamin B-12  1,000 mcg Oral Daily   Continuous Infusions: . 0.9 % NaCl with KCl 20 mEq / L 50 mL/hr at 01/04/20 1056  . piperacillin-tazobactam (ZOSYN)  IV 3.375 g (01/05/20 0239)    Antimicrobials: Anti-infectives (From admission, onward)   Start     Dose/Rate Route Frequency Ordered Stop   01/03/20 0300  piperacillin-tazobactam (ZOSYN) IVPB 3.375 g       "Followed by" Linked Group Details   3.375 g 12.5 mL/hr over 240 Minutes Intravenous Every 8 hours 01/02/20 2040     01/02/20 2045  piperacillin-tazobactam (ZOSYN) IVPB 3.375 g       "Followed by" Linked Group Details   3.375 g 100 mL/hr over 30 Minutes Intravenous  Once 01/02/20 2040 01/03/20 0035   01/02/20 1800  cefTRIAXone (ROCEPHIN) 2 g in sodium chloride 0.9 % 100 mL IVPB        2 g 200 mL/hr over 30 Minutes Intravenous  Once 01/02/20 1751 01/02/20 1932     Objective: Vitals: Today's Vitals   01/05/20 0410 01/05/20 0430 01/05/20 0436 01/05/20 0526  BP:   (!) 140/55   Pulse:   65   Resp:   16   Temp:   98 F (36.7 C)   TempSrc:   Oral   SpO2:   95%   PainSc: Asleep 7   Asleep    Intake/Output Summary (Last 24 hours) at 01/05/2020 5364 Last data filed at 01/05/2020 0400 Gross per 24 hour  Intake 50 ml  Output 45 ml  Net 5 ml   There were no vitals filed for this visit. Weight change:   Intake/Output from previous day: 10/20 0701 - 10/21 0700 In: 50 [P.O.:50] Out: 45 [Drains:45] Intake/Output this shift: No intake/output data recorded.  Examination:  General exam: AAOX3, ELDERLY, NAD, weak appearing. HEENT:Oral mucosa moist, Ear/Nose WNL grossly, dentition normal. Respiratory system: bilaterally clear,no wheezing or crackles,no use of accessory  muscle Cardiovascular system: S1 & S2 +, No JVD,. Gastrointestinal system: Abdomen soft, tender mostly in right upper quadrant, bowel sounds sluggish, mildly distended Nervous System:Alert, awake, moving extremities and grossly nonfocal Extremities: No edema, distal peripheral pulses palpable.  Skin: No rashes,no icterus. MSK: Normal muscle bulk,tone, power  Data Reviewed: I have personally reviewed following labs and imaging studies CBC: Recent Labs  Lab 01/02/20 1259 01/03/20 0533 01/04/20 0112 01/05/20 0232  WBC 9.3 13.3* 14.6* 12.8*  NEUTROABS  --  10.3*  --   --   HGB 13.2 12.5 10.6* 10.4*  HCT 39.2 36.8 31.7* 31.1*  MCV 96.3 97.6 98.8  98.4  PLT 260 288 212 207   Basic Metabolic Panel: Recent Labs  Lab 01/02/20 1259 01/02/20 1519 01/03/20 0533 01/04/20 0112 01/05/20 0232  NA 132* 135 135 135 135  K 5.7* 3.8 3.6 3.3* 3.4*  CL 98 99 100 101 104  CO2 24 23 25 23 24   GLUCOSE 183* 154* 126* 145* 121*  BUN 17 18 15 15 13   CREATININE 1.22* 1.17* 1.03* 1.22* 1.18*  CALCIUM 8.8* 8.9 8.5* 8.0* 7.9*  MG  --  1.6* 1.9  --   --    GFR: CrCl cannot be calculated (Unknown ideal weight.). Liver Function Tests: Recent Labs  Lab 01/02/20 1519 01/03/20 0533 01/04/20 0112  AST 32 37 50*  ALT 24 28 37  ALKPHOS 73 63 45  BILITOT 0.5 0.9 0.7  PROT 6.7 6.5 5.9*  ALBUMIN 3.6 3.2* 2.9*   Recent Labs  Lab 01/02/20 1519  LIPASE 31   No results for input(s): AMMONIA in the last 168 hours. Coagulation Profile: Recent Labs  Lab 01/03/20 0533  INR 1.1   Cardiac Enzymes: No results for input(s): CKTOTAL, CKMB, CKMBINDEX, TROPONINI in the last 168 hours. BNP (last 3 results) No results for input(s): PROBNP in the last 8760 hours. HbA1C: No results for input(s): HGBA1C in the last 72 hours. CBG: No results for input(s): GLUCAP in the last 168 hours. Lipid Profile: No results for input(s): CHOL, HDL, LDLCALC, TRIG, CHOLHDL, LDLDIRECT in the last 72 hours. Thyroid  Function Tests: No results for input(s): TSH, T4TOTAL, FREET4, T3FREE, THYROIDAB in the last 72 hours. Anemia Panel: No results for input(s): VITAMINB12, FOLATE, FERRITIN, TIBC, IRON, RETICCTPCT in the last 72 hours. Sepsis Labs: Recent Labs  Lab 01/02/20 2258  LATICACIDVEN 1.2    Recent Results (from the past 240 hour(s))  Respiratory Panel by RT PCR (Flu A&B, Covid) - Nasopharyngeal Swab     Status: None   Collection Time: 01/02/20  6:37 PM   Specimen: Nasopharyngeal Swab  Result Value Ref Range Status   SARS Coronavirus 2 by RT PCR NEGATIVE NEGATIVE Final    Comment: (NOTE) SARS-CoV-2 target nucleic acids are NOT DETECTED.  The SARS-CoV-2 RNA is generally detectable in upper respiratoy specimens during the acute phase of infection. The lowest concentration of SARS-CoV-2 viral copies this assay can detect is 131 copies/mL. A negative result does not preclude SARS-Cov-2 infection and should not be used as the sole basis for treatment or other patient management decisions. A negative result may occur with  improper specimen collection/handling, submission of specimen other than nasopharyngeal swab, presence of viral mutation(s) within the areas targeted by this assay, and inadequate number of viral copies (<131 copies/mL). A negative result must be combined with clinical observations, patient history, and epidemiological information. The expected result is Negative.  Fact Sheet for Patients:  https://www.moore.com/https://www.fda.gov/media/142436/download  Fact Sheet for Healthcare Providers:  https://www.young.biz/https://www.fda.gov/media/142435/download  This test is no t yet approved or cleared by the [place] FDA and  has been authorized for detection and/or diagnosis of SARS-CoV-2 by FDA under an Emergency Use Authorization (EUA). This EUA will remain  in effect (meaning this test can be used) for the duration of the COVID-19 declaration under Section 564(b)(1) of the Act, 21 U.S.C. section  360bbb-3(b)(1), unless the authorization is terminated or revoked sooner.     Influenza A by PCR NEGATIVE NEGATIVE Final   Influenza B by PCR NEGATIVE NEGATIVE Final    Comment: (NOTE) The Xpert Xpress SARS-CoV-2/FLU/RSV assay is intended as an  aid in  the diagnosis of influenza from Nasopharyngeal swab specimens and  should not be used as a sole basis for treatment. Nasal washings and  aspirates are unacceptable for Xpert Xpress SARS-CoV-2/FLU/RSV  testing.  Fact Sheet for Patients: https://www.moore.com/  Fact Sheet for Healthcare Providers: https://www.young.biz/  This test is not yet approved or cleared by the [place] FDA and  has been authorized for detection and/or diagnosis of SARS-CoV-2 by  FDA under an Emergency Use Authorization (EUA). This EUA will remain  in effect (meaning this test can be used) for the duration of the  Covid-19 declaration under Section 564(b)(1) of the Act, 21  U.S.C. section 360bbb-3(b)(1), unless the authorization is  terminated or revoked. Performed at [facility], [address] [place], [place] [address]   Culture, blood (routine x 2)     Status: None (Preliminary result)   Collection Time: 01/02/20 10:42 PM   Specimen: BLOOD  Result Value Ref Range Status   Specimen Description BLOOD SITE NOT SPECIFIED  Final   Special Requests   Final    BOTTLES DRAWN AEROBIC AND ANAEROBIC Blood Culture adequate volume   Culture   Final    NO GROWTH 2 DAYS Performed at [facility], [address] [place], [place] [address]    Report Status PENDING  Incomplete  Culture, blood (routine x 2)     Status: None (Preliminary result)   Collection Time: 01/04/20  1:12 AM   Specimen: BLOOD LEFT HAND  Result Value Ref Range Status   Specimen Description BLOOD LEFT HAND  Final   Special Requests   Final    BOTTLES DRAWN AEROBIC ONLY Blood Culture adequate volume   Culture   Final    NO GROWTH < 12  HOURS Performed at [facility], [address] [place], [place] [address]    Report Status PENDING  Incomplete     Radiology Studies: No results found.   LOS: 2 days   Lanae Boast, MD [facility]  01/05/2020, 8:33 AM

## 2020-01-06 ENCOUNTER — Inpatient Hospital Stay (HOSPITAL_COMMUNITY): Payer: Medicare Other

## 2020-01-06 LAB — BASIC METABOLIC PANEL
Anion gap: 8 (ref 5–15)
BUN: 14 mg/dL (ref 8–23)
CO2: 20 mmol/L — ABNORMAL LOW (ref 22–32)
Calcium: 8.2 mg/dL — ABNORMAL LOW (ref 8.9–10.3)
Chloride: 107 mmol/L (ref 98–111)
Creatinine, Ser: 1.19 mg/dL — ABNORMAL HIGH (ref 0.44–1.00)
GFR, Estimated: 45 mL/min — ABNORMAL LOW (ref >60)
Glucose, Bld: 106 mg/dL — ABNORMAL HIGH (ref 70–99)
Potassium: 3.6 mmol/L (ref 3.5–5.1)
Sodium: 135 mmol/L (ref 135–145)

## 2020-01-06 LAB — CBC
HCT: 33 % — ABNORMAL LOW (ref 36.0–46.0)
Hemoglobin: 10.8 g/dL — ABNORMAL LOW (ref 12.0–15.0)
MCH: 32.2 pg (ref 26.0–34.0)
MCHC: 32.7 g/dL (ref 30.0–36.0)
MCV: 98.5 fL (ref 80.0–100.0)
Platelets: 263 10*3/uL (ref 150–400)
RBC: 3.35 MIL/uL — ABNORMAL LOW (ref 3.87–5.11)
RDW: 12.6 % (ref 11.5–15.5)
WBC: 12.5 10*3/uL — ABNORMAL HIGH (ref 4.0–10.5)
nRBC: 0 % (ref 0.0–0.2)

## 2020-01-06 MED ORDER — TECHNETIUM TC 99M MEBROFENIN IV KIT
4.9000 | PACK | Freq: Once | INTRAVENOUS | Status: AC | PRN
  Administered 2020-01-06: 4.9 via INTRAVENOUS

## 2020-01-06 MED ORDER — POTASSIUM CHLORIDE CRYS ER 20 MEQ PO TBCR
40.0000 meq | EXTENDED_RELEASE_TABLET | Freq: Once | ORAL | Status: AC
  Administered 2020-01-06: 40 meq via ORAL
  Filled 2020-01-06: qty 2

## 2020-01-06 NOTE — Progress Notes (Signed)
JP drain has a total output of (thin,serosanguinous) since 7pm. Dr Freida Busman aware.

## 2020-01-06 NOTE — Progress Notes (Signed)
Physical Therapy Treatment Patient Details Name: Brandy Hicks [name] MRN: [id number] DOB: [birth year] Today's Date: 01/06/2020    History of Present Illness Pt is an 86 y/o female admitted secondary to acute cholecystitis. Pt is s/p laparascopic cholecystectomy. PMH includes HTN and HLD.     PT Comments    Pt making slow progress toward functional mobility goals, limited due to moderate/severe pain. Pt requires increased assist with bed mobility and greatly increased time to initiate/perform functional mobility tasks due to discomfort. Pt may benefit from abdominal binder for increased comfort in future sessions (RN notified). Pt progressed gait distance to 1ft using RW in room, remained reliant on 2L O2 [place] during mobility tasks to maintain SpO2 >92%, encouraged use of IS hourly. Pt continues to benefit from PT services to progress toward functional mobility goals. D/C recs below remain appropriate.  Follow Up Recommendations  Home health PT;Supervision for mobility/OOB     Equipment Recommendations  Rolling walker with 5" wheels;Other (comment)    Recommendations for Other Services       Precautions / Restrictions Precautions Precautions: Fall Required Braces or Orthoses:  (none required, msgd RN to ask abdominal binder for comfort) Restrictions Weight Bearing Restrictions: No    Mobility  Bed Mobility Overal bed mobility: Needs Assistance Bed Mobility: Supine to Sit;Sit to Supine     Supine to sit: Mod assist;HOB elevated Sit to supine: Mod assist;HOB elevated   General bed mobility comments: cues for log rolling and demo/teachback; heavy use of bed features  Transfers Overall transfer level: Needs assistance Equipment used: Rolling walker (2 wheeled) Transfers: Sit to/from [employer] Sit to Stand: Min guard Stand pivot transfers: Min guard       General transfer comment: Min guard for safety.   Ambulation/Gait Ambulation/Gait assistance: Min  guard Gait Distance (Feet): 40 Feet Assistive device: Rolling walker (2 wheeled) Gait Pattern/deviations: Step-through pattern;Decreased step length - right;Decreased step length - left Gait velocity: Decreased Gait velocity interpretation: <1.31 ft/sec, indicative of household ambulator General Gait Details: Very slow, cautious gait. Mobility limited to room secondary to pain. Noted SOB; O2 sats at 87% on RA. Sats increased to 94% on 2L.    Stairs             Wheelchair Mobility    Modified Rankin (Stroke Patients Only)       Balance Overall balance assessment: Needs assistance Sitting-balance support: No upper extremity supported;Feet supported Sitting balance-Leahy Scale: Good     Standing balance support: Bilateral upper extremity supported;No upper extremity supported Standing balance-Leahy Scale: Fair Standing balance comment: Able to static stand without UE support but needs BUE support for dynamic tasks                            Cognition Arousal/Alertness: Awake/alert Behavior During Therapy: WFL for tasks assessed/performed Overall Cognitive Status: Within Functional Limits for tasks assessed                                        Exercises      General Comments        Pertinent Vitals/Pain Pain Assessment: Faces Faces Pain Scale: Hurts even more Pain Location: abdomen acute and chronic R shoulder/collarbone pain Pain Descriptors / Indicators: Aching Pain Intervention(s): Monitored during session;Repositioned (encouraged log rolling)  Pt HR 100-105 bpm during mobility tasks and  SpO2 92-94% on 2L O2 [place]  Home Living                      Prior Function            PT Goals (current goals can now be found in the care plan section) Acute Rehab PT Goals Patient Stated Goal: to go home PT Goal Formulation: With patient Time For Goal Achievement: 01/19/20 Potential to Achieve Goals: Good Progress towards PT  goals: Progressing toward goals    Frequency    Min 3X/week      PT Plan Current plan remains appropriate    Co-evaluation              AM-PAC PT "6 Clicks" Mobility   Outcome Measure  Help needed turning from your back to your side while in a flat bed without using bedrails?: A Little Help needed moving from lying on your back to sitting on the side of a flat bed without using bedrails?: A Lot Help needed moving to and from a bed to a chair (including a wheelchair)?: A Little Help needed standing up from a chair using your arms (e.g., wheelchair or bedside chair)?: A Little Help needed to walk in hospital room?: A Little Help needed climbing 3-5 steps with a railing? : A Lot 6 Click Score: 16    End of Session   Activity Tolerance: Patient limited by pain Patient left: in bed;with call bell/phone within reach;with family/visitor present;with SCD's reapplied;with bed alarm set (heels floated) Nurse Communication: Mobility status PT Visit Diagnosis: Other abnormalities of gait and mobility (R26.89);Difficulty in walking, not elsewhere classified (R26.2);Pain     Time: 4401-0272 PT Time Calculation (min) (ACUTE ONLY): 28 min  Charges:  $Gait Training: 8-22 mins $Therapeutic Activity: 8-22 mins                     Trayon Krantz P., PTA Acute Rehabilitation Services Pager: [phone] Office: [phone]   [place] 01/06/2020, 3:39 PM

## 2020-01-06 NOTE — TOC Initial Note (Addendum)
Transition of Care [facility]) - Initial/Assessment Note    Patient Details  Name: Brandy Hicks MRN: [id number] Date of Birth: 1933/02/24  Transition of Care [facility]) CM/SW Contact:    Kingsley Plan, RN Phone Number: 01/06/2020, 12:14 PM  Clinical Narrative:                 Spoke to daughter at bedside. Discussed PT recommendations. PAtient from [place] at [place]. Per Aline Brochure at [facility] requested hospital to fax referral to [facility] and they will "take care of it". NCM called [name] and spoke to [place], per [place] they use [facility] and NCM can arrange with [facility]. Explained to daughter Bev, she requested to call back and speak to [place] at [place]. NCM called back spoke to [place], explained above, Victorino Dike is unavailable at present, [place] [facility] will have Victorino Dike return call. In mean time NCM requested fax number to fax HHPT order to and was given [phone]. NCM requested order. Received orders and face to face and faxed to [facility]    Patient does need shower chair and walker , Bev agreeable for NCM to order through [employer] with Velna Hatchet   Expected Discharge Plan: Home w Home Health Services Barriers to Discharge: Continued Medical Work up   Patient Goals and CMS Choice Patient states their goals for this hospitalization and ongoing recovery are:: to return to home CMS Medicare.gov Compare Post Acute Care list provided to:: Patient Choice offered to / list presented to : Patient, Adult Children  Expected Discharge Plan and Services Expected Discharge Plan: Home w Home Health Services   Discharge Planning Services: CM Consult Post Acute Care Choice: Home Health, Durable Medical Equipment Living arrangements for the past 2 months: Apartment                 DME Arranged: Shower stool, Walker rolling DME Agency: [employer] Date DME Agency Contacted: 01/06/20 Time DME Agency Contacted: 1214 Representative spoke with at DME Agency: [employer] HH Arranged:  PT          Prior Living Arrangements/Services Living arrangements for the past 2 months: Apartment Lives with:: Self Patient language and need for interpreter reviewed:: Yes Do you feel safe going back to the place where you live?: Yes      Need for Family Participation in Patient Care: Yes (Comment) Care giver support system in place?: Yes (comment)   Criminal Activity/Legal Involvement Pertinent to Current Situation/Hospitalization: No - Comment as needed  Activities of Daily Living      Permission Sought/Granted   Permission granted to share information with : Yes, Verbal Permission Granted  Share Information with NAME: daughter Bev           Emotional Assessment Appearance:: Appears stated age     Orientation: : Oriented to Self, Oriented to Place, Oriented to  Time, Oriented to Situation      Admission diagnosis:  Acute cholecystitis [K81.0] Epigastric abdominal pain [R10.13] Calculus of gallbladder with acute cholecystitis without obstruction [K80.00] Abdominal pain [R10.9] Cholecystitis, acute [K81.0] Patient Active Problem List   Diagnosis Date Noted  . Cholecystitis, acute 01/03/2020  . Acute cholecystitis 01/02/2020  . Hypomagnesemia 01/02/2020  . Hyperkalemia 01/02/2020  . Mixed hyperlipidemia 01/02/2020  . Hypothyroidism 01/02/2020  . Chest pain 01/02/2020  . Lung infiltrate on CT 01/02/2020  . Fecal impaction (HCC) 04/19/2019  . Acute urinary retention 04/19/2019  . Leukocytosis 04/19/2019  . Urinary retention 04/19/2019  . Hyponatremia 04/19/2019  . Hypokalemia  04/19/2019  . Voice tremor 10/13/2017  . GBS (Guillain-Barre syndrome) (HCC) 07/31/2015  . History of Guillain-Barre syndrome 05/02/2015  . Anxiety state 05/02/2015  . Panic attack 05/02/2015  . Essential tremor 05/02/2015  . Altered mental status   . UTI (urinary tract infection) 04/22/2015  . Acute kidney injury (HCC) 04/22/2015  . Essential hypertension, benign 11/28/2013  .  Depression 04/09/2011  . Insomnia 04/09/2011   PCP:  [name], MD Pharmacy:   [facility] - [place], [place] - [address] [place] [address] Phone: [phone] Fax: [phone]     Social Determinants of Health (SDOH) Interventions    Readmission Risk Interventions No flowsheet data found.

## 2020-01-06 NOTE — Progress Notes (Signed)
PT Cancellation Note  Patient Details Name: Brandy Hicks [name] MRN: [id number] DOB: [birth year]   Cancelled Treatment:    Reason Eval/Treat Not Completed: (P) Other (comment) Pt politely defer, stating she is awaiting ultrasound and has been NPO, would prefer to eat prior to mobilizing. Will continue efforts per PT POC as schedule permits  Thalya Fouche M Francene Mcerlean 01/06/2020, 10:24 AM

## 2020-01-06 NOTE — Progress Notes (Signed)
PROGRESS NOTE    Brandy Hicks  ALP:[id number] DOB: 10-Feb-1933 DOA: 01/02/2020 PCP: [name], MD   Chief Complaint  Patient presents with  . Chest Pain   Brief Narrative: 86 year old female with HLD, HTN, hypothyroidism, GB syndrome history presented to [facility] with chest pain nausea vomiting sudden onset.  In the ED initially concern for ACS cardiac enzymes are unremarkable underwent CT angio no evidence of pulmonary embolism but did incidentally reveal impacted 6 mm stone in the neck of the gallbladder with gallbladder distention and edema.  Seen by surgery and subsequently underwent laparoscopic cholecystectomy 10/19. Postop diet slowly advanced, awaiting for return of GI function.  Subjective: no acute events overnight.  Afebrile.  Vitals stable, leukocytosis stable 12 point 5K. jpo drain + with bloody output had 470 ml output Patient reports she had 2 bowel movements complains of ongoing pain and feels wonderful with the lidocaine patch. She feels weak.  Assessment & Plan:  Acute gangrenous cholecystitis: Initially presented with nausea vomiting epigastric pain concerning for ACS work-up essentially unremarkable cardiac work-up with troponin EKG.  CT scan revealed impacted 6 mm stone in the neck of gallbladder with gallbladder distention, seen by CCS,underwent laparoscopic cholecystectomy 10/19.  Soft diet, having significant drain output on JP, leukocytosis improving and  On iv  Zosyn ( 10/18 onwards) per surgery.  She is afebrile. Pain is being addressed with lidocaine patch, tramadol, oxycodone.  Continue on gentle IV fluid hydration while oral intake is poor and and having drain output. Continue current management awaiting for return of GI function and clearance from surgery.  Appreciate surgery input on board who are planning for HIDA scan to exclude below given increased output.  Physical deconditioning in the setting of #1 will likely need rehabilitation.   Continue PT OT.  Abdominal distention suspect mild ileus postop.  Continue gentle IV hydration, appreciate surgical input on board.   Chest pain due to cholecystitis.  Continue intermittent PPI.  Troponin negative.  Hypertension/hyperlipidemia: BP is controlled, continue home cont aspirin Lipitor, propanolol.   Essential tremor: Continue home primidone and propanolol.  Lung infiltrates on CT scan: No evidence of pneumonia.  Monitor.  Continue incentive spirometry ambulation.  Hypokalemia-resolved.  Mild AKI creatinine is Iimproving, continue gentle hydration.    Leukocytosis likely from #1, improving, off Zosyn.   Nutrition: Diet Order            DIET SOFT Room service appropriate? Yes; Fluid consistency: Thin  Diet effective now                 DVT prophylaxis: enoxaparin (LOVENOX) injection 40 mg Start: 01/04/20 1000 SCDs Start: 01/02/20 2035 Code Status:   Code Status: DNR  Family Communication: plan of care discussed with patient at bedside.  Called POA granddaughter [name] unable to reach.  Call daughter but no answer.  Will reattempt to update family.  Status is: Inpatient Remains inpatient appropriate because:Ongoing postop management from cholecystectomy, monitoring for diet tolerance, return of bowel function  Dispo: The patient is from: Home , [name]              Anticipated d/c is to: TBD, may need SNF.              Anticipated d/c date is1- 2 days              Patient currently is not medically stable to d/c.  home once cleared by surgery.  Increase activity.  Consultants:see  note  Procedures:see note  Culture/Microbiology    Component Value Date/Time   SDES BLOOD LEFT HAND 01/04/2020 0112   SPECREQUEST  01/04/2020 0112    BOTTLES DRAWN AEROBIC ONLY Blood Culture adequate volume   CULT  01/04/2020 0112    NO GROWTH 2 DAYS Performed at [facility], [address] [place], [place] [address]    REPTSTATUS PENDING 01/04/2020 0112      Other culture-see note  Medications: Scheduled Meds: . acetaminophen  650 mg Oral Q6H  . aspirin EC  81 mg Oral Daily  . atorvastatin  40 mg Oral q1800  . diphenhydrAMINE  25 mg Oral QHS  . docusate sodium  100 mg Oral BID  . enoxaparin (LOVENOX) injection  40 mg Subcutaneous Q24H  . fluticasone  2 spray Each Nare Daily  . gabapentin  300 mg Oral BID  . levothyroxine  137 mcg Oral Q0600  . lidocaine  1 patch Transdermal Q24H  . loratadine  10 mg Oral Daily  . methocarbamol  500 mg Oral TID  . pantoprazole  40 mg Oral QHS  . potassium chloride SA  40 mEq Oral Once  . primidone  200 mg Oral BID  . propranolol ER  60 mg Oral Daily  . sertraline  50 mg Oral Daily  . simethicone  80 mg Oral QID  . vitamin B-12  1,000 mcg Oral Daily   Continuous Infusions: . 0.9 % NaCl with KCl 20 mEq / L 50 mL/hr at 01/06/20 0102  . piperacillin-tazobactam (ZOSYN)  IV 3.375 g (01/06/20 0247)    Antimicrobials: Anti-infectives (From admission, onward)   Start     Dose/Rate Route Frequency Ordered Stop   01/03/20 0300  piperacillin-tazobactam (ZOSYN) IVPB 3.375 g       "Followed by" Linked Group Details   3.375 g 12.5 mL/hr over 240 Minutes Intravenous Every 8 hours 01/02/20 2040     01/02/20 2045  piperacillin-tazobactam (ZOSYN) IVPB 3.375 g       "Followed by" Linked Group Details   3.375 g 100 mL/hr over 30 Minutes Intravenous  Once 01/02/20 2040 01/03/20 0035   01/02/20 1800  cefTRIAXone (ROCEPHIN) 2 g in sodium chloride 0.9 % 100 mL IVPB        2 g 200 mL/hr over 30 Minutes Intravenous  Once 01/02/20 1751 01/02/20 1932     Objective: Vitals: Today's Vitals   01/05/20 1718 01/05/20 2013 01/05/20 2045 01/06/20 0444  BP:  (!) 159/63  133/61  Pulse:  (!) 57  (!) 56  Resp:  14  16  Temp:  98.6 F (37 C)  98.2 F (36.8 C)  TempSrc:  Oral  Oral  SpO2:  98%  97%  PainSc: 4   2      Intake/Output Summary (Last 24 hours) at 01/06/2020 0843 Last data filed at 01/06/2020 0630 Gross  per 24 hour  Intake 1438.78 ml  Output 470 ml  Net 968.78 ml   There were no vitals filed for this visit. Weight change:   Intake/Output from previous day: 10/21 0701 - 10/22 0700 In: 1438.8 [P.O.:920; I.V.:468.8; IV Piggyback:50] Out: 470 [Drains:470] Intake/Output this shift: No intake/output data recorded.  Examination:  General exam: AAO, elderly, frail, NAD, weak appearing. HEENT:Oral mucosa moist, Ear/Nose WNL grossly, dentition normal. Respiratory system: bilaterally clear,no wheezing or crackles,no use of accessory muscle Cardiovascular system: S1 & S2 +, No JVD,. Gastrointestinal system: Abdomen soft, tender in right upper quadrant bowel sounds sluggish mildly distended  Nervous System:Alert, awake, moving extremities and grossly nonfocal Extremities: No edema, distal peripheral pulses palpable.  Skin: No rashes,no icterus. MSK: Normal muscle bulk,tone, power  Data Reviewed: I have personally reviewed following labs and imaging studies CBC: Recent Labs  Lab 01/02/20 1259 01/03/20 0533 01/04/20 0112 01/05/20 0232 01/06/20 0118  WBC 9.3 13.3* 14.6* 12.8* 12.5*  NEUTROABS  --  10.3*  --   --   --   HGB 13.2 12.5 10.6* 10.4* 10.8*  HCT 39.2 36.8 31.7* 31.1* 33.0*  MCV 96.3 97.6 98.8 98.4 98.5  PLT 260 288 212 207 263   Basic Metabolic Panel: Recent Labs  Lab 01/02/20 1519 01/03/20 0533 01/04/20 0112 01/05/20 0232 01/06/20 0118  NA 135 135 135 135 135  K 3.8 3.6 3.3* 3.4* 3.6  CL 99 100 101 104 107  CO2 23 25 23 24  20*  GLUCOSE 154* 126* 145* 121* 106*  BUN 18 15 15 13 14   CREATININE 1.17* 1.03* 1.22* 1.18* 1.19*  CALCIUM 8.9 8.5* 8.0* 7.9* 8.2*  MG 1.6* 1.9  --   --   --    GFR: CrCl cannot be calculated (Unknown ideal weight.). Liver Function Tests: Recent Labs  Lab 01/02/20 1519 01/03/20 0533 01/04/20 0112  AST 32 37 50*  ALT 24 28 37  ALKPHOS 73 63 45  BILITOT 0.5 0.9 0.7  PROT 6.7 6.5 5.9*  ALBUMIN 3.6 3.2* 2.9*   Recent Labs  Lab  01/02/20 1519  LIPASE 31   No results for input(s): AMMONIA in the last 168 hours. Coagulation Profile: Recent Labs  Lab 01/03/20 0533  INR 1.1   Cardiac Enzymes: No results for input(s): CKTOTAL, CKMB, CKMBINDEX, TROPONINI in the last 168 hours. BNP (last 3 results) No results for input(s): PROBNP in the last 8760 hours. HbA1C: No results for input(s): HGBA1C in the last 72 hours. CBG: No results for input(s): GLUCAP in the last 168 hours. Lipid Profile: No results for input(s): CHOL, HDL, LDLCALC, TRIG, CHOLHDL, LDLDIRECT in the last 72 hours. Thyroid Function Tests: No results for input(s): TSH, T4TOTAL, FREET4, T3FREE, THYROIDAB in the last 72 hours. Anemia Panel: No results for input(s): VITAMINB12, FOLATE, FERRITIN, TIBC, IRON, RETICCTPCT in the last 72 hours. Sepsis Labs: Recent Labs  Lab 01/02/20 2258  LATICACIDVEN 1.2    Recent Results (from the past 240 hour(s))  Respiratory Panel by RT PCR (Flu A&B, Covid) - Nasopharyngeal Swab     Status: None   Collection Time: 01/02/20  6:37 PM   Specimen: Nasopharyngeal Swab  Result Value Ref Range Status   SARS Coronavirus 2 by RT PCR NEGATIVE NEGATIVE Final    Comment: (NOTE) SARS-CoV-2 target nucleic acids are NOT DETECTED.  The SARS-CoV-2 RNA is generally detectable in upper respiratoy specimens during the acute phase of infection. The lowest concentration of SARS-CoV-2 viral copies this assay can detect is 131 copies/mL. A negative result does not preclude SARS-Cov-2 infection and should not be used as the sole basis for treatment or other patient management decisions. A negative result may occur with  improper specimen collection/handling, submission of specimen other than nasopharyngeal swab, presence of viral mutation(s) within the areas targeted by this assay, and inadequate number of viral copies (<131 copies/mL). A negative result must be combined with clinical observations, patient history, and  epidemiological information. The expected result is Negative.  Fact Sheet for Patients:  2259  Fact Sheet for Healthcare Providers:  01/04/20  This test is no t yet approved or cleared by  the [employer] [employer] FDA and  has been authorized for detection and/or diagnosis of SARS-CoV-2 by FDA under an Emergency Use Authorization (EUA). This EUA will remain  in effect (meaning this test can be used) for the duration of the COVID-19 declaration under Section 564(b)(1) of the Act, 21 U.S.C. section 360bbb-3(b)(1), unless the authorization is terminated or revoked sooner.     Influenza A by PCR NEGATIVE NEGATIVE Final   Influenza B by PCR NEGATIVE NEGATIVE Final    Comment: (NOTE) The Xpert Xpress SARS-CoV-2/FLU/RSV assay is intended as an aid in  the diagnosis of influenza from Nasopharyngeal swab specimens and  should not be used as a sole basis for treatment. Nasal washings and  aspirates are unacceptable for Xpert Xpress SARS-CoV-2/FLU/RSV  testing.  Fact Sheet for Patients: https://www.moore.com/https://www.fda.gov/media/142436/download  Fact Sheet for Healthcare Providers: https://www.young.biz/https://www.fda.gov/media/142435/download  This test is not yet approved or cleared by the [place] FDA and  has been authorized for detection and/or diagnosis of SARS-CoV-2 by  FDA under an Emergency Use Authorization (EUA). This EUA will remain  in effect (meaning this test can be used) for the duration of the  Covid-19 declaration under Section 564(b)(1) of the Act, 21  U.S.C. section 360bbb-3(b)(1), unless the authorization is  terminated or revoked. Performed at [facility], [address] [place], [place] [address]   Culture, blood (routine x 2)     Status: None (Preliminary result)   Collection Time: 01/02/20 10:42 PM   Specimen: BLOOD  Result Value Ref Range Status   Specimen Description BLOOD SITE NOT SPECIFIED  Final   Special Requests    Final    BOTTLES DRAWN AEROBIC AND ANAEROBIC Blood Culture adequate volume   Culture   Final    NO GROWTH 4 DAYS Performed at [facility], [address] [place], [place] [address]    Report Status PENDING  Incomplete  Culture, blood (routine x 2)     Status: None (Preliminary result)   Collection Time: 01/04/20  1:12 AM   Specimen: BLOOD LEFT HAND  Result Value Ref Range Status   Specimen Description BLOOD LEFT HAND  Final   Special Requests   Final    BOTTLES DRAWN AEROBIC ONLY Blood Culture adequate volume   Culture   Final    NO GROWTH 2 DAYS Performed at [facility], [address] [place], [place] [address]    Report Status PENDING  Incomplete     Radiology Studies: No results found.   LOS: 3 days   Lanae Boastamesh Samara Stankowski, MD [facility]  01/06/2020, 8:43 AM

## 2020-01-06 NOTE — Progress Notes (Signed)
OT Cancellation Note  Patient Details Name: Brandy Hicks MRN: [id number] DOB: [birth year]   Cancelled Treatment:    Reason Eval/Treat Not Completed: Patient at procedure or test/ unavailable (Pt OTF for test, will follow up as available and appropriate)  Dalphine Handing, MSOT, OTR/L Acute Rehabilitation Services [facility] Office Number: [phone] Pager: [phone]  Dalphine Handing 01/06/2020, 12:46 PM

## 2020-01-06 NOTE — Progress Notes (Addendum)
[name] Progress Note  3 Days Post-Op  Subjective: Persistent abdominal pain but slightly better than yesterday - states Lidoderm patch helped a lot. Having BMs. Denies nausea or vomiting. Reports feeling hungry. Remains on supplemental O2.   Objective: Vital signs in last 24 hours: Temp:  [97.6 F (36.4 C)-98.6 F (37 C)] 98.2 F (36.8 C) (10/22 0444) Pulse Rate:  [56-59] 56 (10/22 0444) Resp:  [14-18] 16 (10/22 0444) BP: (131-159)/(51-63) 133/61 (10/22 0444) SpO2:  [97 %-98 %] 97 % (10/22 0444) Last BM Date: 01/02/20  Intake/Output from previous day: 10/21 0701 - 10/22 0700 In: 1438.8 [P.O.:920; I.V.:468.8; IV Piggyback:50] Out: 470 [Drains:470] Intake/Output this shift: Total I/O In: -  Out: 80 [Drains:80]  PE: General: pleasant, WD, elderly female who is sitting up on bedside commode, NAD HEENT:  Sclera are anicteric Lungs: CTAB, on supplemental O2 (3L) Abd: soft, appropriately ttp, incisions c/d/i, drain with SS fluid.  > 400 cc/24h MS: all 4 extremities are symmetrical with no cyanosis, clubbing, or edema.  Lab Results:  Recent Labs    01/05/20 0232 01/06/20 0118  WBC 12.8* 12.5*  HGB 10.4* 10.8*  HCT 31.1* 33.0*  PLT 207 263   BMET Recent Labs    01/05/20 0232 01/06/20 0118  NA 135 135  K 3.4* 3.6  CL 104 107  CO2 24 20*  GLUCOSE 121* 106*  BUN 13 14  CREATININE 1.18* 1.19*  CALCIUM 7.9* 8.2*   PT/INR No results for input(s): LABPROT, INR in the last 72 hours. CMP     Component Value Date/Time   NA 135 01/06/2020 0118   K 3.6 01/06/2020 0118   CL 107 01/06/2020 0118   CO2 20 (L) 01/06/2020 0118   GLUCOSE 106 (H) 01/06/2020 0118   BUN 14 01/06/2020 0118   CREATININE 1.19 (H) 01/06/2020 0118   CALCIUM 8.2 (L) 01/06/2020 0118   PROT 5.9 (L) 01/04/2020 0112   ALBUMIN 2.9 (L) 01/04/2020 0112   AST 50 (H) 01/04/2020 0112   ALT 37 01/04/2020 0112   ALKPHOS 45 01/04/2020 0112   BILITOT 0.7 01/04/2020 0112   GFRNONAA 45 (L)  01/06/2020 0118   GFRAA 59 (L) 04/20/2019 0432   Lipase     Component Value Date/Time   LIPASE 31 01/02/2020 1519       Studies/Results: No results found.  Anti-infectives: Anti-infectives (From admission, onward)   Start     Dose/Rate Route Frequency Ordered Stop   01/03/20 0300  piperacillin-tazobactam (ZOSYN) IVPB 3.375 g       "Followed by" Linked Group Details   3.375 g 12.5 mL/hr over 240 Minutes Intravenous Every 8 hours 01/02/20 2040     01/02/20 2045  piperacillin-tazobactam (ZOSYN) IVPB 3.375 g       "Followed by" Linked Group Details   3.375 g 100 mL/hr over 30 Minutes Intravenous  Once 01/02/20 2040 01/03/20 0035   01/02/20 1800  cefTRIAXone (ROCEPHIN) 2 g in sodium chloride 0.9 % 100 mL IVPB        2 g 200 mL/hr over 30 Minutes Intravenous  Once 01/02/20 1751 01/02/20 1932       Assessment/Plan HTN HLD Hypothyroidism Hx of TIA Hx of Guillain Barre Essential tremor Depression  Acute gangrenous cholecystitis  S/P laparoscopic cholecystectomy 01/03/20 Dr. Luisa Hicks - POD#3 - continue lidoderm patch, scheduled APAP and robaxin, PRN oxy for pain - replace K for goal K > 4.0  - increased drain output last 24h, suspect positional - drain output increased  when patient sat up/got out of bed/moved more yesterday. No overt bile. Given significant risk of bile leak will order HIDA scan to r/o cystic stump leak - plan discussed with Dr. Luisa Hicks.  - continue IV abx today, WBC 12, stable - PRN albuterol nebs for wheezing - PT recommending home health  FEN: NPO for HIDA, resume SOFT or HH diet after study  VTE: Lovenox ID: rocephin 10/18; Zosyn 10/18>>   LOS: 3 days    Brandy Hicks , [facility] 01/06/2020, 9:37 AM Please see Amion for pager number during day hours 7:00am-4:30pm

## 2020-01-07 LAB — CBC
HCT: 33.4 % — ABNORMAL LOW (ref 36.0–46.0)
Hemoglobin: 11.1 g/dL — ABNORMAL LOW (ref 12.0–15.0)
MCH: 33 pg (ref 26.0–34.0)
MCHC: 33.2 g/dL (ref 30.0–36.0)
MCV: 99.4 fL (ref 80.0–100.0)
Platelets: 330 10*3/uL (ref 150–400)
RBC: 3.36 MIL/uL — ABNORMAL LOW (ref 3.87–5.11)
RDW: 12.7 % (ref 11.5–15.5)
WBC: 10.1 10*3/uL (ref 4.0–10.5)
nRBC: 0 % (ref 0.0–0.2)

## 2020-01-07 LAB — BASIC METABOLIC PANEL
Anion gap: 11 (ref 5–15)
BUN: 15 mg/dL (ref 8–23)
CO2: 17 mmol/L — ABNORMAL LOW (ref 22–32)
Calcium: 8.1 mg/dL — ABNORMAL LOW (ref 8.9–10.3)
Chloride: 110 mmol/L (ref 98–111)
Creatinine, Ser: 1.07 mg/dL — ABNORMAL HIGH (ref 0.44–1.00)
GFR, Estimated: 51 mL/min — ABNORMAL LOW (ref >60)
Glucose, Bld: 114 mg/dL — ABNORMAL HIGH (ref 70–99)
Potassium: 3.6 mmol/L (ref 3.5–5.1)
Sodium: 138 mmol/L (ref 135–145)

## 2020-01-07 LAB — CULTURE, BLOOD (ROUTINE X 2)
Culture: NO GROWTH
Special Requests: ADEQUATE

## 2020-01-07 MED ORDER — TRAMADOL HCL 50 MG PO TABS
50.0000 mg | ORAL_TABLET | Freq: Four times a day (QID) | ORAL | 0 refills | Status: DC | PRN
Start: 2020-01-07 — End: 2020-01-20

## 2020-01-07 MED ORDER — PANTOPRAZOLE SODIUM 40 MG PO TBEC: 40.0000 mg | DELAYED_RELEASE_TABLET | Freq: Every day | ORAL | 0 refills | Status: DC

## 2020-01-07 MED ORDER — LIDOCAINE 5 % EX PTCH: 1.0000 | MEDICATED_PATCH | CUTANEOUS | 0 refills | Status: DC

## 2020-01-07 MED ORDER — METHOCARBAMOL 500 MG PO TABS: 500.0000 mg | ORAL_TABLET | Freq: Three times a day (TID) | ORAL | 0 refills | Status: DC | PRN

## 2020-01-07 MED ORDER — ACETAMINOPHEN 325 MG PO TABS
650.0000 mg | ORAL_TABLET | Freq: Four times a day (QID) | ORAL | Status: DC | PRN
Start: 2020-01-07 — End: 2020-01-20

## 2020-01-07 MED ORDER — AMOXICILLIN-POT CLAVULANATE 875-125 MG PO TABS: 1.0000 | ORAL_TABLET | Freq: Two times a day (BID) | ORAL | 0 refills | Status: AC

## 2020-01-07 NOTE — Progress Notes (Signed)
SATURATION QUALIFICATIONS: (This note is used to comply with regulatory documentation for home oxygen) ° °Patient Saturations on Room Air at Rest = 93% ° °Patient Saturations on Room Air while Ambulating = 94% ° °Patient Saturations on 0 Liters of oxygen while Ambulating = 94% ° °Please briefly explain why patient needs home oxygen: °

## 2020-01-07 NOTE — Discharge Summary (Signed)
Physician Discharge Summary  Brandy Hicks [name] UJW:[id number] DOB: July 07, 1932 DOA: 01/02/2020  PCP: [name], MD  Admit date: 01/02/2020 Discharge date: 01/07/2020  Admitted From:[name] Disposition:[facility]  Recommendations for Outpatient Follow-up:  Follow up with PCP in 1-2 weeks Please obtain BMP/CBC in one week Please follow up on the following pending results:  Home Health: YES  Equipment/Devices:Walker,Shower chair.  Discharge Condition: Stable Code Status:   Code Status: DNR Diet recommendation:  Diet Order             Diet - low sodium heart healthy           Diet Heart Room service appropriate? Yes; Fluid consistency: Thin  Diet effective now                   Brief/Interim Summary: 86 year old female with HLD, HTN, hypothyroidism, GB syndrome history presented to [facility] with chest pain nausea vomiting sudden onset.  In the ED initially concern for ACS cardiac enzymes are unremarkable underwent CT angio no evidence of pulmonary embolism but did incidentally reveal impacted 6 mm stone in the neck of the gallbladder with gallbladder distention and edema.  Seen by surgery and subsequently underwent laparoscopic cholecystectomy 10/19. Postop patient had pain issues pain medication was switched by surgery.  Diet was slowly advanced.  She is tolerating diet having bowel movement.  She had drain output which was increased so underwent HIDA scan there is no evidence of biliary leak. Patient is ambulating.  She is eager to go home today.  She is alert awake oriented tolerating diet and stable for discharge,discussed with surgery and okay for discharge and follow-up for the drain management as outpatient.  Discharge Diagnoses:  Acute gangrenous cholecystitis: Initially presented with nausea vomiting epigastric pain concerning for ACS work-up essentially unremarkable cardiac work-up with troponin EKG.  CT scan revealed impacted 6 mm stone in  the neck of gallbladder with gallbladder distention, seen by [facility],underwent laparoscopic cholecystectomy 10/19.  Soft diet, having significant drain output on JP, leukocytosis improving and  On iv  Zosyn ( 10/18 onwards) per surgery.  Patient afebrile underwent HIDA scan negative for biliary leak.  Patient was seen by surgery this morning and okay for discharge home on oral Augmentin for 3 more days.  She will continue with pain management and along with lidocaine .setting of home health PT.  She will follow up with surgery for drain management. She ambulated w/o oxygen and did well.    Physical deconditioning in the setting of #1 will likely need rehabilitation.  Patient is requesting for home health,   Abdominal distention suspect mild ileus postop.  Seems to have resolved tolerating diet and having bowel movement.   Chest pain due to cholecystitis.  Continue intermittent PPI.  Troponin negative.   Hypertension/hyperlipidemia: BP is controlled, continue home cont aspirin Lipitor, propanolol, resume chlorthalidone, continue to hold amlodipine and losartan and follow-up with PCP, monitor blood pressure.    Essential tremor: Continue home primidone and propanolol.   Lung infiltrates on CT scan: No evidence of pneumonia.  Monitor.  Continue incentive spirometry ambulation.   Hypokalemia-resolved.   Mild AKI creatinine is Iimproved   Leukocytosis resolved  Consults: General surgery.  Subjective: Alert awake, pain controlled.  Having Polmon.  Discharge Exam: Vitals:   01/06/20 2117 01/07/20 0451  BP: (!) 171/49 (!) 143/55  Pulse: 64 (!) 59  Resp: 18 17  Temp: 98.5 F (36.9 C) 98.2 F (36.8 C)  SpO2:  95% 97%   General: Pt is alert, awake, not in acute distress Cardiovascular: RRR, S1/S2 +, no rubs, no gallops Respiratory: CTA bilaterally, no wheezing, no rhonchi Abdominal: Soft, NT, ND, bowel sounds +, RUQ drain intact. Extremities: no edema, no cyanosis  Discharge  Instructions  Discharge Instructions     Diet - low sodium heart healthy   Complete by: As directed    Discharge wound care:   Complete by: As directed    Keep the area dry clean and follow-up  w/ surgery for drain management and wound management   Increase activity slowly   Complete by: As directed       Allergies as of 01/07/2020       Reactions   Hydroquinone    Pinkness and edema of face and eyelids, severe        Medication List     STOP taking these medications    amLODipine 10 MG tablet Commonly known as: NORVASC   losartan 100 MG tablet Commonly known as: COZAAR       TAKE these medications    acetaminophen 325 MG tablet Commonly known as: TYLENOL Take 2 tablets (650 mg total) by mouth every 6 (six) hours as needed. What changed:  medication strength how much to take reasons to take this   amoxicillin-clavulanate 875-125 MG tablet Commonly known as: Augmentin Take 1 tablet by mouth 2 (two) times daily for 3 days.   aspirin 81 MG tablet Take 81 mg by mouth daily.   atorvastatin 40 MG tablet Commonly known as: LIPITOR Take 40 mg by mouth daily.   betamethasone dipropionate 0.05 % cream Apply 1 application topically 2 (two) times daily.   calcium-vitamin D 500-200 MG-UNIT tablet Commonly known as: OSCAL WITH D Take 1 tablet by mouth daily with breakfast.   chlorthalidone 25 MG tablet Commonly known as: HYGROTON Take 1 tablet (25 mg total) by mouth daily.   diphenhydrAMINE 25 mg capsule Commonly known as: BENADRYL Take 25 mg by mouth at bedtime.   fluticasone 50 MCG/ACT nasal spray Commonly known as: FLONASE Place 2 sprays into both nostrils daily.   gabapentin 300 MG capsule Commonly known as: NEURONTIN Take 300 mg by mouth 2 (two) times daily as needed for pain.   levothyroxine 137 MCG tablet Commonly known as: SYNTHROID Take 137 mcg by mouth daily.   lidocaine 5 % Commonly known as: LIDODERM Place 1 patch onto the skin  daily. Remove & Discard patch within 12 hours or as directed by MD Start taking on: January 08, 2020   loratadine 10 MG tablet Commonly known as: CLARITIN Take 10 mg by mouth daily.   Melatonin 10 MG Tabs Take 10 mg by mouth at bedtime.   methocarbamol 500 MG tablet Commonly known as: ROBAXIN Take 1 tablet (500 mg total) by mouth every 8 (eight) hours as needed for muscle spasms.   MULTIVITAMIN ADULT PO Take 1 tablet by mouth daily.   pantoprazole 40 MG tablet Commonly known as: PROTONIX Take 1 tablet (40 mg total) by mouth at bedtime.   primidone 50 MG tablet Commonly known as: MYSOLINE Take 2 tablets (100 mg total) by mouth 2 (two) times daily. What changed:  how much to take when to take this   propranolol ER 60 MG 24 hr capsule Commonly known as: INDERAL LA TAKE ONE CAPSULE BY MOUTH EVERY NIGHT AT BEDTIME What changed: when to take this   sertraline 50 MG tablet Commonly known as: ZOLOFT Take 50 mg  by mouth daily.   traMADol 50 MG tablet Commonly known as: ULTRAM Take 1-2 tablets (50-100 mg total) by mouth every 6 (six) hours as needed for moderate pain. What changed:  how much to take when to take this reasons to take this   vitamin B-12 1000 MCG tablet Commonly known as: CYANOCOBALAMIN Take 1,000 mcg by mouth daily.   Vitamin D 50 MCG (2000 UT) tablet Take 2,000 Units by mouth daily.               Durable Medical Equipment  (From admission, onward)           Start     Ordered   01/07/20 1305  For home use only DME Shower stool  Once        01/07/20 1304   01/07/20 1305  For home use only DME Walker  Once       Question:  Patient needs a walker to treat with the following condition  Answer:  Physical deconditioning   01/07/20 1304              Discharge Care Instructions  (From admission, onward)           Start     Ordered   01/07/20 0000  Discharge wound care:       Comments: Keep the area dry clean and follow-up  w/  surgery for drain management and wound management   01/07/20 1326            Follow-up Information     Sur[facility], [facility]. Go on 01/11/2020.   Specialty: General Surgery Why: at 10:40 AM for drain removal by a nurse, please arrive 30 minutes early  Contact information: [address] [place] [address] [phone]         Hedda SladeGosai, Puja, PA-C. Go on 01/24/2020.   Specialty: General Surgery Why: at 8 :45 AM for post-operative follow up, please arrive 15 minutes early. Contact information: [address] [place] [address] [phone]                Allergies  Allergen Reactions   Hydroquinone     Pinkness and edema of face and eyelids, severe    The results of significant diagnostics from this hospitalization (including imaging, microbiology, ancillary and laboratory) are listed below for reference.    Microbiology: Recent Results (from the past 240 hour(s))  Respiratory Panel by RT PCR (Flu A&B, Covid) - Nasopharyngeal Swab     Status: None   Collection Time: 01/02/20  6:37 PM   Specimen: Nasopharyngeal Swab  Result Value Ref Range Status   SARS Coronavirus 2 by RT PCR NEGATIVE NEGATIVE Final    Comment: (NOTE) SARS-CoV-2 target nucleic acids are NOT DETECTED.  The SARS-CoV-2 RNA is generally detectable in upper respiratoy specimens during the acute phase of infection. The lowest concentration of SARS-CoV-2 viral copies this assay can detect is 131 copies/mL. A negative result does not preclude SARS-Cov-2 infection and should not be used as the sole basis for treatment or other patient management decisions. A negative result may occur with  improper specimen collection/handling, submission of specimen other than nasopharyngeal swab, presence of viral mutation(s) within the areas targeted by this assay, and inadequate number of viral copies (<131 copies/mL). A negative result must be combined with clinical observations,  patient history, and epidemiological information. The expected result is Negative.  Fact Sheet for Patients:  https://www.moore.com/https://www.fda.gov/media/142436/download  Fact Sheet for Healthcare  Providers:  https://www.young.biz/  This test is no t yet approved or cleared by the [place] and  has been authorized for detection and/or diagnosis of SARS-CoV-2 by FDA under an Emergency Use Authorization (EUA). This EUA will remain  in effect (meaning this test can be used) for the duration of the COVID-19 declaration under Section 564(b)(1) of the Act, 21 U.S.C. section 360bbb-3(b)(1), unless the authorization is terminated or revoked sooner.     Influenza A by PCR NEGATIVE NEGATIVE Final   Influenza B by PCR NEGATIVE NEGATIVE Final    Comment: (NOTE) The Xpert Xpress SARS-CoV-2/FLU/RSV assay is intended as an aid in  the diagnosis of influenza from Nasopharyngeal swab specimens and  should not be used as a sole basis for treatment. Nasal washings and  aspirates are unacceptable for Xpert Xpress SARS-CoV-2/FLU/RSV  testing.  Fact Sheet for Patients: https://www.moore.com/  Fact Sheet for Healthcare Providers: https://www.young.biz/  This test is not yet approved or cleared by the [place] FDA and  has been authorized for detection and/or diagnosis of SARS-CoV-2 by  FDA under an Emergency Use Authorization (EUA). This EUA will remain  in effect (meaning this test can be used) for the duration of the  Covid-19 declaration under Section 564(b)(1) of the Act, 21  U.S.C. section 360bbb-3(b)(1), unless the authorization is  terminated or revoked. Performed at [facility], [address], [place], [place] [address]   Culture, blood (routine x 2)     Status: None   Collection Time: 01/02/20 10:42 PM   Specimen: BLOOD  Result Value Ref Range Status   Specimen Description BLOOD SITE NOT SPECIFIED  Final   Special Requests    Final    BOTTLES DRAWN AEROBIC AND ANAEROBIC Blood Culture adequate volume   Culture   Final    NO GROWTH 5 DAYS Performed at [facility], [address], [place], [place] [address]    Report Status 01/07/2020 FINAL  Final  Culture, blood (routine x 2)     Status: None (Preliminary result)   Collection Time: 01/04/20  1:12 AM   Specimen: BLOOD LEFT HAND  Result Value Ref Range Status   Specimen Description BLOOD LEFT HAND  Final   Special Requests   Final    BOTTLES DRAWN AEROBIC ONLY Blood Culture adequate volume   Culture   Final    NO GROWTH 3 DAYS Performed at [facility], [address] [place], [place] [address]    Report Status PENDING  Incomplete    Procedures/Studies: DG Chest 2 View  Result Date: 01/02/2020 CLINICAL DATA:  Chest pain. EXAM: CHEST - 2 VIEW COMPARISON:  April 22, 2015. FINDINGS: The heart size and mediastinal contours are within normal limits. Both lungs are clear. No pneumothorax or pleural effusion is noted. The visualized skeletal structures are unremarkable. IMPRESSION: No active cardiopulmonary disease. Aortic Atherosclerosis (ICD10-I70.0). Electronically Signed   By: Lupita Raider M.D.   On: 01/02/2020 13:32   NM Hepato W/EF  Result Date: 01/06/2020 CLINICAL DATA:  Recurrent severe RIGHT upper quadrant abdominal pain post cholecystectomy question bile leak EXAM: NUCLEAR MEDICINE HEPATOBILIARY IMAGING TECHNIQUE: Sequential images of the abdomen were obtained out to 60 minutes following intravenous administration of radiopharmaceutical. RADIOPHARMACEUTICALS:  4.9 mCi Tc-20m  Choletec IV COMPARISON:  None FINDINGS: Prompt uptake and biliary excretion of activity by the liver is seen. Normal excretion of tracer into biliary tree with visualization of small-bowel by 15 minutes. No abnormal hepatic  retention of tracer. Gallbladder surgically absent. No abnormal perihepatic tracer accumulation to suggest bile leak. IMPRESSION: Normal  hepatobiliary imaging post cholecystectomy. No evidence of bile leak or biliary obstruction. Electronically Signed   By: Ulyses Southward M.D.   On: 01/06/2020 13:16   CT Angio Chest/Abd/Pel for Dissection W and/or W/WO  Result Date: 01/02/2020 CLINICAL DATA:  Chest pain, abdominal pain, aortic dissection EXAM: CT ANGIOGRAPHY CHEST, ABDOMEN AND PELVIS TECHNIQUE: Non-contrast CT of the chest was initially obtained. Multidetector CT imaging through the chest, abdomen and pelvis was performed using the standard protocol during bolus administration of intravenous contrast. Multiplanar reconstructed images and MIPs were obtained and reviewed to evaluate the vascular anatomy. CONTRAST:  66mL OMNIPAQUE IOHEXOL 350 MG/ML SOLN COMPARISON:  None. FINDINGS: CTA CHEST FINDINGS Cardiovascular: The thoracic aorta is normal in caliber. No evidence of intramural hematoma, dissection, or aneurysm. There is extensive atherosclerotic calcification identified within the aortic arch and proximal arch vasculature resulting in a less than 50% stenosis of the left common carotid artery at its origin. Bovine arch anatomy noted. Pulmonary arterial caliber is within normal limits. Global cardiac size is within normal limits. Moderate atherosclerotic calcification of the coronary arteries. Moderate calcification of the mitral valve annulus. The aortic valve is trileaflet. Mediastinum/Nodes: There is shotty mediastinal adenopathy present without frankly pathologic enlargement noted. Thyroid unremarkable. Esophagus unremarkable. Lungs/Pleura: Scattered nodular infiltrates within the right upper lobe are nonspecific and may be infectious or inflammatory in nature. Scarring noted within the right lung base. No pneumothorax or pleural effusion. Central airways are widely patent. Musculoskeletal: No acute bone abnormality. Review of the MIP images confirms the above findings. CTA ABDOMEN AND PELVIS FINDINGS VASCULAR Aorta: The abdominal aorta is  normal in caliber; no evidence of aneurysm or dissection. There is extensive atherosclerotic calcification noted within the abdominal aorta. No evidence of hemodynamically significant stenosis. Celiac: Widely patent.  Conventional anatomy.  No aneurysm. SMA: Widely patent.  Conventional anatomy. Renals: Dual right and single left renal arteries are widely patent. Normal arterial contour. No aneurysm. IMA: Widely patent. Inflow: Widely patent. Internal iliac arteries are patent bilaterally. Veins: Not well opacified. Review of the MIP images confirms the above findings. NON-VASCULAR Hepatobiliary: The gallbladder is distended. A calcified 6 mm gallstone is seen impacted within the gallbladder neck. The gallbladder wall appears mildly edematous and, together, the findings are suggestive of early changes of acute cholecystitis. No significant internal or external biliary ductal dilation. Pancreas: Unremarkable Spleen: Unremarkable Adrenals/Urinary Tract: The adrenal glands are unremarkable. Simple cortical cysts are seen bilaterally. Mild renal cortical atrophy. The kidneys are otherwise unremarkable. Bladder is unremarkable. Stomach/Bowel: The stomach, small bowel, and large bowel are unremarkable. The appendix is normal. Tiny fat containing umbilical hernia. No free intraperitoneal gas. Tiny bilateral fat containing inguinal hernias. Lymphatic: No pathologic adenopathy within the abdomen and pelvis. Reproductive: Uterus and bilateral adnexa are unremarkable. Other: Rectum unremarkable Musculoskeletal: No acute bone abnormality. Advanced degenerative changes of the lumbosacral junction. Review of the MIP images confirms the above findings. IMPRESSION: No evidence of aortic aneurysm or dissection. Impacted 6 mm gallstone within the a gallbladder neck with gallbladder distension and edematous change suggestive of early acute calculus cholecystitis. Correlation with liver enzymes and physical examination is recommended.  Scattered nodular infiltrates within the right upper lobe, nonspecific, possibly infectious or inflammatory in nature. Aortic Atherosclerosis (ICD10-I70.0). Electronically Signed   By: Helyn Numbers MD   On: 01/02/2020 17:33   US Abdomen Limited RUQ (LIVER/GB)  Result Date: 01/02/2020 CLINICAL  DATA:  Abdominal pain.  Abnormal CT. EXAM: ULTRASOUND ABDOMEN LIMITED RIGHT UPPER QUADRANT COMPARISON:  CTA chest earlier today.  CT 04/19/2019 FINDINGS: Gallbladder: 8 mm gallstone within the gallbladder neck, non mobile. Sludge within the gallbladder. Gallbladder wall thickening measuring 4 mm. Common bile duct: Diameter: Normal caliber, 6-7 mm. Liver: No focal lesion identified. Within normal limits in parenchymal echogenicity. Portal vein is patent on color Doppler imaging with normal direction of blood flow towards the liver. Other: None. IMPRESSION: 8 mm gallstone lodged within the gallbladder neck. Layering sludge within the gallbladder and gallbladder wall thickening. Appearance is concerning for possible acute cholecystitis. Electronically Signed   By: Charlett Nose M.D.   On: 01/02/2020 20:09    Labs: BNP (last 3 results) No results for input(s): BNP in the last 8760 hours. Basic Metabolic Panel: Recent Labs  Lab 01/02/20 1519 01/02/20 1519 01/03/20 0533 01/04/20 0112 01/05/20 0232 01/06/20 0118 01/07/20 0321  NA 135   < > 135 135 135 135 138  K 3.8   < > 3.6 3.3* 3.4* 3.6 3.6  CL 99   < > 100 101 104 107 110  CO2 23   < > 25 23 24  20* 17*  GLUCOSE 154*   < > 126* 145* 121* 106* 114*  BUN 18   < > 15 15 13 14 15   CREATININE 1.17*   < > 1.03* 1.22* 1.18* 1.19* 1.07*  CALCIUM 8.9   < > 8.5* 8.0* 7.9* 8.2* 8.1*  MG 1.6*  --  1.9  --   --   --   --    < > = values in this interval not displayed.   Liver Function Tests: Recent Labs  Lab 01/02/20 1519 01/03/20 0533 01/04/20 0112  AST 32 37 50*  ALT 24 28 37  ALKPHOS 73 63 45  BILITOT 0.5 0.9 0.7  PROT 6.7 6.5 5.9*  ALBUMIN 3.6  3.2* 2.9*   Recent Labs  Lab 01/02/20 1519  LIPASE 31   No results for input(s): AMMONIA in the last 168 hours. CBC: Recent Labs  Lab 01/03/20 0533 01/04/20 0112 01/05/20 0232 01/06/20 0118 01/07/20 0321  WBC 13.3* 14.6* 12.8* 12.5* 10.1  NEUTROABS 10.3*  --   --   --   --   HGB 12.5 10.6* 10.4* 10.8* 11.1*  HCT 36.8 31.7* 31.1* 33.0* 33.4*  MCV 97.6 98.8 98.4 98.5 99.4  PLT 288 212 207 263 330   Cardiac Enzymes: No results for input(s): CKTOTAL, CKMB, CKMBINDEX, TROPONINI in the last 168 hours. BNP: Invalid input(s): POCBNP CBG: No results for input(s): GLUCAP in the last 168 hours. D-Dimer No results for input(s): DDIMER in the last 72 hours. Hgb A1c No results for input(s): HGBA1C in the last 72 hours. Lipid Profile No results for input(s): CHOL, HDL, LDLCALC, TRIG, CHOLHDL, LDLDIRECT in the last 72 hours. Thyroid function studies No results for input(s): TSH, T4TOTAL, T3FREE, THYROIDAB in the last 72 hours.  Invalid input(s): FREET3 Anemia work up No results for input(s): VITAMINB12, FOLATE, FERRITIN, TIBC, IRON, RETICCTPCT in the last 72 hours. Urinalysis    Component Value Date/Time   COLORURINE YELLOW 01/03/2020 0750   APPEARANCEUR HAZY (A) 01/03/2020 0750   LABSPEC 1.033 (H) 01/03/2020 0750   PHURINE 5.0 01/03/2020 0750   GLUCOSEU NEGATIVE 01/03/2020 0750   HGBUR NEGATIVE 01/03/2020 0750   BILIRUBINUR NEGATIVE 01/03/2020 0750   KETONESUR NEGATIVE 01/03/2020 0750   PROTEINUR NEGATIVE 01/03/2020 0750   NITRITE NEGATIVE 01/03/2020 0750  LEUKOCYTESUR NEGATIVE 01/03/2020 0750   Sepsis Labs Invalid input(s): PROCALCITONIN,  WBC,  LACTICIDVEN Microbiology Recent Results (from the past 240 hour(s))  Respiratory Panel by RT PCR (Flu A&B, Covid) - Nasopharyngeal Swab     Status: None   Collection Time: 01/02/20  6:37 PM   Specimen: Nasopharyngeal Swab  Result Value Ref Range Status   SARS Coronavirus 2 by RT PCR NEGATIVE NEGATIVE Final    Comment:  (NOTE) SARS-CoV-2 target nucleic acids are NOT DETECTED.  The SARS-CoV-2 RNA is generally detectable in upper respiratoy specimens during the acute phase of infection. The lowest concentration of SARS-CoV-2 viral copies this assay can detect is 131 copies/mL. A negative result does not preclude SARS-Cov-2 infection and should not be used as the sole basis for treatment or other patient management decisions. A negative result may occur with  improper specimen collection/handling, submission of specimen other than nasopharyngeal swab, presence of viral mutation(s) within the areas targeted by this assay, and inadequate number of viral copies (<131 copies/mL). A negative result must be combined with clinical observations, patient history, and epidemiological information. The expected result is Negative.  Fact Sheet for Patients:  https://www.moore.com/  Fact Sheet for Healthcare Providers:  https://www.young.biz/  This test is no t yet approved or cleared by the [place] FDA and  has been authorized for detection and/or diagnosis of SARS-CoV-2 by FDA under an Emergency Use Authorization (EUA). This EUA will remain  in effect (meaning this test can be used) for the duration of the COVID-19 declaration under Section 564(b)(1) of the Act, 21 U.S.C. section 360bbb-3(b)(1), unless the authorization is terminated or revoked sooner.     Influenza A by PCR NEGATIVE NEGATIVE Final   Influenza B by PCR NEGATIVE NEGATIVE Final    Comment: (NOTE) The Xpert Xpress SARS-CoV-2/FLU/RSV assay is intended as an aid in  the diagnosis of influenza from Nasopharyngeal swab specimens and  should not be used as a sole basis for treatment. Nasal washings and  aspirates are unacceptable for Xpert Xpress SARS-CoV-2/FLU/RSV  testing.  Fact Sheet for Patients: https://www.moore.com/  Fact Sheet for Healthcare  Providers: https://www.young.biz/  This test is not yet approved or cleared by the [place] FDA and  has been authorized for detection and/or diagnosis of SARS-CoV-2 by  FDA under an Emergency Use Authorization (EUA). This EUA will remain  in effect (meaning this test can be used) for the duration of the  Covid-19 declaration under Section 564(b)(1) of the Act, 21  U.S.C. section 360bbb-3(b)(1), unless the authorization is  terminated or revoked. Performed at [facility], [address], [place], [place] [address]   Culture, blood (routine x 2)     Status: None   Collection Time: 01/02/20 10:42 PM   Specimen: BLOOD  Result Value Ref Range Status   Specimen Description BLOOD SITE NOT SPECIFIED  Final   Special Requests   Final    BOTTLES DRAWN AEROBIC AND ANAEROBIC Blood Culture adequate volume   Culture   Final    NO GROWTH 5 DAYS Performed at [facility], [address] [place], [place] [address]    Report Status 01/07/2020 FINAL  Final  Culture, blood (routine x 2)     Status: None (Preliminary result)   Collection Time: 01/04/20  1:12 AM   Specimen: BLOOD LEFT HAND  Result Value Ref Range Status   Specimen Description BLOOD LEFT HAND  Final   Special Requests   Final    BOTTLES  DRAWN AEROBIC ONLY Blood Culture adequate volume   Culture   Final    NO GROWTH 3 DAYS Performed at [facility], [address], [place], [place] [address]    Report Status PENDING  Incomplete     Time coordinating discharge: 35  minutes  SIGNED: Lanae Boast, MD  [facility] 01/07/2020, 1:26 PM  If 7PM-7AM, please contact night-coverage [email or web address]

## 2020-01-07 NOTE — Discharge Instructions (Signed)
[facility] LAPAROSCOPIC SURGERY: POST OP INSTRUCTIONS Always review your discharge instruction sheet given to you by the facility where your surgery was performed. IF YOU HAVE DISABILITY OR FAMILY LEAVE FORMS, YOU MUST BRING THEM TO THE OFFICE FOR PROCESSING.   DO NOT GIVE THEM TO YOUR DOCTOR.  PAIN CONTROL  1. First take acetaminophen (Tylenol) AND/or ibuprofen (Advil) to control your pain after surgery.  Follow directions on package.  Taking acetaminophen (Tylenol) and/or ibuprofen (Advil) regularly after surgery will help to control your pain and lower the amount of prescription pain medication you may need.  You should not take more than 3,000 mg (3 grams) of acetaminophen (Tylenol) in 24 hours.  You should not take ibuprofen (Advil), aleve, motrin, naprosyn or other NSAIDS if you have a history of stomach ulcers or chronic kidney disease.  2. A prescription for pain medication may be given to you upon discharge.  Take your pain medication as prescribed, if you still have uncontrolled pain after taking acetaminophen (Tylenol) or ibuprofen (Advil). 3. Use ice packs to help control pain. 4. If you need a refill on your pain medication, please contact your pharmacy.  They will contact our office to request authorization. Prescriptions will not be filled after 5pm or on week-ends.  HOME MEDICATIONS 5. Take your usually prescribed medications unless otherwise directed.  DIET 6. You should follow a light diet the first few days after arrival home.  Be sure to include lots of fluids daily. Avoid fatty, fried foods.   CONSTIPATION 7. It is common to experience some constipation after surgery and if you are taking pain medication.  Increasing fluid intake and taking a stool softener (such as Colace) will usually help or prevent this problem from occurring.  A mild laxative (Milk of Magnesia or Miralax) should be taken according to package instructions if there are no bowel  movements after 48 hours.  WOUND/INCISION CARE 8. Most patients will experience some swelling and bruising in the area of the incisions.  Ice packs will help.  Swelling and bruising can take several days to resolve.  9. Unless discharge instructions indicate otherwise, follow guidelines below  a. STERI-STRIPS - you may remove your outer bandages 48 hours after surgery, and you may shower at that time.  You have steri-strips (small skin tapes) in place directly over the incision.  These strips should be left on the skin for 7-10 days.   b. DERMABOND/SKIN GLUE - you may shower in 24 hours.  The glue will flake off over the next 2-3 weeks. 10. Any sutures or staples will be removed at the office during your follow-up visit.  ACTIVITIES 11. You may resume regular (light) daily activities beginning the next day--such as daily self-care, walking, climbing stairs--gradually increasing activities as tolerated.  You may have sexual intercourse when it is comfortable.  Refrain from any heavy lifting or straining until approved by your doctor. a. You may drive when you are no longer taking prescription pain medication, you can comfortably wear a seatbelt, and you can safely maneuver your car and apply brakes.  FOLLOW-UP 12. You should see your doctor in the office for a follow-up appointment approximately 2-3 weeks after your surgery.  You should have been given your post-op/follow-up appointment when your surgery was scheduled.  If you did not receive a post-op/follow-up appointment, make sure that you call for this appointment within a day or two after you arrive home to insure a convenient appointment time.     WHEN TO CALL YOUR DOCTOR: 1. Fever over 101.0 2. Inability to urinate 3. Continued bleeding from incision. 4. Increased pain, redness, or drainage from the incision. 5. Increasing abdominal pain  The clinic staff is available to answer your questions during regular business hours.  Please don't  hesitate to call and ask to speak to one of the nurses for clinical concerns.  If you have a medical emergency, go to the nearest emergency room or call 911.  A surgeon from [facility] is always on call at the hospital. [address], [place], [place], [place]  [address] ? [address], [place],    [phone] ? [phone] ? FAX [phone] Web site: [email or web address] ........Marland Kitchen   Managing Your Pain After Surgery Without Opioids    Thank you for participating in our program to help patients manage their pain after surgery without opioids. This is part of our effort to provide you with the best care possible, without exposing you or your family to the risk that opioids pose.  What pain can I expect after surgery? You can expect to have some pain after surgery. This is normal. The pain is typically worse the day after surgery, and quickly begins to get better. Many studies have found that many patients are able to manage their pain after surgery with Over-the-Counter (OTC) medications such as Tylenol and Motrin. If you have a condition that does not allow you to take Tylenol or Motrin, notify your surgical team.  How will I manage my pain? The best strategy for controlling your pain after surgery is around the clock pain control with Tylenol (acetaminophen) and Motrin (ibuprofen or Advil). Alternating these medications with each other allows you to maximize your pain control. In addition to Tylenol and Motrin, you can use heating pads or ice packs on your incisions to help reduce your pain.  How will I alternate your regular strength over-the-counter pain medication? You will take a dose of pain medication every three hours. ; Start by taking 650 mg of Tylenol (2 pills of 325 mg) ; 3 hours later take 600 mg of Motrin (3 pills of 200 mg) ; 3 hours after taking the Motrin take 650 mg of Tylenol ; 3 hours after that take 600 mg of  Motrin.   - 1 -  See example - if your first dose of Tylenol is at 12:00 PM   12:00 PM Tylenol 650 mg (2 pills of 325 mg)  3:00 PM Motrin 600 mg (3 pills of 200 mg)  6:00 PM Tylenol 650 mg (2 pills of 325 mg)  9:00 PM Motrin 600 mg (3 pills of 200 mg)  Continue alternating every 3 hours   We recommend that you follow this schedule around-the-clock for at least 3 days after surgery, or until you feel that it is no longer needed. Use the table on the last page of this handout to keep track of the medications you are taking. Important: Do not take more than 303m of Tylenol or 32048mof Motrin in a 24-hour period. Do not take ibuprofen/Motrin if you have a history of bleeding stomach ulcers, severe kidney disease, &/or actively taking a blood thinner  What if I still have pain? If you have pain that is not controlled with the over-the-counter pain medications (Tylenol and Motrin or Advil) you might have what we call "breakthrough" pain. You will receive a prescription for a small amount of an opioid pain medication such as Oxycodone,  Tramadol, or Tylenol with Codeine. Use these opioid pills in the first 24 hours after surgery if you have breakthrough pain. Do not take more than 1 pill every 4-6 hours.  If you still have uncontrolled pain after using all opioid pills, don't hesitate to call our staff using the number provided. We will help make sure you are managing your pain in the best way possible, and if necessary, we can provide a prescription for additional pain medication.   Day 1    Time  Name of Medication Number of pills taken  Amount of Acetaminophen  Pain Level   Comments  AM PM       AM PM       AM PM       AM PM       AM PM       AM PM       AM PM       AM PM       Total Daily amount of Acetaminophen Do not take more than  3,000 mg per day      Day 2    Time  Name of Medication Number of pills taken  Amount of Acetaminophen  Pain Level   Comments  AM  PM       AM PM       AM PM       AM PM       AM PM       AM PM       AM PM       AM PM       Total Daily amount of Acetaminophen Do not take more than  3,000 mg per day      Day 3    Time  Name of Medication Number of pills taken  Amount of Acetaminophen  Pain Level   Comments  AM PM       AM PM       AM PM       AM PM          AM PM       AM PM       AM PM       AM PM       Total Daily amount of Acetaminophen Do not take more than  3,000 mg per day      Day 4    Time  Name of Medication Number of pills taken  Amount of Acetaminophen  Pain Level   Comments  AM PM       AM PM       AM PM       AM PM       AM PM       AM PM       AM PM       AM PM       Total Daily amount of Acetaminophen Do not take more than  3,000 mg per day      Day 5    Time  Name of Medication Number of pills taken  Amount of Acetaminophen  Pain Level   Comments  AM PM       AM PM       AM PM       AM PM       AM PM       AM PM       AM PM  AM PM       Total Daily amount of Acetaminophen Do not take more than  3,000 mg per day       Day 6    Time  Name of Medication Number of pills taken  Amount of Acetaminophen  Pain Level  Comments  AM PM       AM PM       AM PM       AM PM       AM PM       AM PM       AM PM       AM PM       Total Daily amount of Acetaminophen Do not take more than  3,000 mg per day      Day 7    Time  Name of Medication Number of pills taken  Amount of Acetaminophen  Pain Level   Comments  AM PM       AM PM       AM PM       AM PM       AM PM       AM PM       AM PM       AM PM       Total Daily amount of Acetaminophen Do not take more than  3,000 mg per day        For additional information about how and where to safely dispose of unused opioid medications - [email or web address]  Disclaimer: This document contains information and/or instructional materials adapted from [facility]  for the typical patient with your condition. It does not replace medical advice from your health care provider because your experience may differ from that of the typical patient. Talk to your health care provider if you have any questions about this document, your condition or your treatment plan. Adapted from [facility]      JP Drain Totals  Bring this sheet to all of your post-operative appointments while you have your drains.  Please measure your drains by CC's or ML's.  Make sure you drain and measure your JP Drains 3 times per day.  At the end of each day, add up totals for the left side and add up totals for the right side.    ( 9 am )     ( 3 pm )        ( 9 pm )                Date L  R  L  R  L  R  Total L/R

## 2020-01-07 NOTE — Progress Notes (Signed)
Patient ID: Brandy Hicks, female   DOB: Sep 25, 1932, 86 y.o.   MRN: [id number]    4 Days Post-Op  Subjective: Eager to go home.  Pain controlled.  Tolerating a diet.  No nausea.  Moving her bowels.  ROS: See above, otherwise other systems negative  Objective: Vital signs in last 24 hours: Temp:  [98.2 F (36.8 C)-99.1 F (37.3 C)] 98.2 F (36.8 C) (10/23 0451) Pulse Rate:  [59-70] 59 (10/23 0451) Resp:  [16-18] 17 (10/23 0451) BP: (138-171)/(49-66) 143/55 (10/23 0451) SpO2:  [95 %-99 %] 97 % (10/23 0451) Last BM Date: 01/06/20  Intake/Output from previous day: 10/22 0701 - 10/23 0700 In: 150 [P.O.:150] Out: 500 [Drains:500] Intake/Output this shift: No intake/output data recorded.  PE: Abd: soft, appropriately tender, mild bloating, +BS, incisions c/d/i, JP drain with serous output  Lab Results:  Recent Labs    01/06/20 0118 01/07/20 0321  WBC 12.5* 10.1  HGB 10.8* 11.1*  HCT 33.0* 33.4*  PLT 263 330   BMET Recent Labs    01/06/20 0118 01/07/20 0321  NA 135 138  K 3.6 3.6  CL 107 110  CO2 20* 17*  GLUCOSE 106* 114*  BUN 14 15  CREATININE 1.19* 1.07*  CALCIUM 8.2* 8.1*   PT/INR No results for input(s): LABPROT, INR in the last 72 hours. CMP     Component Value Date/Time   NA 138 01/07/2020 0321   K 3.6 01/07/2020 0321   CL 110 01/07/2020 0321   CO2 17 (L) 01/07/2020 0321   GLUCOSE 114 (H) 01/07/2020 0321   BUN 15 01/07/2020 0321   CREATININE 1.07 (H) 01/07/2020 0321   CALCIUM 8.1 (L) 01/07/2020 0321   PROT 5.9 (L) 01/04/2020 0112   ALBUMIN 2.9 (L) 01/04/2020 0112   AST 50 (H) 01/04/2020 0112   ALT 37 01/04/2020 0112   ALKPHOS 45 01/04/2020 0112   BILITOT 0.7 01/04/2020 0112   GFRNONAA 51 (L) 01/07/2020 0321   GFRAA 59 (L) 04/20/2019 0432   Lipase     Component Value Date/Time   LIPASE 31 01/02/2020 1519       Studies/Results: NM Hepato W/EF  Result Date: 01/06/2020 CLINICAL DATA:  Recurrent severe RIGHT upper quadrant  abdominal pain post cholecystectomy question bile leak EXAM: NUCLEAR MEDICINE HEPATOBILIARY IMAGING TECHNIQUE: Sequential images of the abdomen were obtained out to 60 minutes following intravenous administration of radiopharmaceutical. RADIOPHARMACEUTICALS:  4.9 mCi Tc-7m  Choletec IV COMPARISON:  None FINDINGS: Prompt uptake and biliary excretion of activity by the liver is seen. Normal excretion of tracer into biliary tree with visualization of small-bowel by 15 minutes. No abnormal hepatic retention of tracer. Gallbladder surgically absent. No abnormal perihepatic tracer accumulation to suggest bile leak. IMPRESSION: Normal hepatobiliary imaging post cholecystectomy. No evidence of bile leak or biliary obstruction. Electronically Signed   By: Ulyses Southward M.D.   On: 01/06/2020 13:16    Anti-infectives: Anti-infectives (From admission, onward)   Start     Dose/Rate Route Frequency Ordered Stop   01/03/20 0300  piperacillin-tazobactam (ZOSYN) IVPB 3.375 g       "Followed by" Linked Group Details   3.375 g 12.5 mL/hr over 240 Minutes Intravenous Every 8 hours 01/02/20 2040     01/02/20 2045  piperacillin-tazobactam (ZOSYN) IVPB 3.375 g       "Followed by" Linked Group Details   3.375 g 100 mL/hr over 30 Minutes Intravenous  Once 01/02/20 2040 01/03/20 0035   01/02/20 1800  cefTRIAXone (ROCEPHIN) 2  g in sodium chloride 0.9 % 100 mL IVPB        2 g 200 mL/hr over 30 Minutes Intravenous  Once 01/02/20 1751 01/02/20 1932       Assessment/Plan HTN HLD Hypothyroidism Hx of TIA Hx of Guillain Barre Essential tremor Depression  Acute gangrenous cholecystitis  S/P laparoscopic cholecystectomy 01/03/20 Dr. Luisa Hart - POD#4 - continue lidoderm patch, scheduled APAP and robaxin, PRN tramadol for pain - HIDA negative for leak.  Drain just serous.  Leave and remove in clinic next week - ok to transition to augmentin for 3 more days to complete 7 days total -patient is surgically stable for DC  home.  Primary service and I in room together with patient this am. -follow up arranged and narcotics sent to pharmacy for patient  FEN: HH diet VTE: Lovenox ID: rocephin 10/18; Zosyn 10/18>>   LOS: 4 days    Letha Cape , [facility] 01/07/2020, 9:09 AM Please see Amion for pager number during day hours 7:00am-4:30pm or 7:00am -11:30am on weekends

## 2020-01-07 NOTE — Progress Notes (Signed)
Discharge home. Home dischrage instruction given, no question verbalized.

## 2020-01-07 NOTE — Progress Notes (Signed)
Occupational Therapy Evaluation  Pt lives alone in Independent Living facility however her daughter will be able to assist as needed after DC. Educated pt on compensatory strategies for ADL and reducing risk of falls. Educated on recommendations for AE/DME to maximize independence with ADL and reduce pain. Pt verbalized understanding. No further OT needs.     01/07/20 1300  OT Visit Information  Last OT Received On 01/07/20  Assistance Needed +1  History of Present Illness Pt is an 86 y/o female admitted secondary to acute cholecystitis. Pt is s/p laparascopic cholecystectomy. PMH includes HTN and HLD.   Precautions  Precautions Fall  Precaution Comments jp drain  Home Living  Family/patient expects to be discharged to: Private residence  Living Arrangements Alone  Available Help at Discharge Family;Available 24 hours/day  Type of Home Independent living facility  Home Access Level entry  Home Layout One level  Bathroom Shower/Tub Walk-in shower  Bathroom Toilet Handicapped height  Bathroom Accessibility Yes  How Accessible Accessible via walker  Home Equipment Grab bars - tub/shower;Grab bars - toilet;Walker - 2 wheels  Additional Comments Daughter is picking up a shower seat  Prior Function  Level of Independence Independent  Comments drives; enjoys playing bridge adn runicube  Communication  Communication No difficulties  Pain Assessment  Pain Assessment Faces  Faces Pain Scale 4  Pain Location abdomen acute and chronic R shoulder/collarbone pain  Pain Descriptors / Indicators Aching  Pain Intervention(s) Limited activity within patient's tolerance  Cognition  Arousal/Alertness Awake/alert  Behavior During Therapy WFL for tasks assessed/performed  Overall Cognitive Status Within Functional Limits for tasks assessed  Upper Extremity Assessment  Upper Extremity Assessment Generalized weakness  Lower Extremity Assessment  Lower Extremity Assessment Defer to PT evaluation   Cervical / Trunk Assessment  Cervical / Trunk Assessment Other exceptions (s/p surgery)  ADL  Overall ADL's  Needs assistance/impaired  Grooming Set up;Sitting  Upper Body Bathing Set up;Sitting  Lower Body Bathing Minimal assistance;Sit to/from stand  Upper Body Dressing  Supervision/safety;Set up;Sitting  Lower Body Dressing Minimal assistance;Sit to/from Scientist, research (life sciences) Ambulation;Min guard  Toileting- Clothing Manipulation and Hygiene Set up;Supervision/safety  Functional mobility during ADLs Min guard  General ADL Comments Educated on compensatory strategies for ADL adn use of AE/DME to decrease papin and maximize functional level of independence. Educated on strateiges to reduce risk of falls; recommend pt use shower chair and long handled sponge. Pt's daughter is getting her a shower chair  Bed Mobility  Overal bed mobility Needs Assistance  Supine to sit Mod assist  General bed mobility comments educated on log rolling. Pt staets it "helps when you push from the back"  General Comments  General comments (skin integrity, edema, etc.) Pt fatigued from not sleeping well adn being up previously  OT - End of Session  Activity Tolerance Patient limited by fatigue  Patient left in bed;with call bell/phone within reach  Nurse Communication Mobility status  OT Assessment  OT Recommendation/Assessment Patient does not need any further OT services  OT Visit Diagnosis Pain  Pain - part of body  (abdomen)  OT Problem List Decreased activity tolerance;Decreased strength;Decreased knowledge of use of DME or AE;Obesity;Pain  AM-PAC OT "6 Clicks" Daily Activity Outcome Measure (Version 2)  Help from another person eating meals? 4  Help from another person taking care of personal grooming? 3  Help from another person toileting, which includes using toliet, bedpan, or urinal? 3  Help from another person bathing (including washing, rinsing, drying)?  3  Help from another person to put  on and taking off regular upper body clothing? 3  Help from another person to put on and taking off regular lower body clothing? 2  6 Click Score 18  OT Recommendation  Follow Up Recommendations No OT follow up;Supervision/Assistance - 24 hour (initially)  OT Equipment Tub/shower seat (daughter is getting )  Acute Rehab OT Goals  Patient Stated Goal to go home  OT Goal Formulation All assessment and education complete, DC therapy  OT Time Calculation  OT Start Time (ACUTE ONLY) 1338  OT Stop Time (ACUTE ONLY) 1400  OT Time Calculation (min) 22 min  OT General Charges  $OT Visit 1 Visit  OT Evaluation  $OT Eval Low Complexity 1 Low  Written Expression  Dominant Hand Right  Luisa Dago, OT/L   Acute OT Clinical Specialist Acute Rehabilitation Services Pager [phone] Office [phone]

## 2020-01-09 LAB — CULTURE, BLOOD (ROUTINE X 2)
Culture: NO GROWTH
Special Requests: ADEQUATE

## 2020-01-24 ENCOUNTER — Other Ambulatory Visit: Payer: Self-pay

## 2020-01-24 ENCOUNTER — Other Ambulatory Visit: Payer: Self-pay | Admitting: Student

## 2020-01-24 ENCOUNTER — Ambulatory Visit
Admission: RE | Admit: 2020-01-24 | Discharge: 2020-01-24 | Disposition: A | Discharge status: Home / Self Care | Payer: Medicare Other | Source: Ambulatory Visit | Attending: Student | Admitting: Student

## 2020-01-24 DIAGNOSIS — R5383 Other fatigue: Secondary | ICD-10-CM

## 2020-01-26 ENCOUNTER — Ambulatory Visit: Payer: Medicare Other | Admitting: Sports Medicine

## 2020-01-31 ENCOUNTER — Ambulatory Visit (INDEPENDENT_AMBULATORY_CARE_PROVIDER_SITE_OTHER): Payer: Medicare Other | Admitting: Orthopedic Surgery

## 2020-01-31 ENCOUNTER — Other Ambulatory Visit: Payer: Self-pay

## 2020-01-31 ENCOUNTER — Encounter: Payer: Self-pay | Admitting: Orthopedic Surgery

## 2020-01-31 VITALS — BP 128/80 | HR 57 | Temp 97.9°F | Resp 20 | Ht 62.0 in | Wt 144.8 lb

## 2020-01-31 DIAGNOSIS — Z8669 Personal history of other diseases of the nervous system and sense organs: Secondary | ICD-10-CM | POA: Diagnosis not present

## 2020-01-31 DIAGNOSIS — L219 Seborrheic dermatitis, unspecified: Secondary | ICD-10-CM

## 2020-01-31 DIAGNOSIS — Z87891 Personal history of nicotine dependence: Secondary | ICD-10-CM

## 2020-01-31 DIAGNOSIS — E7800 Pure hypercholesterolemia, unspecified: Secondary | ICD-10-CM

## 2020-01-31 DIAGNOSIS — M5414 Radiculopathy, thoracic region: Secondary | ICD-10-CM

## 2020-01-31 DIAGNOSIS — I10 Essential (primary) hypertension: Secondary | ICD-10-CM | POA: Diagnosis not present

## 2020-01-31 DIAGNOSIS — G250 Essential tremor: Secondary | ICD-10-CM

## 2020-01-31 DIAGNOSIS — E039 Hypothyroidism, unspecified: Secondary | ICD-10-CM

## 2020-01-31 DIAGNOSIS — F329 Major depressive disorder, single episode, unspecified: Secondary | ICD-10-CM

## 2020-01-31 DIAGNOSIS — Z889 Allergy status to unspecified drugs, medicaments and biological substances status: Secondary | ICD-10-CM

## 2020-01-31 DIAGNOSIS — K810 Acute cholecystitis: Secondary | ICD-10-CM

## 2020-01-31 DIAGNOSIS — Z9181 History of falling: Secondary | ICD-10-CM

## 2020-01-31 NOTE — Progress Notes (Signed)
Careteam: Patient Care Team: [name], MD as PCP - General (Family Medicine)  Seen by: Hazle Nordmann, AGNP-C  PLACE OF SERVICE:  [facility]  Advanced Directive information    Allergies  Allergen Reactions  . Hydroquinone     Pinkness and edema of face and eyelids, severe    No chief complaint on file.    HPI: Patient is a 86 y.o. female seen today to establish care at [facility].   She has lived in [place] for the past 20 years to be close to her children.Originally from [place]. Retired from [employer] in 1994.  Lives at [facility], independent living.   Dr. Nadyne Coombes was her previous PCP. Upset she did not catch her infected gallbladder and wanted to switch providers.   On 01/02/2020 she presented to [facility] with intense abdominal and back pain. She was diagnosed with acute cholecystitis and underwent a laparoscopic cholecystectomy 01/03/2020. She followed up with her surgeon, Dr. Luisa Hart last week. No future follow ups were recommended. At this time she feels like she is still recovering. She is not driving because the seat belt is still uncomfortable. Not taking anything for pain. Having normal bowel movements. Following a regular diet.   Guillain-Barre was diagnosed around age 61. She was living in [place] during time of incident. Cannot state if it was triggered by bacterial or viral infection. She cannot recall the incident, but knows she was taken to 3 different hospitals over a 1-2 month hospitalization.  She was unable to walk after incident and had months of PT to help her ambulate again. She currently ambulates with no assistive device. Denies any recent reoccurrence. Does not get annual flu vaccinations due to this diagnosis.   Hypertension- cannot recall when she was diagnosed.Takes chlorthalidone,losartan, and propranolol meds daily. Will sometimes check bp at home. Does not follow a low sodium diet. Denies blurred  vision, headaches, or chest pain.   Hyperlipidemia-cannot recall when diagnosed. Takes lipitor daily. Does not follow low fat diet. Denies dark urine or muscle aches.   Essential Tremor- she has been struggling with it for over 20 years. Takes primidone daily. Has trouble with fine motor skills. States she cannot write well. She can feed herself.   Depression- states she has been taking zoloft for a few years due to her husband passing and daughter having health issues. Husband passed 3 years ago. She becomes tearful while discussing it. Sleeping well with melatonin. Questions if medication is working at times.   Hypothyroidism- cannot recall she was diagnosed. Takes levothyroxine every morning, sometimes not on a empty stomach. Feels very fatigued at times but thinks she is bouncing back from recent surgery.   Lower back and shoulder pain began earlier this year. Her previous provider gave her gabapentin, tylenol, tramadol, and referral to see Dr. Ethelene Hal. Her back pain subsided after her gallbladder was removed. After surgery she stopped taking gabapentin, tramadol and did not follow through with referral. Will take tylenol PRN for pain.   She has had seasonal allergies for years. Takes Flonase and benadryl daily.   Seborrheic dermatitis- she gets dry, itchy areas on her body.  Believes hairspray is a trigger. Has tried betamethasone and ketoconazole creams with some success.   Past smoker. She began smoking at the age of 18 and quit at the age of 67. Smoked 2 packs a day.   Diet/appetite - she eats two meals a day. Drinks 1 cup of  coffee daily. Occassional pepsi. No alcohol. Drinks about 2 20-30oz water bottles daily.   Larey SeatFell in May 2021- fractured collar bone, kept arm in sling for a few weeks. Did not require surgery. Does not use anything to ambulate. Has been told in the past she has osteoporosis. Takes vitamin D daily. Does not take calcium because it causes constipation.   Still drives,  not driving right now after surgery.   Eyes were exaimned last year by Dr. Vilinda BlanksJeffery Gould. Denies trouble seeing with corrective lenses.   Dental exam was completed this year. Denies any dental issues.   Does not get colonscopies, mammograms, pap smears anymore.   Walks daily for exercise, thinks its about 3 football field lengths. Walks vary in length of time.   Completed Moderna covid-19 vaccinations. Will not get booster vaccination due to GuillainIrven Easterly- Barre.     Review of Systems:  Review of Systems  Constitutional: Positive for malaise/fatigue. Negative for weight loss.          HENT: Negative for congestion, hearing loss and sore throat.        Corrective lenses, dry eyes, dry mouth  Respiratory: Negative for shortness of breath and wheezing.   Cardiovascular: Negative for palpitations and leg swelling.  Gastrointestinal: Negative for diarrhea, nausea and vomiting.       Hemorrhoids, recent gallbladder surgery  Genitourinary: Negative for dysuria and hematuria.  Musculoskeletal: Positive for back pain.  Neurological: Positive for tremors.  Endo/Heme/Allergies:       Cold intolerance   Psychiatric/Behavioral: Positive for depression.    Past Medical History:  Diagnosis Date  . Chronic kidney disease, stage 3 unspecified (HCC)   . Chronic kidney disease, unspecified   . Constipation, unspecified   . Depression   . Essential hypertension   . Essential tremor   . Guillain Barr syndrome (HCC)   . Hormone replacement therapy   . Hyperlipidemia   . Hypertension   . Hypothyroid   . Hypothyroidism, unspecified   . Impacted cerumen of both ears   . Impacted cerumen of right ear   . Osteoporosis   . Ovarian cyst   . Radiculopathy, thoracic region   . Retention of urine, unspecified   . Rosacea   . Seborrheic dermatitis, unspecified   . Somnolence   . Thoracic spine pain   . TIA (transient ischemic attack)   . Tremor   . Unspecified mood (affective) disorder [facility](HCC)      Past Surgical History:  Procedure Laterality Date  . CHOLECYSTECTOMY N/A 01/03/2020   Procedure: LAPAROSCOPIC CHOLECYSTECTOMY;  Surgeon: Harriette Bouillonornett, Thomas, MD;  Location: [facility] OR;  Service: General;  Laterality: N/A;  . FACIAL COSMETIC SURGERY    . GALLBLADDER SURGERY     Post [place] Gallbladder Removal   . HEEL SPUR SURGERY    . HEEL SPUR SURGERY    . TONSILLECTOMY     Social History:   reports that she has quit smoking. She has never used smokeless tobacco. She reports that she does not drink alcohol and does not use drugs.  Family History  Problem Relation Age of Onset  . Stroke Mother   . Hypertension Father   . CAD Father   . Prostate cancer Father   . Liver disease Sister   . Hypertension Daughter   . Thyroid disease Daughter   . Breast cancer Daughter   . Heart murmur Daughter   . Hypertension Daughter   . Multiple sclerosis Daughter   . Kidney disease Daughter  Medications: Patient's Medications  New Prescriptions   No medications on file  Previous Medications   AMLODIPINE (NORVASC) 10 MG TABLET    Take 10 mg by mouth daily.   ASPIRIN 81 MG TABLET    Take 81 mg by mouth daily.   ATORVASTATIN (LIPITOR) 40 MG TABLET    Take 40 mg by mouth daily.   BETAMETHASONE DIPROPIONATE 0.05 % CREAM    Apply 1 application topically 2 (two) times daily.   CHLORTHALIDONE (HYGROTON) 25 MG TABLET    Take 1 tablet (25 mg total) by mouth daily.   CHOLECALCIFEROL (VITAMIN D) 125 MCG (5000 UT) CAPS    Take 1 capsule by mouth daily.   DIPHENHYDRAMINE (BENADRYL) 25 MG CAPSULE    Take 25 mg by mouth at bedtime.   FLUTICASONE (FLONASE) 50 MCG/ACT NASAL SPRAY    Place 2 sprays into both nostrils daily.   LEVOTHYROXINE (SYNTHROID) 137 MCG TABLET    Take 137 mcg by mouth daily.   LOSARTAN (COZAAR) 100 MG TABLET    Take 100 mg by mouth daily.   MELATONIN 10 MG TABS    Take 10 mg by mouth at bedtime.    MULTIPLE VITAMINS-MINERALS (MULTIVITAMIN ADULT PO)    Take 1 tablet by mouth daily.    PRIMIDONE (MYSOLINE) 250 MG TABLET    Take 250 mg by mouth 2 (two) times daily.   PROPRANOLOL ER (INDERAL LA) 60 MG 24 HR CAPSULE    TAKE ONE CAPSULE BY MOUTH EVERY NIGHT AT BEDTIME   SERTRALINE (ZOLOFT) 50 MG TABLET    Take 50 mg by mouth daily.    VITAMIN B-12 (CYANOCOBALAMIN) 1000 MCG TABLET    Take 1,000 mcg by mouth daily.  Modified Medications   No medications on file  Discontinued Medications   No medications on file    Physical Exam:  There were no vitals filed for this visit. There is no height or weight on file to calculate BMI. Wt Readings from Last 3 Encounters:  04/19/19 138 lb 14.2 oz (63 kg)  07/22/16 148 lb 6.4 oz (67.3 kg)  07/30/15 130 lb 9.6 oz (59.2 kg)    Physical Exam Vitals reviewed.  Constitutional:      Appearance: Normal appearance. She is normal weight.  HENT:     Head: Normocephalic.     Right Ear: Tympanic membrane normal. There is no impacted cerumen.     Left Ear: Tympanic membrane normal. There is impacted cerumen.     Mouth/Throat:     Mouth: Mucous membranes are moist.     Pharynx: No posterior oropharyngeal erythema.  Eyes:     Extraocular Movements: Extraocular movements intact.     Pupils: Pupils are equal, round, and reactive to light.  Neck:     Thyroid: No thyroid mass or thyromegaly.  Cardiovascular:     Rate and Rhythm: Normal rate and regular rhythm.     Pulses: Normal pulses.     Heart sounds: Normal heart sounds. No murmur heard.   Pulmonary:     Effort: Pulmonary effort is normal. No respiratory distress.     Breath sounds: Normal breath sounds. No wheezing.  Abdominal:     General: Abdomen is flat. Bowel sounds are normal. There is no distension.     Palpations: Abdomen is soft.     Tenderness: There is no abdominal tenderness.    Musculoskeletal:     Cervical back: Full passive range of motion without pain.     Right  lower leg: No edema.     Left lower leg: No edema.  Lymphadenopathy:     Cervical: No cervical  adenopathy.  Skin:    General: Skin is warm and dry.     Capillary Refill: Capillary refill takes less than 2 seconds.  Neurological:     General: No focal deficit present.     Mental Status: She is alert and oriented to person, place, and time. Mental status is at baseline.     Sensory: Sensation is intact.     Motor: Tremor present.     Coordination: Coordination is intact.     Gait: Gait is intact.  Psychiatric:        Mood and Affect: Mood normal.        Behavior: Behavior normal.        Thought Content: Thought content normal.        Judgment: Judgment normal.     Labs reviewed: Basic Metabolic Panel: Recent Labs    01/02/20 1519 01/02/20 1519 01/03/20 0533 01/04/20 0112 01/05/20 0232 01/06/20 0118 01/07/20 0321  NA 135   < > 135   < > 135 135 138  K 3.8   < > 3.6   < > 3.4* 3.6 3.6  CL 99   < > 100   < > 104 107 110  CO2 23   < > 25   < > 24 20* 17*  GLUCOSE 154*   < > 126*   < > 121* 106* 114*  BUN 18   < > 15   < > CREATININE 1.17*   < > 1.03*   < > 1.18* 1.19* 1.07*  CALCIUM 8.9   < > 8.5*   < > 7.9* 8.2* 8.1*  MG 1.6*  --  1.9  --   --   --   --    < > = values in this interval not displayed.   Liver Function Tests: Recent Labs    01/02/20 1519 01/03/20 0533 01/04/20 0112  AST 32 37 50*  ALT 24 28 37  ALKPHOS 73 63 45  BILITOT 0.5 0.9 0.7  PROT 6.7 6.5 5.9*  ALBUMIN 3.6 3.2* 2.9*   Recent Labs    04/19/19 0118 01/02/20 1519  LIPASE 23 31   No results for input(s): AMMONIA in the last 8760 hours. CBC: Recent Labs    04/19/19 0118 04/19/19 0829 04/20/19 0432 01/02/20 1259 01/03/20 0533 01/04/20 0112 01/05/20 0232 01/06/20 0118 01/07/20 0321  WBC 19.1*   < > 11.0*   < > 13.3*   < > 12.8* 12.5* 10.1  NEUTROABS 15.5*  --  6.4  --  10.3*  --   --   --   --   HGB 12.8   < > 11.9*   < > 12.5   < > 10.4* 10.8* 11.1*  HCT 38.1   < > 35.1*   < > 36.8   < > 31.1* 33.0* 33.4*  MCV 94.5   < > 96.2   < > 97.6   < > 98.4 98.5 99.4  PLT  290   < > 241   < > 288   < > 207 263 330   < > = values in this interval not displayed.   Lipid Panel: No results for input(s): CHOL, HDL, LDLCALC, TRIG, CHOLHDL, LDLDIRECT in the last 8760 hours. TSH: No results for input(s): TSH in the last 8760 hours. A1C: No results  found for: HGBA1C   Assessment/Plan 1. Essential hypertension, benign - stable, bp at goal< 150/90, continue current medication regimen - cbc/diff- future - cmp- future  2. History of Guillain-Barre syndrome - stable at this time, unknown if it was caused by bacterial or viral infection  3. Essential tremor - stable at this time with medication - continue primidone regimen  4. Thoracic radiculopathy - resolved after lap chole - continue tylenol prn for minor back pain  5. Seborrheic dermatitis - stable at this time, suspect dry skin as main cause - continue to avoid triggers like hairspray - recommend applying lotion after showering - recommend using lotions that contain ceramides like cerave  6. Acute cholecystitis - resolved at this time, surgical site clean and free of infection, bowel function and appetite have returned to normal - followed by Dr. Luisa Hart  7. H/O seasonal allergies - ongoing, with use of medication - discontinue benadryl  - recommend starting OTC clairitn or zyrtec - continue using flonase as directed  8. History of fall - stable at this time, no fall with injury in past 3 months - past medical records indicate osteoporosis, will f/u to obtain last bone density study - patient refuses to take calcium at this time due to constipation - continue taking vitamin D 2000 units daily - falls prevention plan discussed with patient  9. High cholesterol - stable at this time with statin - lipid panel- future  10. Acquired hypothyroidism - stable at this time, suspect fatigue is from recent surgery - continue current levothyroxine regimen - patient educated about taking medication  on empty stomach - TSH- future  11. History of smoking - quit at age 89, does not qualify for low dose CT scan at this time - chest x-ray negative for nodules or cardiopulmonary disease 01/02/2020  12. Reactive depression -stable, she is still very tearful when discussing her husband - continue zoloft regimen - recommend 20-30 min sunlight daily   Next appt: 04/03/2020 Hazle Nordmann, AGNP-C  Labs: cbc/diff, cmp, lipid panel, TSH- future  [facility] [phone]

## 2020-01-31 NOTE — Patient Instructions (Signed)
Psoriasis Psoriasis is a long-term (chronic) skin condition. It occurs because your body's defense system (immune system) causes skin cells to form too quickly. This causes raised, red patches (plaques) on your skin that look silvery. The patches may be on all areas of your body. They can be any size or shape. Psoriasis can come and go. It can range from mild to very bad. It cannot be passed from one person to another (is not contagious). There is no cure for this condition, but it can be helped with treatment. What are the causes? The cause of psoriasis is not known. Some things can make it worse. These are:  Skin damage, such as cuts, scrapes, sunburn, and dryness.  Not getting enough sunlight.  Some medicines.  Alcohol.  Tobacco.  Stress.  Infections. What increases the risk?  Having a family member with psoriasis.  Being very overweight (obese).  Being 20-40 years old.  Taking certain medicines. What are the signs or symptoms? There are different types of psoriasis. The types are:  Plaque. This is the most common. Symptoms include red, raised patches with a silvery coating. These may be itchy. Your nails may be crumbly or fall off.  Guttate. Symptoms include small red spots on your stomach area, arms, and legs. These may happen after you have been sick, such as with strep throat.  Inverse. Symptoms include patches in your armpits, under your breasts, private areas, or on your butt.  Pustular. Symptoms include pus-filled bumps on the palms of your hands or the soles of your feet. You also may feel very tired, weak, have a fever, and not be hungry.  Erythrodermic. Symptoms include bright red skin that looks burned. You may have a fast heartbeat and a body temperature that is too high or too low. You may be itchy or in pain.  Sebopsoriasis. Symptoms include red patches on your scalp, forehead, and face that are greasy.  Psoriatic arthritis. Symptoms include swollen,  painful joints along with scaly skin patches. How is this treated? There is no cure for this condition, but treatment can:  Help your skin heal.  Lessen itching and irritation and swelling (inflammation).  Slow the growth of new skin cells.  Help your body's defense system respond better to your skin. Treatment may include:  Creams or ointments.  Light therapy. This may include natural sunlight or light therapy in a doctor's office.  Medicines. These can help your body better manage skin cells. They may be used with light therapy or ointments. Medicines may include pills or injections. You may also get antibiotic medicines if you have an infection. Follow these instructions at home: Skin Care  Apply lotion to your skin as needed. Only use those that your doctor has said are okay.  Apply cool, wet cloths (cold compresses) to the affected areas.  Do not use a hot tub or take hot showers. Use slightly warm, not hot, water when taking showers and baths.  Do not scratch your skin. Lifestyle   Do not use any products that contain nicotine or tobacco, such as cigarettes, e-cigarettes, and chewing tobacco. If you need help quitting, ask your doctor.  Lower your stress.  Keep a healthy weight.  Go out in the sun as told by your doctor. Do not get sunburned.  Join a support group. Medicines  Take or use over-the-counter and prescription medicines only as told by your doctor.  If you were prescribed an antibiotic medicine, take it as told by your doctor.   Do not stop using the antibiotic even if you start to feel better. Alcohol use If you drink alcohol:  Limit how much you use: ? 0-1 drink a day for women. ? 0-2 drinks a day for men.  Be aware of how much alcohol is in your drink. In the [place] one drink equals one 12 oz bottle of beer (355 mL), one 5 oz glass of wine (148 mL), or one 1 oz glass of hard liquor (44 mL). General instructions  Keep a journal to track the  things that cause symptoms (triggers). Try to avoid these things.  See a counselor if you feel the support would help.  Keep all follow-up visits as told by your doctor. This is important. Contact a doctor if:  You have a fever.  Your pain gets worse.  You have more redness or warmth in the affected areas.  You have new or worse pain or stiffness in your joints.  Your nails start to break easily or pull away from the nail bed.  You feel very sad (depressed). Summary  Psoriasis is a long-term (chronic) skin condition.  There is no cure for this condition, but treatment can help manage it.  Keep a journal to track the things that cause symptoms.  Take or use over-the-counter and prescription medicines only as told by your doctor.  Keep all follow-up visits as told by your doctor. This is important. This information is not intended to replace advice given to you by your health care provider. Make sure you discuss any questions you have with your health care provider. Document Revised: 01/05/2018 Document Reviewed: 01/05/2018 Elsevier Patient Education  2020 [employer].    May purchase Cerave to moisten skin. Apply after showering.   May purchase debrox drops over the counter for wax buildup.   STOP benadryl and start claritin or zyrtec

## 2020-03-08 ENCOUNTER — Encounter: Payer: Self-pay | Admitting: Sports Medicine

## 2020-03-08 ENCOUNTER — Ambulatory Visit (INDEPENDENT_AMBULATORY_CARE_PROVIDER_SITE_OTHER): Payer: Medicare Other | Admitting: Sports Medicine

## 2020-03-08 ENCOUNTER — Other Ambulatory Visit: Payer: Self-pay

## 2020-03-08 DIAGNOSIS — G610 Guillain-Barre syndrome: Secondary | ICD-10-CM | POA: Diagnosis not present

## 2020-03-08 DIAGNOSIS — M79674 Pain in right toe(s): Secondary | ICD-10-CM | POA: Diagnosis not present

## 2020-03-08 DIAGNOSIS — M79675 Pain in left toe(s): Secondary | ICD-10-CM

## 2020-03-08 DIAGNOSIS — B351 Tinea unguium: Secondary | ICD-10-CM

## 2020-03-08 NOTE — Progress Notes (Signed)
Subjective: Brandy Hicks is a 86 y.o. female patient seen today in office with complaint of painful thickened toenails; unable to trim. Patient denies any changes since last encounter.  Patient has no other pedal complaints at this time.     Patient Active Problem List   Diagnosis Date Noted  . Cholecystitis, acute 01/03/2020  . Acute cholecystitis 01/02/2020  . Hypomagnesemia 01/02/2020  . Hyperkalemia 01/02/2020  . Mixed hyperlipidemia 01/02/2020  . Hypothyroidism 01/02/2020  . Chest pain 01/02/2020  . Lung infiltrate on CT 01/02/2020  . Fecal impaction (HCC) 04/19/2019  . Acute urinary retention 04/19/2019  . Leukocytosis 04/19/2019  . Urinary retention 04/19/2019  . Hyponatremia 04/19/2019  . Hypokalemia 04/19/2019  . Voice tremor 10/13/2017  . GBS (Guillain-Barre syndrome) (HCC) 07/31/2015  . History of Guillain-Barre syndrome 05/02/2015  . Anxiety state 05/02/2015  . Panic attack 05/02/2015  . Essential tremor 05/02/2015  . Altered mental status   . UTI (urinary tract infection) 04/22/2015  . Acute kidney injury (HCC) 04/22/2015  . Essential hypertension, benign 11/28/2013  . Depression 04/09/2011  . Insomnia 04/09/2011    Current Outpatient Medications on File Prior to Visit  Medication Sig Dispense Refill  . amLODipine (NORVASC) 10 MG tablet Take 10 mg by mouth daily.    Marland Kitchen aspirin 81 MG tablet Take 81 mg by mouth daily.    Marland Kitchen atorvastatin (LIPITOR) 40 MG tablet Take 40 mg by mouth daily.    . betamethasone dipropionate 0.05 % cream Apply 1 application topically 2 (two) times daily.    . chlorthalidone (HYGROTON) 25 MG tablet Take 1 tablet (25 mg total) by mouth daily.  0  . Cholecalciferol (VITAMIN D) 125 MCG (5000 UT) CAPS Take 1 capsule by mouth daily.    . fluconazole (DIFLUCAN) 150 MG tablet Take 150 mg by mouth once.    Marland Kitchen FLUoxetine (PROZAC) 20 MG tablet Take 1 tablet by mouth daily.    . fluticasone (FLONASE) 50 MCG/ACT nasal spray Place 2 sprays  into both nostrils daily.    Marland Kitchen gabapentin (NEURONTIN) 100 MG capsule Take 100 mg by mouth at bedtime.    Marland Kitchen ketoconazole (NIZORAL) 2 % cream Apply topically 2 (two) times daily.    Marland Kitchen levothyroxine (SYNTHROID) 137 MCG tablet Take 137 mcg by mouth daily.    Marland Kitchen lisinopril (ZESTRIL) 20 MG tablet Take 1 tablet by mouth daily.    Marland Kitchen losartan (COZAAR) 100 MG tablet Take 100 mg by mouth daily.    . medroxyPROGESTERone (PROVERA) 2.5 MG tablet Take 1 tablet by mouth daily.    . Melatonin 10 MG TABS Take 10 mg by mouth at bedtime.     . Multiple Vitamins-Minerals (MULTIVITAMIN ADULT PO) Take 1 tablet by mouth daily.    . potassium chloride SA (KLOR-CON) 20 MEQ tablet Take by mouth.    . primidone (MYSOLINE) 50 MG tablet Take by mouth. Take 200 mg every am and pm    . propranolol (INDERAL) 60 MG tablet Take 60 mg by mouth in the morning.    . sertraline (ZOLOFT) 50 MG tablet Take 50 mg by mouth daily.   0  . triamcinolone (KENALOG) 0.025 % cream SMARTSIG:Sparingly Topical Twice Daily    . vitamin B-12 (CYANOCOBALAMIN) 1000 MCG tablet Take 1,000 mcg by mouth daily.     No current facility-administered medications on file prior to visit.    Allergies  Allergen Reactions  . Hydroquinone     Pinkness and edema of face and  eyelids, severe    Objective: Physical Exam  General: Well developed, nourished, no acute distress, awake, alert and oriented x 3  Vascular: Dorsalis pedis artery 1/4 bilateral, Posterior tibial artery 1/4 bilateral, skin temperature warm to warm proximal to distal bilateral lower extremities, no varicosities, pedal hair present bilateral.  Neurological: Gross sensation present via light touch bilateral.   Dermatological: Skin is warm, dry, and supple bilateral, Bilateral hallux nails are short, thick, with moderate subungal debris, all other nails are mildly elongated and minimal debris, no webspace macerations present bilateral, no open lesions present bilateral, no  callus/corns/hyperkeratotic tissue present bilateral. No signs of infection bilateral.  Musculoskeletal: No symptomatic boney deformities noted bilateral. Muscular strength within normal limits without painon range of motion. No pain with calf compression bilateral.  Assessment and Plan:  Problem List Items Addressed This Visit      Nervous and Auditory   GBS (Guillain-Barre syndrome) (HCC)   Relevant Medications   FLUoxetine (PROZAC) 20 MG tablet   gabapentin (NEURONTIN) 100 MG capsule    Other Visit Diagnoses    Pain due to onychomycosis of toenails of both feet    -  Primary   Relevant Medications   fluconazole (DIFLUCAN) 150 MG tablet   ketoconazole (NIZORAL) 2 % cream      -Examined patient.  -Previous ABN on file -Re-Discussed treatment options for painful mycotic nails and pain to toes. -Mechanically debrided and reduced mycotic nails with sterile nail nipper and dremel nail file without incident. -Recommend continue with vicks to nails -Patient to return in 6 months for follow up evaluation or sooner if symptoms worsen.  Asencion Islam, DPM

## 2020-04-03 ENCOUNTER — Ambulatory Visit: Payer: Self-pay | Admitting: Orthopedic Surgery

## 2020-04-05 ENCOUNTER — Telehealth: Payer: Self-pay | Admitting: Orthopedic Surgery

## 2020-04-05 NOTE — Telephone Encounter (Signed)
Good Afternoon, [name] daughter called today(04/05/20) upset bc pt understood that she would be seen by Dr Mariea Clonts on Tuesday 04/10/20.  So I'll back up & help update from begininng: Ms Elenor Quinones was scheduled Oct 2021 bc she was running out of meds & couldn't get an appt soon enough with Dr Mariea Clonts as new pt.  She agreed to see Amy to get the meds refill, at checkout she was scheduled for physical with Amy (daughter says in error) for 04/10/20. I apologized for this error & offered the next available appt for physical with Dr Mariea Clonts on 05/14/20(giving Dr Mariea Clonts a full 45 min) being Dr Mariea Clonts hasn't met or consulted with Ms Knoth.  Pts daughter, replied that appt was unexceptable & hung up. I'm not sure if she will show up on Tues 04/10/20 or what to offer from here. Please advise Thanks, Kathyrn Lass

## 2020-04-06 NOTE — Telephone Encounter (Signed)
Sorry to hear about the scheduling error.i would recommend to see Brandy Fargo NP on 04/10/2020 for acute issues ( I see on schedule that she wants to address skin issues) then follow up with Dr.Reed as scheduled 05/14/2020 for a Annual Physical Exam if Dr.Reed does not have any sooner appointment openings.

## 2020-04-10 ENCOUNTER — Ambulatory Visit: Payer: Medicare Other | Admitting: Orthopedic Surgery

## 2020-04-19 ENCOUNTER — Ambulatory Visit: Payer: Medicare Other | Admitting: Internal Medicine

## 2020-05-10 ENCOUNTER — Ambulatory Visit: Payer: Medicare Other | Admitting: Internal Medicine

## 2020-08-14 ENCOUNTER — Ambulatory Visit: Payer: Medicare Other | Admitting: Internal Medicine

## 2020-09-06 ENCOUNTER — Ambulatory Visit (INDEPENDENT_AMBULATORY_CARE_PROVIDER_SITE_OTHER): Payer: Medicare Other | Admitting: Sports Medicine

## 2020-09-06 ENCOUNTER — Encounter: Payer: Self-pay | Admitting: Sports Medicine

## 2020-09-06 ENCOUNTER — Other Ambulatory Visit: Payer: Self-pay

## 2020-09-06 DIAGNOSIS — M79675 Pain in left toe(s): Secondary | ICD-10-CM

## 2020-09-06 DIAGNOSIS — G610 Guillain-Barre syndrome: Secondary | ICD-10-CM

## 2020-09-06 DIAGNOSIS — M79674 Pain in right toe(s): Secondary | ICD-10-CM | POA: Diagnosis not present

## 2020-09-06 DIAGNOSIS — B351 Tinea unguium: Secondary | ICD-10-CM | POA: Diagnosis not present

## 2020-09-06 NOTE — Progress Notes (Signed)
Subjective: Brandy Hicks is a 87 y.o. female patient seen today in office with complaint of painful thickened toenails; unable to trim. Patient denies any changes since last encounter.  Patient has no other pedal complaints at this time.     Patient Active Problem List   Diagnosis Date Noted   Cholecystitis, acute 01/03/2020   Acute cholecystitis 01/02/2020   Hypomagnesemia 01/02/2020   Hyperkalemia 01/02/2020   Mixed hyperlipidemia 01/02/2020   Hypothyroidism 01/02/2020   Chest pain 01/02/2020   Lung infiltrate on CT 01/02/2020   Fecal impaction (HCC) 04/19/2019   Acute urinary retention 04/19/2019   Leukocytosis 04/19/2019   Urinary retention 04/19/2019   Hyponatremia 04/19/2019   Hypokalemia 04/19/2019   Voice tremor 10/13/2017   GBS (Guillain-Barre syndrome) (HCC) 07/31/2015   History of Guillain-Barre syndrome 05/02/2015   Anxiety state 05/02/2015   Panic attack 05/02/2015   Essential tremor 05/02/2015   Altered mental status    UTI (urinary tract infection) 04/22/2015   Acute kidney injury (HCC) 04/22/2015   Essential hypertension, benign 11/28/2013   Depression 04/09/2011   Insomnia 04/09/2011    Current Outpatient Medications on File Prior to Visit  Medication Sig Dispense Refill   amLODipine (NORVASC) 10 MG tablet Take 10 mg by mouth daily.     amoxicillin (AMOXIL) 500 MG tablet Take 500 mg by mouth 3 (three) times daily.     aspirin 81 MG tablet Take 81 mg by mouth daily.     atorvastatin (LIPITOR) 40 MG tablet Take 40 mg by mouth daily.     betamethasone dipropionate 0.05 % cream Apply 1 application topically 2 (two) times daily.     chlorthalidone (HYGROTON) 25 MG tablet Take 1 tablet (25 mg total) by mouth daily.  0   Cholecalciferol (VITAMIN D) 125 MCG (5000 UT) CAPS Take 1 capsule by mouth daily.     fluconazole (DIFLUCAN) 150 MG tablet Take 150 mg by mouth once.     FLUoxetine (PROZAC) 20 MG tablet Take 1 tablet by mouth daily.     fluticasone  (FLONASE) 50 MCG/ACT nasal spray Place 2 sprays into both nostrils daily.     fluticasone (FLONASE) 50 MCG/ACT nasal spray Place into the nose.     gabapentin (NEURONTIN) 100 MG capsule Take 100 mg by mouth at bedtime.     gabapentin (NEURONTIN) 300 MG capsule Take 2 capsules by mouth 2 (two) times daily.     hydrocortisone 2.5 % ointment Apply topically 2 (two) times daily.     ketoconazole (NIZORAL) 2 % cream Apply topically 2 (two) times daily.     levothyroxine (SYNTHROID) 137 MCG tablet Take 137 mcg by mouth daily.     lisinopril (ZESTRIL) 20 MG tablet Take 1 tablet by mouth daily.     losartan (COZAAR) 100 MG tablet Take 100 mg by mouth daily.     medroxyPROGESTERone (PROVERA) 2.5 MG tablet Take 1 tablet by mouth daily.     Melatonin 10 MG TABS Take 10 mg by mouth at bedtime.      Multiple Vitamins-Minerals (MULTIVITAMIN ADULT PO) Take 1 tablet by mouth daily.     potassium chloride SA (KLOR-CON) 20 MEQ tablet Take by mouth.     primidone (MYSOLINE) 50 MG tablet Take by mouth. Take 200 mg every am and pm     propranolol (INDERAL) 60 MG tablet Take 60 mg by mouth in the morning.     sertraline (ZOLOFT) 50 MG tablet Take 50 mg by mouth  daily.   0   triamcinolone (KENALOG) 0.025 % cream SMARTSIG:Sparingly Topical Twice Daily     triamcinolone cream (KENALOG) 0.1 % SMARTSIG:Topical Twice a Week PRN     vitamin B-12 (CYANOCOBALAMIN) 1000 MCG tablet Take 1,000 mcg by mouth daily.     No current facility-administered medications on file prior to visit.    Allergies  Allergen Reactions   Hydroquinone     Pinkness and edema of face and eyelids, severe    Objective: Physical Exam  General: Well developed, nourished, no acute distress, awake, alert and oriented x 3  Vascular: Dorsalis pedis artery 1/4 bilateral, Posterior tibial artery 1/4 bilateral, skin temperature warm to warm proximal to distal bilateral lower extremities, no varicosities, pedal hair present  bilateral.  Neurological: Gross sensation present via light touch bilateral.   Dermatological: Skin is warm, dry, and supple bilateral, Bilateral hallux nails are short, thick, with moderate subungal debris, all other nails are mildly elongated and minimal debris, no webspace macerations present bilateral, no open lesions present bilateral, no callus/corns/hyperkeratotic tissue present bilateral. No signs of infection bilateral.  Musculoskeletal: No symptomatic boney deformities noted bilateral. Muscular strength within normal limits without painon range of motion. No pain with calf compression bilateral.  Assessment and Plan:  Problem List Items Addressed This Visit       Nervous and Auditory   GBS (Guillain-Barre syndrome) (HCC)   Relevant Medications   gabapentin (NEURONTIN) 300 MG capsule   Other Visit Diagnoses     Pain due to onychomycosis of toenails of both feet    -  Primary       -Examined patient.  -Previous ABN on file -Re-Discussed treatment options for painful mycotic nails and pain to toes. -Mechanically debrided and reduced mycotic nails with sterile nail nipper and dremel nail file without incident. -Recommend continue with vicks to nails -Patient to return in 6 months for follow up evaluation or sooner if symptoms worsen.  Asencion Islam, DPM  Subjective: Brandy Hicks is a 87 y.o. female patient seen today in office with complaint of painful thickened toenails; unable to trim. Patient denies any changes since last encounter.  Patient has no other pedal complaints at this time.     Patient Active Problem List   Diagnosis Date Noted   Cholecystitis, acute 01/03/2020   Acute cholecystitis 01/02/2020   Hypomagnesemia 01/02/2020   Hyperkalemia 01/02/2020   Mixed hyperlipidemia 01/02/2020   Hypothyroidism 01/02/2020   Chest pain 01/02/2020   Lung infiltrate on CT 01/02/2020   Fecal impaction (HCC) 04/19/2019   Acute urinary retention 04/19/2019    Leukocytosis 04/19/2019   Urinary retention 04/19/2019   Hyponatremia 04/19/2019   Hypokalemia 04/19/2019   Voice tremor 10/13/2017   GBS (Guillain-Barre syndrome) (HCC) 07/31/2015   History of Guillain-Barre syndrome 05/02/2015   Anxiety state 05/02/2015   Panic attack 05/02/2015   Essential tremor 05/02/2015   Altered mental status    UTI (urinary tract infection) 04/22/2015   Acute kidney injury (HCC) 04/22/2015   Essential hypertension, benign 11/28/2013   Depression 04/09/2011   Insomnia 04/09/2011    Current Outpatient Medications on File Prior to Visit  Medication Sig Dispense Refill   amLODipine (NORVASC) 10 MG tablet Take 10 mg by mouth daily.     aspirin 81 MG tablet Take 81 mg by mouth daily.     atorvastatin (LIPITOR) 40 MG tablet Take 40 mg by mouth daily.     betamethasone dipropionate 0.05 % cream Apply 1  application topically 2 (two) times daily.     chlorthalidone (HYGROTON) 25 MG tablet Take 1 tablet (25 mg total) by mouth daily.  0   Cholecalciferol (VITAMIN D) 125 MCG (5000 UT) CAPS Take 1 capsule by mouth daily.     fluconazole (DIFLUCAN) 150 MG tablet Take 150 mg by mouth once.     FLUoxetine (PROZAC) 20 MG tablet Take 1 tablet by mouth daily.     fluticasone (FLONASE) 50 MCG/ACT nasal spray Place 2 sprays into both nostrils daily.     gabapentin (NEURONTIN) 100 MG capsule Take 100 mg by mouth at bedtime.     ketoconazole (NIZORAL) 2 % cream Apply topically 2 (two) times daily.     levothyroxine (SYNTHROID) 137 MCG tablet Take 137 mcg by mouth daily.     lisinopril (ZESTRIL) 20 MG tablet Take 1 tablet by mouth daily.     losartan (COZAAR) 100 MG tablet Take 100 mg by mouth daily.     medroxyPROGESTERone (PROVERA) 2.5 MG tablet Take 1 tablet by mouth daily.     Melatonin 10 MG TABS Take 10 mg by mouth at bedtime.      Multiple Vitamins-Minerals (MULTIVITAMIN ADULT PO) Take 1 tablet by mouth daily.     potassium chloride SA (KLOR-CON) 20 MEQ tablet Take by  mouth.     primidone (MYSOLINE) 50 MG tablet Take by mouth. Take 200 mg every am and pm     propranolol (INDERAL) 60 MG tablet Take 60 mg by mouth in the morning.     sertraline (ZOLOFT) 50 MG tablet Take 50 mg by mouth daily.   0   triamcinolone (KENALOG) 0.025 % cream SMARTSIG:Sparingly Topical Twice Daily     vitamin B-12 (CYANOCOBALAMIN) 1000 MCG tablet Take 1,000 mcg by mouth daily.     No current facility-administered medications on file prior to visit.    Allergies  Allergen Reactions   Hydroquinone     Pinkness and edema of face and eyelids, severe    Objective: Physical Exam  General: Well developed, nourished, no acute distress, awake, alert and oriented x 3  Vascular: Dorsalis pedis artery 1/4 bilateral, Posterior tibial artery 1/4 bilateral, skin temperature warm to warm proximal to distal bilateral lower extremities, no varicosities, pedal hair present bilateral.  Neurological: Gross sensation present via light touch bilateral.   Dermatological: Skin is warm, dry, and supple bilateral, Bilateral hallux nails are short, thick, with moderate subungal debris, all other nails are mildly elongated and minimal debris, no webspace macerations present bilateral, no open lesions present bilateral, no callus/corns/hyperkeratotic tissue present bilateral. No signs of infection bilateral.  Musculoskeletal: No symptomatic boney deformities noted bilateral. Muscular strength within normal limits without painon range of motion. No pain with calf compression bilateral.  Assessment and Plan:  Problem List Items Addressed This Visit       Nervous and Auditory   GBS (Guillain-Barre syndrome) (HCC)   Other Visit Diagnoses     Pain due to onychomycosis of toenails of both feet    -  Primary       -Examined patient.  -Re-Discussed treatment options for painful mycotic nails and pain to toes. -Mechanically debrided and reduced mycotic nails with sterile nail nipper and dremel nail  file without incident. -Patient to return in 6 months for follow up evaluation or sooner if symptoms worsen.  Asencion Islam, DPM

## 2020-09-20 ENCOUNTER — Other Ambulatory Visit: Payer: Self-pay | Admitting: Family Medicine

## 2020-09-20 DIAGNOSIS — E2839 Other primary ovarian failure: Secondary | ICD-10-CM

## 2020-12-26 ENCOUNTER — Other Ambulatory Visit: Payer: Self-pay

## 2020-12-26 ENCOUNTER — Ambulatory Visit: Payer: Medicare Other | Attending: Orthopaedic Surgery | Admitting: Rehabilitative and Restorative Service Providers"

## 2020-12-26 ENCOUNTER — Encounter: Payer: Self-pay | Admitting: Rehabilitative and Restorative Service Providers"

## 2020-12-26 DIAGNOSIS — M546 Pain in thoracic spine: Secondary | ICD-10-CM | POA: Diagnosis present

## 2020-12-26 DIAGNOSIS — R262 Difficulty in walking, not elsewhere classified: Secondary | ICD-10-CM

## 2020-12-26 DIAGNOSIS — M545 Low back pain, unspecified: Secondary | ICD-10-CM | POA: Insufficient documentation

## 2020-12-26 DIAGNOSIS — M6281 Muscle weakness (generalized): Secondary | ICD-10-CM

## 2020-12-26 DIAGNOSIS — R252 Cramp and spasm: Secondary | ICD-10-CM

## 2020-12-26 NOTE — Therapy (Signed)
[facility] @ [facility] [address] [place], [place], [address] Phone: [phone]   Fax:  [phone]  Physical Therapy Evaluation  Patient Details  Name: Brandy Hicks MRN: [id number] Date of Birth: 10/13/1932 Referring Provider (PT): Dr Sharolyn Douglas   Encounter Date: 12/26/2020   PT End of Session - 12/26/20 1308     Visit Number 1    Date for PT Re-Evaluation 02/08/21    Authorization Type MCR    PT Start Time 1230    PT Stop Time 1310    PT Time Calculation (min) 40 min    Activity Tolerance Patient tolerated treatment well    Behavior During Therapy WFL for tasks assessed/performed             Past Medical History:  Diagnosis Date   Chronic kidney disease, stage 3 unspecified (HCC)    Chronic kidney disease, unspecified    Constipation, unspecified    Depression    Essential hypertension    Essential tremor    Guillain Barr syndrome (HCC)    Hormone replacement therapy    Hyperlipidemia    Hypertension    Hypothyroid    Hypothyroidism, unspecified    Impacted cerumen of both ears    Impacted cerumen of right ear    Osteoporosis    Ovarian cyst    Radiculopathy, thoracic region    Retention of urine, unspecified    Rosacea    Seborrheic dermatitis, unspecified    Somnolence    Thoracic spine pain    TIA (transient ischemic attack)    Tremor    Unspecified mood (affective) disorder (HCC)     Past Surgical History:  Procedure Laterality Date   CHOLECYSTECTOMY N/A 01/03/2020   Procedure: LAPAROSCOPIC CHOLECYSTECTOMY;  Surgeon: Harriette Bouillon, MD;  Location: [facility] OR;  Service: General;  Laterality: N/A;   FACIAL COSMETIC SURGERY     GALLBLADDER SURGERY     Post Gangerene Gallbladder Removal    HEEL SPUR SURGERY     HEEL SPUR SURGERY     TONSILLECTOMY      There were no vitals filed for this visit.    Subjective Assessment - 12/26/20 1234     Subjective Pt reports that since the  beginning of the year she has been having increased back pain, primarily of her upper back. She denies pain today or yesterday, but on Monday she was still having pain. She states that when she is having pain, she can hardly walk.    Pertinent History OA, essential tremors    Limitations Walking    How long can you stand comfortably? it depends on the day    How long can you walk comfortably? on painful days, pt can only tolerate approx a minute of walking    Patient Stated Goals Pt would like to have no pain.    Currently in Pain? No/denies                [facility] PT Assessment - 12/26/20 0001       Assessment   Medical Diagnosis M54.6 (ICD-10-CM) - Pain in thoracic spine  M54.50 (ICD-10-CM) - Low back pain, unspecified    Referring Provider (PT) Dr Sharolyn Douglas    Hand Dominance Right    Next MD Visit as needed    Prior Therapy yes, at a Senior Living      Precautions   Precautions None      Balance Screen  Has the patient fallen in the past 6 months No    Has the patient had a decrease in activity level because of a fear of falling?  No    Is the patient reluctant to leave their home because of a fear of falling?  No      Home Environment   Living Environment Other (Comment)   Independent Living   Living Arrangements Alone    Type of Home Apartment    Home Access Level entry    Home Layout One level   has elevator access to various points of building   Home Equipment Grab bars - toilet;Grab bars - tub/shower      Prior Function   Level of Independence Independent    Vocation Retired      IT consultant   Overall Cognitive Status Within Functional Limits for tasks assessed      Observation/Other Assessments   Focus on Therapeutic Outcomes (FOTO)  42%   Projected 49%.     ROM / Strength   AROM / PROM / Strength AROM;Strength      AROM   Overall AROM  Within functional limits for tasks performed      Strength   Overall Strength Comments B hip strength of 4/5 grossly  throughout      Transfers   Five time sit to stand comments  9 sec with pushing up from thighs      Standardized Balance Assessment   Standardized Balance Assessment Timed Up and Go Test      Timed Up and Go Test   TUG Normal TUG    Normal TUG (seconds) 8.9                        Objective measurements completed on examination: See above findings.       [facility] Adult PT Treatment/Exercise - 12/26/20 0001       Exercises   Exercises Shoulder      Shoulder Exercises: Seated   Retraction Strengthening;Both;10 reps    Other Seated Exercises Chin Tucks x10      Shoulder Exercises: ROM/Strengthening   UBE (Upper Arm Bike) L1.0 x2 min each way                       PT Short Term Goals - 12/26/20 1321       PT SHORT TERM GOAL #1   Title Pt will be independent with initial HEP.    Time 2    Period Weeks    Status New               PT Long Term Goals - 12/26/20 1321       PT LONG TERM GOAL #1   Title Pt will be independent with advanced HEP.    Time 6    Period Weeks    Status New      PT LONG TERM GOAL #2   Title Pt will increase FOTO to at least 50% to allow her to complete functional activities in her home.    Time 6    Period Weeks    Status New      PT LONG TERM GOAL #3   Title Pt will increase B hip strength to at least 4+/5 to allow her to perform functional activities.    Time 6    Period Weeks    Status New      PT LONG TERM GOAL #4  Title Pt will be able to ambulate around her community for at least 20 minutes without increased pain.    Time 6    Period Weeks                    Plan - 12/26/20 1313     Clinical Impression Statement Pt is an 87 y.o. female referred to outpatient PT by Dr Sharolyn Douglas for thoracic and low back pain. Pt states that she has been having the pain since earlier this year and has received in home PT at her Independent Living Facility and was ready to progress to outpatient PT. Pt  presents with recent back pain, muscle weakness, and imbalance of posture. Pt states at times she does have dizziness secondary to her medication. Pt reports when she is having her severe pain that she is unable to ambulate more than a couple of minutes and unable to complete tasks in her home such as changing her sheets. Pts PLOF is able to perform all cleaning and house tasks in her Independent Living apartment without assistance. Pt would benefit from skilled PT to address her functional impairments to allow her to return to her painfree lifestyle of being able to complete functional activites.    Personal Factors and Comorbidities Age;Fitness    Examination-Activity Limitations Locomotion Level    Examination-Participation Restrictions Cleaning    Stability/Clinical Decision Making Stable/Uncomplicated    Clinical Decision Making Low    Rehab Potential Good    PT Frequency 2x / week    PT Duration 6 weeks    PT Treatment/Interventions ADLs/Self Care Home Management;Cryotherapy;Electrical Stimulation;Iontophoresis 4mg /ml Dexamethasone;Moist Heat;Traction;Ultrasound;Gait training;Stair training;Functional mobility training;Therapeutic activities;Therapeutic exercise;Balance training;Neuromuscular re-education;Patient/family education;Manual techniques;Passive range of motion;Dry needling;Taping;Vestibular;Spinal Manipulations;Joint Manipulations    PT Next Visit Plan assess and progress HEP.  Core stability, strengthening, balance    Consulted and Agree with Plan of Care Patient             Patient will benefit from skilled therapeutic intervention in order to improve the following deficits and impairments:  Difficulty walking, Dizziness, Increased muscle spasms, Pain, Postural dysfunction, Decreased strength  Visit Diagnosis: Pain in thoracic spine - Plan: PT plan of care cert/re-cert  Chronic low back pain, unspecified back pain laterality, unspecified whether sciatica present - Plan: PT  plan of care cert/re-cert  Difficulty in walking, not elsewhere classified - Plan: PT plan of care cert/re-cert  Cramp and spasm - Plan: PT plan of care cert/re-cert  Muscle weakness (generalized) - Plan: PT plan of care cert/re-cert     Problem List Patient Active Problem List   Diagnosis Date Noted   Cholecystitis, acute 01/03/2020   Acute cholecystitis 01/02/2020   Hypomagnesemia 01/02/2020   Hyperkalemia 01/02/2020   Mixed hyperlipidemia 01/02/2020   Hypothyroidism 01/02/2020   Chest pain 01/02/2020   Lung infiltrate on CT 01/02/2020   Fecal impaction (HCC) 04/19/2019   Acute urinary retention 04/19/2019   Leukocytosis 04/19/2019   Urinary retention 04/19/2019   Hyponatremia 04/19/2019   Hypokalemia 04/19/2019   Voice tremor 10/13/2017   GBS (Guillain-Barre syndrome) (HCC) 07/31/2015   History of Guillain-Barre syndrome 05/02/2015   Anxiety state 05/02/2015   Panic attack 05/02/2015   Essential tremor 05/02/2015   Altered mental status    UTI (urinary tract infection) 04/22/2015   Acute kidney injury (HCC) 04/22/2015   Essential hypertension, benign 11/28/2013   Depression 04/09/2011   Insomnia 04/09/2011    04/11/2011, PT, DPT 12/26/2020, 1:25 PM  [facility] @ [facility] [address] [place], [place], [address] Phone: [phone]   Fax:  [phone]  Name: Brandy Hicks MRN: [id number] Date of Birth: 08/24/1932

## 2021-01-02 ENCOUNTER — Encounter: Payer: Self-pay | Admitting: Rehabilitative and Restorative Service Providers"

## 2021-01-02 ENCOUNTER — Other Ambulatory Visit: Payer: Self-pay

## 2021-01-02 ENCOUNTER — Ambulatory Visit: Payer: Medicare Other | Admitting: Rehabilitative and Restorative Service Providers"

## 2021-01-02 DIAGNOSIS — R262 Difficulty in walking, not elsewhere classified: Secondary | ICD-10-CM

## 2021-01-02 DIAGNOSIS — R252 Cramp and spasm: Secondary | ICD-10-CM

## 2021-01-02 DIAGNOSIS — M6281 Muscle weakness (generalized): Secondary | ICD-10-CM

## 2021-01-02 DIAGNOSIS — M546 Pain in thoracic spine: Secondary | ICD-10-CM | POA: Diagnosis not present

## 2021-01-02 DIAGNOSIS — G8929 Other chronic pain: Secondary | ICD-10-CM

## 2021-01-02 NOTE — Therapy (Signed)
[facility] @ [facility] [address] [place], [place], [address] Phone: [phone]   Fax:  [phone]  Physical Therapy Treatment  Patient Details  Name: Brandy Hicks MRN: [id number] Date of Birth: 02-10-1933 Referring Provider (PT): Dr Sharolyn Douglas   Encounter Date: 01/02/2021   PT End of Session - 01/02/21 1240     Visit Number 2    Date for PT Re-Evaluation 02/08/21    Authorization Type MCR    PT Start Time 1230    PT Stop Time 1310    PT Time Calculation (min) 40 min    Activity Tolerance Patient tolerated treatment well    Behavior During Therapy WFL for tasks assessed/performed             Past Medical History:  Diagnosis Date   Chronic kidney disease, stage 3 unspecified (HCC)    Chronic kidney disease, unspecified    Constipation, unspecified    Depression    Essential hypertension    Essential tremor    Guillain Barr syndrome (HCC)    Hormone replacement therapy    Hyperlipidemia    Hypertension    Hypothyroid    Hypothyroidism, unspecified    Impacted cerumen of both ears    Impacted cerumen of right ear    Osteoporosis    Ovarian cyst    Radiculopathy, thoracic region    Retention of urine, unspecified    Rosacea    Seborrheic dermatitis, unspecified    Somnolence    Thoracic spine pain    TIA (transient ischemic attack)    Tremor    Unspecified mood (affective) disorder (HCC)     Past Surgical History:  Procedure Laterality Date   CHOLECYSTECTOMY N/A 01/03/2020   Procedure: LAPAROSCOPIC CHOLECYSTECTOMY;  Surgeon: Harriette Bouillon, MD;  Location: [facility] OR;  Service: General;  Laterality: N/A;   FACIAL COSMETIC SURGERY     GALLBLADDER SURGERY     Post [place]rene Gallbladder Removal    HEEL SPUR SURGERY     HEEL SPUR SURGERY     TONSILLECTOMY      There were no vitals filed for this visit.   Subjective Assessment - 01/02/21 1240     Subjective Pt reports that pain was 7/10  yesterday, but denies any pain curently.    Patient Stated Goals Pt would like to have no pain.    Currently in Pain? No/denies                               [facility] Adult PT Treatment/Exercise - 01/02/21 0001       Shoulder Exercises: Standing   Horizontal ABduction Strengthening;Both;20 reps    Theraband Level (Shoulder Horizontal ABduction) Level 2 (Red)    External Rotation Strengthening;Both;20 reps    Theraband Level (Shoulder External Rotation) Level 2 (Red)      Shoulder Exercises: Therapy Ball   Other Therapy Ball Exercises 3 way pball rollout with 10 sec hold x5 each      Shoulder Exercises: ROM/Strengthening   Nustep Recumbent Bike L1 x5 min    Lat Pull 20 reps    Lat Pull Limitations 15# 2x10    Cybex Press Limitations Leg Press 30# 2x10    Cybex Row 20 reps    Cybex Row Limitations 15#    Wall Pushups 20 reps    Other ROM/Strengthening Exercises theraball wall slides 2x10  Manual Therapy   Manual Therapy Soft tissue mobilization;Myofascial release    Manual therapy comments in prone to thoracic and lumbar    Soft tissue mobilization STM to lumbar and throacic region paraspinals and scapular region.    Myofascial Release manual trigger point release to rhomboids                       PT Short Term Goals - 01/02/21 1317       PT SHORT TERM GOAL #1   Title Pt will be independent with initial HEP.    Status On-going               PT Long Term Goals - 12/26/20 1321       PT LONG TERM GOAL #1   Title Pt will be independent with advanced HEP.    Time 6    Period Weeks    Status New      PT LONG TERM GOAL #2   Title Pt will increase FOTO to at least 50% to allow her to complete functional activities in her home.    Time 6    Period Weeks    Status New      PT LONG TERM GOAL #3   Title Pt will increase B hip strength to at least 4+/5 to allow her to perform functional activities.    Time 6    Period Weeks     Status New      PT LONG TERM GOAL #4   Title Pt will be able to ambulate around her community for at least 20 minutes without increased pain.    Time 6    Period Weeks                   Plan - 01/02/21 1314     Clinical Impression Statement [name] tolerated session well.  She reported that the UBE caused some arm pain after last session, so utilized recumbent bike during todays session with cuing about progression of pt during bike. Pt required cuing for pacing and technique during strengthening on machines. Pt required cuing to maintain core stability with wall slides against physioball.  Ended session with manual therapy and pt reported that she felt less tight folowing.    PT Treatment/Interventions ADLs/Self Care Home Management;Cryotherapy;Electrical Stimulation;Iontophoresis 4mg /ml Dexamethasone;Moist Heat;Traction;Ultrasound;Gait training;Stair training;Functional mobility training;Therapeutic activities;Therapeutic exercise;Balance training;Neuromuscular re-education;Patient/family education;Manual techniques;Passive range of motion;Dry needling;Taping;Vestibular;Spinal Manipulations;Joint Manipulations    PT Next Visit Plan assess and progress HEP.  Core stability, strengthening, balance    Consulted and Agree with Plan of Care Patient             Patient will benefit from skilled therapeutic intervention in order to improve the following deficits and impairments:  Difficulty walking, Dizziness, Increased muscle spasms, Pain, Postural dysfunction, Decreased strength  Visit Diagnosis: Pain in thoracic spine  Chronic low back pain, unspecified back pain laterality, unspecified whether sciatica present  Difficulty in walking, not elsewhere classified  Cramp and spasm  Muscle weakness (generalized)     Problem List Patient Active Problem List   Diagnosis Date Noted   Cholecystitis, acute 01/03/2020   Acute cholecystitis 01/02/2020   Hypomagnesemia  01/02/2020   Hyperkalemia 01/02/2020   Mixed hyperlipidemia 01/02/2020   Hypothyroidism 01/02/2020   Chest pain 01/02/2020   Lung infiltrate on CT 01/02/2020   Fecal impaction (HCC) 04/19/2019   Acute urinary retention 04/19/2019   Leukocytosis 04/19/2019   Urinary  retention 04/19/2019   Hyponatremia 04/19/2019   Hypokalemia 04/19/2019   Voice tremor 10/13/2017   GBS (Guillain-Barre syndrome) (HCC) 07/31/2015   History of Guillain-Barre syndrome 05/02/2015   Anxiety state 05/02/2015   Panic attack 05/02/2015   Essential tremor 05/02/2015   Altered mental status    UTI (urinary tract infection) 04/22/2015   Acute kidney injury (HCC) 04/22/2015   Essential hypertension, benign 11/28/2013   Depression 04/09/2011   Insomnia 04/09/2011    Reather Laurence, PT, DPT 01/02/2021, 1:18 PM  [facility] @ [facility] [address] [place], [place], [address] Phone: [phone]   Fax:  [phone]  Name: Brandy Hicks MRN: [id number] Date of Birth: [birth year]

## 2021-01-03 ENCOUNTER — Ambulatory Visit: Payer: Medicare Other | Admitting: Rehabilitative and Restorative Service Providers"

## 2021-01-03 ENCOUNTER — Encounter: Payer: Self-pay | Admitting: Rehabilitative and Restorative Service Providers"

## 2021-01-03 DIAGNOSIS — M6281 Muscle weakness (generalized): Secondary | ICD-10-CM

## 2021-01-03 DIAGNOSIS — M546 Pain in thoracic spine: Secondary | ICD-10-CM

## 2021-01-03 DIAGNOSIS — R262 Difficulty in walking, not elsewhere classified: Secondary | ICD-10-CM

## 2021-01-03 DIAGNOSIS — M545 Low back pain, unspecified: Secondary | ICD-10-CM

## 2021-01-03 NOTE — Therapy (Signed)
[facility] @ [place], [place], [address] Phone: [phone]   Fax:  [phone]  Physical Therapy Treatment  Patient Details  Name: Brandy Hicks MRN: [id number] Date of Birth: Aug 26, 1932 Referring Provider (PT): Dr Rennis Harding   Encounter Date: 01/03/2021   PT End of Session - 01/03/21 1239     Visit Number 3    Date for PT Re-Evaluation 02/08/21    Authorization Type MCR    PT Start Time 1230    PT Stop Time 1310    PT Time Calculation (min) 40 min    Activity Tolerance Patient tolerated treatment well    Behavior During Therapy WFL for tasks assessed/performed             Past Medical History:  Diagnosis Date   Chronic kidney disease, stage 3 unspecified ([place])    Chronic kidney disease, unspecified    Constipation, unspecified    Depression    Essential hypertension    Essential tremor    Guillain Barr syndrome ([place])    Hormone replacement therapy    Hyperlipidemia    Hypertension    Hypothyroid    Hypothyroidism, unspecified    Impacted cerumen of both ears    Impacted cerumen of right ear    Osteoporosis    Ovarian cyst    Radiculopathy, thoracic region    Retention of urine, unspecified    Rosacea    Seborrheic dermatitis, unspecified    Somnolence    Thoracic spine pain    TIA (transient ischemic attack)    Tremor    Unspecified mood (affective) disorder ([place])     Past Surgical History:  Procedure Laterality Date   CHOLECYSTECTOMY N/A 01/03/2020   Procedure: LAPAROSCOPIC CHOLECYSTECTOMY;  Surgeon: Erroll Luna, MD;  Location: [facility];  Service: General;  Laterality: N/A;   FACIAL COSMETIC SURGERY     GALLBLADDER SURGERY     Post [place]rene Gallbladder Removal    HEEL SPUR SURGERY     HEEL SPUR SURGERY     TONSILLECTOMY      There were no vitals filed for this visit.   Subjective Assessment - 01/03/21 1238     Subjective I was a little sore after  yesterday.    Currently in Pain? Yes    Pain Location Back    Pain Orientation Mid    Pain Descriptors / Indicators Throbbing                               [facility] Adult PT Treatment/Exercise - 01/03/21 0001       Shoulder Exercises: Seated   Other Seated Exercises Chin Tucks x10    Other Seated Exercises Sit to/from stand x10 with red wt ball chest press      Shoulder Exercises: Standing   Horizontal ABduction Strengthening;Both;20 reps    Theraband Level (Shoulder Horizontal ABduction) Level 2 (Red)    External Rotation Strengthening;Both;20 reps    Theraband Level (Shoulder External Rotation) Level 2 (Red)    Row Strengthening;Both;20 reps;Theraband    Theraband Level (Shoulder Row) Level 2 (Red)    Other Standing Exercises shoulder overhead extension with red wt ball to wall x10.  Trunk rotation with arms outstretched holding red wt ball x10B      Shoulder Exercises: Therapy Ball   Other Therapy Ball Exercises 3 way pball rollout x10 each way  Shoulder Exercises: ROM/Strengthening   Nustep Recumbent Bike L1 x5 min      Manual Therapy   Manual Therapy Soft tissue mobilization;Myofascial release    Manual therapy comments in prone to thoracic and lumbar    Soft tissue mobilization STM to lumbar and throacic region paraspinals and scapular region.    Myofascial Release manual trigger point release to rhomboids                       PT Short Term Goals - 01/03/21 1320       PT SHORT TERM GOAL #1   Title Pt will be independent with initial HEP.    Status Partially Met               PT Long Term Goals - 12/26/20 1321       PT LONG TERM GOAL #1   Title Pt will be independent with advanced HEP.    Time 6    Period Weeks    Status New      PT LONG TERM GOAL #2   Title Pt will increase FOTO to at least 50% to allow her to complete functional activities in her home.    Time 6    Period Weeks    Status New      PT LONG TERM  GOAL #3   Title Pt will increase B hip strength to at least 4+/5 to allow her to perform functional activities.    Time 6    Period Weeks    Status New      PT LONG TERM GOAL #4   Title Pt will be able to ambulate around her community for at least 20 minutes without increased pain.    Time 6    Period Weeks                   Plan - 01/03/21 1313     Clinical Impression Statement [name] tolerated session without complaints of increased pain. With manual therapy, less trigger points were palpated today and pt with improved myofascial flow. Pt requires some cuing to decrease speed with ther ex to improve her muscle contractions. Pt required less cuing for core stability during session today.    PT Treatment/Interventions ADLs/Self Care Home Management;Cryotherapy;Electrical Stimulation;Iontophoresis 68m/ml Dexamethasone;Moist Heat;Traction;Ultrasound;Gait training;Stair training;Functional mobility training;Therapeutic activities;Therapeutic exercise;Balance training;Neuromuscular re-education;Patient/family education;Manual techniques;Passive range of motion;Dry needling;Taping;Vestibular;Spinal Manipulations;Joint Manipulations    PT Next Visit Plan assess and progress HEP.  Core stability, strengthening, balance    Consulted and Agree with Plan of Care Patient             Patient will benefit from skilled therapeutic intervention in order to improve the following deficits and impairments:  Difficulty walking, Dizziness, Increased muscle spasms, Pain, Postural dysfunction, Decreased strength  Visit Diagnosis: Pain in thoracic spine  Chronic low back pain, unspecified back pain laterality, unspecified whether sciatica present  Difficulty in walking, not elsewhere classified  Muscle weakness (generalized)     Problem List Patient Active Problem List   Diagnosis Date Noted   Cholecystitis, acute 01/03/2020   Acute cholecystitis 01/02/2020   Hypomagnesemia  01/02/2020   Hyperkalemia 01/02/2020   Mixed hyperlipidemia 01/02/2020   Hypothyroidism 01/02/2020   Chest pain 01/02/2020   Lung infiltrate on CT 01/02/2020   Fecal impaction ([place] 04/19/2019   Acute urinary retention 04/19/2019   Leukocytosis 04/19/2019   Urinary retention 04/19/2019   Hyponatremia 04/19/2019   Hypokalemia  04/19/2019   Voice tremor 10/13/2017   GBS (Guillain-Barre syndrome) ([place]) 07/31/2015   History of Guillain-Barre syndrome 05/02/2015   Anxiety state 05/02/2015   Panic attack 05/02/2015   Essential tremor 05/02/2015   Altered mental status    UTI (urinary tract infection) 04/22/2015   Acute kidney injury ([place]) 04/22/2015   Essential hypertension, benign 11/28/2013   Depression 04/09/2011   Insomnia 04/09/2011    Juel Burrow, PT, DPT 01/03/2021, 1:21 PM  [facility] @ [place], [place], [address] Phone: [phone]   Fax:  [phone]  Name: Brandy Hicks MRN: [id number] Date of Birth: 01/23/1933

## 2021-01-09 ENCOUNTER — Ambulatory Visit: Payer: Medicare Other | Admitting: Rehabilitative and Restorative Service Providers"

## 2021-01-09 ENCOUNTER — Other Ambulatory Visit: Payer: Self-pay

## 2021-01-09 ENCOUNTER — Encounter: Payer: Self-pay | Admitting: Rehabilitative and Restorative Service Providers"

## 2021-01-09 DIAGNOSIS — M546 Pain in thoracic spine: Secondary | ICD-10-CM

## 2021-01-09 DIAGNOSIS — M6281 Muscle weakness (generalized): Secondary | ICD-10-CM

## 2021-01-09 DIAGNOSIS — R262 Difficulty in walking, not elsewhere classified: Secondary | ICD-10-CM

## 2021-01-09 DIAGNOSIS — G8929 Other chronic pain: Secondary | ICD-10-CM

## 2021-01-09 NOTE — Therapy (Addendum)
[facility] @ [address] [place], [place], [address] Phone: [phone]   Fax:  [phone]  Physical Therapy Treatment  Patient Details  Name: Brandy Hicks [name] MRN: [id number] Date of Birth: February 07, 1933 Referring Provider (PT): Dr Rennis Harding   Encounter Date: 01/09/2021   PT End of Session - 01/09/21 1239     Visit Number 4    Date for PT Re-Evaluation 02/08/21    Authorization Type MCR    PT Start Time 1230    PT Stop Time 1310    PT Time Calculation (min) 40 min    Activity Tolerance Patient tolerated treatment well    Behavior During Therapy WFL for tasks assessed/performed             Past Medical History:  Diagnosis Date   Chronic kidney disease, stage 3 unspecified ([place])    Chronic kidney disease, unspecified    Constipation, unspecified    Depression    Essential hypertension    Essential tremor    Guillain Barr syndrome ([place])    Hormone replacement therapy    Hyperlipidemia    Hypertension    Hypothyroid    Hypothyroidism, unspecified    Impacted cerumen of both ears    Impacted cerumen of right ear    Osteoporosis    Ovarian cyst    Radiculopathy, thoracic region    Retention of urine, unspecified    Rosacea    Seborrheic dermatitis, unspecified    Somnolence    Thoracic spine pain    TIA (transient ischemic attack)    Tremor    Unspecified mood (affective) disorder ([place])     Past Surgical History:  Procedure Laterality Date   CHOLECYSTECTOMY N/A 01/03/2020   Procedure: LAPAROSCOPIC CHOLECYSTECTOMY;  Surgeon: Erroll Luna, MD;  Location: [facility];  Service: General;  Laterality: N/A;   FACIAL COSMETIC SURGERY     GALLBLADDER SURGERY     Post [place] Gallbladder Removal    HEEL SPUR SURGERY     HEEL SPUR SURGERY     TONSILLECTOMY      There were no vitals filed for this visit.   Subjective Assessment - 01/09/21 1238     Subjective Today is the first day that I have not  had pain since last week.    Patient Stated Goals Pt would like to have no pain.    Currently in Pain? No/denies                [facility] PT Assessment - 01/09/21 0001       Observation/Other Assessments   Focus on Therapeutic Outcomes (FOTO)  94%                           [facility] Adult PT Treatment/Exercise - 01/09/21 0001       Shoulder Exercises: Standing   Horizontal ABduction Strengthening;Both;20 reps    Theraband Level (Shoulder Horizontal ABduction) Level 2 ([place])    External Rotation Strengthening;Both;20 reps    Theraband Level (Shoulder External Rotation) Level 2 (Red)    Diagonals Strengthening;Both;10 reps    Theraband Level (Shoulder Diagonals) Level 2 (Red)      Shoulder Exercises: Therapy Ball   Other Therapy Ball Exercises 3 way pball rollout x5 each way      Shoulder Exercises: ROM/Strengthening   Nustep Recumbent Bike L1 x5 min      Manual Therapy   Manual Therapy Soft  tissue mobilization;Myofascial release    Manual therapy comments in prone to thoracic and lumbar    Soft tissue mobilization STM to lumbar and throacic region paraspinals and scapular region.    Myofascial Release manual trigger point release to rhomboids                       PT Short Term Goals - 01/09/21 1323       PT SHORT TERM GOAL #1   Title Pt will be independent with initial HEP.    Status Achieved               PT Long Term Goals - 01/09/21 1323       PT LONG TERM GOAL #1   Title Pt will be independent with advanced HEP.    Status Partially Met      PT LONG TERM GOAL #2   Title Pt will increase FOTO to at least 50% to allow her to complete functional activities in her home.    Status Achieved      PT LONG TERM GOAL #3   Title Pt will increase B hip strength to at least 4+/5 to allow her to perform functional activities.    Status Partially Met      PT LONG TERM GOAL #4   Title Pt will be able to ambulate around her community for  at least 20 minutes without increased pain.    Status Partially Met                   Plan - 01/09/21 1317     Clinical Impression Statement Pt has made great progress towards goal related activities.  Pt states that over the past couple of days that she has not had any back pain.  Less trigger points continue to be noted during manual therapy. Pt is contemplating switching back to her therapist at her facility since she is feeling better.  Pt continues to perform her HEP and is ambulating increased distances.    PT Treatment/Interventions ADLs/Self Care Home Management;Cryotherapy;Electrical Stimulation;Iontophoresis 53m/ml Dexamethasone;Moist Heat;Traction;Ultrasound;Gait training;Stair training;Functional mobility training;Therapeutic activities;Therapeutic exercise;Balance training;Neuromuscular re-education;Patient/family education;Manual techniques;Passive range of motion;Dry needling;Taping;Vestibular;Spinal Manipulations;Joint Manipulations    PT Next Visit Plan assess and progress HEP.  Core stability, strengthening, balance    Consulted and Agree with Plan of Care Patient             Patient will benefit from skilled therapeutic intervention in order to improve the following deficits and impairments:  Difficulty walking, Dizziness, Increased muscle spasms, Pain, Postural dysfunction, Decreased strength  Visit Diagnosis: Chronic low back pain, unspecified back pain laterality, unspecified whether sciatica present  Pain in thoracic spine  Difficulty in walking, not elsewhere classified  Muscle weakness (generalized)     Problem List Patient Active Problem List   Diagnosis Date Noted   Cholecystitis, acute 01/03/2020   Acute cholecystitis 01/02/2020   Hypomagnesemia 01/02/2020   Hyperkalemia 01/02/2020   Mixed hyperlipidemia 01/02/2020   Hypothyroidism 01/02/2020   Chest pain 01/02/2020   Lung infiltrate on CT 01/02/2020   Fecal impaction ([facility] 04/19/2019    Acute urinary retention 04/19/2019   Leukocytosis 04/19/2019   Urinary retention 04/19/2019   Hyponatremia 04/19/2019   Hypokalemia 04/19/2019   Voice tremor 10/13/2017   GBS (Guillain-Barre syndrome) ([facility] 07/31/2015   History of Guillain-Barre syndrome 05/02/2015   Anxiety state 05/02/2015   Panic attack 05/02/2015   Essential tremor 05/02/2015   Altered mental status  UTI (urinary tract infection) 04/22/2015   Acute kidney injury ([place]) 04/22/2015   Essential hypertension, benign 11/28/2013   Depression 04/09/2011   Insomnia 04/09/2011    PHYSICAL THERAPY DISCHARGE SUMMARY  Pt called in on 01/15/2021 stating that she wants to receive PT through her [facility] and has spoken with the PT there to transition care. Pt discharged at this time secondary to her request.  Patient agrees to discharge. Patient goals were partially met. Patient is being discharged due to the patient's request.   Juel Burrow, PT, DPT 01/09/2021, 1:26 PM  [facility] @ [place], [place], [address] Phone: [phone]   Fax:  [phone]  Name: TAMASHA LAPLANTE [name] MRN: [id number] Date of Birth: Mar 10, 1933

## 2021-01-16 ENCOUNTER — Encounter: Payer: Medicare Other | Admitting: Rehabilitative and Restorative Service Providers"

## 2021-01-23 ENCOUNTER — Encounter: Payer: Medicare Other | Admitting: Rehabilitative and Restorative Service Providers"

## 2021-01-24 ENCOUNTER — Encounter: Payer: Medicare Other | Admitting: Rehabilitative and Restorative Service Providers"

## 2021-01-30 ENCOUNTER — Encounter: Payer: Medicare Other | Admitting: Rehabilitative and Restorative Service Providers"

## 2021-01-31 ENCOUNTER — Encounter: Payer: Medicare Other | Admitting: Rehabilitative and Restorative Service Providers"

## 2021-02-13 ENCOUNTER — Encounter: Payer: Medicare Other | Admitting: Rehabilitative and Restorative Service Providers"

## 2021-03-07 ENCOUNTER — Other Ambulatory Visit: Payer: Self-pay

## 2021-03-07 ENCOUNTER — Encounter: Payer: Self-pay | Admitting: Sports Medicine

## 2021-03-07 ENCOUNTER — Ambulatory Visit (INDEPENDENT_AMBULATORY_CARE_PROVIDER_SITE_OTHER): Payer: Medicare Other | Admitting: Sports Medicine

## 2021-03-07 DIAGNOSIS — B351 Tinea unguium: Secondary | ICD-10-CM

## 2021-03-07 DIAGNOSIS — M79675 Pain in left toe(s): Secondary | ICD-10-CM | POA: Diagnosis not present

## 2021-03-07 DIAGNOSIS — M79674 Pain in right toe(s): Secondary | ICD-10-CM

## 2021-03-07 DIAGNOSIS — G610 Guillain-Barre syndrome: Secondary | ICD-10-CM

## 2021-03-07 NOTE — Progress Notes (Signed)
Subjective: Brandy Hicks is a 87 y.o. female patient seen today in office with complaint of painful thickened toenails; unable to trim. Patient denies any changes since last encounter.  Patient has no other pedal complaints at this time.     Got her feet wet this afternoon while trying to get into the car.   Patient Active Problem List   Diagnosis Date Noted   Cholecystitis, acute 01/03/2020   Acute cholecystitis 01/02/2020   Hypomagnesemia 01/02/2020   Hyperkalemia 01/02/2020   Mixed hyperlipidemia 01/02/2020   Hypothyroidism 01/02/2020   Chest pain 01/02/2020   Lung infiltrate on CT 01/02/2020   Fecal impaction (H[facility]) 04/19/2019   Acute urinary retention 04/19/2019   Leukocytosis 04/19/2019   Urinary retention 04/19/2019   Hyponatremia 04/19/2019   Hypokalemia 04/19/2019   Voice tremor 10/13/2017   GBS (Guillain-Barre syndrome) (HCC) 07/31/2015   History of Guillain-Barre syndrome 05/02/2015   Anxiety state 05/02/2015   Panic attack 05/02/2015   Essential tremor 05/02/2015   Altered mental status    UTI (urinary tract infection) 04/22/2015   Acute kidney injury ([facility]) 04/22/2015   Essential hypertension, benign 11/28/2013   Depression 04/09/2011   Insomnia 04/09/2011    Current Outpatient Medications on File Prior to Visit  Medication Sig Dispense Refill   amLODipine (NORVASC) 10 MG tablet Take 10 mg by mouth daily.     amoxicillin (AMOXIL) 500 MG tablet Take 500 mg by mouth 3 (three) times daily.     aspirin 81 MG tablet Take 81 mg by mouth daily.     atorvastatin (LIPITOR) 40 MG tablet Take 40 mg by mouth daily.     betamethasone dipropionate 0.05 % cream Apply 1 application topically 2 (two) times daily.     chlorthalidone (HYGROTON) 25 MG tablet Take 1 tablet (25 mg total) by mouth daily.  0   Cholecalciferol (VITAMIN D) 125 MCG (5000 UT) CAPS Take 1 capsule by mouth daily.     fluconazole (DIFLUCAN) 150 MG tablet Take 150 mg by mouth once.     FLUoxetine  (PROZAC) 20 MG tablet Take 1 tablet by mouth daily.     fluticasone (FLONASE) 50 MCG/ACT nasal spray Place 2 sprays into both nostrils daily.     fluticasone (FLONASE) 50 MCG/ACT nasal spray Place into the nose.     gabapentin (NEURONTIN) 100 MG capsule Take 100 mg by mouth at bedtime.     gabapentin (NEURONTIN) 300 MG capsule Take 2 capsules by mouth 2 (two) times daily.     hydrocortisone 2.5 % ointment Apply topically 2 (two) times daily.     ketoconazole (NIZORAL) 2 % cream Apply topically 2 (two) times daily.     levothyroxine (SYNTHROID) 137 MCG tablet Take 137 mcg by mouth daily.     lisinopril (ZESTRIL) 20 MG tablet Take 1 tablet by mouth daily.     losartan (COZAAR) 100 MG tablet Take 100 mg by mouth daily.     medroxyPROGESTERone (PROVERA) 2.5 MG tablet Take 1 tablet by mouth daily.     Melatonin 10 MG TABS Take 10 mg by mouth at bedtime.      Multiple Vitamins-Minerals (MULTIVITAMIN ADULT PO) Take 1 tablet by mouth daily.     potassium chloride SA (KLOR-CON) 20 MEQ tablet Take by mouth.     primidone (MYSOLINE) 50 MG tablet Take by mouth. Take 200 mg every am and pm     propranolol (INDERAL) 60 MG tablet Take 60 mg by mouth in the  morning.     sertraline (ZOLOFT) 50 MG tablet Take 50 mg by mouth daily.   0   triamcinolone (KENALOG) 0.025 % cream SMARTSIG:Sparingly Topical Twice Daily     triamcinolone cream (KENALOG) 0.1 % SMARTSIG:Topical Twice a Week PRN     vitamin B-12 (CYANOCOBALAMIN) 1000 MCG tablet Take 1,000 mcg by mouth daily.     No current facility-administered medications on file prior to visit.    Allergies  Allergen Reactions   Hydroquinone     Pinkness and edema of face and eyelids, severe    Objective: Physical Exam  General: Well developed, nourished, no acute distress, awake, alert and oriented x 3  Vascular: Dorsalis pedis artery 1/4 bilateral, Posterior tibial artery 1/4 bilateral, skin temperature warm to warm proximal to distal bilateral lower  extremities, no varicosities, pedal hair present bilateral.  Neurological: Gross sensation present via light touch bilateral.   Dermatological: Skin is warm, dry, and supple bilateral, Bilateral hallux nails are short, thick, with moderate subungal debris, all other nails are mildly elongated and minimal debris, no webspace macerations present bilateral, no open lesions present bilateral, no callus/corns/hyperkeratotic tissue present bilateral. No signs of infection bilateral.  Musculoskeletal: No symptomatic boney deformities noted bilateral. Muscular strength within normal limits without painon range of motion. No pain with calf compression bilateral.  Assessment and Plan:  Problem List Items Addressed This Visit       Nervous and Auditory   GBS (Guillain-Barre syndrome) (HCC)   Other Visit Diagnoses     Pain due to onychomycosis of toenails of both feet    -  Primary       -Examined patient.  -Previous ABN on file -Re-Discussed treatment options for painful mycotic nails and pain to toes. -Mechanically debrided and reduced mycotic nails with sterile nail nipper and dremel nail file without incident. -Recommend continue with vicks to nails -Patient to return in 6 months for follow up evaluation or sooner if symptoms worsen.  Asencion Islam, DPM  Subjective: Brandy Hicks is a 87 y.o. female patient seen today in office with complaint of painful thickened toenails; unable to trim. Patient denies any changes since last encounter.  Patient has no other pedal complaints at this time.     Patient Active Problem List   Diagnosis Date Noted   Cholecystitis, acute 01/03/2020   Acute cholecystitis 01/02/2020   Hypomagnesemia 01/02/2020   Hyperkalemia 01/02/2020   Mixed hyperlipidemia 01/02/2020   Hypothyroidism 01/02/2020   Chest pain 01/02/2020   Lung infiltrate on CT 01/02/2020   Fecal impaction ([facility]) 04/19/2019   Acute urinary retention 04/19/2019   Leukocytosis  04/19/2019   Urinary retention 04/19/2019   Hyponatremia 04/19/2019   Hypokalemia 04/19/2019   Voice tremor 10/13/2017   GBS (Guillain-Barre syndrome) (H[facility]) 07/31/2015   History of Guillain-Barre syndrome 05/02/2015   Anxiety state 05/02/2015   Panic attack 05/02/2015   Essential tremor 05/02/2015   Altered mental status    UTI (urinary tract infection) 04/22/2015   Acute kidney injury (H[facility]) 04/22/2015   Essential hypertension, benign 11/28/2013   Depression 04/09/2011   Insomnia 04/09/2011    Current Outpatient Medications on File Prior to Visit  Medication Sig Dispense Refill   amLODipine (NORVASC) 10 MG tablet Take 10 mg by mouth daily.     amoxicillin (AMOXIL) 500 MG tablet Take 500 mg by mouth 3 (three) times daily.     aspirin 81 MG tablet Take 81 mg by mouth daily.  atorvastatin (LIPITOR) 40 MG tablet Take 40 mg by mouth daily.     betamethasone dipropionate 0.05 % cream Apply 1 application topically 2 (two) times daily.     chlorthalidone (HYGROTON) 25 MG tablet Take 1 tablet (25 mg total) by mouth daily.  0   Cholecalciferol (VITAMIN D) 125 MCG (5000 UT) CAPS Take 1 capsule by mouth daily.     fluconazole (DIFLUCAN) 150 MG tablet Take 150 mg by mouth once.     FLUoxetine (PROZAC) 20 MG tablet Take 1 tablet by mouth daily.     fluticasone (FLONASE) 50 MCG/ACT nasal spray Place 2 sprays into both nostrils daily.     fluticasone (FLONASE) 50 MCG/ACT nasal spray Place into the nose.     gabapentin (NEURONTIN) 100 MG capsule Take 100 mg by mouth at bedtime.     gabapentin (NEURONTIN) 300 MG capsule Take 2 capsules by mouth 2 (two) times daily.     hydrocortisone 2.5 % ointment Apply topically 2 (two) times daily.     ketoconazole (NIZORAL) 2 % cream Apply topically 2 (two) times daily.     levothyroxine (SYNTHROID) 137 MCG tablet Take 137 mcg by mouth daily.     lisinopril (ZESTRIL) 20 MG tablet Take 1 tablet by mouth daily.     losartan (COZAAR) 100 MG tablet Take 100 mg  by mouth daily.     medroxyPROGESTERone (PROVERA) 2.5 MG tablet Take 1 tablet by mouth daily.     Melatonin 10 MG TABS Take 10 mg by mouth at bedtime.      Multiple Vitamins-Minerals (MULTIVITAMIN ADULT PO) Take 1 tablet by mouth daily.     potassium chloride SA (KLOR-CON) 20 MEQ tablet Take by mouth.     primidone (MYSOLINE) 50 MG tablet Take by mouth. Take 200 mg every am and pm     propranolol (INDERAL) 60 MG tablet Take 60 mg by mouth in the morning.     sertraline (ZOLOFT) 50 MG tablet Take 50 mg by mouth daily.   0   triamcinolone (KENALOG) 0.025 % cream SMARTSIG:Sparingly Topical Twice Daily     triamcinolone cream (KENALOG) 0.1 % SMARTSIG:Topical Twice a Week PRN     vitamin B-12 (CYANOCOBALAMIN) 1000 MCG tablet Take 1,000 mcg by mouth daily.     No current facility-administered medications on file prior to visit.    Allergies  Allergen Reactions   Hydroquinone     Pinkness and edema of face and eyelids, severe    Objective: Physical Exam  General: Well developed, nourished, no acute distress, awake, alert and oriented x 3  Vascular: Dorsalis pedis artery 1/4 bilateral, Posterior tibial artery 1/4 bilateral, skin temperature warm to warm proximal to distal bilateral lower extremities, no varicosities, pedal hair present bilateral.  Neurological: Gross sensation present via light touch bilateral.   Dermatological: Skin is warm, dry, and supple bilateral, Bilateral hallux nails are short, thick, with moderate subungal debris, all other nails are mildly elongated and minimal debris, no webspace macerations present bilateral, no open lesions present bilateral, no callus/corns/hyperkeratotic tissue present bilateral. No signs of infection bilateral.  Musculoskeletal: No symptomatic boney deformities noted bilateral. Muscular strength within normal limits without painon range of motion. No pain with calf compression bilateral.  Assessment and Plan:  Problem List Items Addressed  This Visit       Nervous and Auditory   GBS (Guillain-Barre syndrome) (HCC)   Other Visit Diagnoses     Pain due to onychomycosis of toenails of both feet    -  Primary       -Examined patient.  -Re-Discussed treatment options for painful mycotic nails and pain to toes. -Mechanically debrided and reduced mycotic nails with sterile nail nipper and dremel nail file without incident. -Patient to return in 6 months for follow up evaluation or sooner if symptoms worsen.  Asencion Islam, DPM

## 2021-03-14 ENCOUNTER — Ambulatory Visit: Payer: Medicare Other | Admitting: Sports Medicine

## 2021-03-20 ENCOUNTER — Ambulatory Visit
Admission: RE | Admit: 2021-03-20 | Discharge: 2021-03-20 | Disposition: A | Discharge status: Home / Self Care | Payer: Medicare Other | Source: Ambulatory Visit | Attending: Family Medicine | Admitting: Family Medicine

## 2021-03-20 DIAGNOSIS — E2839 Other primary ovarian failure: Secondary | ICD-10-CM

## 2021-03-28 DIAGNOSIS — M6281 Muscle weakness (generalized): Secondary | ICD-10-CM | POA: Diagnosis not present

## 2021-04-04 DIAGNOSIS — M6281 Muscle weakness (generalized): Secondary | ICD-10-CM | POA: Diagnosis not present

## 2021-04-11 DIAGNOSIS — M6281 Muscle weakness (generalized): Secondary | ICD-10-CM | POA: Diagnosis not present

## 2021-07-11 DIAGNOSIS — I10 Essential (primary) hypertension: Secondary | ICD-10-CM | POA: Diagnosis not present

## 2021-07-11 DIAGNOSIS — E559 Vitamin D deficiency, unspecified: Secondary | ICD-10-CM | POA: Diagnosis not present

## 2021-07-11 DIAGNOSIS — E785 Hyperlipidemia, unspecified: Secondary | ICD-10-CM | POA: Diagnosis not present

## 2021-07-11 DIAGNOSIS — M5414 Radiculopathy, thoracic region: Secondary | ICD-10-CM | POA: Diagnosis not present

## 2021-07-11 DIAGNOSIS — Z0000 Encounter for general adult medical examination without abnormal findings: Secondary | ICD-10-CM | POA: Diagnosis not present

## 2021-07-11 DIAGNOSIS — R7302 Impaired glucose tolerance (oral): Secondary | ICD-10-CM | POA: Diagnosis not present

## 2021-07-11 DIAGNOSIS — F39 Unspecified mood [affective] disorder: Secondary | ICD-10-CM | POA: Diagnosis not present

## 2021-07-11 DIAGNOSIS — N183 Chronic kidney disease, stage 3 unspecified: Secondary | ICD-10-CM | POA: Diagnosis not present

## 2021-07-11 DIAGNOSIS — K5900 Constipation, unspecified: Secondary | ICD-10-CM | POA: Diagnosis not present

## 2021-07-11 DIAGNOSIS — G250 Essential tremor: Secondary | ICD-10-CM | POA: Diagnosis not present

## 2021-07-11 DIAGNOSIS — E039 Hypothyroidism, unspecified: Secondary | ICD-10-CM | POA: Diagnosis not present

## 2021-07-29 ENCOUNTER — Encounter: Payer: Self-pay | Admitting: Sports Medicine

## 2021-09-05 ENCOUNTER — Ambulatory Visit: Payer: Medicare Other | Admitting: Sports Medicine

## 2021-09-11 ENCOUNTER — Encounter: Payer: Self-pay | Admitting: Podiatry

## 2021-09-11 ENCOUNTER — Ambulatory Visit (INDEPENDENT_AMBULATORY_CARE_PROVIDER_SITE_OTHER): Payer: Medicare Other | Admitting: Podiatry

## 2021-09-11 DIAGNOSIS — G610 Guillain-Barre syndrome: Secondary | ICD-10-CM

## 2021-09-11 DIAGNOSIS — B351 Tinea unguium: Secondary | ICD-10-CM

## 2021-09-11 DIAGNOSIS — M79674 Pain in right toe(s): Secondary | ICD-10-CM

## 2021-09-11 DIAGNOSIS — M79675 Pain in left toe(s): Secondary | ICD-10-CM | POA: Diagnosis not present

## 2021-09-11 NOTE — Progress Notes (Signed)
Subjective: Brandy Hicks [name] is a 88 y.o. female patient seen today in office with complaint of painful thickened toenails; unable to trim. Patient denies any changes since last encounter.  Patient has no other pedal complaints at this time.       Patient Active Problem List   Diagnosis Date Noted   Cholecystitis, acute 01/03/2020   Acute cholecystitis 01/02/2020   Hypomagnesemia 01/02/2020   Hyperkalemia 01/02/2020   Mixed hyperlipidemia 01/02/2020   Hypothyroidism 01/02/2020   Chest pain 01/02/2020   Lung infiltrate on CT 01/02/2020   Fecal impaction (HCC) 04/19/2019   Acute urinary retention 04/19/2019   Leukocytosis 04/19/2019   Urinary retention 04/19/2019   Hyponatremia 04/19/2019   Hypokalemia 04/19/2019   Voice tremor 10/13/2017   GBS (Guillain-Barre syndrome) (HCC) 07/31/2015   History of Guillain-Barre syndrome 05/02/2015   Anxiety state 05/02/2015   Panic attack 05/02/2015   Essential tremor 05/02/2015   Altered mental status    UTI (urinary tract infection) 04/22/2015   Acute kidney injury (H[facility]) 04/22/2015   Essential hypertension, benign 11/28/2013   Depression 04/09/2011   Insomnia 04/09/2011    Current Outpatient Medications on File Prior to Visit  Medication Sig Dispense Refill   amLODipine (NORVASC) 10 MG tablet Take 10 mg by mouth daily.     amoxicillin (AMOXIL) 500 MG tablet Take 500 mg by mouth 3 (three) times daily.     aspirin 81 MG tablet Take 81 mg by mouth daily.     atorvastatin (LIPITOR) 40 MG tablet Take 40 mg by mouth daily.     betamethasone dipropionate 0.05 % cream Apply 1 application topically 2 (two) times daily.     chlorthalidone (HYGROTON) 25 MG tablet Take 1 tablet (25 mg total) by mouth daily.  0   Cholecalciferol (VITAMIN D) 125 MCG (5000 UT) CAPS Take 1 capsule by mouth daily.     fluconazole (DIFLUCAN) 150 MG tablet Take 150 mg by mouth once.     FLUoxetine (PROZAC) 20 MG tablet Take 1 tablet by mouth daily.      fluticasone (FLONASE) 50 MCG/ACT nasal spray Place 2 sprays into both nostrils daily.     fluticasone (FLONASE) 50 MCG/ACT nasal spray Place into the nose.     gabapentin (NEURONTIN) 100 MG capsule Take 100 mg by mouth at bedtime.     gabapentin (NEURONTIN) 300 MG capsule Take 2 capsules by mouth 2 (two) times daily.     hydrocortisone 2.5 % ointment Apply topically 2 (two) times daily.     ketoconazole (NIZORAL) 2 % cream Apply topically 2 (two) times daily.     levothyroxine (SYNTHROID) 137 MCG tablet Take 137 mcg by mouth daily.     lisinopril (ZESTRIL) 20 MG tablet Take 1 tablet by mouth daily.     losartan (COZAAR) 100 MG tablet Take 100 mg by mouth daily.     medroxyPROGESTERone (PROVERA) 2.5 MG tablet Take 1 tablet by mouth daily.     Melatonin 10 MG TABS Take 10 mg by mouth at bedtime.      Multiple Vitamins-Minerals (MULTIVITAMIN ADULT PO) Take 1 tablet by mouth daily.     potassium chloride SA (KLOR-CON) 20 MEQ tablet Take by mouth.     primidone (MYSOLINE) 50 MG tablet Take by mouth. Take 200 mg every am and pm     propranolol (INDERAL) 60 MG tablet Take 60 mg by mouth in the morning.     sertraline (ZOLOFT) 50 MG tablet Take 50 mg  by mouth daily.   0   triamcinolone (KENALOG) 0.025 % cream SMARTSIG:Sparingly Topical Twice Daily     triamcinolone cream (KENALOG) 0.1 % SMARTSIG:Topical Twice a Week PRN     vitamin B-12 (CYANOCOBALAMIN) 1000 MCG tablet Take 1,000 mcg by mouth daily.     No current facility-administered medications on file prior to visit.    Allergies  Allergen Reactions   Hydroquinone     Pinkness and edema of face and eyelids, severe    Objective: Physical Exam  General: Well developed, nourished, no acute distress, awake, alert and oriented x 3  Vascular: Dorsalis pedis artery 1/4 bilateral, Posterior tibial artery 1/4 bilateral, skin temperature warm to warm proximal to distal bilateral lower extremities, no varicosities, pedal hair present  bilateral.  Neurological: Gross sensation present via light touch bilateral.   Dermatological: Skin is warm, dry, and supple bilateral, Bilateral hallux nails are short, thick, with moderate subungal debris, all other nails are mildly elongated and minimal debris, no webspace macerations present bilateral, no open lesions present bilateral, no callus/corns/hyperkeratotic tissue present bilateral. No signs of infection bilateral.  Musculoskeletal: No symptomatic boney deformities noted bilateral. Muscular strength within normal limits without painon range of motion. No pain with calf compression bilateral.  Assessment and Plan:  Problem List Items Addressed This Visit       Nervous and Auditory   GBS (Guillain-Barre syndrome) (HCC)   Other Visit Diagnoses     Pain due to onychomycosis of toenails of both feet    -  Primary       -Examined patient.  -Previous ABN on file -Re-Discussed treatment options for painful mycotic nails and pain to toes. -Mechanically debrided and reduced mycotic nails with sterile nail nipper and dremel nail file without incident. -Recommend continue with vicks to nails -Patient to return in 6 months for follow up evaluation or sooner if symptoms worsen.  Louann Sjogren, DPM

## 2021-09-18 ENCOUNTER — Ambulatory Visit: Payer: Medicare Other | Admitting: Podiatrist

## 2021-10-02 DIAGNOSIS — I10 Essential (primary) hypertension: Secondary | ICD-10-CM | POA: Diagnosis not present

## 2021-10-02 DIAGNOSIS — E559 Vitamin D deficiency, unspecified: Secondary | ICD-10-CM | POA: Diagnosis not present

## 2021-10-02 DIAGNOSIS — E785 Hyperlipidemia, unspecified: Secondary | ICD-10-CM | POA: Diagnosis not present

## 2021-10-02 DIAGNOSIS — R7301 Impaired fasting glucose: Secondary | ICD-10-CM | POA: Diagnosis not present

## 2021-10-02 DIAGNOSIS — G250 Essential tremor: Secondary | ICD-10-CM | POA: Diagnosis not present

## 2021-10-02 DIAGNOSIS — N183 Chronic kidney disease, stage 3 unspecified: Secondary | ICD-10-CM | POA: Diagnosis not present

## 2021-10-02 DIAGNOSIS — K5900 Constipation, unspecified: Secondary | ICD-10-CM | POA: Diagnosis not present

## 2021-10-02 DIAGNOSIS — Z0001 Encounter for general adult medical examination with abnormal findings: Secondary | ICD-10-CM | POA: Diagnosis not present

## 2021-10-02 DIAGNOSIS — E039 Hypothyroidism, unspecified: Secondary | ICD-10-CM | POA: Diagnosis not present

## 2021-10-14 DIAGNOSIS — I10 Essential (primary) hypertension: Secondary | ICD-10-CM | POA: Diagnosis not present

## 2021-10-14 DIAGNOSIS — E1159 Type 2 diabetes mellitus with other circulatory complications: Secondary | ICD-10-CM | POA: Diagnosis not present

## 2021-10-14 DIAGNOSIS — E785 Hyperlipidemia, unspecified: Secondary | ICD-10-CM | POA: Diagnosis not present

## 2021-10-14 DIAGNOSIS — E039 Hypothyroidism, unspecified: Secondary | ICD-10-CM | POA: Diagnosis not present

## 2021-10-14 DIAGNOSIS — N1832 Chronic kidney disease, stage 3b: Secondary | ICD-10-CM | POA: Diagnosis not present

## 2021-10-17 DIAGNOSIS — R300 Dysuria: Secondary | ICD-10-CM | POA: Diagnosis not present

## 2021-10-17 DIAGNOSIS — E039 Hypothyroidism, unspecified: Secondary | ICD-10-CM | POA: Diagnosis not present

## 2021-10-17 DIAGNOSIS — I10 Essential (primary) hypertension: Secondary | ICD-10-CM | POA: Diagnosis not present

## 2021-10-17 DIAGNOSIS — N760 Acute vaginitis: Secondary | ICD-10-CM | POA: Diagnosis not present

## 2021-11-04 DIAGNOSIS — N189 Chronic kidney disease, unspecified: Secondary | ICD-10-CM | POA: Diagnosis not present

## 2021-11-04 DIAGNOSIS — I10 Essential (primary) hypertension: Secondary | ICD-10-CM | POA: Diagnosis not present

## 2021-11-04 DIAGNOSIS — E785 Hyperlipidemia, unspecified: Secondary | ICD-10-CM | POA: Diagnosis not present

## 2021-11-04 DIAGNOSIS — E039 Hypothyroidism, unspecified: Secondary | ICD-10-CM | POA: Diagnosis not present

## 2021-11-05 DIAGNOSIS — I10 Essential (primary) hypertension: Secondary | ICD-10-CM | POA: Diagnosis not present

## 2021-12-23 DIAGNOSIS — H31002 Unspecified chorioretinal scars, left eye: Secondary | ICD-10-CM | POA: Diagnosis not present

## 2021-12-23 DIAGNOSIS — H5203 Hypermetropia, bilateral: Secondary | ICD-10-CM | POA: Diagnosis not present

## 2021-12-23 DIAGNOSIS — H52203 Unspecified astigmatism, bilateral: Secondary | ICD-10-CM | POA: Diagnosis not present

## 2021-12-27 IMAGING — CT CT ANGIO CHEST-ABD-PELV FOR DISSECTION W/ AND WO/W CM
2 of 7 series · 13 of 46 positions shown, 15 images · non-contrast
Comparison: None.

CLINICAL DATA: Chest pain, abdominal pain, aortic dissection

EXAM:
CT ANGIOGRAPHY CHEST, ABDOMEN AND PELVIS
TECHNIQUE: Non-contrast CT of the chest was initially obtained.

[Series 7: dissection 2mm · axial · 0.68mm/px · z∈[+666,+1216]mm · 10 of 309 slices shown, 12 images]
[im 17/309  soft-tissue]
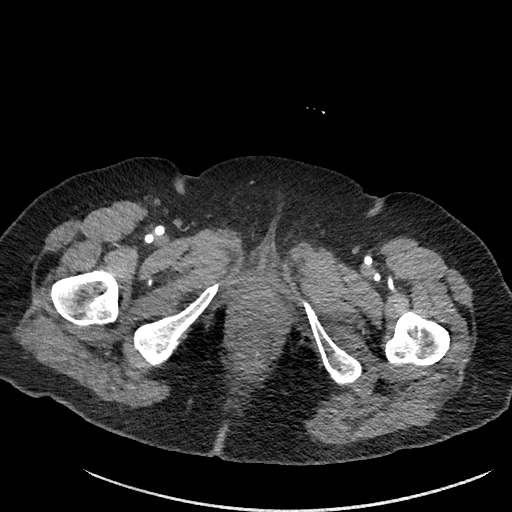
[im 17/309  bone]
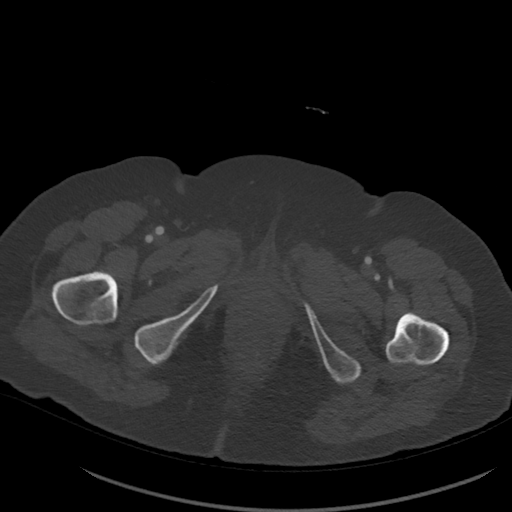
[im 49/309  soft-tissue]
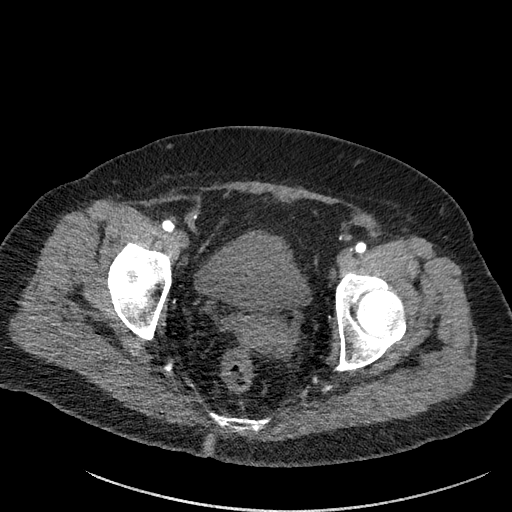
[im 82/309  soft-tissue]
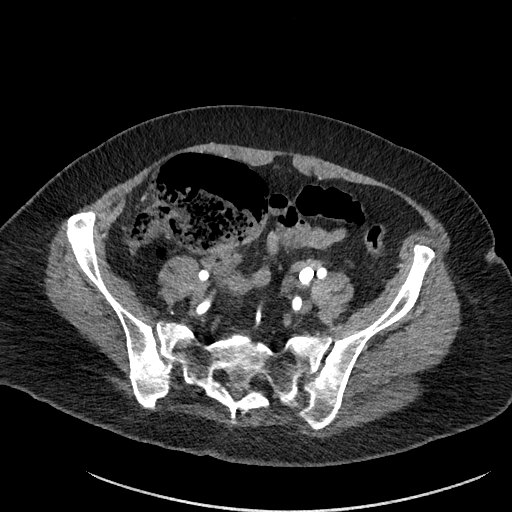
[im 114/309  soft-tissue]
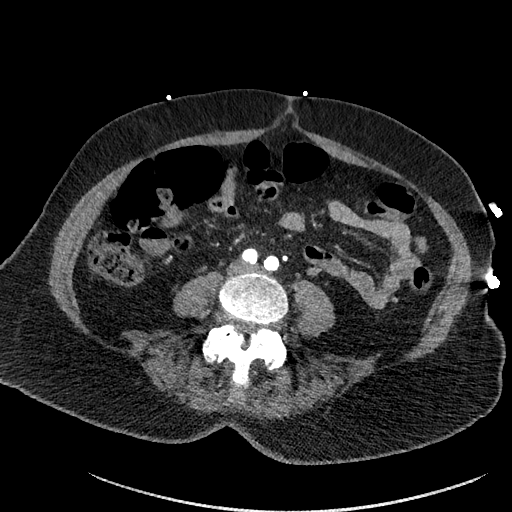
[im 146/309  soft-tissue]
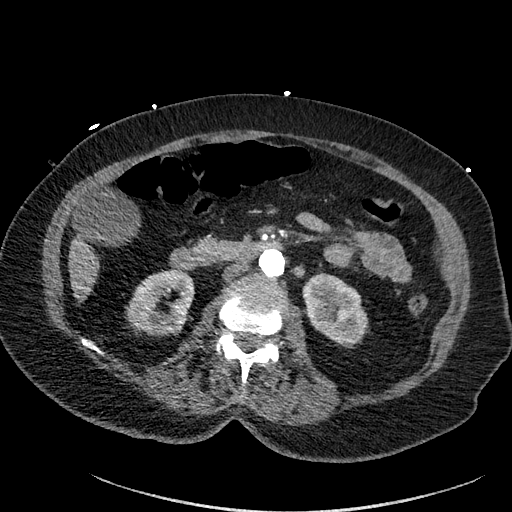
[im 163/309  soft-tissue]
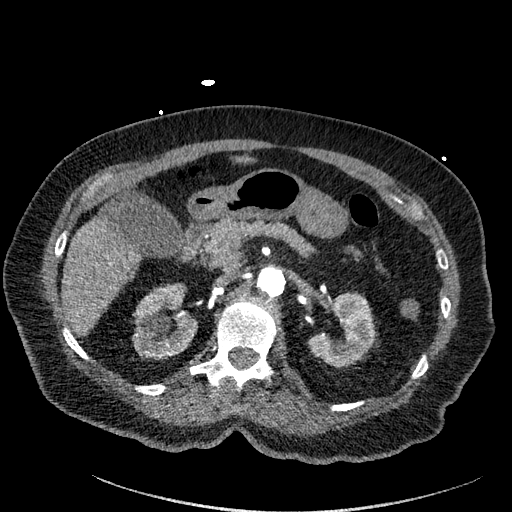
[im 195/309  soft-tissue]
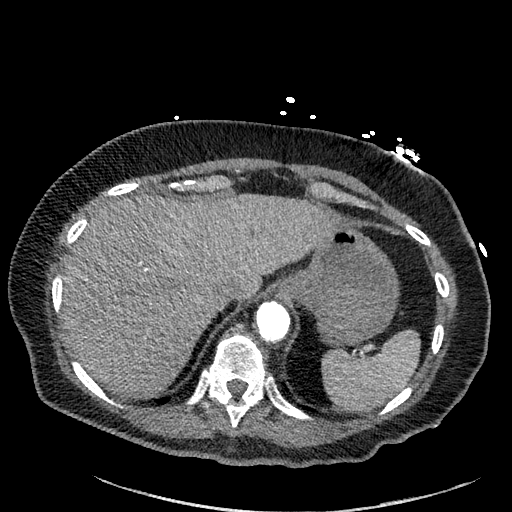
[im 227/309  soft-tissue]
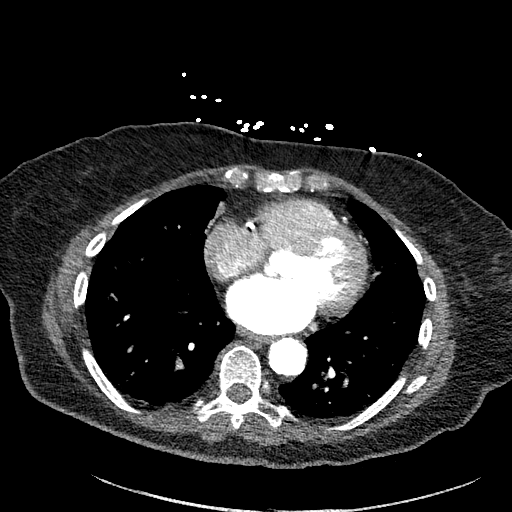
[im 260/309  soft-tissue]
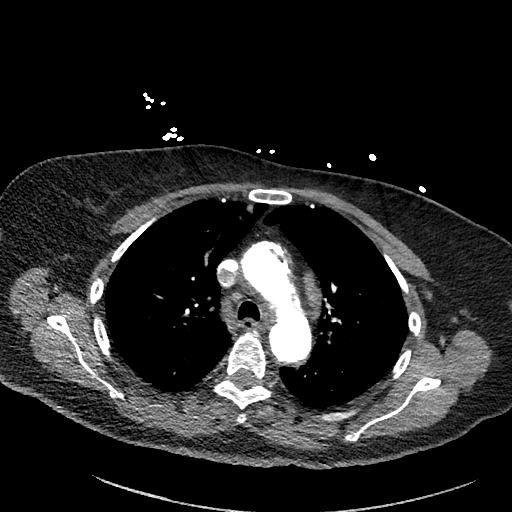
[im 260/309  bone]
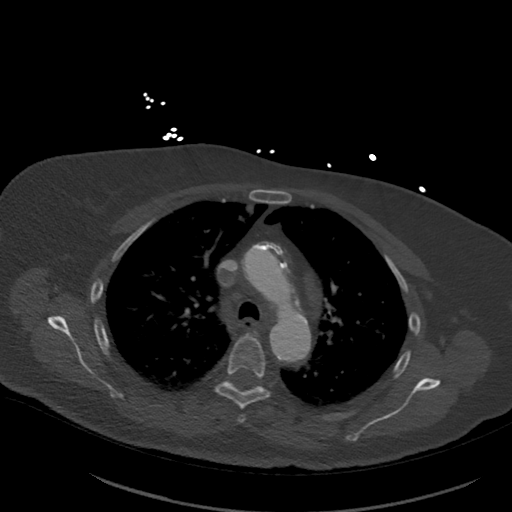
[im 292/309  soft-tissue]
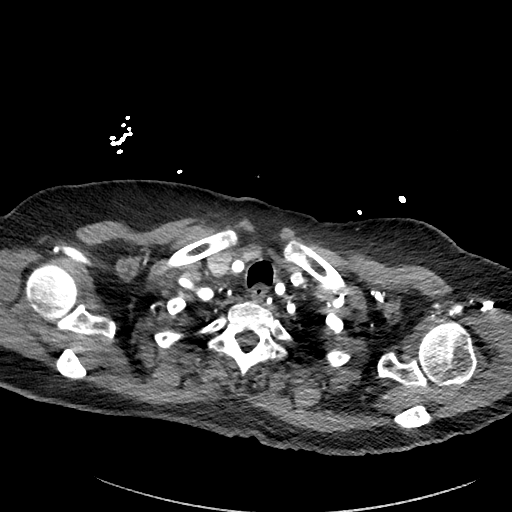

[Series 10: dissection 2mm cor · coronal · 0.76mm/px · 3 of 151 slices shown]
[im 38/151  soft-tissue]
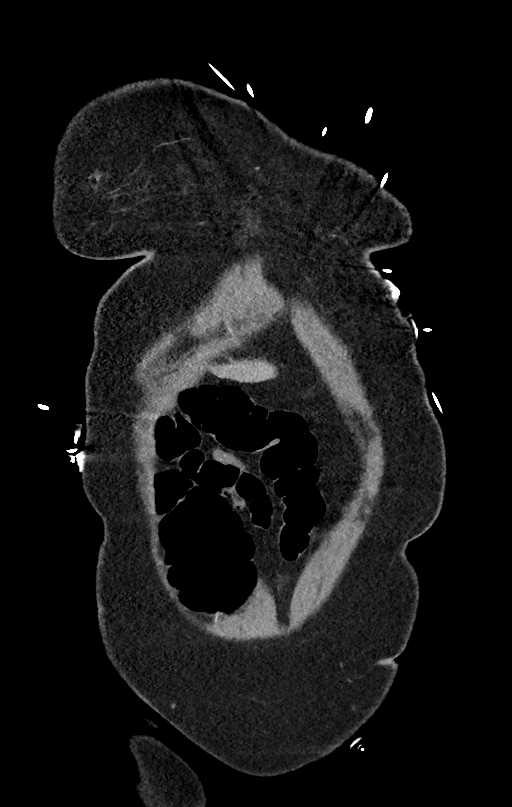
[im 76/151  soft-tissue]
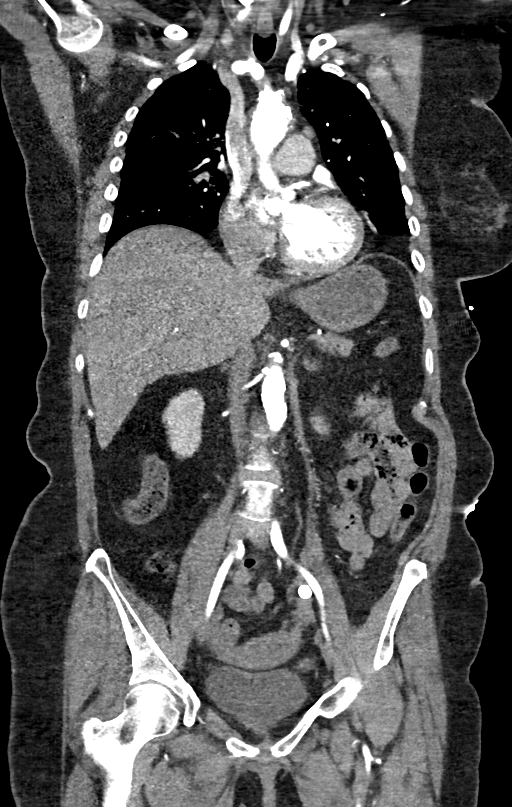
[im 113/151  soft-tissue]
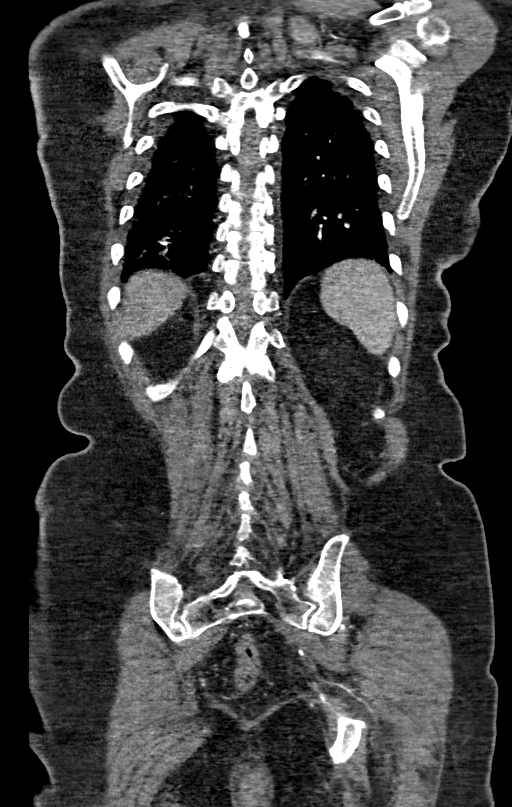

[13 of 46 positions shown; findings below may reference images not displayed]

Multidetector CT imaging through the chest, abdomen and pelvis was
performed using the standard protocol during bolus administration of
intravenous contrast. Multiplanar reconstructed images and MIPs were
obtained and reviewed to evaluate the vascular anatomy.

CONTRAST:  50mL OMNIPAQUE IOHEXOL 350 MG/ML SOLN
FINDINGS: CTA CHEST FINDINGS

Cardiovascular: The thoracic aorta is normal in caliber. No evidence
of intramural hematoma, dissection, or aneurysm. There is extensive
atherosclerotic calcification identified within the aortic arch and
proximal arch vasculature resulting in a less than 50% stenosis of
the left common carotid artery at its origin. Bovine arch anatomy
noted.

Pulmonary arterial caliber is within normal limits. Global cardiac
size is within normal limits. Moderate atherosclerotic calcification
of the coronary arteries. Moderate calcification of the mitral valve
annulus. The aortic valve is trileaflet.

Mediastinum/Nodes: There is shotty mediastinal adenopathy present
without frankly pathologic enlargement noted. Thyroid unremarkable.
Esophagus unremarkable.

Lungs/Pleura: Scattered nodular infiltrates within the right upper
lobe are nonspecific and may be infectious or inflammatory in
nature. Scarring noted within the right lung base. No pneumothorax
or pleural effusion. Central airways are widely patent.

Musculoskeletal: No acute bone abnormality.

Review of the MIP images confirms the above findings.

CTA ABDOMEN AND PELVIS FINDINGS

VASCULAR

Aorta: The abdominal aorta is normal in caliber; no evidence of
aneurysm or dissection. There is extensive atherosclerotic
calcification noted within the abdominal aorta. No evidence of
hemodynamically significant stenosis.

Celiac: Widely patent.  Conventional anatomy.  No aneurysm.

SMA: Widely patent.  Conventional anatomy.

Renals: Dual right and single left renal arteries are widely patent.
Normal arterial contour. No aneurysm.

IMA: Widely patent.

Inflow: Widely patent. Internal iliac arteries are patent
bilaterally.

Veins: Not well opacified.

Review of the MIP images confirms the above findings.

NON-VASCULAR

Hepatobiliary: The gallbladder is distended. A calcified 6 mm
gallstone is seen impacted within the gallbladder neck. The
gallbladder wall appears mildly edematous and, together, the
findings are suggestive of early changes of acute cholecystitis. No
significant internal or external biliary ductal dilation.

Pancreas: Unremarkable

Spleen: Unremarkable

Adrenals/Urinary Tract: The adrenal glands are unremarkable. Simple
cortical cysts are seen bilaterally. Mild renal cortical atrophy.
The kidneys are otherwise unremarkable. Bladder is unremarkable.

Stomach/Bowel: The stomach, small bowel, and large bowel are
unremarkable. The appendix is normal. Tiny fat containing umbilical
hernia. No free intraperitoneal gas. Tiny bilateral fat containing
inguinal hernias.

Lymphatic: No pathologic adenopathy within the abdomen and pelvis.

Reproductive: Uterus and bilateral adnexa are unremarkable.

Other: Rectum unremarkable

Musculoskeletal: No acute bone abnormality. Advanced degenerative
changes of the lumbosacral junction.

Review of the MIP images confirms the above findings.
IMPRESSION: No evidence of aortic aneurysm or dissection.

Impacted 6 mm gallstone within the a gallbladder neck with
gallbladder distension and edematous change suggestive of early
acute calculus cholecystitis. Correlation with liver enzymes and
physical examination is recommended.

Scattered nodular infiltrates within the right upper lobe,
nonspecific, possibly infectious or inflammatory in nature.

Aortic Atherosclerosis ([id number]).

## 2021-12-27 IMAGING — CR DG CHEST 2V
2 series · 2 of 2 positions shown · non-contrast
Comparison: April 22, 2015.

CLINICAL DATA: Chest pain.

EXAM:
CHEST - 2 VIEW

[chest lat]
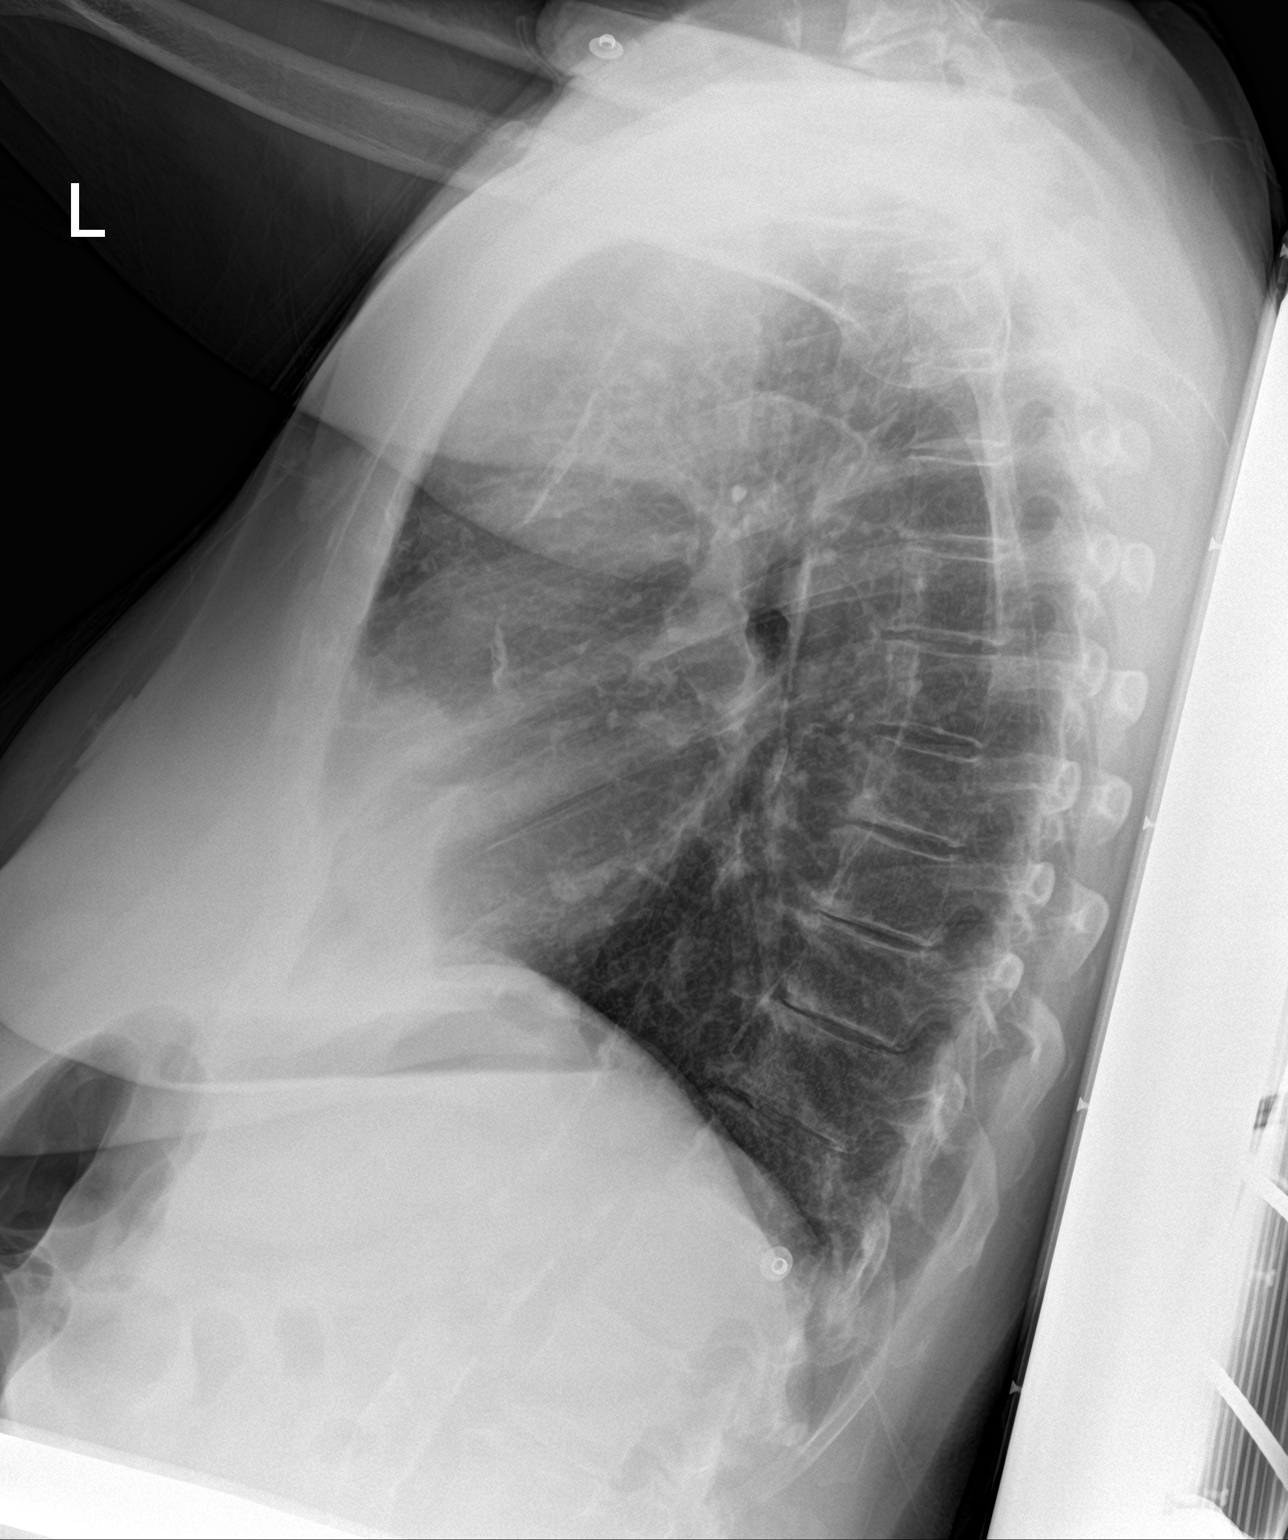

[chest ap]
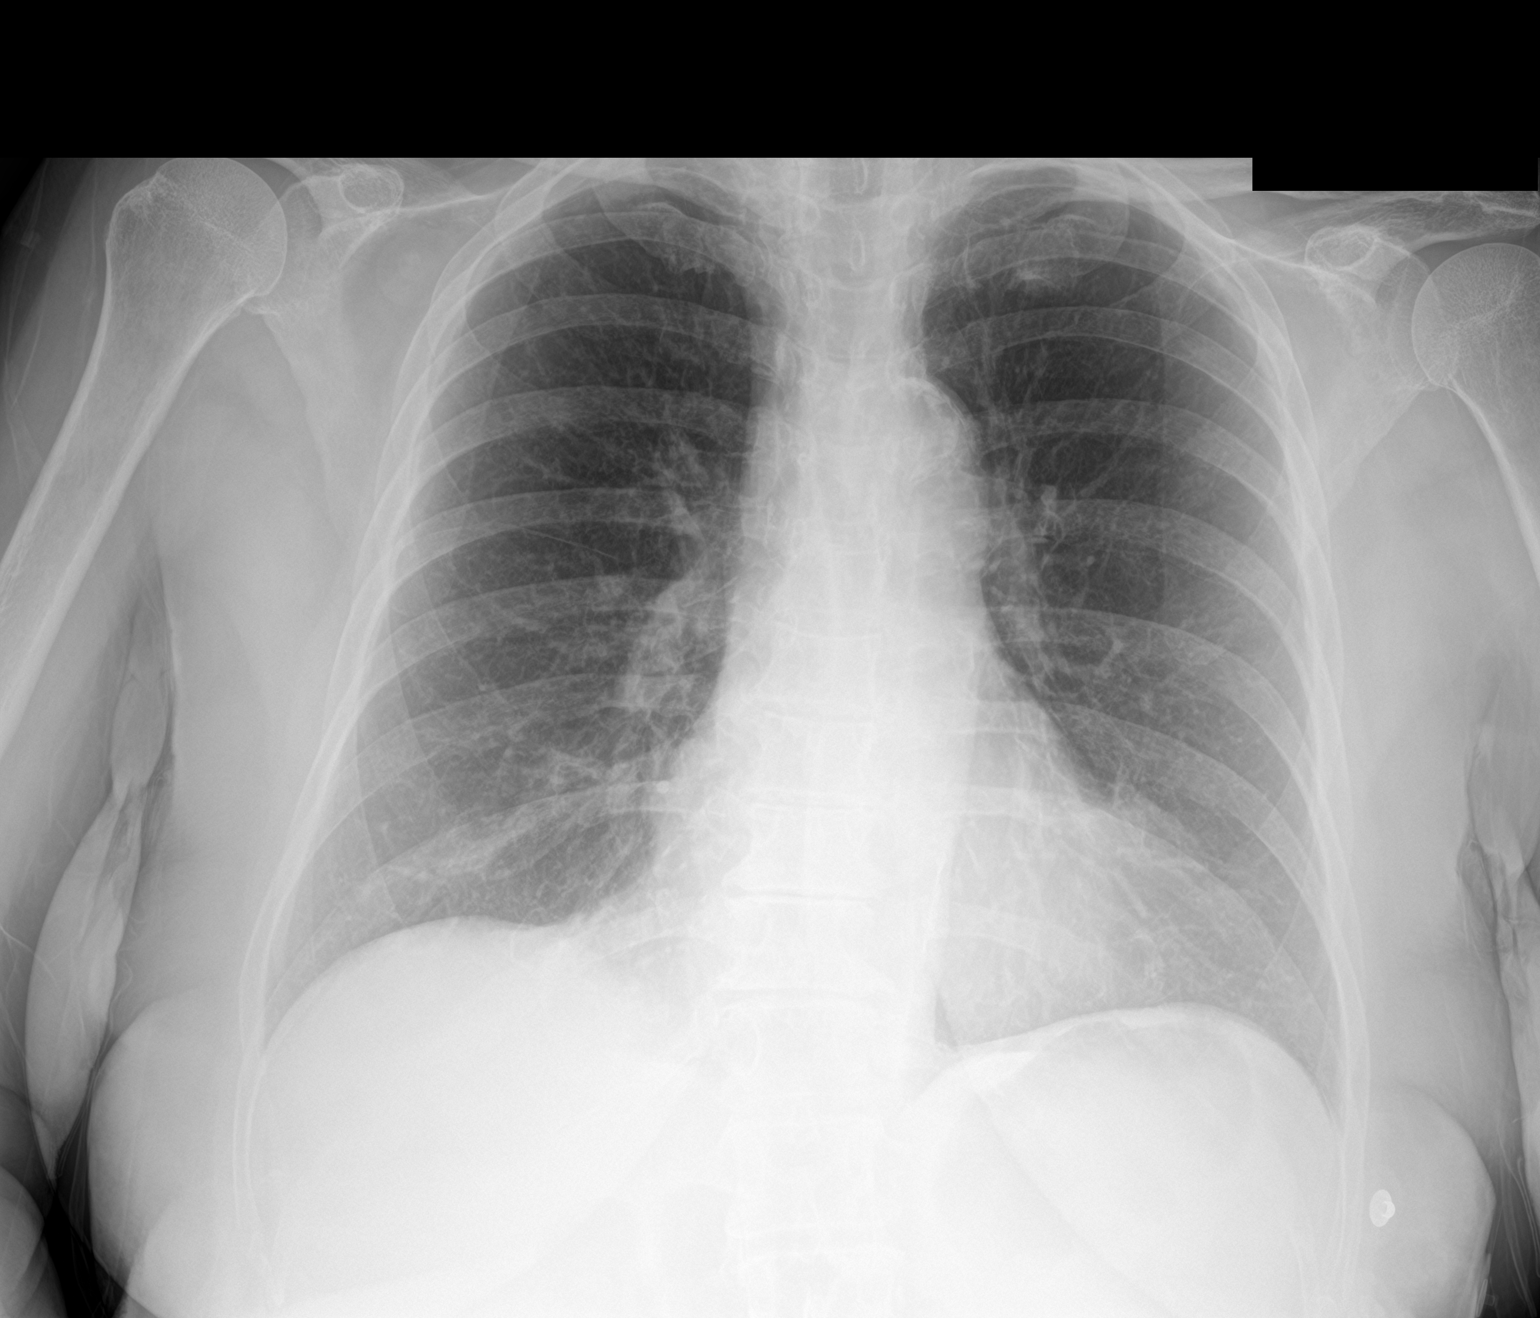

[2 of 2 positions shown; findings below may reference images not displayed]

FINDINGS: The heart size and mediastinal contours are within normal limits.
Both lungs are clear. No pneumothorax or pleural effusion is noted.
The visualized skeletal structures are unremarkable.
IMPRESSION: No active cardiopulmonary disease.

Aortic Atherosclerosis (ZFOPI-NEA.A).

## 2022-01-20 ENCOUNTER — Emergency Department (HOSPITAL_COMMUNITY)
Admission: EM | Admit: 2022-01-20 | Discharge: 2022-01-20 | Discharge status: Against Medical Advice | Payer: Medicare Other | Attending: Student | Admitting: Student

## 2022-01-20 ENCOUNTER — Emergency Department (HOSPITAL_COMMUNITY): Payer: Medicare Other

## 2022-01-20 ENCOUNTER — Encounter (HOSPITAL_COMMUNITY): Payer: Self-pay | Admitting: Emergency Medicine

## 2022-01-20 DIAGNOSIS — Z5321 Procedure and treatment not carried out due to patient leaving prior to being seen by health care provider: Secondary | ICD-10-CM | POA: Insufficient documentation

## 2022-01-20 DIAGNOSIS — K5900 Constipation, unspecified: Secondary | ICD-10-CM | POA: Diagnosis not present

## 2022-01-20 LAB — COMPREHENSIVE METABOLIC PANEL
ALT: 31 U/L (ref 0–44)
AST: 44 U/L — ABNORMAL HIGH (ref 15–41)
Albumin: 3.3 g/dL — ABNORMAL LOW (ref 3.5–5.0)
Alkaline Phosphatase: 68 U/L (ref 38–126)
Anion gap: 10 (ref 5–15)
BUN: 23 mg/dL (ref 8–23)
CO2: 28 mmol/L (ref 22–32)
Calcium: 9.1 mg/dL (ref 8.9–10.3)
Chloride: 101 mmol/L (ref 98–111)
Creatinine, Ser: 1.15 mg/dL — ABNORMAL HIGH (ref 0.44–1.00)
GFR, Estimated: 46 mL/min — ABNORMAL LOW (ref >60)
Glucose, Bld: 119 mg/dL — ABNORMAL HIGH (ref 70–99)
Potassium: 3.9 mmol/L (ref 3.5–5.1)
Sodium: 139 mmol/L (ref 135–145)
Total Bilirubin: 0.7 mg/dL (ref 0.3–1.2)
Total Protein: 6.6 g/dL (ref 6.5–8.1)

## 2022-01-20 LAB — CBC WITH DIFFERENTIAL/PLATELET
Abs Immature Granulocytes: 0.05 10*3/uL (ref 0.00–0.07)
Basophils Absolute: 0.1 10*3/uL (ref 0.0–0.1)
Basophils Relative: 1 %
Eosinophils Absolute: 0.2 10*3/uL (ref 0.0–0.5)
Eosinophils Relative: 2 %
HCT: 36.8 % (ref 36.0–46.0)
Hemoglobin: 12.4 g/dL (ref 12.0–15.0)
Immature Granulocytes: 0 %
Lymphocytes Relative: 18 %
Lymphs Abs: 2.1 10*3/uL (ref 0.7–4.0)
MCH: 32.9 pg (ref 26.0–34.0)
MCHC: 33.7 g/dL (ref 30.0–36.0)
MCV: 97.6 fL (ref 80.0–100.0)
Monocytes Absolute: 0.7 10*3/uL (ref 0.1–1.0)
Monocytes Relative: 6 %
Neutro Abs: 8.9 10*3/uL — ABNORMAL HIGH (ref 1.7–7.7)
Neutrophils Relative %: 73 %
Platelets: 278 10*3/uL (ref 150–400)
RBC: 3.77 MIL/uL — ABNORMAL LOW (ref 3.87–5.11)
RDW: 12.1 % (ref 11.5–15.5)
WBC: 12 10*3/uL — ABNORMAL HIGH (ref 4.0–10.5)
nRBC: 0 % (ref 0.0–0.2)

## 2022-01-20 LAB — LIPASE, BLOOD: Lipase: 35 U/L (ref 11–51)

## 2022-01-20 NOTE — ED Notes (Signed)
Pt had a bowel movement.  She now reports she took something before she left to assist her in having a bowel movement

## 2022-01-20 NOTE — ED Provider Triage Note (Signed)
Emergency Medicine Provider Triage Evaluation Note  Brandy Hicks , a 88 y.o. female  was evaluated in triage.  Pt via EMS from [facility] independent living complains of no BM x 2 weeks, no urine in >12 hours, though she feels she needs to urinate. NO pain, N/V/D, fevers, or chills. Hx of CKD stage 3.  Patient Dors history of constipation that has resulted in inability urinate in the past.  Review of Systems  Positive: As above Negative: As above  Physical Exam  There were no vitals taken for this visit. Gen:   Awake, no distress   Resp:  Normal effort  MSK:   Moves extremities without difficulty  Other:  Abdomen soft nondistended nontender, no CVAT.  Hypoactive bowel sounds  Medical Decision Making  Medically screening exam initiated at 4:03 AM.  Appropriate orders placed.  Brandy Hicks was informed that the remainder of the evaluation will be completed by another provider, this initial triage assessment does not replace that evaluation, and the importance of remaining in the ED until their evaluation is complete.  Patient with very small volume bowel movement in the ED.   This chart was dictated using voice recognition software, Dragon. Despite the best efforts of this provider to proofread and correct errors, errors may still occur which can change documentation meaning.    Emeline Darling, PA-C 01/20/22 0510

## 2022-01-20 NOTE — ED Triage Notes (Addendum)
Per EMS, pt from [facility], independent living, c/o no bowel movement in two week and has not urinated today.  No c/o pain, no n/v or bloating.    Pt is alert and oriented.  152/78 Pulse 56 RR 14 95% RA  Pt is more concerned about not urinating b/c she feels like she has to urinate.  Pt denies any discomfort w/ urination yesterday.

## 2022-01-20 NOTE — ED Notes (Signed)
Pt had a family member come to pick her up because she said she was feeling better. Moved pt OTF.

## 2022-01-31 DIAGNOSIS — E1159 Type 2 diabetes mellitus with other circulatory complications: Secondary | ICD-10-CM | POA: Diagnosis not present

## 2022-01-31 DIAGNOSIS — N189 Chronic kidney disease, unspecified: Secondary | ICD-10-CM | POA: Diagnosis not present

## 2022-01-31 DIAGNOSIS — N183 Chronic kidney disease, stage 3 unspecified: Secondary | ICD-10-CM | POA: Diagnosis not present

## 2022-01-31 DIAGNOSIS — E785 Hyperlipidemia, unspecified: Secondary | ICD-10-CM | POA: Diagnosis not present

## 2022-01-31 DIAGNOSIS — G610 Guillain-Barre syndrome: Secondary | ICD-10-CM | POA: Diagnosis not present

## 2022-01-31 DIAGNOSIS — E039 Hypothyroidism, unspecified: Secondary | ICD-10-CM | POA: Diagnosis not present

## 2022-01-31 DIAGNOSIS — I10 Essential (primary) hypertension: Secondary | ICD-10-CM | POA: Diagnosis not present

## 2022-03-12 ENCOUNTER — Ambulatory Visit: Payer: Medicare Other | Admitting: Podiatry

## 2022-03-13 ENCOUNTER — Ambulatory Visit: Payer: Medicare Other | Admitting: Podiatry

## 2022-03-13 DIAGNOSIS — I10 Essential (primary) hypertension: Secondary | ICD-10-CM | POA: Diagnosis not present

## 2022-03-13 DIAGNOSIS — I700 Atherosclerosis of aorta: Secondary | ICD-10-CM | POA: Diagnosis not present

## 2022-03-13 DIAGNOSIS — U071 COVID-19: Secondary | ICD-10-CM | POA: Diagnosis not present

## 2022-03-13 DIAGNOSIS — R051 Acute cough: Secondary | ICD-10-CM | POA: Diagnosis not present

## 2022-05-16 DIAGNOSIS — J189 Pneumonia, unspecified organism: Secondary | ICD-10-CM

## 2022-05-16 HISTORY — DX: Pneumonia, unspecified organism: J18.9

## 2022-05-19 ENCOUNTER — Encounter (HOSPITAL_COMMUNITY): Payer: Self-pay

## 2022-05-19 ENCOUNTER — Emergency Department (HOSPITAL_COMMUNITY): Payer: Medicare HMO

## 2022-05-19 ENCOUNTER — Inpatient Hospital Stay (HOSPITAL_COMMUNITY)
Admission: EM | Admit: 2022-05-19 | Discharge: 2022-05-24 | DRG: 871 | Disposition: A | Discharge status: Home / Self Care | Payer: Medicare HMO | Attending: Internal Medicine | Admitting: Internal Medicine

## 2022-05-19 ENCOUNTER — Other Ambulatory Visit: Payer: Self-pay

## 2022-05-19 DIAGNOSIS — E871 Hypo-osmolality and hyponatremia: Secondary | ICD-10-CM | POA: Diagnosis present

## 2022-05-19 DIAGNOSIS — M810 Age-related osteoporosis without current pathological fracture: Secondary | ICD-10-CM | POA: Diagnosis present

## 2022-05-19 DIAGNOSIS — E785 Hyperlipidemia, unspecified: Secondary | ICD-10-CM | POA: Diagnosis present

## 2022-05-19 DIAGNOSIS — A4151 Sepsis due to Escherichia coli [E. coli]: Secondary | ICD-10-CM | POA: Insufficient documentation

## 2022-05-19 DIAGNOSIS — R6521 Severe sepsis with septic shock: Secondary | ICD-10-CM | POA: Diagnosis present

## 2022-05-19 DIAGNOSIS — N179 Acute kidney failure, unspecified: Secondary | ICD-10-CM | POA: Diagnosis present

## 2022-05-19 DIAGNOSIS — I700 Atherosclerosis of aorta: Secondary | ICD-10-CM | POA: Diagnosis present

## 2022-05-19 DIAGNOSIS — Z7982 Long term (current) use of aspirin: Secondary | ICD-10-CM

## 2022-05-19 DIAGNOSIS — E861 Hypovolemia: Secondary | ICD-10-CM | POA: Diagnosis present

## 2022-05-19 DIAGNOSIS — A419 Sepsis, unspecified organism: Secondary | ICD-10-CM | POA: Diagnosis not present

## 2022-05-19 DIAGNOSIS — N390 Urinary tract infection, site not specified: Secondary | ICD-10-CM | POA: Diagnosis present

## 2022-05-19 DIAGNOSIS — Z803 Family history of malignant neoplasm of breast: Secondary | ICD-10-CM

## 2022-05-19 DIAGNOSIS — E876 Hypokalemia: Secondary | ICD-10-CM | POA: Diagnosis present

## 2022-05-19 DIAGNOSIS — R234 Changes in skin texture: Secondary | ICD-10-CM | POA: Diagnosis present

## 2022-05-19 DIAGNOSIS — F40240 Claustrophobia: Secondary | ICD-10-CM | POA: Diagnosis present

## 2022-05-19 DIAGNOSIS — R7881 Bacteremia: Secondary | ICD-10-CM | POA: Diagnosis not present

## 2022-05-19 DIAGNOSIS — R739 Hyperglycemia, unspecified: Secondary | ICD-10-CM | POA: Diagnosis present

## 2022-05-19 DIAGNOSIS — J432 Centrilobular emphysema: Secondary | ICD-10-CM | POA: Diagnosis present

## 2022-05-19 DIAGNOSIS — J9601 Acute respiratory failure with hypoxia: Secondary | ICD-10-CM | POA: Diagnosis not present

## 2022-05-19 DIAGNOSIS — Z8673 Personal history of transient ischemic attack (TIA), and cerebral infarction without residual deficits: Secondary | ICD-10-CM

## 2022-05-19 DIAGNOSIS — G250 Essential tremor: Secondary | ICD-10-CM | POA: Diagnosis present

## 2022-05-19 DIAGNOSIS — Z823 Family history of stroke: Secondary | ICD-10-CM

## 2022-05-19 DIAGNOSIS — N1832 Chronic kidney disease, stage 3b: Secondary | ICD-10-CM | POA: Diagnosis present

## 2022-05-19 DIAGNOSIS — Z1152 Encounter for screening for COVID-19: Secondary | ICD-10-CM

## 2022-05-19 DIAGNOSIS — Z79899 Other long term (current) drug therapy: Secondary | ICD-10-CM

## 2022-05-19 DIAGNOSIS — E440 Moderate protein-calorie malnutrition: Secondary | ICD-10-CM | POA: Insufficient documentation

## 2022-05-19 DIAGNOSIS — R339 Retention of urine, unspecified: Secondary | ICD-10-CM | POA: Diagnosis present

## 2022-05-19 DIAGNOSIS — R6510 Systemic inflammatory response syndrome (SIRS) of non-infectious origin without acute organ dysfunction: Secondary | ICD-10-CM | POA: Diagnosis present

## 2022-05-19 DIAGNOSIS — A491 Streptococcal infection, unspecified site: Secondary | ICD-10-CM | POA: Diagnosis not present

## 2022-05-19 DIAGNOSIS — Z8249 Family history of ischemic heart disease and other diseases of the circulatory system: Secondary | ICD-10-CM

## 2022-05-19 DIAGNOSIS — R54 Age-related physical debility: Secondary | ICD-10-CM | POA: Diagnosis present

## 2022-05-19 DIAGNOSIS — F32A Depression, unspecified: Secondary | ICD-10-CM | POA: Diagnosis present

## 2022-05-19 DIAGNOSIS — J13 Pneumonia due to Streptococcus pneumoniae: Secondary | ICD-10-CM | POA: Diagnosis present

## 2022-05-19 DIAGNOSIS — Z9049 Acquired absence of other specified parts of digestive tract: Secondary | ICD-10-CM

## 2022-05-19 DIAGNOSIS — R601 Generalized edema: Secondary | ICD-10-CM | POA: Diagnosis present

## 2022-05-19 DIAGNOSIS — I129 Hypertensive chronic kidney disease with stage 1 through stage 4 chronic kidney disease, or unspecified chronic kidney disease: Secondary | ICD-10-CM | POA: Diagnosis present

## 2022-05-19 DIAGNOSIS — B9620 Unspecified Escherichia coli [E. coli] as the cause of diseases classified elsewhere: Secondary | ICD-10-CM | POA: Diagnosis not present

## 2022-05-19 DIAGNOSIS — Z87891 Personal history of nicotine dependence: Secondary | ICD-10-CM

## 2022-05-19 DIAGNOSIS — R509 Fever, unspecified: Secondary | ICD-10-CM

## 2022-05-19 DIAGNOSIS — Z79890 Hormone replacement therapy (postmenopausal): Secondary | ICD-10-CM

## 2022-05-19 DIAGNOSIS — E039 Hypothyroidism, unspecified: Secondary | ICD-10-CM | POA: Diagnosis present

## 2022-05-19 DIAGNOSIS — Z8042 Family history of malignant neoplasm of prostate: Secondary | ICD-10-CM

## 2022-05-19 DIAGNOSIS — Z820 Family history of epilepsy and other diseases of the nervous system: Secondary | ICD-10-CM

## 2022-05-19 DIAGNOSIS — Z888 Allergy status to other drugs, medicaments and biological substances status: Secondary | ICD-10-CM

## 2022-05-19 DIAGNOSIS — Z66 Do not resuscitate: Secondary | ICD-10-CM | POA: Diagnosis present

## 2022-05-19 DIAGNOSIS — Z6821 Body mass index (BMI) 21.0-21.9, adult: Secondary | ICD-10-CM

## 2022-05-19 LAB — BASIC METABOLIC PANEL
Anion gap: 11 (ref 5–15)
BUN: 36 mg/dL — ABNORMAL HIGH (ref 8–23)
CO2: 25 mmol/L (ref 22–32)
Calcium: 8.3 mg/dL — ABNORMAL LOW (ref 8.9–10.3)
Chloride: 93 mmol/L — ABNORMAL LOW (ref 98–111)
Creatinine, Ser: 1.58 mg/dL — ABNORMAL HIGH (ref 0.44–1.00)
GFR, Estimated: 31 mL/min — ABNORMAL LOW (ref >60)
Glucose, Bld: 128 mg/dL — ABNORMAL HIGH (ref 70–99)
Potassium: 2.9 mmol/L — ABNORMAL LOW (ref 3.5–5.1)
Sodium: 129 mmol/L — ABNORMAL LOW (ref 135–145)

## 2022-05-19 LAB — CBC
HCT: 34.6 % — ABNORMAL LOW (ref 36.0–46.0)
Hemoglobin: 11.8 g/dL — ABNORMAL LOW (ref 12.0–15.0)
MCH: 32.5 pg (ref 26.0–34.0)
MCHC: 34.1 g/dL (ref 30.0–36.0)
MCV: 95.3 fL (ref 80.0–100.0)
Platelets: 202 10*3/uL (ref 150–400)
RBC: 3.63 MIL/uL — ABNORMAL LOW (ref 3.87–5.11)
RDW: 13.2 % (ref 11.5–15.5)
WBC: 19 10*3/uL — ABNORMAL HIGH (ref 4.0–10.5)
nRBC: 0 % (ref 0.0–0.2)

## 2022-05-19 LAB — COMPREHENSIVE METABOLIC PANEL
ALT: 22 U/L (ref 0–44)
AST: 38 U/L (ref 15–41)
Albumin: 2.6 g/dL — ABNORMAL LOW (ref 3.5–5.0)
Alkaline Phosphatase: 109 U/L (ref 38–126)
Anion gap: 6 (ref 5–15)
BUN: 34 mg/dL — ABNORMAL HIGH (ref 8–23)
CO2: 21 mmol/L — ABNORMAL LOW (ref 22–32)
Calcium: 6.8 mg/dL — ABNORMAL LOW (ref 8.9–10.3)
Chloride: 107 mmol/L (ref 98–111)
Creatinine, Ser: 1.67 mg/dL — ABNORMAL HIGH (ref 0.44–1.00)
GFR, Estimated: 29 mL/min — ABNORMAL LOW (ref >60)
Glucose, Bld: 103 mg/dL — ABNORMAL HIGH (ref 70–99)
Potassium: 3.3 mmol/L — ABNORMAL LOW (ref 3.5–5.1)
Sodium: 134 mmol/L — ABNORMAL LOW (ref 135–145)
Total Bilirubin: 0.5 mg/dL (ref 0.3–1.2)
Total Protein: 5 g/dL — ABNORMAL LOW (ref 6.5–8.1)

## 2022-05-19 LAB — URINALYSIS, W/ REFLEX TO CULTURE (INFECTION SUSPECTED)
Bilirubin Urine: NEGATIVE
Glucose, UA: NEGATIVE mg/dL
Ketones, ur: NEGATIVE mg/dL
Nitrite: NEGATIVE
Protein, ur: 100 mg/dL — AB
Specific Gravity, Urine: 1.014 (ref 1.005–1.030)
WBC, UA: >50 WBC/hpf (ref 0–5)
pH: 5 (ref 5.0–8.0)

## 2022-05-19 LAB — RESP PANEL BY RT-PCR (RSV, FLU A&B, COVID)  RVPGX2
Influenza A by PCR: NEGATIVE
Influenza B by PCR: NEGATIVE
Resp Syncytial Virus by PCR: NEGATIVE
SARS Coronavirus 2 by RT PCR: NEGATIVE

## 2022-05-19 LAB — LACTIC ACID, PLASMA
Lactic Acid, Venous: 1.6 mmol/L (ref 0.5–1.9)
Lactic Acid, Venous: 1.6 mmol/L (ref 0.5–1.9)

## 2022-05-19 LAB — CK: Total CK: 118 U/L (ref 38–234)

## 2022-05-19 LAB — MAGNESIUM: Magnesium: 1.7 mg/dL (ref 1.7–2.4)

## 2022-05-19 MED ORDER — MELATONIN 5 MG PO TABS
10.0000 mg | ORAL_TABLET | Freq: Every day | ORAL | Status: DC
  Administered 2022-05-19 – 2022-05-20 (×2): 10 mg via ORAL
  Filled 2022-05-19 (×4): qty 2

## 2022-05-19 MED ORDER — FLUTICASONE PROPIONATE 50 MCG/ACT NA SUSP
2.0000 | Freq: Every day | NASAL | Status: DC
  Administered 2022-05-24: 2 via NASAL
  Filled 2022-05-19: qty 16

## 2022-05-19 MED ORDER — POTASSIUM CHLORIDE 10 MEQ/100ML IV SOLN
10.0000 meq | INTRAVENOUS | Status: AC
  Administered 2022-05-19 (×4): 10 meq via INTRAVENOUS
  Filled 2022-05-19 (×4): qty 100

## 2022-05-19 MED ORDER — ENOXAPARIN SODIUM 30 MG/0.3ML IJ SOSY
30.0000 mg | PREFILLED_SYRINGE | INTRAMUSCULAR | Status: DC
  Administered 2022-05-19 – 2022-05-23 (×5): 30 mg via SUBCUTANEOUS
  Filled 2022-05-19 (×5): qty 0.3

## 2022-05-19 MED ORDER — ONDANSETRON HCL 4 MG PO TABS: 4.0000 mg | ORAL_TABLET | Freq: Four times a day (QID) | ORAL | Status: DC | PRN

## 2022-05-19 MED ORDER — SODIUM CHLORIDE 0.9 % IV SOLN
2.0000 g | INTRAVENOUS | Status: DC
  Administered 2022-05-20 – 2022-05-21 (×2): 2 g via INTRAVENOUS
  Filled 2022-05-19 (×2): qty 20

## 2022-05-19 MED ORDER — POTASSIUM CHLORIDE IN NACL 20-0.9 MEQ/L-% IV SOLN
INTRAVENOUS | Status: DC
  Filled 2022-05-19 (×3): qty 1000

## 2022-05-19 MED ORDER — SODIUM CHLORIDE 0.9 % IV SOLN
1000.0000 mL | INTRAVENOUS | Status: DC
  Administered 2022-05-19 (×2): 1000 mL via INTRAVENOUS

## 2022-05-19 MED ORDER — SODIUM CHLORIDE 0.9 % IV BOLUS
500.0000 mL | Freq: Once | INTRAVENOUS | Status: AC
  Administered 2022-05-19: 500 mL via INTRAVENOUS

## 2022-05-19 MED ORDER — LEVOTHYROXINE SODIUM 75 MCG PO TABS
150.0000 ug | ORAL_TABLET | Freq: Every day | ORAL | Status: DC
  Administered 2022-05-20: 150 ug via ORAL
  Filled 2022-05-19: qty 2

## 2022-05-19 MED ORDER — ACETAMINOPHEN 500 MG PO TABS
1000.0000 mg | ORAL_TABLET | Freq: Once | ORAL | Status: AC
  Administered 2022-05-19: 1000 mg via ORAL
  Filled 2022-05-19: qty 2

## 2022-05-19 MED ORDER — VITAMIN D 25 MCG (1000 UNIT) PO TABS
5000.0000 [IU] | ORAL_TABLET | Freq: Every day | ORAL | Status: DC
  Administered 2022-05-19 – 2022-05-21 (×3): 5000 [IU] via ORAL
  Filled 2022-05-19 (×3): qty 5

## 2022-05-19 MED ORDER — ORAL CARE MOUTH RINSE: 15.0000 mL | OROMUCOSAL | Status: DC | PRN

## 2022-05-19 MED ORDER — SERTRALINE HCL 50 MG PO TABS
50.0000 mg | ORAL_TABLET | Freq: Every day | ORAL | Status: DC
  Administered 2022-05-19 – 2022-05-24 (×6): 50 mg via ORAL
  Filled 2022-05-19 (×6): qty 1

## 2022-05-19 MED ORDER — POTASSIUM CHLORIDE CRYS ER 20 MEQ PO TBCR
40.0000 meq | EXTENDED_RELEASE_TABLET | Freq: Once | ORAL | Status: AC
  Administered 2022-05-19: 40 meq via ORAL
  Filled 2022-05-19: qty 2

## 2022-05-19 MED ORDER — SODIUM CHLORIDE 0.9 % IV SOLN
1.0000 g | Freq: Once | INTRAVENOUS | Status: DC
  Filled 2022-05-19: qty 10

## 2022-05-19 MED ORDER — SENNOSIDES-DOCUSATE SODIUM 8.6-50 MG PO TABS: 1.0000 | ORAL_TABLET | Freq: Every evening | ORAL | Status: DC | PRN

## 2022-05-19 MED ORDER — PRIMIDONE 50 MG PO TABS
200.0000 mg | ORAL_TABLET | Freq: Two times a day (BID) | ORAL | Status: DC
  Administered 2022-05-20 – 2022-05-24 (×10): 200 mg via ORAL
  Filled 2022-05-19 (×11): qty 4

## 2022-05-19 MED ORDER — ASPIRIN 81 MG PO CHEW
81.0000 mg | CHEWABLE_TABLET | Freq: Every day | ORAL | Status: DC
  Administered 2022-05-19 – 2022-05-24 (×6): 81 mg via ORAL
  Filled 2022-05-19 (×6): qty 1

## 2022-05-19 MED ORDER — ONDANSETRON HCL 4 MG/2ML IJ SOLN
4.0000 mg | Freq: Four times a day (QID) | INTRAMUSCULAR | Status: DC | PRN
  Administered 2022-05-20: 4 mg via INTRAVENOUS
  Filled 2022-05-19: qty 2

## 2022-05-19 MED ORDER — ACETAMINOPHEN 650 MG RE SUPP: 650.0000 mg | Freq: Four times a day (QID) | RECTAL | Status: DC | PRN

## 2022-05-19 MED ORDER — SODIUM CHLORIDE 0.9 % IV BOLUS (SEPSIS)
500.0000 mL | Freq: Once | INTRAVENOUS | Status: AC
  Administered 2022-05-19: 500 mL via INTRAVENOUS

## 2022-05-19 MED ORDER — CHLORHEXIDINE GLUCONATE CLOTH 2 % EX PADS
6.0000 | MEDICATED_PAD | Freq: Every day | CUTANEOUS | Status: DC
  Administered 2022-05-19 – 2022-05-24 (×5): 6 via TOPICAL

## 2022-05-19 MED ORDER — SODIUM CHLORIDE 0.9 % IV SOLN
2.0000 g | Freq: Once | INTRAVENOUS | Status: AC
  Administered 2022-05-19: 2 g via INTRAVENOUS
  Filled 2022-05-19: qty 20

## 2022-05-19 MED ORDER — ADULT MULTIVITAMIN W/MINERALS CH
1.0000 | ORAL_TABLET | Freq: Every day | ORAL | Status: DC
  Administered 2022-05-20 – 2022-05-24 (×5): 1 via ORAL
  Filled 2022-05-19 (×5): qty 1

## 2022-05-19 MED ORDER — ATORVASTATIN CALCIUM 40 MG PO TABS
40.0000 mg | ORAL_TABLET | Freq: Every day | ORAL | Status: DC
  Administered 2022-05-19 – 2022-05-23 (×5): 40 mg via ORAL
  Filled 2022-05-19 (×5): qty 1

## 2022-05-19 MED ORDER — ACETAMINOPHEN 325 MG PO TABS
650.0000 mg | ORAL_TABLET | Freq: Four times a day (QID) | ORAL | Status: DC | PRN
  Administered 2022-05-19: 650 mg via ORAL
  Filled 2022-05-19: qty 2

## 2022-05-19 NOTE — Group Note (Unsigned)
Date:  05/19/2022 Time:  10:23 AM  Group Topic/Focus:  Goals Group:   The focus of this group is to help patients establish daily goals to achieve during treatment and discuss how the patient can incorporate goal setting into their daily lives to aide in recovery. Orientation:   The focus of this group is to educate the patient on the purpose and policies of crisis stabilization and provide a format to answer questions about their admission.  The group details unit policies and expectations of patients while admitted.     Participation Level:  {BHH PARTICIPATION HD:[id number]  Participation Quality:  {BHH PARTICIPATION QUALITY:22265}  Affect:  {BHH AFFECT:22266}  Cognitive:  {BHH COGNITIVE:22267}  Insight: {BHH Insight2:20797}  Engagement in Group:  {BHH ENGAGEMENT IN J[place]:[id number]  Modes of Intervention:  {BHH MODES OF INTERVENTION:22269}  Additional Comments:  ***  Dub Mikes 05/19/2022, 10:23 AM

## 2022-05-19 NOTE — Hospital Course (Addendum)
89yo f w/ PH of HTN,essential tremor, hyperlipidemia, osteoporosis, hypothyroidism, Guillain-Barr syndrome about 5 years ago, CKD3b b/l creat of 1-1.2 presented to the ED with 3 days of Weakness  and shaky, worsening of baseline tremors. She c/o chills but no fever. Patient denies any nausea, vomiting, chest pain, shortness of breath,headache, focal weakness, numbness tingling, speech difficulties. She c/o food not being good at [employer] She was much worse today with difficulty standing walking getting up.  EMS was called noted fever of 102.  Patient denies any shortness of breath cough or respiratory symptoms sore throat nausea vomiting diarrhea.  In the ED, vital signs showed fever 103.1 respiration 24 BP 132/38 saturating 99% on room air, labs showed hyponatremia 129 hypokalemia 2.9 elevated BUN/creatinine 36/1.5 left acid 1.6 with leukocytosis 19 3 respiratory virus COVID/influenza RSV negative blood culture sent UA ordered pending, given a liter bolus, ceftriaxone, BP noted to be down 94/43> at this time half liter bolus ordered, repeat lactic acid ordered> I&O ordered to get [place] sample.  She was given 40kcl po. Admission was requested for further management. Current BP is in 108/42, she c/o feeling chills, no numbness or tingling or weakness of legs or speech difficulties or swallowing difficulties.

## 2022-05-19 NOTE — ED Notes (Signed)
Pt transported upstairs in stable condition.

## 2022-05-19 NOTE — Progress Notes (Addendum)
ICU Consult    NAME:  Brandy Hicks, MRN:  [id number], DOB:  [birth year], LOS: 0 ADMISSION DATE:  05/19/2022, CONSULTATION DATE:  05/19/2022 REFERRING :  [name] ACNPC-AG  CHIEF COMPLAINT:  Worsening Sepsis  Brief History   89 year old female prior history of hypertension, essential tremor, hyperlipidemia, osteoporosis, hypothyroidism, Guillain-Barr syndrome, CKD 3B.  History of present illness   3 days of weakness and shaky worsening baseline tremors. Arrived to the ER via EMS fever 103.93F Tachypneic in ER but saturating 99% on room air. Found to be hyponatremic, hypokalemic, elevated BUN/creatinine. She was given 1 L bolus in ER Blood pressure was 108/42 at 1636 hrs.    Consults:  CCM  Significant Diagnostic Tests:  CMP Lactic acid - pending 0000 hrs  Micro Data:    Latest Reference Range & Units 05/19/22 16:40  Appearance CLEAR  HAZY !  Bilirubin Urine NEGATIVE  NEGATIVE  Color, Urine YELLOW  YELLOW  Glucose, UA NEGATIVE mg/dL NEGATIVE  Hgb urine dipstick NEGATIVE  SMALL !  Ketones, ur NEGATIVE mg/dL NEGATIVE  Leukocytes,Ua NEGATIVE  MODERATE !  Nitrite NEGATIVE  NEGATIVE  pH 5.0 - 8.0  5.0  Protein NEGATIVE mg/dL 100 !  Specific Gravity, Urine 1.005 - 1.030  1.014  Specimen Source  URINE, CLEAN CATCH  !: Data is abnormal  Antimicrobials:  Rocephin 2g Q day  Interim history/subjective:  1 L of fluid normal saline bolus given Temperature has decreased below 100F Patient maintains mentation  Sodium is now 134      previous 129 Potassium is now 3.3    previous 2.9 Magnesium is 1.7 Albumin is 2.6 GFR is 29   previous 31   history of CKD 3B   Objective   Blood pressure (!) 88/23, pulse 88, temperature 99.8 F (37.7 C), temperature source Oral, resp. rate (!) 26, height '5\' 1"'$  (1.549 m), weight 52.2 kg,  SpO2 99 %.        Intake/Output Summary (Last 24 hours) at 05/19/2022 2330 Last data filed at 05/19/2022 2234 Gross per 24 hour  Intake 3751.31 ml  Output --  Net 3751.31 ml   Filed Weights   05/19/22 1121  Weight: 52.2 kg    Examination: General: alert/oriented x3 HENT: dry  Lungs: CTAB, normal work of breathing Cardiovascular: RRR, no murmur, no JVD  Abdomen: soft, nontender, normal bowel sounds Neuro:  follows commands, existing baseline tremors    Assessment & Plan:  CCM consult  continue IVF   Best practice:  Diet: Regular   DVT prophylaxis: Lovenox, (Aspirin) Mobility: As tolerated  Code Status: Partial Code  Disposition: Remain in room, change of admission  status   Labs   CBC: Recent Labs  Lab 05/19/22 1127  WBC 19.0*  HGB 11.8*  HCT 34.6*  MCV 95.3  PLT [id number]    Basic Metabolic Panel: Recent Labs  Lab 05/19/22 1127 05/19/22 2233  NA 129* 134*  K 2.9* 3.3*  CL 93* 107  CO2 25 21*  GLUCOSE 128* 103*  BUN 36* 34*  CREATININE 1.58* 1.67*  CALCIUM 8.3* 6.8*  MG  --  1.7   GFR: Estimated Creatinine Clearance: 17.9 mL/min (A) (by C-G formula based on SCr of 1.67 mg/dL (H)). Recent Labs  Lab 05/19/22 1127 05/19/22 1625  WBC 19.0*  --   LATICACIDVEN 1.6 1.6    Liver Function Tests: Recent Labs  Lab 05/19/22 2233  AST 38  ALT 22  ALKPHOS 109  BILITOT 0.5  PROT 5.0*  ALBUMIN 2.6*   No results for input(s): "LIPASE", "AMYLASE" in the last 168 hours. No results for input(s): "AMMONIA" in the last 168 hours.  ABG No results found for: "PHART", "PCO2ART", "PO2ART", "HCO3", "TCO2", "ACIDBASEDEF", "O2SAT"   Coagulation Profile: No results for input(s): "INR", "PROTIME" in the last 168 hours.  Cardiac Enzymes: Recent Labs  Lab 05/19/22 2233  CKTOTAL 118    HbA1C: No results found for: "HGBA1C"  CBG: No results for input(s): "GLUCAP" in the last 168 hours.   Corrected Calcium 7.5 by Calculation  Past Medical History  She,   has a past medical history of Chronic kidney disease, stage 3 unspecified ([place]), Chronic kidney disease, unspecified, Constipation, unspecified, Depression, Essential hypertension, Essential tremor, Guillain Barr syndrome ([place]), Hormone replacement therapy, Hyperlipidemia, Hypertension, Hypothyroid, Hypothyroidism, unspecified, Impacted cerumen of both ears, Impacted cerumen of right ear, Osteoporosis, Ovarian cyst, Radiculopathy, thoracic region, Retention of urine, unspecified, Rosacea, Seborrheic dermatitis, unspecified, Somnolence, Thoracic spine pain, TIA (transient ischemic attack), Tremor, and Unspecified mood (affective) disorder ([place]).   Surgical History    Past Surgical History:  Procedure Laterality Date   CHOLECYSTECTOMY N/A 01/03/2020   Procedure: LAPAROSCOPIC CHOLECYSTECTOMY;  Surgeon: Erroll Luna, MD;  Location: [place];  Service: General;  Laterality: N/A;   FACIAL COSMETIC SURGERY     GALLBLADDER SURGERY     Post [place] Gallbladder Removal    HEEL SPUR SURGERY     HEEL SPUR SURGERY     TONSILLECTOMY       Social History   reports that she has quit smoking. She has never used smokeless tobacco. She reports that she does not drink alcohol and does not use drugs.   Family History   Her family history includes Breast cancer in her daughter; CAD in her father; Heart murmur in her daughter; Hypertension in her daughter, daughter, and father; Kidney disease in her daughter; Liver disease in her sister; Multiple sclerosis in her daughter; Prostate cancer in her father; Stroke in her mother; Thyroid disease in her daughter.   Allergies Allergies  Allergen Reactions   Hydroquinone     Pinkness and edema of face and eyelids, severe     Home Medications  Prior to Admission medications   Medication Sig Start Date End Date Taking? Authorizing Provider  acetaminophen (TYLENOL) 500 MG tablet Take 500 mg by mouth every 6 (six) hours as needed for moderate pain.   Yes [provider]  amLODipine (NORVASC) 10 MG tablet Take 10 mg by mouth daily.   Yes [provider]  aspirin 81 MG tablet Take 81 mg by mouth daily.   Yes [provider]  atorvastatin (  LIPITOR) 40 MG tablet Take 40 mg by mouth daily.   Yes [provider]  chlorthalidone (HYGROTON) 25 MG tablet Take 1 tablet (25 mg total) by mouth daily. 04/20/19  Yes Harold Hedge, MD  Cholecalciferol (VITAMIN D) 125 MCG (5000 UT) CAPS Take 5,000 Units by mouth daily.   Yes [provider]  fluticasone (FLONASE) 50 MCG/ACT nasal spray Place 2 sprays into both nostrils daily.   Yes [provider]  levothyroxine (SYNTHROID) 150 MCG tablet Take 150 mcg by mouth daily. 02/04/19  Yes [provider]  losartan (COZAAR) 100 MG tablet Take 100 mg by mouth daily.   Yes [provider]  Melatonin 10 MG TABS Take 10 mg by mouth at bedtime.    Yes [provider]  Multiple Vitamins-Minerals (MULTIVITAMIN ADULT PO) Take 1 tablet by mouth daily.   Yes [provider]  primidone (MYSOLINE) 50 MG tablet Take 200 mg by mouth 2 (two) times daily.   Yes [provider]  propranolol (INDERAL) 60 MG tablet Take 60 mg by mouth in the morning.   Yes [provider]  sertraline (ZOLOFT) 50 MG tablet Take 50 mg by mouth daily.  05/21/15  Yes [provider]       Gershon Cull MSNA MSN [place]

## 2022-05-19 NOTE — ED Provider Notes (Signed)
[place] EMERGENCY DEPARTMENT AT [facility] Provider Note   CSN: [id number] Arrival date & time: 05/19/22  0944     History  CC: tremors, weakness  Brandy Hicks is a 89 y.o. female.  HPI   Patient has a history of hypertension, essential tremor, hyperlipidemia, osteoporosis, Guillain-Barr syndrome, chronic kidney disease who presents to the ED with complaints of tremors and weakness.  Patient initially started having symptoms a few days ago.  She has felt that her tremors are worse than usual.  Today she was also feeling more weak and weak when standing or walking.  Patient called EMS and they noted she had a fever this morning to 102.  Patient denies having any trouble with cough.  No sore throat.  No vomiting or diarrhea.  No dysuria.  Home Medications Prior to Admission medications   Medication Sig Start Date End Date Taking? Authorizing Provider  amLODipine (NORVASC) 10 MG tablet Take 10 mg by mouth daily.    [provider]  amoxicillin (AMOXIL) 500 MG tablet Take 500 mg by mouth 3 (three) times daily. 06/13/20   [provider]  aspirin 81 MG tablet Take 81 mg by mouth daily.    [provider]  atorvastatin (LIPITOR) 40 MG tablet Take 40 mg by mouth daily.    [provider]  betamethasone dipropionate 0.05 % cream Apply 1 application topically 2 (two) times daily. 08/09/19   [provider]  chlorthalidone (HYGROTON) 25 MG tablet Take 1 tablet (25 mg total) by mouth daily. 04/20/19   Harold Hedge, MD  Cholecalciferol (VITAMIN D) 125 MCG (5000 UT) CAPS Take 1 capsule by mouth daily.    [provider]  fluconazole (DIFLUCAN) 150 MG tablet Take 150 mg by mouth once. 01/17/20   [provider]  FLUoxetine (PROZAC) 20 MG tablet Take 1 tablet by mouth daily. 05/06/10   [provider]  fluticasone (FLONASE) 50 MCG/ACT nasal spray Place 2 sprays into both nostrils daily.    [provider]  fluticasone (FLONASE) 50 MCG/ACT nasal spray Place into the nose.    [provider]  gabapentin (NEURONTIN) 100 MG capsule Take 100 mg by mouth at bedtime. 10/28/19   [provider]  gabapentin (NEURONTIN) 300 MG capsule Take 2 capsules by mouth 2 (two) times daily. 04/24/20   [provider]  hydrocortisone 2.5 % ointment Apply topically 2 (two) times daily. 05/31/20   [provider]  ketoconazole (NIZORAL) 2 % cream Apply topically 2 (two) times daily. 12/23/19   [provider]  levothyroxine (SYNTHROID) 137 MCG tablet Take 137 mcg by mouth daily. 02/04/19   [provider]  lisinopril (ZESTRIL) 20 MG tablet Take 1 tablet by mouth daily. 05/06/10   [provider]  losartan (COZAAR) 100 MG tablet Take 100 mg by mouth daily.    [provider]  medroxyPROGESTERone (PROVERA) 2.5 MG tablet Take 1 tablet by mouth daily. 05/06/10   [provider]  Melatonin 10 MG TABS Take 10 mg by mouth at bedtime.     [provider]  Multiple Vitamins-Minerals (MULTIVITAMIN ADULT PO) Take 1 tablet by mouth daily.    [provider]  potassium chloride SA (KLOR-CON) 20 MEQ tablet Take by mouth. 01/25/20   [provider]  primidone (MYSOLINE) 50 MG tablet Take by mouth. Take 200 mg every am and pm    [provider]  propranolol (INDERAL) 60 MG tablet Take  60 mg by mouth in the morning.    [provider]  sertraline (ZOLOFT) 50 MG tablet Take 50 mg by mouth daily.  05/21/15   [provider]  triamcinolone (KENALOG) 0.025 % cream SMARTSIG:Sparingly Topical Twice Daily 12/23/19   [provider]  triamcinolone cream (KENALOG) 0.1 % SMARTSIG:Topical Twice a Week PRN 04/24/20   [provider]  vitamin B-12 (CYANOCOBALAMIN) 1000 MCG tablet Take 1,000 mcg by mouth daily.    [provider]      Allergies    Hydroquinone    Review of Systems    Review of Systems  Physical Exam Updated Vital Signs BP (!) 94/43   Pulse 68   Temp 98.6 F (37 C) (Oral)   Resp (!) 29   Ht 1.549 m ('5\' 1"'$ )   Wt 52.2 kg   SpO2 96%   BMI 21.73 kg/m  Physical Exam Vitals and nursing note reviewed.  Constitutional:      Appearance: She is well-developed. She is not diaphoretic.  HENT:     Head: Normocephalic and atraumatic.     Right Ear: External ear normal.     Left Ear: External ear normal.  Eyes:     General: No scleral icterus.       Right eye: No discharge.        Left eye: No discharge.     Conjunctiva/sclera: Conjunctivae normal.  Neck:     Trachea: No tracheal deviation.  Cardiovascular:     Rate and Rhythm: Normal rate and regular rhythm.  Pulmonary:     Effort: Pulmonary effort is normal. No respiratory distress.     Breath sounds: Normal breath sounds. No stridor. No wheezing or rales.  Abdominal:     General: Bowel sounds are normal. There is no distension.     Palpations: Abdomen is soft.     Tenderness: There is no abdominal tenderness. There is no guarding or rebound.  Musculoskeletal:        General: No tenderness or deformity.     Cervical back: Neck supple.  Skin:    General: Skin is warm and dry.     Findings: No rash.  Neurological:     General: No focal deficit present.     Mental Status: She is alert.     Cranial Nerves: No cranial nerve deficit, dysarthria or facial asymmetry.     Sensory: No sensory deficit.     Motor: Tremor present. No abnormal muscle tone or seizure activity.     Coordination: Coordination normal.     Comments: Tremulous voice but no dysarthria or aphasia, able to lift both arms and legs off the bed,  Psychiatric:        Mood and Affect: Mood normal.     ED Results / Procedures / Treatments   Labs (all labs ordered are listed, but only abnormal results are displayed) Labs Reviewed  CBC - Abnormal; Notable for the following components:      Result Value   WBC 19.0 (*)    RBC  3.63 (*)    Hemoglobin 11.8 (*)    HCT 34.6 (*)    All other components within normal limits  BASIC METABOLIC PANEL - Abnormal; Notable for the following components:   Sodium 129 (*)    Potassium 2.9 (*)    Chloride 93 (*)    Glucose, Bld 128 (*)    BUN 36 (*)    Creatinine, Ser 1.58 (*)    Calcium  8.3 (*)    GFR, Estimated 31 (*)    All other components within normal limits  RESP PANEL BY RT-PCR (RSV, FLU A&B, COVID)  [id number]  CULTURE, BLOOD (ROUTINE X 2)  CULTURE, BLOOD (ROUTINE X 2)  LACTIC ACID, PLASMA  URINALYSIS, ROUTINE W REFLEX MICROSCOPIC  LACTIC ACID, PLASMA  LACTIC ACID, PLASMA  MAGNESIUM    EKG EKG Interpretation  Date/Time:  Monday May 19 2022 09:56:48 EST Ventricular Rate:  78 PR Interval:  148 QRS Duration: 87 QT Interval:  355 QTC Calculation: 405 R Axis:   77 Text Interpretation: Sinus rhythm No significant change since last tracing Confirmed by Dorie Rank [phone]) on 05/19/2022 10:05:12 AM  Radiology DG Chest 2 View  Result Date: 05/19/2022 CLINICAL DATA:  Fever EXAM: CHEST - 2 VIEW COMPARISON:  01/24/2020 FINDINGS: The heart size and mediastinal contours are within normal limits. Aortic atherosclerosis. Both lungs are clear. The visualized skeletal structures are unremarkable. IMPRESSION: No active cardiopulmonary disease. Electronically Signed   By: Davina Poke D.O.   On: 05/19/2022 10:19    Procedures Procedures    Medications Ordered in ED Medications  sodium chloride 0.9 % bolus 500 mL (0 mLs Intravenous Stopped 05/19/22 1201)    Followed by  0.9 %  sodium chloride infusion (1,000 mLs Intravenous New Bag/Given 05/19/22 1118)  cefTRIAXone (ROCEPHIN) 1 g in sodium chloride 0.9 % 100 mL IVPB (has no administration in time range)  sodium chloride 0.9 % bolus 500 mL (has no administration in time range)  acetaminophen (TYLENOL) tablet 1,000 mg (1,000 mg Oral Given 05/19/22 1057)  potassium chloride SA (KLOR-CON M) CR tablet 40 mEq (40 mEq Oral Given  05/19/22 1411)    ED Course/ Medical Decision Making/ A&P Clinical Course as of 05/19/22 1603  Mon May 19, 2022  1149 Resp panel by RT-PCR (RSV, Flu A&B, Covid) Anterior Nasal Swab COVID flu RSV negative [JK]  1149 DG Chest 2 View Chest x-ray without acute findings [JK]  1353 CBC(!) White blood cell count elevated at 19.  Electrolyte panel shows elevated BUN/creatinine, decreased potassium, decreased sodium [JK]  1558 Blood pressure decreasing.  Additional fluid boluses ordered [JK]    Clinical Course User Index [JK] Dorie Rank, MD                             Medical Decision Making Problems Addressed: AKI (acute kidney injury) [facility]): acute illness or injury that poses a threat to life or bodily functions Febrile illness: acute illness or injury that poses a threat to life or bodily functions Hypokalemia: acute illness or injury that poses a threat to life or bodily functions Hyponatremia: acute illness or injury that poses a threat to life or bodily functions  Amount and/or Complexity of Data Reviewed Labs: ordered. Decision-making details documented in ED Course. Radiology: ordered. Decision-making details documented in ED Course. Discussion of management or test interpretation with external provider(s): D.w Dr Antonieta Pert  Risk OTC drugs. Prescription drug management. Decision regarding hospitalization.   Patient presented to the ED for evaluation of tremulousness and noted to have a fever of 103 here.  Patient's labs are notable for significant leukocytosis with as well as an AKI.  Lactic acid level not elevated but she does have soft blood pressures.  Patient was treated with IV fluids.  No evidence of pneumonia COVID flu RSV.  Concerned about the possibility of urinary tract infection.  We are still waiting for  sample.  Will continue with IV hydration.  I will start on empiric antibiotics.  Will consult with the hospitalist service for admission and further treatment.            Final Clinical Impression(s) / ED Diagnoses Final diagnoses:  AKI (acute kidney injury) ([place])  Hyponatremia  Hypokalemia  Febrile illness    Rx / DC Orders ED Discharge Orders     None         Dorie Rank, MD 05/19/22 [phone]

## 2022-05-19 NOTE — ED Triage Notes (Signed)
Pt BIBA from [facility] at [facility] with c/o generalized weakness x3days, tremor at baseline but says it is worse today. Slipped from off the side of bed, down onto the floor. No LOC, did not hit her head. Was put back into bed by a friend. Denies any N/V/D. Placed on 2L  per EMS.   T 103  BP 127/46 HR 70 CBG 177 O2 94% RA RR 32

## 2022-05-19 NOTE — H&P (Addendum)
History and Physical    Brandy Hicks [name] [id number] DOB: 28-Jul-1932 DOA: 05/19/2022  PCP: Davina Poke, FNP   Patient coming from:   Chief Complaint  Patient presents with   Weakness      HPI:89yo f w/ PH of HTN,essential tremor, hyperlipidemia, osteoporosis, hypothyroidism, Guillain-Barr syndrome about 5 years ago, CKD3b b/l creat of 1-1.2 presented to the ED with 3 days of Weakness  and shaky, worsening of baseline tremors. She c/o chills but no fever. Patient denies any nausea, vomiting, chest pain, shortness of breath,headache, focal weakness, numbness tingling, speech difficulties. She c/o food not being good at [employer] She was much worse today with difficulty standing walking getting up.  EMS was called noted fever of 102.  Patient denies any shortness of breath cough or respiratory symptoms sore throat nausea vomiting diarrhea.  In the ED, vital signs showed fever 103.1 respiration 24 BP 132/38 saturating 99% on room air, labs showed hyponatremia 129 hypokalemia 2.9 elevated BUN/creatinine 36/1.5 left acid 1.6 with leukocytosis 19 3 respiratory virus COVID/influenza RSV negative blood culture sent UA ordered pending, given a liter bolus, ceftriaxone, BP noted to be down 94/43> at this time half liter bolus ordered, repeat lactic acid ordered> I&O ordered to get [place] sample.  She was given 40kcl po. Admission was requested for further management. Current BP is in 108/42, she c/o feeling chills, no numbness or tingling or weakness of legs or speech difficulties or swallowing difficulties.   Assessment/Plan:  SIRS with leukocytosis for fever tachypnea POA: Concern for sepsis, likely UTI.  Chest x-ray clear.  Lactic acid normal BP stable initially now BP soft given getting half liter IV fluids in the ED.  Follow-up repeat lactic acid and order additional bolus if needed.  Follow-up UA, blood culture urine culture, continue ceftriaxone empirically for possible UTI  Generalized  weakness/debility: Likely in the setting of #1, will request PT OT, check TSH B12 vitamin D.  Check CK level  Hypovolemic hyponatremia continue IV fluid hydration and monitor Hypokalemia will replete with IV and p.o. KCl check mag. AKI on CKD 3B b/l creat 1-1.2, continue IV hydration monitor avoid nephrotoxic medication  HLD: Resume statin. GBS hx-patient does have intact knee reflex no muscle weakness in proximal and distal muscle no sensory deficit.  Will touch base with neurology in case we will need to be watchful or monitor any symptoms. Hypothyroidism: Resume home Synthroid Essential tremor chronic: resume mysoline Body mass index is 21.73 kg/m.   Severity of Illness: The appropriate patient status for this patient is INPATIENT. Inpatient status is judged to be reasonable and necessary in order to provide the required intensity of service to ensure the patient's safety. The patient's presenting symptoms, physical exam findings, and initial radiographic and laboratory data in the context of their chronic comorbidities is felt to place them at high risk for further clinical deterioration. Furthermore, it is not anticipated that the patient will be medically stable for discharge from the hospital within 2 midnights of admission.   * I certify that at the point of admission it is my clinical judgment that the patient will require inpatient hospital care spanning beyond 2 midnights from the point of admission due to high intensity of service, high risk for further deterioration and high frequency of surveillance required.*   DVT prophylaxis: enoxaparin (LOVENOX) injection 30 mg Start: 05/19/22 2200 Code Status:   Code Status: DNR confirmed with with patient and reviewed her previous previous CODE STATUS which is  DNR  Family Communication: Admission, patients condition and plan of care including tests being ordered have been discussed with the patient , called her POA granddaughter and updated-  confirmed code status, she indicate understanding and agree with the plan and Code Status.  Consults called:   Review of Systems: All systems were reviewed and were negative except as mentioned in HPI above. Negative for fever Negative for chest pain Negative for shortness of breath  Past Medical History:  Diagnosis Date   Chronic kidney disease, stage 3 unspecified (HCC)    Chronic kidney disease, unspecified    Constipation, unspecified    Depression    Essential hypertension    Essential tremor    Guillain Barr syndrome ([place])    Hormone replacement therapy    Hyperlipidemia    Hypertension    Hypothyroid    Hypothyroidism, unspecified    Impacted cerumen of both ears    Impacted cerumen of right ear    Osteoporosis    Ovarian cyst    Radiculopathy, thoracic region    Retention of urine, unspecified    Rosacea    Seborrheic dermatitis, unspecified    Somnolence    Thoracic spine pain    TIA (transient ischemic attack)    Tremor    Unspecified mood (affective) disorder ([place])     Past Surgical History:  Procedure Laterality Date   CHOLECYSTECTOMY N/A 01/03/2020   Procedure: LAPAROSCOPIC CHOLECYSTECTOMY;  Surgeon: Erroll Luna, MD;  Location: [facility];  Service: General;  Laterality: N/A;   FACIAL COSMETIC SURGERY     GALLBLADDER SURGERY     Post [place] Gallbladder Removal    HEEL SPUR SURGERY     HEEL SPUR SURGERY     TONSILLECTOMY       reports that she has quit smoking. She has never used smokeless tobacco. She reports that she does not drink alcohol and does not use drugs.  Allergies  Allergen Reactions   Hydroquinone     Pinkness and edema of face and eyelids, severe    Family History  Problem Relation Age of Onset   Stroke Mother    Hypertension Father    CAD Father    Prostate cancer Father    Liver disease Sister    Hypertension Daughter    Thyroid disease Daughter    Breast cancer Daughter    Heart murmur Daughter    Hypertension  Daughter    Multiple sclerosis Daughter    Kidney disease Daughter      Prior to Admission medications   Medication Sig Start Date End Date Taking? Authorizing Provider  acetaminophen (TYLENOL) 500 MG tablet Take 500 mg by mouth every 6 (six) hours as needed for moderate pain.   Yes [provider]  amLODipine (NORVASC) 10 MG tablet Take 10 mg by mouth daily.   Yes [provider]  aspirin 81 MG tablet Take 81 mg by mouth daily.   Yes [provider]  atorvastatin (LIPITOR) 40 MG tablet Take 40 mg by mouth daily.   Yes [provider]  chlorthalidone (HYGROTON) 25 MG tablet Take 1 tablet (25 mg total) by mouth daily. 04/20/19  Yes Harold Hedge, MD  Cholecalciferol (VITAMIN D) 125 MCG (5000 UT) CAPS Take 5,000 Units by mouth daily.   Yes [provider]  fluticasone (FLONASE) 50 MCG/ACT nasal spray Place 2 sprays into both nostrils daily.   Yes [provider]  levothyroxine (SYNTHROID) 150 MCG tablet Take 150 mcg by mouth  daily. 02/04/19  Yes [provider]  losartan (COZAAR) 100 MG tablet Take 100 mg by mouth daily.   Yes [provider]  Melatonin 10 MG TABS Take 10 mg by mouth at bedtime.    Yes [provider]  Multiple Vitamins-Minerals (MULTIVITAMIN ADULT PO) Take 1 tablet by mouth daily.   Yes [provider]  primidone (MYSOLINE) 50 MG tablet Take 200 mg by mouth 2 (two) times daily.   Yes [provider]  propranolol (INDERAL) 60 MG tablet Take 60 mg by mouth in the morning.   Yes [provider]  sertraline (ZOLOFT) 50 MG tablet Take 50 mg by mouth daily.  05/21/15  Yes [provider]    Physical Exam: Vitals:   05/19/22 1400 05/19/22 1500 05/19/22 1508 05/19/22 1715  BP: (!) 107/37 (!) 94/43  100/89  Pulse: 72 68  83  Resp: (!) 27 (!) 29  (!) 33  Temp:   98.6 F (37 C)   TempSrc:   Oral   SpO2: 93% 96%  100%  Weight:      Height:        General exam:  AAOx3, tremulous,elderly, weak appearing. HEENT:Oral mucosa moist, Ear/Nose WNL grossly, dentition normal. Respiratory system: bilaterally CLEAR,no wheezing or crackles,no use of accessory muscle Cardiovascular system: S1 & S2 +, No JVD,. Gastrointestinal system: Abdomen soft, NT,ND, BS+ Nervous System:Alert, awake, moving extremities and grossly nonfocal-intact knee reflex bilaterally equal and strength 4 to 5 / 5 on bilateral lower extremities upper extremities  Extremities: No edema, distal peripheral pulses palpable.  Skin: No rashes,no icterus. MSK: Normal muscle bulk,tone, power   Labs on Admission: I have personally reviewed following labs and imaging studies  CBC: Recent Labs  Lab 05/19/22 1127  WBC 19.0*  HGB 11.8*  HCT 34.6*  MCV 95.3  PLT [id number]   Basic Metabolic Panel: Recent Labs  Lab 05/19/22 1127  NA 129*  K 2.9*  CL 93*  CO2 25  GLUCOSE 128*  BUN 36*  CREATININE 1.58*  CALCIUM 8.3*   Urine analysis:  Radiological Exams on Admission: DG Chest 2 View  Result Date: 05/19/2022 CLINICAL DATA:  Fever EXAM: CHEST - 2 VIEW COMPARISON:  01/24/2020 FINDINGS: The heart size and mediastinal contours are within normal limits. Aortic atherosclerosis. Both lungs are clear. The visualized skeletal structures are unremarkable. IMPRESSION: No active cardiopulmonary disease. Electronically Signed   By: Davina Poke D.O.   On: 05/19/2022 10:19      Antonieta Pert MD [facility]  If 7PM-7AM, please contact night-coverage [email or web address]  05/19/2022, 5:41 PM

## 2022-05-19 NOTE — Progress Notes (Signed)
Patient noted to have a sudden drop in BP. Patient endorses weakness, but otherwise asymptomatic. NP made aware and orders given. Patient given bolus x1 with little effect. 2nd bolus in process. NP being made aware continually of patient's status.

## 2022-05-20 ENCOUNTER — Inpatient Hospital Stay (HOSPITAL_COMMUNITY): Payer: Medicare HMO

## 2022-05-20 DIAGNOSIS — R7881 Bacteremia: Secondary | ICD-10-CM

## 2022-05-20 DIAGNOSIS — A419 Sepsis, unspecified organism: Secondary | ICD-10-CM | POA: Insufficient documentation

## 2022-05-20 DIAGNOSIS — A4151 Sepsis due to Escherichia coli [E. coli]: Secondary | ICD-10-CM | POA: Insufficient documentation

## 2022-05-20 DIAGNOSIS — N179 Acute kidney failure, unspecified: Secondary | ICD-10-CM | POA: Diagnosis not present

## 2022-05-20 DIAGNOSIS — R6521 Severe sepsis with septic shock: Secondary | ICD-10-CM | POA: Diagnosis not present

## 2022-05-20 LAB — BLOOD CULTURE ID PANEL (REFLEXED) - BCID2

## 2022-05-20 LAB — CBC
HCT: 29.4 % — ABNORMAL LOW (ref 36.0–46.0)
Hemoglobin: 9.9 g/dL — ABNORMAL LOW (ref 12.0–15.0)
MCH: 33.4 pg (ref 26.0–34.0)
MCHC: 33.7 g/dL (ref 30.0–36.0)
MCV: 99.3 fL (ref 80.0–100.0)
Platelets: 150 10*3/uL (ref 150–400)
RBC: 2.96 MIL/uL — ABNORMAL LOW (ref 3.87–5.11)
RDW: 13.7 % (ref 11.5–15.5)
WBC: 20.1 10*3/uL — ABNORMAL HIGH (ref 4.0–10.5)
nRBC: 0 % (ref 0.0–0.2)

## 2022-05-20 LAB — ECHOCARDIOGRAM COMPLETE
AR max vel: 1.75 cm2
AV Area VTI: 1.72 cm2
AV Area mean vel: 1.64 cm2
AV Mean grad: 7.5 mmHg
AV Peak grad: 13.2 mmHg
Ao pk vel: 1.82 m/s
Area-P 1/2: 4.39 cm2
Calc EF: 68.7 %
Height: 61 in
MV M vel: 4.2 m/s
MV Peak grad: 70.4 mmHg
S' Lateral: 1.9 cm
Single Plane A2C EF: 69.7 %
Single Plane A4C EF: 64.9 %
Weight: 2127 oz

## 2022-05-20 LAB — BASIC METABOLIC PANEL
Anion gap: 9 (ref 5–15)
BUN: 31 mg/dL — ABNORMAL HIGH (ref 8–23)
CO2: 23 mmol/L (ref 22–32)
Calcium: 7.1 mg/dL — ABNORMAL LOW (ref 8.9–10.3)
Chloride: 102 mmol/L (ref 98–111)
Creatinine, Ser: 1.46 mg/dL — ABNORMAL HIGH (ref 0.44–1.00)
GFR, Estimated: 35 mL/min — ABNORMAL LOW (ref >60)
Glucose, Bld: 131 mg/dL — ABNORMAL HIGH (ref 70–99)
Potassium: 3.8 mmol/L (ref 3.5–5.1)
Sodium: 134 mmol/L — ABNORMAL LOW (ref 135–145)

## 2022-05-20 LAB — COMPREHENSIVE METABOLIC PANEL
ALT: 23 U/L (ref 0–44)
AST: 39 U/L (ref 15–41)
Albumin: 2.6 g/dL — ABNORMAL LOW (ref 3.5–5.0)
Alkaline Phosphatase: 71 U/L (ref 38–126)
Anion gap: 6 (ref 5–15)
BUN: 36 mg/dL — ABNORMAL HIGH (ref 8–23)
CO2: 20 mmol/L — ABNORMAL LOW (ref 22–32)
Calcium: 6.8 mg/dL — ABNORMAL LOW (ref 8.9–10.3)
Chloride: 107 mmol/L (ref 98–111)
Creatinine, Ser: 1.59 mg/dL — ABNORMAL HIGH (ref 0.44–1.00)
GFR, Estimated: 31 mL/min — ABNORMAL LOW (ref >60)
Glucose, Bld: 101 mg/dL — ABNORMAL HIGH (ref 70–99)
Potassium: 3.1 mmol/L — ABNORMAL LOW (ref 3.5–5.1)
Sodium: 133 mmol/L — ABNORMAL LOW (ref 135–145)
Total Bilirubin: 0.6 mg/dL (ref 0.3–1.2)
Total Protein: 5 g/dL — ABNORMAL LOW (ref 6.5–8.1)

## 2022-05-20 LAB — STREP PNEUMONIAE URINARY ANTIGEN: Strep Pneumo Urinary Antigen: POSITIVE — AB

## 2022-05-20 LAB — LACTIC ACID, PLASMA: Lactic Acid, Venous: 1.8 mmol/L (ref 0.5–1.9)

## 2022-05-20 LAB — BLOOD GAS, ARTERIAL
Acid-base deficit: 3.6 mmol/L — ABNORMAL HIGH (ref 0.0–2.0)
Bicarbonate: 21.5 mmol/L (ref 20.0–28.0)
Drawn by: 29503
FIO2: 45 %
O2 Saturation: 99.2 %
Patient temperature: 37
pCO2 arterial: 38 mmHg (ref 32–48)
pH, Arterial: 7.36 (ref 7.35–7.45)
pO2, Arterial: 91 mmHg (ref 83–108)

## 2022-05-20 LAB — MAGNESIUM: Magnesium: 1.8 mg/dL (ref 1.7–2.4)

## 2022-05-20 LAB — OSMOLALITY, URINE: Osmolality, Ur: 336 mOsm/kg (ref 300–900)

## 2022-05-20 LAB — TSH: TSH: 0.189 u[IU]/mL — ABNORMAL LOW (ref 0.350–4.500)

## 2022-05-20 LAB — PHOSPHORUS: Phosphorus: 3.5 mg/dL (ref 2.5–4.6)

## 2022-05-20 LAB — PROTEIN / CREATININE RATIO, URINE
Creatinine, Urine: 135 mg/dL
Protein Creatinine Ratio: 0.9 mg/mg{Cre} — ABNORMAL HIGH (ref 0.00–0.15)
Total Protein, Urine: 122 mg/dL

## 2022-05-20 LAB — VITAMIN B12: Vitamin B-12: 203 pg/mL (ref 180–914)

## 2022-05-20 LAB — MRSA NEXT GEN BY PCR, NASAL: MRSA by PCR Next Gen: DETECTED — AB

## 2022-05-20 LAB — VITAMIN D 25 HYDROXY (VIT D DEFICIENCY, FRACTURES): Vit D, 25-Hydroxy: 106.19 ng/mL — ABNORMAL HIGH (ref 30–100)

## 2022-05-20 LAB — T4, FREE: Free T4: 1.32 ng/dL — ABNORMAL HIGH (ref 0.61–1.12)

## 2022-05-20 LAB — SODIUM, URINE, RANDOM: Sodium, Ur: 127 mmol/L

## 2022-05-20 LAB — CORTISOL: Cortisol, Plasma: 30.1 ug/dL

## 2022-05-20 MED ORDER — POTASSIUM CHLORIDE 10 MEQ/100ML IV SOLN
10.0000 meq | INTRAVENOUS | Status: DC
  Administered 2022-05-20: 10 meq via INTRAVENOUS
  Filled 2022-05-20 (×3): qty 100

## 2022-05-20 MED ORDER — POTASSIUM CHLORIDE 10 MEQ/100ML IV SOLN
10.0000 meq | INTRAVENOUS | Status: AC
  Administered 2022-05-20 (×2): 10 meq via INTRAVENOUS

## 2022-05-20 MED ORDER — LEVOTHYROXINE SODIUM 125 MCG PO TABS
125.0000 ug | ORAL_TABLET | Freq: Every day | ORAL | Status: DC
  Administered 2022-05-21 – 2022-05-24 (×4): 125 ug via ORAL
  Filled 2022-05-20 (×4): qty 1

## 2022-05-20 MED ORDER — NOREPINEPHRINE 4 MG/250ML-% IV SOLN
2.0000 ug/min | INTRAVENOUS | Status: DC
  Administered 2022-05-20: 2 ug/min via INTRAVENOUS
  Filled 2022-05-20: qty 250

## 2022-05-20 MED ORDER — MAGNESIUM SULFATE 2 GM/50ML IV SOLN
2.0000 g | Freq: Once | INTRAVENOUS | Status: AC
  Administered 2022-05-20: 2 g via INTRAVENOUS
  Filled 2022-05-20: qty 50

## 2022-05-20 MED ORDER — AMLODIPINE BESYLATE 10 MG PO TABS
10.0000 mg | ORAL_TABLET | Freq: Every day | ORAL | Status: DC
  Administered 2022-05-20 – 2022-05-24 (×5): 10 mg via ORAL
  Filled 2022-05-20 (×5): qty 1

## 2022-05-20 MED ORDER — SODIUM BICARBONATE 8.4 % IV SOLN
100.0000 meq | Freq: Once | INTRAVENOUS | Status: AC
  Administered 2022-05-20: 100 meq via INTRAVENOUS
  Filled 2022-05-20: qty 100

## 2022-05-20 MED ORDER — DEXMEDETOMIDINE HCL IN NACL 200 MCG/50ML IV SOLN
0.0000 ug/kg/h | INTRAVENOUS | Status: DC
  Administered 2022-05-20: 0.4 ug/kg/h via INTRAVENOUS
  Filled 2022-05-20: qty 50

## 2022-05-20 MED ORDER — MUPIROCIN 2 % EX OINT
1.0000 | TOPICAL_OINTMENT | Freq: Two times a day (BID) | CUTANEOUS | Status: DC
  Administered 2022-05-20 – 2022-05-24 (×9): 1 via NASAL
  Filled 2022-05-20 (×3): qty 22

## 2022-05-20 MED ORDER — SODIUM CHLORIDE 0.9 % IV SOLN
250.0000 mL | INTRAVENOUS | Status: DC
  Administered 2022-05-20: 250 mL via INTRAVENOUS

## 2022-05-20 MED ORDER — FUROSEMIDE 10 MG/ML IJ SOLN
40.0000 mg | Freq: Once | INTRAMUSCULAR | Status: AC
  Administered 2022-05-20: 40 mg via INTRAVENOUS
  Filled 2022-05-20: qty 4

## 2022-05-20 MED ORDER — PROPRANOLOL HCL 20 MG PO TABS: 60.0000 mg | ORAL_TABLET | Freq: Every morning | ORAL | Status: DC

## 2022-05-20 NOTE — Progress Notes (Signed)
PHARMACY - PHYSICIAN COMMUNICATION CRITICAL VALUE ALERT - BLOOD CULTURE IDENTIFICATION (BCID)  Brandy Hicks is an 89 y.o. female who presented to [facility] on 05/19/2022 with a chief complaint of weakness  Assessment:  1/4 Ecoli, no R  Name of physician (or Provider) Contacted: [name]  Current antibiotics: CTX 2gm q24h  Changes to prescribed antibiotics recommended:  Patient is on recommended antibiotics - No changes needed  Results for orders placed or performed during the hospital encounter of 05/19/22  Blood Culture ID Panel (Reflexed) (Collected: 05/19/2022 11:25 AM)  Result Value Ref Range   Enterococcus faecalis NOT DETECTED NOT DETECTED   Enterococcus Faecium NOT DETECTED NOT DETECTED   Listeria monocytogenes NOT DETECTED NOT DETECTED   Staphylococcus species NOT DETECTED NOT DETECTED   Staphylococcus aureus (BCID) NOT DETECTED NOT DETECTED   Staphylococcus epidermidis NOT DETECTED NOT DETECTED   Staphylococcus lugdunensis NOT DETECTED NOT DETECTED   Streptococcus species NOT DETECTED NOT DETECTED   Streptococcus agalactiae NOT DETECTED NOT DETECTED   Streptococcus pneumoniae NOT DETECTED NOT DETECTED   Streptococcus pyogenes NOT DETECTED NOT DETECTED   A.calcoaceticus-baumannii NOT DETECTED NOT DETECTED   Bacteroides fragilis NOT DETECTED NOT DETECTED   Enterobacterales DETECTED (A) NOT DETECTED   Enterobacter cloacae complex NOT DETECTED NOT DETECTED   Escherichia coli DETECTED (A) NOT DETECTED   Klebsiella aerogenes NOT DETECTED NOT DETECTED   Klebsiella oxytoca NOT DETECTED NOT DETECTED   Klebsiella pneumoniae NOT DETECTED NOT DETECTED   Proteus species NOT DETECTED NOT DETECTED   Salmonella species NOT DETECTED NOT DETECTED   Serratia marcescens NOT DETECTED NOT DETECTED   Haemophilus influenzae NOT DETECTED NOT DETECTED   Neisseria meningitidis NOT DETECTED NOT DETECTED   Pseudomonas aeruginosa NOT DETECTED NOT DETECTED   Stenotrophomonas maltophilia  NOT DETECTED NOT DETECTED   Candida albicans NOT DETECTED NOT DETECTED   Candida auris NOT DETECTED NOT DETECTED   Candida glabrata NOT DETECTED NOT DETECTED   Candida krusei NOT DETECTED NOT DETECTED   Candida parapsilosis NOT DETECTED NOT DETECTED   Candida tropicalis NOT DETECTED NOT DETECTED   Cryptococcus neoformans/gattii NOT DETECTED NOT DETECTED   CTX-M ESBL NOT DETECTED NOT DETECTED   Carbapenem resistance IMP NOT DETECTED NOT DETECTED   Carbapenem resistance KPC NOT DETECTED NOT DETECTED   Carbapenem resistance NDM NOT DETECTED NOT DETECTED   Carbapenem resist OXA 48 LIKE NOT DETECTED NOT DETECTED   Carbapenem resistance VIM NOT DETECTED NOT DETECTED    Dolly Rias RPh 05/20/2022, 3:27 AM

## 2022-05-20 NOTE — Progress Notes (Signed)
After pushing bicarb, patient suddenly became short of breath, labored breathing noted. Elink button hit. No crackles or wheezes. O2 increased to 4L with some relief. Eyes noted to also be puffy. Patient UOP since 2000 was 120 mL, bladder scan done, showed 173 mL. Elink aware.

## 2022-05-20 NOTE — Progress Notes (Signed)
  Echocardiogram 2D Echocardiogram has been performed.  Brandy Hicks 05/20/2022, 2:29 PM

## 2022-05-20 NOTE — Consult Note (Signed)
NAME:  Brandy Hicks, MRN:  [id number], DOB:  1932/03/21, LOS: 1 ADMISSION DATE:  05/19/2022, CONSULTATION DATE:  05/20/22 REFERRING MD:  TRH, CHIEF COMPLAINT:  hypotension   History of Present Illness:  89 yo female with pmh hypothyroidism, essential tremor, hyperlipidemia, osteoporosis, GBS ~5 years ago CKD3b. Presented to ed 3/4 with 2-3 days of weakness, worsening tremors, chills, no other associated symptoms. She denies n/v/d, no resp symptoms just progressive weakness and decreased enteral intake. She denies fevers but when EMS arrived temp was noted to be 102. Hypotensive in ED but fluid responsive. Lactate negative. Urine appears infected. Pt denies any dysuria, no increased frequency but occasionally feeling as though she needs to urinate but unable to.   CCM was consulted after admission for worsening BP req vasopressors (low dose). Pt seen in ICU appears ill. On 38mg of levo. She is on abx, checking urine studies, echo and cont with IVF.   Pertinent  Medical History  Hypothyroidism Essential tremor Hyperlipidemia ckd3b  Significant Hospital Events: Including procedures, antibiotic start and stop dates in addition to other pertinent events   3/4 admitted for sepsis 2/2 uti, hyponatremia 3/5 ccm consulted  Interim History / Subjective:    Objective   Blood pressure (!) 112/43, pulse 72, temperature 98.1 F (36.7 C), temperature source Oral, resp. rate (!) 21, height '5\' 1"'$  (1.549 m), weight 60.3 kg, SpO2 96 %.        Intake/Output Summary (Last 24 hours) at 05/20/2022 0531 Last data filed at 05/20/2022 0455 Gross per 24 hour  Intake 4969.8 ml  Output --  Net 4969.8 ml   Filed Weights   05/19/22 1121 05/20/22 0500  Weight: 52.2 kg 60.3 kg    Examination: General: appears ill, laying supine in bed with baseline tremors noted HENT: ncat, eomi, perrla, mm dry but pink Lungs: ctab Cardiovascular: sinus Abdomen: soft not tender, non distended Extremities: no  c/c/e Neuro: tremor, otherwise no focal deficits GU: deferred  Resolved Hospital Problem list     Assessment & Plan:  Shock:  -undetermined etiology at this time.  -presumably sepsis 2/2 uti + ecoli bacteremia with wbc 20.1 -urine cx should be pending -empiric abx with ceftriaxone -cont with resuscitation -titrate vasopressors to map >65 -echo in am.  -thankfully lactate is normal Sepsis 2/2 uti Ecoli bacteremia:  -ctx  Hyponatremia:  -would hold ssri with potential for hyponatremia -cont fluids, consider albumin with noted value 2.6 -urine lytes -check tsh/t4 Hypokalemia:  -replace -give mag with recent level 1.7  [place]:  -stable indices -follow uop -check urine lytes and protein/cr   Hypothyroidism:  -cont levothyroxine -check t4 Hyperglycemia:  -check A1c -ssi  Best Practice (right click and "Reselect all SmartList Selections" daily)   Diet/type: Regular consistency (see orders) DVT prophylaxis: LMWH GI prophylaxis: N/A Lines: N/A Foley:  Yes, and it is still needed Code Status:  DNR Last date of multidisciplinary goals of care discussion [per primary]  Labs   CBC: Recent Labs  Lab 05/19/22 1127 05/20/22 0019  WBC 19.0* 20.1*  HGB 11.8* 9.9*  HCT 34.6* 29.4*  MCV 95.3 99.3  PLT 202 [id number]   Basic Metabolic Panel: Recent Labs  Lab 05/19/22 1127 05/19/22 2233 05/20/22 0019  NA 129* 134* 133*  K 2.9* 3.3* 3.1*  CL 93* 107 107  CO2 25 21* 20*  GLUCOSE 128* 103* 101*  BUN 36* 34* 36*  CREATININE 1.58* 1.67* 1.59*  CALCIUM 8.3* 6.8* 6.8*  MG  --  1.7  --    GFR: Estimated Creatinine Clearance: 20.8 mL/min (A) (by C-G formula based on SCr of 1.59 mg/dL (H)). Recent Labs  Lab 05/19/22 1127 05/19/22 1625 05/20/22 0019  WBC 19.0*  --  20.1*  LATICACIDVEN 1.6 1.6 1.8    Liver Function Tests: Recent Labs  Lab 05/19/22 2233 05/20/22 0019  AST 38 39  ALT 22 23  ALKPHOS 109 71  BILITOT 0.5 0.6  PROT 5.0* 5.0*  ALBUMIN 2.6* 2.6*    No results for input(s): "LIPASE", "AMYLASE" in the last 168 hours. No results for input(s): "AMMONIA" in the last 168 hours.  ABG No results found for: "PHART", "PCO2ART", "PO2ART", "HCO3", "TCO2", "ACIDBASEDEF", "O2SAT"   Coagulation Profile: No results for input(s): "INR", "PROTIME" in the last 168 hours.  Cardiac Enzymes: Recent Labs  Lab 05/19/22 2233  CKTOTAL 118    HbA1C: No results found for: "HGBA1C"  CBG: No results for input(s): "GLUCAP" in the last 168 hours.  Review of Systems:   As per HPI  Past Medical History:  She,  has a past medical history of Chronic kidney disease, stage 3 unspecified ([place]), Chronic kidney disease, unspecified, Constipation, unspecified, Depression, Essential hypertension, Essential tremor, Guillain Barr syndrome ([place]), Hormone replacement therapy, Hyperlipidemia, Hypertension, Hypothyroid, Hypothyroidism, unspecified, Impacted cerumen of both ears, Impacted cerumen of right ear, Osteoporosis, Ovarian cyst, Radiculopathy, thoracic region, Retention of urine, unspecified, Rosacea, Seborrheic dermatitis, unspecified, Somnolence, Thoracic spine pain, TIA (transient ischemic attack), Tremor, and Unspecified mood (affective) disorder ([place]).   Surgical History:   Past Surgical History:  Procedure Laterality Date   CHOLECYSTECTOMY N/A 01/03/2020   Procedure: LAPAROSCOPIC CHOLECYSTECTOMY;  Surgeon: Erroll Luna, MD;  Location: [facility];  Service: General;  Laterality: N/A;   FACIAL COSMETIC SURGERY     GALLBLADDER SURGERY     Post [place] Gallbladder Removal    HEEL SPUR SURGERY     HEEL SPUR SURGERY     TONSILLECTOMY       Social History:   reports that she has quit smoking. She has never used smokeless tobacco. She reports that she does not drink alcohol and does not use drugs.   Family History:  Her family history includes Breast cancer in her daughter; CAD in her father; Heart murmur in her daughter; Hypertension in her daughter,  daughter, and father; Kidney disease in her daughter; Liver disease in her sister; Multiple sclerosis in her daughter; Prostate cancer in her father; Stroke in her mother; Thyroid disease in her daughter.   Allergies Allergies  Allergen Reactions   Hydroquinone     Pinkness and edema of face and eyelids, severe     Home Medications  Prior to Admission medications   Medication Sig Start Date End Date Taking? Authorizing Provider  acetaminophen (TYLENOL) 500 MG tablet Take 500 mg by mouth every 6 (six) hours as needed for moderate pain.   Yes [provider]  amLODipine (NORVASC) 10 MG tablet Take 10 mg by mouth daily.   Yes [provider]  aspirin 81 MG tablet Take 81 mg by mouth daily.   Yes [provider]  atorvastatin (LIPITOR) 40 MG tablet Take 40 mg by mouth daily.   Yes [provider]  chlorthalidone (HYGROTON) 25 MG tablet Take 1 tablet (25 mg total) by mouth daily. 04/20/19  Yes Harold Hedge, MD  Cholecalciferol (VITAMIN D) 125 MCG (5000 UT) CAPS Take 5,000 Units by mouth daily.   Yes [provider]  fluticasone (FLONASE) 50 MCG/ACT  nasal spray Place 2 sprays into both nostrils daily.   Yes [provider]  levothyroxine (SYNTHROID) 150 MCG tablet Take 150 mcg by mouth daily. 02/04/19  Yes [provider]  losartan (COZAAR) 100 MG tablet Take 100 mg by mouth daily.   Yes [provider]  Melatonin 10 MG TABS Take 10 mg by mouth at bedtime.    Yes [provider]  Multiple Vitamins-Minerals (MULTIVITAMIN ADULT PO) Take 1 tablet by mouth daily.   Yes [provider]  primidone (MYSOLINE) 50 MG tablet Take 200 mg by mouth 2 (two) times daily.   Yes [provider]  propranolol (INDERAL) 60 MG tablet Take 60 mg by mouth in the morning.   Yes [provider]  sertraline (ZOLOFT) 50 MG tablet Take 50 mg by mouth daily.  05/21/15  Yes [provider]     Critical care  time: 78mn excluding procedures

## 2022-05-20 NOTE — Progress Notes (Addendum)
PCCM Interval Note   Off NE since ~0600, with SBP's increasing up to 180's (taken on RLE) since moved to LUE> with improved readings  Remains on 2L [place]> desaturated to mid 80's on RA  AM CXR w/ subtle lucencies in mediastinum suspicious for early pneumomediastinum, and new/ increased interstitial markings bilaterally    General:  Older female sitting upright in bed in no distress, SO at bedside HEENT: MM pink/ dry, pupils 3/reactive, some audible wheeze with upper airway end expiratory wheeze noted Neuro: alert, oriented, MAE 5/5, denies any altered sensation/ heaviness in LE CV: rr, NSR, no murmur PULM:  tachypneic- varies 20-40s, shallow, clear anteriorly, diminished in bases with posterior bibasilar rales GI: soft, bs+, NT Extremities: warm/dry, no pitting LE edema  Skin: no rashes  Bedside POCUS with dilated IVC, little respiratory variation Labs/ imaging reviewed    Hypoxic respiratory failure concerning for CAP vs aspiration pneumonitis vs pneumonia vs fluid overload vs atelectasis R/o pneumomediastinum  Possible VCD- given exp upper airway stridor Plan:  - chest CT to r/o pneumomediastinum given concern on CXR, also will help further evaluate lungs.  No obvious risk factors that would cause this> denies any dysphagia or trouble passing food, just doesn't taste good, no cough.  Reports very claustrophobic with CT.  Avoiding long acting sedation, will do precedex to obtain CT.  - significant other reported vomiting episode yesterday> cont ctx for now.  May need to add doxy vs vanc given +MRSA PCR  pending CT findings - now net + 6.5L > lasix '40mg'$  x 1 - check ABG, urine strep pna - given tachypnea/ intermittent WOB, switch to [place] to help prevent respiratory fatigue, otherwise currently only needing 2L for sat goal > 92% - pulm hygiene- IS - DNR/ DNI   Septic +/- hypovolemic shock, resolved  E. Coli bacteremia/ UTI - remains off vasopressors.  Goal MAP > 65 - pending echo   - stop IVF, lasix '40mg'$  x 1 - cont abx, follow UC   Addendum 1450 ABG reassuring> 7.36/ 38/ 91/ 21.5 Likes HHNC> less SOB> likely can transition back to [place] after diuresis  Off precedex S/p lasix '40mg'$ > 1L UOP thus far CT chest >  - No pneumomediastinum - Bilateral small pleural effusions identified with adjacent lung opacities. There is increasing tree-in-bud, reticulonodular changes in both lungs. In addition there is developing bronchial wall thickening along both lower lobes with some interstitial thickening.   - Underlying centrilobular emphysematous changes.  - Extensive vascular calcifications identified including along the great vessels with potential stenosis.   - Similar mildly enlarged and borderline multiple mediastinal lymph nodes going back to 2021. - Diffuse anasarca and skin thickening. - Aortic Atherosclerosis and Emphysema      Kennieth Rad, MSN, AG-ACNP-BC [facility] 05/20/2022, 10:44 AM  See Amion for pager If no response to pager, please call PCCM consult pager After 7:00 pm call El[place]

## 2022-05-20 NOTE — TOC Initial Note (Signed)
Transition of Care [facility]) - Initial/Assessment Note    Patient Details  Name: Brandy Hicks MRN: [id number] Date of Birth: [birth year]  Transition [facility]) CM/SW Contact:    Roseanne Kaufman, RN Phone Number: 05/20/2022, 5:30 PM  Clinical Narrative:        Per chart review patient from [name]), patient currently in ICU.   TOC will continue to follow for needs.              Barriers to Discharge: Continued Medical Work up   Patient Goals and CMS Choice     Choice offered to / list presented to : Patient [place] ownership interest in [facility].provided to:: Patient    Expected Discharge Plan and Services In-house Referral: NA Discharge Planning Services: CM Consult Post Acute Care Choice:  ([name]) Living arrangements for the past 2 months: [place]                                      Prior Living Arrangements/Services Living arrangements for the past 2 months: [place] Lives with:: Facility Resident   Do you feel safe going back to the place where you live?: Yes               Activities of Daily Living Home Assistive Devices/Equipment: None ADL Screening (condition at time of admission) Patient's cognitive ability adequate to safely complete daily activities?: Yes Is the patient deaf or have difficulty hearing?: No Does the patient have difficulty seeing, even when wearing glasses/contacts?: No Does the patient have difficulty concentrating, remembering, or making decisions?: No Patient able to express need for assistance with ADLs?: Yes Does the patient have difficulty dressing or bathing?: No Independently performs ADLs?: Yes (appropriate for developmental age) Does the patient have difficulty walking or climbing stairs?: Yes Weakness of Legs: Both Weakness of Arms/Hands: Both  Permission Sought/Granted Permission sought to share information with :  Case Manager                Emotional Assessment Appearance:: Appears stated age         Psych Involvement: No (comment)  Admission diagnosis:  Hypokalemia [E87.6] Hyponatremia [E87.1] SIRS (systemic inflammatory response syndrome) ([place]) [R65.10] AKI (acute kidney injury) ([place]) [N17.9] Febrile illness [R50.9] Patient Active Problem List   Diagnosis Date Noted   Septic shock ([place]) 05/20/2022   Sepsis due to Escherichia coli (E. coli) ([place]) 05/20/2022   SIRS (systemic inflammatory response syndrome) ([place]) 05/19/2022   Cholecystitis, acute 01/03/2020   Acute cholecystitis 01/02/2020   Hypomagnesemia 01/02/2020   Hyperkalemia 01/02/2020   Mixed hyperlipidemia 01/02/2020   Hypothyroidism 01/02/2020   Chest pain 01/02/2020   Lung infiltrate on CT 01/02/2020   Fecal impaction ([place]) 04/19/2019   Acute urinary retention 04/19/2019   Leukocytosis 04/19/2019   Urinary retention 04/19/2019   Hyponatremia 04/19/2019   Hypokalemia 04/19/2019   Voice tremor 10/13/2017   GBS (Guillain-Barre syndrome) ([place]) 07/31/2015   History of Guillain-Barre syndrome 05/02/2015   Anxiety state 05/02/2015   Panic attack 05/02/2015   Essential tremor 05/02/2015   Altered mental status    UTI (urinary tract infection) 04/22/2015   Acute kidney injury ([place]) 04/22/2015   Essential hypertension, benign 11/28/2013   Depression 04/09/2011   Insomnia 04/09/2011   PCP:  Davina Poke, FNP Pharmacy:   [facility],  [place] [address] Phone: [phone] Fax: [phone]  [id number] [id number]: [phone]: [phone]    Social Determinants of Health (SDOH) Social History: [place] No Food Insecurity (05/19/2022)  Housing: Low Risk  (05/19/2022)  Transportation Needs:  No Transportation Needs (05/19/2022)  Utilities: Not At Risk (05/19/2022)  Depression (PHQ2-9): Low Risk  (01/31/2020)  Tobacco Use: Medium Risk (05/19/2022)   SDOH Interventions:     Readmission Risk Interventions     No data to display

## 2022-05-20 NOTE — Progress Notes (Signed)
PROGRESS NOTE Brandy Hicks  [id number] DOB: [birth year] DOA: 05/19/2022 PCP: Davina Poke, FNP  Brief Narrative/Hospital Course: 89yo f w/ PH of HTN,essential tremor, hyperlipidemia, osteoporosis, hypothyroidism, Guillain-Barr syndrome about 5 years ago, CKD3b b/l creat of 1-1.2 presented to the ED with 3 days of Weakness  and shaky, worsening of baseline tremors. She c/o chills but no fever. Patient denies any nausea, vomiting, chest pain, shortness of breath,headache, focal weakness, numbness tingling, speech difficulties. She c/o food not being good at [employer] She was much worse today with difficulty standing walking getting up.  EMS was called noted fever of 102.  Patient denies any shortness of breath cough or respiratory symptoms sore throat nausea vomiting diarrhea.  In the ED, vital signs showed fever 103.1 respiration 24 BP 132/38 saturating 99% on room air, labs showed hyponatremia 129 hypokalemia 2.9 elevated BUN/creatinine 36/1.5 left acid 1.6 with leukocytosis 19 3 respiratory virus COVID/influenza RSV negative blood culture sent UA ordered pending, given a liter bolus, ceftriaxone, BP noted to be down 94/43> at this time half liter bolus ordered, repeat lactic acid ordered> I&O ordered to get [place] sample.  She was given 40kcl po. Admission was requested for further management. On admission BP in 108/42.    Subjective: Seen and examined this morning patient feels much better less tremulous, BP has been running on the higher side Levophed has been discontinued earlier this morning. Labs this morning showed improving sodium potassium 3.1 creatinine downtrending 1.5   Assessment and Plan: Principal Problem:   Sepsis due to Escherichia coli (E. coli) (HCC) Active Problems:   Hyponatremia   SIRS (systemic inflammatory response syndrome) (HCC)   Septic shock (HCC)  Septic shock due to E. coli bacteremia POA UTI: BP and lactic acid stable admission> placed on empiric  ceftriaxone pending ua> was also given additional IV fluids however overnight hypotensive needing additional IV fluid boluses and subsequently placed on peripheral Levophed, weaned off this morning.  WBC remains elevated, BC ID with E. coli follow-up blood/urine culture, BP on higher side.  Pulmonary critical team following.  Serum cortisol normal 30.1.  Renal ultrasound no acute finding.  In primary pulmonary team IVC appears full so holding off further IV fluids. Giving lasix x1. Recent Labs  Lab 05/19/22 1127 05/19/22 1625 05/20/22 0019  WBC 19.0*  --  20.1*  LATICACIDVEN 1.6 1.6 1.8    Acute pulmonary insufficiency/hypoxia: Increased interstitial markings bilaterally suspicious for interstitial edema and a/or atypical pneumonia,  subtle lucencies within the mediastinum-?Early pneumomediastinum, CT chest has been ordered defer to pulmonary-overactivity following.  Continue supplemental oxygen.  Check procalcitonin if it is elevated add azithromycin or Doxy  Underlying emphysematous lung changes- cont pulm support  Hypovolemic hyponatremia: Resolving with IV fluids Hypokalemia: Likely from poor oral intake replacing aggressively.  Monitor Acute kidney injury on CKD 3B:b/l creat 1-1.2, creatinine slowly improving on IV fluid ,monitor avoid nephrotoxic medication Recent Labs    01/20/22 0558 05/19/22 1127 05/19/22 2233 05/20/22 0019  BUN 23 36* 34* 36*  CREATININE 1.15* 1.58* 1.67* 1.59*   Hypertension normal on amlodipine 10, chlorthalidone, losartan> antihypertensive due to sepsis since blood pressure trending up may need to resume gradually.  Will resume propranolol for now which is also for her ET , monitor HLD: cont statin. GBS hx-patient does have intact knee reflex no muscle weakness in proximal and distal muscle in leg or arms.  Has generalized weakness.  If she has any change in reflex/or weakness or any concerning  symptoms will need to involve neurology Hypothyroidism: TSH low  0.18 .  Checkied free T4> will cut down Synthroid dose-will need to recheck as OP and decrease TSH dose if still low Essential tremor chronic: cont mysoline, resume propranolol  Goals of care confirmed DNR status  DVT prophylaxis: enoxaparin (LOVENOX) injection 30 mg Start: 05/19/22 2200 Code Status:   Code Status: DNR Family Communication: plan of care discussed with patient at bedside. Patient status is: Inpatient because of [place] Level of care: ICU   Dispo: The patient is from: ALF            Anticipated disposition: TBD Objective: Vitals last 24 hrs: Vitals:   05/20/22 0645 05/20/22 0800 05/20/22 0900 05/20/22 1000  BP: (!) 140/57 (!) 162/47 (!) 166/49 (!) 215/59  Pulse: 84 84 85 88  Resp: (!) 32 (!) 32 (!) 33 (!) 37  Temp:  98.2 F (36.8 C)    TempSrc:  Oral    SpO2: 98% 97% 94% 95%  Weight:      Height:       Weight change:   Physical Examination: General exam: alert awake, pleasant, elderly, chronically sick looking  HEENT:Oral mucosa moist, Ear/Nose WNL grossly Respiratory system: bilaterally basal crackles/diminished BS, no use of accessory muscle Cardiovascular system: S1 & S2 +, No JVD. Gastrointestinal system: Abdomen soft,NT,ND, BS+ Nervous System:Alert, awake, moving extremities. Extremities: LE edema neg,distal peripheral pulses palpable.  Skin: No rashes,no icterus. MSK: Normal muscle bulk,tone, power  Medications reviewed:  Scheduled Meds:  aspirin  81 mg Oral Daily   atorvastatin  40 mg Oral Daily   Chlorhexidine Gluconate Cloth  6 each Topical Daily   cholecalciferol  5,000 Units Oral Daily   enoxaparin (LOVENOX) injection  30 mg Subcutaneous Q24H   fluticasone  2 spray Each Nare Daily   levothyroxine  150 mcg Oral Daily   melatonin  10 mg Oral QHS   multivitamin with minerals  1 tablet Oral Daily   mupirocin ointment  1 Application Nasal BID   primidone  200 mg Oral BID   sertraline  50 mg Oral Daily   Continuous Infusions:  sodium chloride  Stopped (05/20/22 0948)   0.9 % NaCl with KCl 20 mEq / L 125 mL/hr at 05/20/22 1005   cefTRIAXone (ROCEPHIN)  IV     norepinephrine (LEVOPHED) Adult infusion Stopped (05/20/22 0631)    Diet Order             Diet regular Room service appropriate? Yes; Fluid consistency: Thin  Diet effective now                   Intake/Output Summary (Last 24 hours) at 05/20/2022 1029 Last data filed at 05/20/2022 1005 Gross per 24 hour  Intake 6470.16 ml  Output 120 ml  Net 6350.16 ml   Net IO Since Admission: 6,350.16 mL [05/20/22 1029]  Wt Readings from Last 3 Encounters:  05/20/22 60.3 kg  01/20/22 65.7 kg  01/31/20 65.7 kg     Unresulted Labs (From admission, onward)     Start     Ordered   05/20/22 1024  Phosphorus  Add-on,   AD       Question:  Specimen collection method  Answer:  Lab=Lab collect   05/20/22 1023   05/20/22 0603  Osmolality, urine  Once,   R        05/20/22 0602   05/20/22 0603  Sodium, urine, random  Once,   R  05/20/22 0602   05/20/22 0603  Hemoglobin A1c  Once,   R       Question:  Specimen collection method  Answer:  Lab=Lab collect   05/20/22 0602   05/20/22 0603  T4, free  Once,   R       Question:  Specimen collection method  Answer:  Lab=Lab collect   05/20/22 0602   05/20/22 0603  Protein / creatinine ratio, urine  Once,   R        05/20/22 0602   05/19/22 1640  Urine Culture  Once,   AD        05/19/22 1640          Data Reviewed: I have personally reviewed following labs and imaging studies CBC: Recent Labs  Lab 05/19/22 1127 05/20/22 0019  WBC 19.0* 20.1*  HGB 11.8* 9.9*  HCT 34.6* 29.4*  MCV 95.3 99.3  PLT 202 [id number]   Basic Metabolic Panel: Recent Labs  Lab 05/19/22 1127 05/19/22 2233 05/20/22 0019 05/20/22 0739  NA 129* 134* 133*  --   K 2.9* 3.3* 3.1*  --   CL 93* 107 107  --   CO2 25 21* 20*  --   GLUCOSE 128* 103* 101*  --   BUN 36* 34* 36*  --   CREATININE 1.58* 1.67* 1.59*  --   CALCIUM 8.3* 6.8* 6.8*  --   MG   --  1.7  --  1.8   GFR: Estimated Creatinine Clearance: 20.8 mL/min (A) (by C-G formula based on SCr of 1.59 mg/dL (H)). Liver Function Tests: Recent Labs  Lab 05/19/22 2233 05/20/22 0019  AST 38 39  ALT 22 23  ALKPHOS 109 71  BILITOT 0.5 0.6  PROT 5.0* 5.0*  ALBUMIN 2.6* 2.6*    Recent Labs    05/20/22 0019  TSH 0.189*   Sepsis Labs: Recent Labs  Lab 05/19/22 1127 05/19/22 1625 05/20/22 0019  LATICACIDVEN 1.6 1.6 1.8    Recent Results (from the past 240 hour(s))  Resp panel by RT-PCR (RSV, Flu A&B, Covid) Anterior Nasal Swab     Status: None   Collection Time: 05/19/22 10:07 AM   Specimen: Anterior Nasal Swab  Result Value Ref Range Status   SARS Coronavirus 2 by RT PCR NEGATIVE NEGATIVE Final    Comment: (NOTE) SARS-CoV-2 target nucleic acids are NOT DETECTED.  The SARS-CoV-2 RNA is generally detectable in upper respiratory specimens during the acute phase of infection. The lowest concentration of SARS-CoV-2 viral copies this assay can detect is 138 copies/mL. A negative result does not preclude SARS-Cov-2 infection and should not be used as the sole basis for treatment or other patient management decisions. A negative result may occur with  improper specimen collection/handling, submission of specimen other than nasopharyngeal swab, presence of viral mutation(s) within the areas targeted by this assay, and inadequate number of viral copies(<138 copies/mL). A negative result must be combined with clinical observations, patient history, and epidemiological information. The expected result is Negative.  Fact Sheet for Patients:  EntrepreneurPulse.com.au  Fact Sheet for Healthcare Providers:  IncredibleEmployment.be  This test is no t yet approved or cleared by the [place] FDA and  has been authorized for detection and/or diagnosis of SARS-CoV-2 by FDA under an Emergency Use Authorization (EUA). This EUA will remain   in effect (meaning this test can be used) for the duration of the COVID-19 declaration under Section 564(b)(1) of the Act, 21 U.S.C.section 360bbb-3(b)(1), unless the authorization is  terminated  or revoked sooner.       Influenza A by PCR NEGATIVE NEGATIVE Final   Influenza B by PCR NEGATIVE NEGATIVE Final    Comment: (NOTE) The Xpert Xpress SARS-CoV-2/FLU/RSV plus assay is intended as an aid in the diagnosis of influenza from Nasopharyngeal swab specimens and should not be used as a sole basis for treatment. Nasal washings and aspirates are unacceptable for Xpert Xpress SARS-CoV-2/FLU/RSV testing.  Fact Sheet for Patients: EntrepreneurPulse.com.au  Fact Sheet for Healthcare Providers: IncredibleEmployment.be  This test is not yet approved or cleared by the [place] FDA and has been authorized for detection and/or diagnosis of SARS-CoV-2 by FDA under an Emergency Use Authorization (EUA). This EUA will remain in effect (meaning this test can be used) for the duration of the COVID-19 declaration under Section 564(b)(1) of the Act, 21 U.S.C. section 360bbb-3(b)(1), unless the authorization is terminated or revoked.     Resp Syncytial Virus by PCR NEGATIVE NEGATIVE Final    Comment: (NOTE) Fact Sheet for Patients: EntrepreneurPulse.com.au  Fact Sheet for Healthcare Providers: IncredibleEmployment.be  This test is not yet approved or cleared by the [place] FDA and has been authorized for detection and/or diagnosis of SARS-CoV-2 by FDA under an Emergency Use Authorization (EUA). This EUA will remain in effect (meaning this test can be used) for the duration of the COVID-19 declaration under Section 564(b)(1) of the Act, 21 U.S.C. section 360bbb-3(b)(1), unless the authorization is terminated or revoked.  Performed at [facility], [place] [address] [place], [place]  [address]   Blood culture (routine x 2)     Status: None (Preliminary result)   Collection Time: 05/19/22 11:17 AM   Specimen: Left Antecubital; Blood  Result Value Ref Range Status   Specimen Description   Final    LEFT ANTECUBITAL BLOOD Performed at [facility], [place] [address], [place], [place] [address]    Special Requests   Final    BOTTLES DRAWN AEROBIC AND ANAEROBIC Blood Culture results may not be optimal due to an excessive volume of blood received in culture bottles Performed at [place] [address] [place], [place] [address]    Culture  Setup Time   Final    GRAM NEGATIVE RODS IN BOTH AEROBIC AND ANAEROBIC BOTTLES CRITICAL VALUE NOTED.  VALUE IS CONSISTENT WITH PREVIOUSLY REPORTED AND CALLED VALUE. Performed at [facility], [place] [address], [place], [place] [address]    Culture GRAM NEGATIVE RODS  Final   Report Status PENDING  Incomplete  Blood culture (routine x 2)     Status: None (Preliminary result)   Collection Time: 05/19/22 11:25 AM   Specimen: Right Antecubital; Blood  Result Value Ref Range Status   Specimen Description   Final    RIGHT ANTECUBITAL BLOOD Performed at [facility], [place] [address], [place], [place] [address]    Special Requests   Final    BOTTLES DRAWN AEROBIC AND ANAEROBIC Blood Culture results may not be optimal due to an excessive volume of blood received in culture bottles Performed at [place] [address], [place], [place] [address]    Culture  Setup Time   Final    GRAM NEGATIVE RODS IN BOTH AEROBIC AND ANAEROBIC BOTTLES CRITICAL RESULT CALLED TO, READ BACK BY AND VERIFIED WITH: E JACKSON,PHARMD'@0320'$  05/20/22 [place] Performed at [facility], [place] [address] [place],  [address]    Culture GRAM NEGATIVE RODS  Final   Report Status PENDING  Incomplete  Blood Culture ID Panel (Reflexed)     Status: Abnormal   Collection Time: 05/19/22 11:25 AM  Result Value  Ref Range Status   Enterococcus faecalis NOT DETECTED NOT DETECTED Final   Enterococcus Faecium NOT DETECTED NOT DETECTED Final   Listeria monocytogenes NOT DETECTED NOT DETECTED Final   Staphylococcus species NOT DETECTED NOT DETECTED Final   Staphylococcus aureus (BCID) NOT DETECTED NOT DETECTED Final   Staphylococcus epidermidis NOT DETECTED NOT DETECTED Final   Staphylococcus lugdunensis NOT DETECTED NOT DETECTED Final   Streptococcus species NOT DETECTED NOT DETECTED Final   Streptococcus agalactiae NOT DETECTED NOT DETECTED Final   Streptococcus pneumoniae NOT DETECTED NOT DETECTED Final   Streptococcus pyogenes NOT DETECTED NOT DETECTED Final   A.calcoaceticus-baumannii NOT DETECTED NOT DETECTED Final   Bacteroides fragilis NOT DETECTED NOT DETECTED Final   Enterobacterales DETECTED (A) NOT DETECTED Final    Comment: Enterobacterales represent a large order of gram negative bacteria, not a single organism. CRITICAL RESULT CALLED TO, READ BACK BY AND VERIFIED WITH: E JACKSON,PHARMD'@0320'$  05/20/22 [place]    Enterobacter cloacae complex NOT DETECTED NOT DETECTED Final   Escherichia coli DETECTED (A) NOT DETECTED Final    Comment: CRITICAL RESULT CALLED TO, READ BACK BY AND VERIFIED WITH: E JACKSON,PHARMD'@0323'$  05/20/22 [place]    Klebsiella aerogenes NOT DETECTED NOT DETECTED Final   Klebsiella oxytoca NOT DETECTED NOT DETECTED Final   Klebsiella pneumoniae NOT DETECTED NOT DETECTED Final   Proteus species NOT DETECTED NOT DETECTED Final   Salmonella species NOT DETECTED NOT DETECTED Final   Serratia marcescens NOT DETECTED NOT DETECTED Final   Haemophilus influenzae NOT DETECTED NOT DETECTED Final   Neisseria meningitidis NOT DETECTED NOT DETECTED Final   Pseudomonas aeruginosa NOT DETECTED NOT DETECTED Final   Stenotrophomonas maltophilia NOT DETECTED NOT DETECTED Final   Candida albicans NOT DETECTED NOT DETECTED Final   Candida auris NOT DETECTED NOT DETECTED Final   Candida glabrata  NOT DETECTED NOT DETECTED Final   Candida krusei NOT DETECTED NOT DETECTED Final   Candida parapsilosis NOT DETECTED NOT DETECTED Final   Candida tropicalis NOT DETECTED NOT DETECTED Final   Cryptococcus neoformans/gattii NOT DETECTED NOT DETECTED Final   CTX-M ESBL NOT DETECTED NOT DETECTED Final   Carbapenem resistance IMP NOT DETECTED NOT DETECTED Final   Carbapenem resistance KPC NOT DETECTED NOT DETECTED Final   Carbapenem resistance NDM NOT DETECTED NOT DETECTED Final   Carbapenem resist OXA 48 LIKE NOT DETECTED NOT DETECTED Final   Carbapenem resistance VIM NOT DETECTED NOT DETECTED Final    Comment: Performed at [facility], [address], [place], [place] [address]  MRSA Next Gen by PCR, Nasal     Status: Abnormal   Collection Time: 05/19/22  8:13 PM   Specimen: Nasal Mucosa; Nasal Swab  Result Value Ref Range Status   MRSA by PCR Next Gen DETECTED (A) NOT DETECTED Final    Comment: (NOTE) The GeneXpert MRSA Assay (FDA approved for NASAL specimens only), is one component of a comprehensive MRSA colonization surveillance program. It is not intended to diagnose MRSA infection nor to guide or monitor treatment for MRSA infections. Test performance is not FDA approved in patients less than 63 years old. Performed at [facility], [place] [address], [place], [place] [address]     Antimicrobials: Anti-infectives (From admission, onward)    Start     Dose/Rate Route Frequency Ordered Stop   05/20/22  1600  cefTRIAXone (ROCEPHIN) 2 g in sodium chloride 0.9 % 100 mL IVPB        2 g 200 mL/hr over 30 Minutes Intravenous Every 24 hours 05/19/22 1841     05/19/22 1630  cefTRIAXone (ROCEPHIN) 2 g in sodium chloride 0.9 % 100 mL IVPB        2 g 200 mL/hr over 30 Minutes Intravenous  Once 05/19/22 1617 05/19/22 1753   05/19/22 1530  cefTRIAXone (ROCEPHIN) 1 g in sodium chloride 0.9 % 100 mL IVPB  Status:  Discontinued        1 g 200 mL/hr over 30 Minutes  Intravenous  Once 05/19/22 1521 05/19/22 1617      Culture/Microbiology    Component Value Date/Time   SDES  05/19/2022 1125    RIGHT ANTECUBITAL BLOOD Performed at [place] [address] [place], [place] [address]    SPECREQUEST  05/19/2022 1125    BOTTLES DRAWN AEROBIC AND ANAEROBIC Blood Culture results may not be optimal due to an excessive volume of blood received in culture bottles Performed at [facility], [place] [address], [place],  [address]    Ruthine Dose NEGATIVE RODS 05/19/2022 1125   REPTSTATUS PENDING 05/19/2022 1125  Radiology Studies: US RENAL  Result Date: 05/20/2022 CLINICAL DATA:  [id number] AKI (acute kidney injury) ([place]) [id number] EXAM: RENAL / URINARY TRACT ULTRASOUND COMPLETE COMPARISON:  None Available. FINDINGS: The right kidney measured 11.5 cm and the left kidney measured 10.0 cm. The kidneys demonstrate increased echogenicity consistent with chronic medical renal disease. Right kidney midpole cyst measures 1.9 cm. This does not need to be evaluated further. No shadowing stones are seen. No hydronephrosis identified. The urinary bladder appeared unremarkable. Bladder volumes were not measured. IMPRESSION: 1. Echogenic kidneys consistent with chronic medical renal disease. 2. Right kidney cyst. Electronically Signed   By: Sammie Bench M.D.   On: 05/20/2022 08:16   DG CHEST PORT 1 VIEW  Result Date: 05/20/2022 CLINICAL DATA:  Hypoxia EXAM: PORTABLE CHEST 1 VIEW COMPARISON:  Chest x-rays dated 05/19/2022 and 01/24/2020. FINDINGS: New subtle lucencies within the mediastinum, suspicious for early pneumomediastinum. Increased interstitial markings bilaterally. No confluent opacity is seen to suggest a consolidating pneumonia. No pleural effusion or pneumothorax is seen. IMPRESSION: 1. New subtle lucencies within the mediastinum, suspicious for early pneumomediastinum. Recommend chest CT further characterization. 2. Increased interstitial  markings bilaterally, suspicious for interstitial edema and/or atypical pneumonia superimposed on chronic interstitial lung disease. These results will be called to the ordering clinician or representative by the Radiologist Assistant, and communication documented in the PACS or [employer]. Electronically Signed   By: Franki Cabot M.D.   On: 05/20/2022 08:12   DG Chest 2 View  Result Date: 05/19/2022 CLINICAL DATA:  Fever EXAM: CHEST - 2 VIEW COMPARISON:  01/24/2020 FINDINGS: The heart size and mediastinal contours are within normal limits. Aortic atherosclerosis. Both lungs are clear. The visualized skeletal structures are unremarkable. IMPRESSION: No active cardiopulmonary disease. Electronically Signed   By: Davina Poke D.O.   On: 05/19/2022 10:19     LOS: 1 day   Antonieta Pert, MD [facility]  05/20/2022, 10:29 AM

## 2022-05-20 NOTE — Progress Notes (Signed)
An USGPIV (ultrasound guided PIV) has been placed for short-term vasopressor infusion. A correctly placed ivWatch must be used when administering Vasopressors. Should this treatment be needed beyond 72 hours, central line access should be obtained.  It will be the responsibility of the bedside nurse to follow best practice to prevent extravasations.   

## 2022-05-20 NOTE — Progress Notes (Addendum)
eLink Physician-Brief Progress Note Patient Name: Brandy Hicks [name] DOB: 1932/08/25 MRN: [id number]   Date of Service  05/20/2022  HPI/Events of Note  89 year old female with a history of DM Barr syndrome, metabolic syndrome, etc. hypertension, and osteoporosis with a history of CKD who initially presents with fevers, tachycardic found to be hyponatremic, hypokalemic, and an acute kidney injury.  She responded to 2 L of fluid bolus.  Critical care was consulted for management of hypotension.  Cultures have been obtained, is on empiric antibiotics.  eICU Interventions  2 PIV's already in place, order placed for peripheral norepinephrine.  Holding antihypertensives.  May be fluid responsive, cannot try an additional 500 cc bolus.  Mentating well.   0645 - mild intermittent hypoxia, low uop - obtain cxr, continue to monitor for now  Intervention Category Evaluation Type: New Patient Evaluation  [name] 05/20/2022, 2:28 AM

## 2022-05-21 ENCOUNTER — Inpatient Hospital Stay (HOSPITAL_COMMUNITY): Payer: Medicare HMO

## 2022-05-21 DIAGNOSIS — E871 Hypo-osmolality and hyponatremia: Secondary | ICD-10-CM | POA: Diagnosis not present

## 2022-05-21 DIAGNOSIS — A491 Streptococcal infection, unspecified site: Secondary | ICD-10-CM

## 2022-05-21 DIAGNOSIS — A419 Sepsis, unspecified organism: Secondary | ICD-10-CM

## 2022-05-21 DIAGNOSIS — R6521 Severe sepsis with septic shock: Secondary | ICD-10-CM

## 2022-05-21 DIAGNOSIS — N179 Acute kidney failure, unspecified: Secondary | ICD-10-CM

## 2022-05-21 DIAGNOSIS — A4151 Sepsis due to Escherichia coli [E. coli]: Secondary | ICD-10-CM | POA: Diagnosis not present

## 2022-05-21 DIAGNOSIS — E440 Moderate protein-calorie malnutrition: Secondary | ICD-10-CM | POA: Insufficient documentation

## 2022-05-21 LAB — URINE CULTURE: Culture: >=100000 — AB

## 2022-05-21 LAB — CBC
HCT: 31.9 % — ABNORMAL LOW (ref 36.0–46.0)
Hemoglobin: 10.5 g/dL — ABNORMAL LOW (ref 12.0–15.0)
MCH: 32.4 pg (ref 26.0–34.0)
MCHC: 32.9 g/dL (ref 30.0–36.0)
MCV: 98.5 fL (ref 80.0–100.0)
Platelets: 156 10*3/uL (ref 150–400)
RBC: 3.24 MIL/uL — ABNORMAL LOW (ref 3.87–5.11)
RDW: 14.1 % (ref 11.5–15.5)
WBC: 19.5 10*3/uL — ABNORMAL HIGH (ref 4.0–10.5)
nRBC: 0 % (ref 0.0–0.2)

## 2022-05-21 LAB — HEPATIC FUNCTION PANEL
ALT: 26 U/L (ref 0–44)
AST: 52 U/L — ABNORMAL HIGH (ref 15–41)
Albumin: 2.5 g/dL — ABNORMAL LOW (ref 3.5–5.0)
Alkaline Phosphatase: 69 U/L (ref 38–126)
Bilirubin, Direct: 0.1 mg/dL (ref 0.0–0.2)
Indirect Bilirubin: 0.4 mg/dL (ref 0.3–0.9)
Total Bilirubin: 0.5 mg/dL (ref 0.3–1.2)
Total Protein: 5.6 g/dL — ABNORMAL LOW (ref 6.5–8.1)

## 2022-05-21 LAB — BASIC METABOLIC PANEL
Anion gap: 7 (ref 5–15)
BUN: 31 mg/dL — ABNORMAL HIGH (ref 8–23)
CO2: 22 mmol/L (ref 22–32)
Calcium: 7 mg/dL — ABNORMAL LOW (ref 8.9–10.3)
Chloride: 104 mmol/L (ref 98–111)
Creatinine, Ser: 1.34 mg/dL — ABNORMAL HIGH (ref 0.44–1.00)
GFR, Estimated: 38 mL/min — ABNORMAL LOW (ref >60)
Glucose, Bld: 121 mg/dL — ABNORMAL HIGH (ref 70–99)
Potassium: 3.7 mmol/L (ref 3.5–5.1)
Sodium: 133 mmol/L — ABNORMAL LOW (ref 135–145)

## 2022-05-21 LAB — MAGNESIUM: Magnesium: 2 mg/dL (ref 1.7–2.4)

## 2022-05-21 LAB — HEMOGLOBIN A1C
Hgb A1c MFr Bld: 6.2 % — ABNORMAL HIGH (ref 4.8–5.6)
Mean Plasma Glucose: 131 mg/dL

## 2022-05-21 LAB — PHOSPHORUS: Phosphorus: 1.9 mg/dL — ABNORMAL LOW (ref 2.5–4.6)

## 2022-05-21 LAB — T4, FREE: Free T4: 1.12 ng/dL (ref 0.61–1.12)

## 2022-05-21 MED ORDER — K PHOS MONO-SOD PHOS DI & MONO 155-852-130 MG PO TABS
500.0000 mg | ORAL_TABLET | Freq: Two times a day (BID) | ORAL | Status: AC
  Administered 2022-05-21 – 2022-05-22 (×2): 500 mg via ORAL
  Filled 2022-05-21 (×2): qty 2

## 2022-05-21 MED ORDER — PHENAZOPYRIDINE HCL 100 MG PO TABS: 100.0000 mg | ORAL_TABLET | Freq: Three times a day (TID) | ORAL | Status: DC

## 2022-05-21 MED ORDER — PROPRANOLOL HCL 20 MG PO TABS
60.0000 mg | ORAL_TABLET | Freq: Every day | ORAL | Status: DC
  Administered 2022-05-23 – 2022-05-24 (×2): 60 mg via ORAL
  Filled 2022-05-21 (×3): qty 3

## 2022-05-21 MED ORDER — POTASSIUM CHLORIDE CRYS ER 20 MEQ PO TBCR
40.0000 meq | EXTENDED_RELEASE_TABLET | Freq: Two times a day (BID) | ORAL | Status: DC
  Administered 2022-05-21: 40 meq via ORAL
  Filled 2022-05-21: qty 2

## 2022-05-21 MED ORDER — URELLE 81 MG PO TABS
1.0000 | ORAL_TABLET | Freq: Three times a day (TID) | ORAL | Status: DC
  Administered 2022-05-21: 81 mg via ORAL
  Filled 2022-05-21: qty 1

## 2022-05-21 MED ORDER — GUAIFENESIN ER 600 MG PO TB12
600.0000 mg | ORAL_TABLET | Freq: Two times a day (BID) | ORAL | Status: DC
  Administered 2022-05-21 – 2022-05-24 (×7): 600 mg via ORAL
  Filled 2022-05-21 (×7): qty 1

## 2022-05-21 MED ORDER — WITCH HAZEL-GLYCERIN EX PADS
MEDICATED_PAD | CUTANEOUS | Status: DC | PRN
  Administered 2022-05-21: 1 via TOPICAL
  Filled 2022-05-21: qty 100

## 2022-05-21 MED ORDER — ALBUTEROL SULFATE (2.5 MG/3ML) 0.083% IN NEBU: 2.5000 mg | INHALATION_SOLUTION | Freq: Four times a day (QID) | RESPIRATORY_TRACT | Status: DC | PRN

## 2022-05-21 MED ORDER — FUROSEMIDE 10 MG/ML IJ SOLN
20.0000 mg | Freq: Once | INTRAMUSCULAR | Status: AC
  Administered 2022-05-21: 20 mg via INTRAVENOUS
  Filled 2022-05-21: qty 2

## 2022-05-21 MED ORDER — ENSURE ENLIVE PO LIQD
237.0000 mL | Freq: Three times a day (TID) | ORAL | Status: DC
  Administered 2022-05-21 – 2022-05-24 (×9): 237 mL via ORAL

## 2022-05-21 NOTE — Progress Notes (Signed)
Flutter given to pt per MD order.

## 2022-05-21 NOTE — Progress Notes (Signed)
NAME:  Brandy Hicks, MRN:  [id number], DOB:  [birth year], LOS: 2 ADMISSION DATE:  05/19/2022, CONSULTATION DATE:  05/20/22 REFERRING MD:  TRH, CHIEF COMPLAINT:  hypotension   History of Present Illness:  89 yo female with pmh hypothyroidism, essential tremor, hyperlipidemia, osteoporosis, GBS ~5 years ago CKD3b. Presented to ed 3/4 with 2-3 days of weakness, worsening tremors, chills, no other associated symptoms. She denies n/v/d, no resp symptoms just progressive weakness and decreased enteral intake. She denies fevers but when EMS arrived temp was noted to be 102. Hypotensive in ED but fluid responsive. Lactate negative. Urine appears infected. Pt denies any dysuria, no increased frequency but occasionally feeling as though she needs to urinate but unable to.   CCM was consulted after admission for worsening BP req vasopressors (low dose). Pt seen in ICU appears ill. On 74mg of levo. She is on abx, checking urine studies, echo and cont with IVF.   Pertinent  Medical History  Hypothyroidism Essential tremor Hyperlipidemia ckd3b  Significant Hospital Events: Including procedures, antibiotic start and stop dates in addition to other pertinent events   3/4 admitted for sepsis 2/2 uti, e. Coli bacteremia hyponatremia,  3/5 ccm consulted, briefly on pressors, CXR ?pneumomediastinum, neg on chest CT, diuresed  Interim History / Subjective:  Feels much better, denies pain, still mild SOB  Objective   Blood pressure (!) 142/37, pulse 80, temperature 98.5 F (36.9 C), temperature source Oral, resp. rate (!) 25, height '5\' 1"'$  (1.549 m), weight 60.3 kg, SpO2 96 %.    FiO2 (%):  [30 %-40 %] 35 %   Intake/Output Summary (Last 24 hours) at 05/21/2022 0756 Last data filed at 05/21/2022 0719 Gross per 24 hour  Intake 1094.78 ml  Output 3250 ml  Net -2155.22 ml   Filed Weights   05/19/22 1121 05/20/22 0500  Weight: 52.2 kg 60.3 kg   Examination: General:  Frail, pleasant elderly female  lying in bed in NAD HEENT: MM pink/moist, pupils 4/reactive, anicteric, no upper airway wheeze, baseline jaw tremor Neuro: Awake, alert, oriented x3, MAE CV: rr, NSR PULM:  non labored, mild tachypnea, exp wheeze on left, clear on right, diminished in bases, on HHFNC 35%/ 35L GI: soft, bs+, NT, purwick Extremities: warm/dry, trace ankle edema  Skin: no rashes   Tmax 100.4 UOP 3.2L/ 24hrs  -2.2L Net +3.2L  BM x 2 yest Labs reviewed> Na 133, K 3.7, BUN/ sCr 31/ sCr 1.46> 31/ 1.34 Urine strep PNA > positive   Resolved Hospital Problem list     Assessment & Plan:   Septic shock 2/2 E. Coli bacteremia and E. coli UTI, resolved  - off pressors early am 3/5 - cont norvasc - abx per primary team - echo reassuring> EF 60-65, no WMA, normal diastolic, normal RV  Acute hypoxic respiratory failure Small bilateral pleural effusions Strep PNA Possible VCD vs volume overload with intermittent upper airway wheeze  - can transition off HHNC> was preventive to prevent respiratory fatigue yesterday, much better after diuresis.  - cont to wean supplement O2 for sat goal > 92% - prn BD, add guaifenesin, aggressive pulm hygiene> IS/ mobilize/ PT - lasix '20mg'$  w/ KCL replete today  - cont ceftriaxone as above   Hyponatremia:  - would hold ssri with potential for hyponatremia - trend on BMET  Hypokalemia:  - replete again today, Mag pending  AKI on CKD 3b - diuresed 3/5 with good UOP and better sCr today - cont to trend renal indices,  avoid nephrotoxic agents, ensure adequate renal perfusion - strict I/O/ urinary output  Hypothyroidism:  - levothyroxine - TSH 0.189, FT4 1.12  Protein calorie malnutrition Generalized weakness/ debility  - RD consult 3/5 per family request given reported poor PO intake and weight loss  - PT   Best Practice (right click and "Reselect all SmartList Selections" daily)   Diet/type: Regular consistency (see orders) DVT prophylaxis: LMWH GI prophylaxis:  N/A Lines: N/A Foley:  N/A Code Status:  DNR Last date of multidisciplinary goals of care discussion [per primary]  Nothing further to add.  PCCM will sign off.  Please call us back if we can be of any further assistance.   Labs   CBC: Recent Labs  Lab 05/19/22 1127 05/20/22 0019 05/21/22 0306  WBC 19.0* 20.1* 19.5*  HGB 11.8* 9.9* 10.5*  HCT 34.6* 29.4* 31.9*  MCV 95.3 99.3 98.5  PLT 202 150 [id number]    Basic Metabolic Panel: Recent Labs  Lab 05/19/22 1127 05/19/22 2233 05/20/22 0019 05/20/22 0739 05/20/22 1711 05/21/22 0306  NA 129* 134* 133*  --  134* 133*  K 2.9* 3.3* 3.1*  --  3.8 3.7  CL 93* 107 107  --  102 104  CO2 25 21* 20*  --  23 22  GLUCOSE 128* 103* 101*  --  131* 121*  BUN 36* 34* 36*  --  31* 31*  CREATININE 1.58* 1.67* 1.59*  --  1.46* 1.34*  CALCIUM 8.3* 6.8* 6.8*  --  7.1* 7.0*  MG  --  1.7  --  1.8  --   --   PHOS  --   --   --  3.5  --   --    GFR: Estimated Creatinine Clearance: 24.7 mL/min (A) (by C-G formula based on SCr of 1.34 mg/dL (H)). Recent Labs  Lab 05/19/22 1127 05/19/22 1625 05/20/22 0019 05/21/22 0306  WBC 19.0*  --  20.1* 19.5*  LATICACIDVEN 1.6 1.6 1.8  --     Liver Function Tests: Recent Labs  Lab 05/19/22 2233 05/20/22 0019  AST 38 39  ALT 22 23  ALKPHOS 109 71  BILITOT 0.5 0.6  PROT 5.0* 5.0*  ALBUMIN 2.6* 2.6*   No results for input(s): "LIPASE", "AMYLASE" in the last 168 hours. No results for input(s): "AMMONIA" in the last 168 hours.  ABG    Component Value Date/Time   PHART 7.36 05/20/2022 1250   PCO2ART 38 05/20/2022 1250   PO2ART 91 05/20/2022 1250   HCO3 21.5 05/20/2022 1250   ACIDBASEDEF 3.6 (H) 05/20/2022 1250   O2SAT 99.2 05/20/2022 1250     Coagulation Profile: No results for input(s): "INR", "PROTIME" in the last 168 hours.  Cardiac Enzymes: Recent Labs  Lab 05/19/22 2233  CKTOTAL 118    HbA1C: Hgb A1c MFr Bld  Date/Time Value Ref Range Status  05/20/2022 07:39 AM 6.2 (H) 4.8 -  5.6 % Final    Comment:    (NOTE)         Prediabetes: 5.7 - 6.4         Diabetes: >6.4         Glycemic control for adults with diabetes: <7.0     CBG: No results for input(s): "GLUCAP" in the last 168 hours.   Critical care time: n/a     Kennieth Rad, MSN, AG-ACNP-BC [facility] 05/21/2022, 7:56 AM  See Amion for pager If no response to pager, please call PCCM consult pager After 7:00  pm call Elink

## 2022-05-21 NOTE — Progress Notes (Signed)
Initial Nutrition Assessment  DOCUMENTATION CODES:   Non-severe (moderate) malnutrition in context of chronic illness  INTERVENTION:  - Regular diet.  - Ensure Plus High Protein po TID, each supplement provides 350 kcal and 20 grams of protein. - Daily multivitamin to support micronutrient needs.  - Discontinue vitamin D supplementation as vitamin D level above normal limits. Discussed with MD. - Monitor weight trends.   NUTRITION DIAGNOSIS:   Moderate Malnutrition related to chronic illness as evidenced by mild fat depletion, mild muscle depletion, percent weight loss (11% in 4 months).  GOAL:   Patient will meet greater than or equal to 90% of their needs  MONITOR:   PO intake, Supplement acceptance, Weight trends  REASON FOR ASSESSMENT:   Consult Assessment of nutrition requirement/status  ASSESSMENT:   89 yo female with pmh hypothyroidism, HLD, osteoporosis, GBS ~5 years ago, CKD3b who presented with 2-3 days of weakness, worsening tremors, chills, and progressive weakness and decreased enteral intake. Admitted for sepsis.  Patient endorses a UBW of around 140# and steady weight loss over the past 6 months to 1 year.  Per EMR, only weight within the past year is from November, when patient was weighed at 145#. Patient initially weighed at 115# in the ED but patient reports this is a stated weight. RN weighed patient via bed scale during visit which showed 129#.  From 145# to 129# is a 16# or 11% weight loss in 4 months if accurate, significant for the time frame.  Patient admits she has not been eating well for a year or more. Used to eat 2 meals a day and still tries to have 2 meals but they are often very small. Usually only a piece of toast or cereal for breakfast and a few bites of her dinner, which varies. Usually has some kind of chocolate dessert daily. Was not previously drinking nutrition supplements until the past 2-3 weeks when family encouraged her to start  drinking them. Likes chocolate.   Encouraged patient to always order something at each meal to support meeting nutrient needs. She would like assistance with ordering meals, which was added.  She is agreeable to receive chocolate Ensure during admission.  Of note, patient's vitamin D level above normal limits. Discussed discontinuing vitamin D supplementation with MD. MD in agreement.    Medications reviewed and include: 5000 units vitamin D, MVI  Labs reviewed:  Na 133 Creatinine 1.34 Phosphorus 1.9   Latest Reference Range & Units 05/20/22 00:19  Vitamin D, 25-Hydroxy 30 - 100 ng/mL 106.19 (H)  (H): Data is abnormally high   NUTRITION - FOCUSED PHYSICAL EXAM:  Flowsheet Row Most Recent Value  Orbital Region Moderate depletion  Upper Arm Region Mild depletion  Thoracic and Lumbar Region Mild depletion  Buccal Region Mild depletion  Temple Region Mild depletion  Clavicle Bone Region Mild depletion  Clavicle and Acromion Bone Region Moderate depletion  Scapular Bone Region Unable to assess  Dorsal Hand Mild depletion  Patellar Region Mild depletion  Anterior Thigh Region Mild depletion  Posterior Calf Region Moderate depletion  Edema (RD Assessment) Mild  Hair Reviewed  Eyes Reviewed  Mouth Reviewed  Skin Reviewed  Nails Reviewed       Diet Order:   Diet Order             Diet regular Room service appropriate? Yes; Fluid consistency: Thin  Diet effective now  EDUCATION NEEDS:  Education needs have been addressed  Skin:  Skin Assessment: Reviewed RN Assessment  Last BM:  3/5  Height:  Ht Readings from Last 1 Encounters:  05/19/22 '5\' 1"'$  (1.549 m)   Weight:  Wt Readings from Last 1 Encounters:  05/21/22 58.4 kg    BMI:  Body mass index is 24.33 kg/m.  Estimated Nutritional Needs:  Kcal:  1650-1750 kcals Protein:  70-80 grams Fluid:  >/= 1.6L    Samson Frederic RD, LDN For contact information, refer to [facility].

## 2022-05-21 NOTE — Progress Notes (Signed)
RT placed pt on HF [place] at 8LPM. Pt doing well at this time.

## 2022-05-21 NOTE — Progress Notes (Signed)
PROGRESS NOTE    Brandy Hicks  [id number] DOB: [birth year] DOA: 05/19/2022 PCP: Davina Poke, FNP   Brief Narrative:  89yo f w/ PH of HTN,essential tremor, hyperlipidemia, osteoporosis, hypothyroidism, Guillain-Barr syndrome about 5 years ago, CKD3b b/l creat of 1-1.2 presented to the ED with 3 days of Weakness  and shaky, worsening of baseline tremors. She c/o chills but no fever. Patient denies any nausea, vomiting, chest pain, shortness of breath,headache, focal weakness, numbness tingling, speech difficulties. She c/o food not being good at [employer] She was much worse today with difficulty standing walking getting up.  EMS was called noted fever of 102.  Patient denies any shortness of breath cough or respiratory symptoms sore throat nausea vomiting diarrhea.  In the ED, vital signs showed fever 103.1 respiration 24 BP 132/38 saturating 99% on room air, labs showed hyponatremia 129 hypokalemia 2.9 elevated BUN/creatinine 36/1.5 left acid 1.6 with leukocytosis 19 3 respiratory virus COVID/influenza RSV negative blood culture sent UA ordered pending, given a liter bolus, ceftriaxone, BP noted to be down 94/43> at this time half liter bolus ordered, repeat lactic acid ordered> I&O ordered to get [place] sample.  She was given 40kcl po. Admission was requested for further management. On admission BP in 108/42.    Assessment and Plan: No notes have been filed under this hospital service. Service: Hospitalist  Septic Shock due to E. coli bacteremia and UTI with concomitant Strep Pneumo PNA -BP and lactic acid stable but she had to be temporarily placed on pressors and removed yesterday -placed on empiric ceftriaxone and will continue given that urinalysis is pansensitive -was also given additional IV fluids however overnight hypotensive needing additional IV fluid boluses and subsequently placed on peripheral Levophed, weaned off this yesterday AM.   -WBC remains elevated, BC ID with E.  coli follow-up blood/urine culture,  -BP on higher side.  Pulmonary critical team was following.  Serum cortisol normal 30.1.   -Renal ultrasound no acute finding.  In primary pulmonary team IVC appears full so holding off further IV fluids. Received IV Lasix x1 yesterday and repeated today -WBC and LA trend:  Recent Labs  Lab 05/19/22 1127 05/19/22 1625 05/20/22 0019 05/21/22 0306  WBC 19.0*  --  20.1* 19.5*  LATICACIDVEN 1.6   < > 1.8  --    < > = values in this interval not displayed.  -Strep pneumo urine antigen is positive -Chest x-ray today showed "Trace bilateral pleural effusions and bibasilar atelectasis. "  Acute Respiratory Failure with Hypoxia -Increased interstitial markings bilaterally suspicious for interstitial edema and a/or atypical pneumonia,  subtle lucencies within the mediastinum-?Early pneumomediastinum -CT chest has been ordered defer to pulmonary-; CT Scan of the Chest done and showed "No pneumomediastinum identified by CT. Bilateral small pleural effusions identified with adjacent lung opacities. There is increasing tree-in-bud, reticulonodular changes in both lungs. In addition there is developing bronchial wall thickening along both lower lobes with some interstitial thickening. Please correlate for an acute process. Short-term follow up in 3 months may be of some benefit. Underlying centrilobular emphysematous changes. Extensive vascular calcifications identified including along the great vessels with potential stenosis. If needed a follow up CT angiogram can be performed as clinically directed. Similar mildly enlarged and borderline multiple mediastinal lymph nodes going back to 2021. Diffuse anasarca and skin thickening. Aortic Atherosclerosis (ICD10-I70.0) and Emphysema"  -SpO2: 100 % O2 Flow Rate (L/min): 8 L/min FiO2 (%): 30 % -Continue supplemental oxygen.  Check procalcitonin if it  is elevated will consider adding azithromycin or Doxy -Continuous pulse  oximetry maintain O2 saturations greater than 90% -Continue supplemental oxygen via nasal cannula wean O2 as tolerated -She will need an amatory home O2 screen and a repeat chest x-ray prior to discharge   Underlying emphysematous lung changes- cont pulm support   Hypertension  -Normally on amlodipine 10, Chlorthalidone, Losartan at Home -Held antihypertensives due to sepsis but since blood pressure trending up may need to resume gradually.   -Will resume propranolol for now which is also for her ET  -Continue to Monitor BP per protocol   HLD -C/w Atorvastatin 40 mg po Daily   GBS hx -Patient does have intact knee reflex no muscle weakness in proximal and distal muscle in leg or arms.   -Has generalized weakness.  If she has any change in reflex/or weakness or any concerning symptoms will need to involve neurology -Will need PT/OT to evaluate and Treat  Hypothyroidism: TSH low 0.18 .  Checkied free T4 and was 1.1 -Will cut down Synthroid dose-will need to recheck as OP and decrease TSH dose if still low in 4-6 weeks -C/w Levothyroxine 125 mcg po Daily   Essential tremor chronic -Continue Primadone 200 mg po BID and will resume her propranolol   Strep Pneumo Urinary Ag -Suspected PNA -DG CXR as above -Abx as above   Acute urinary retention -Continue bladder scanning and INO cath -If continues to retain will need a Foley catheter  Leukocytosis -WBC Trend: Recent Labs  Lab 05/19/22 1127 05/20/22 0019 05/21/22 0306  WBC 19.0* 20.1* 19.5*  -Continue to Monitor and Trend for S/Sx of Infection and C/w Abx as above -Repeat CBC in the AM  Hyponatremia -Na+ Trend: Recent Labs  Lab 05/19/22 1127 05/19/22 2233 05/20/22 0019 05/20/22 1711 05/21/22 0306  NA 129* 134* 133* 134* 133*  -Continue to Monitor and Trend -Repeat CMP in the AM   Hypokalemia -Patient's K+ Level Trend: Recent Labs  Lab 05/19/22 1127 05/19/22 2233 05/20/22 0019 05/20/22 1711 05/21/22 0306   K 2.9* 3.3* 3.1* 3.8 3.7  -Replete with po Kcl 40 mEQ x1 -Continue to Monitor and Replete as Necessary -Repeat CMP in the AM   Hypophosphatemia -Phos Level Trend: Recent Labs  Lab 05/20/22 0739 05/21/22 0300  PHOS 3.5 1.9*  -Replete with po K Phos 500 mg BID x2 -Continue to Monitor and Replete as Necessary  -Repeat Phos Level in the AM   Normocytic Anemia -Hgb/Hct Trend: Recent Labs  Lab 05/19/22 1127 05/20/22 0019 05/21/22 0306  HGB 11.8* 9.9* 10.5*  HCT 34.6* 29.4* 31.9*  MCV 95.3 99.3 98.5  -Check Anemia Panel in the AM -Continue to Monitor for S/Sx of bleeding; no overt bleeding noted -Repeat CBC in the AM   AKI on CKD Stage 3b -Baseline Creatinine ranging from 1-1.2 -BUN/Cr Trend: Recent Labs  Lab 05/19/22 1127 05/19/22 2233 05/20/22 0019 05/20/22 1711 05/21/22 0306  BUN 36* 34* 36* 31* 31*  CREATININE 1.58* 1.67* 1.59* 1.46* 1.34*  -Avoid Nephrotoxic Medications, Contrast Dyes, Hypotension and Dehydration to Ensure Adequate Renal Perfusion and will need to Renally Adjust Meds -Continue to Monitor and Trend Renal Function carefully and repeat CMP in the AM   Hyperglycemia -Blood sugars ranging from 121-131 on daily BMP/CMP -Continue to to monitor and trend and place on sensitive NovoLog sliding scale insulin if necessary  Hypoalbuminemia -Patient's Albumin Trend: Recent Labs  Lab 05/19/22 2233 05/20/22 0019 05/21/22 0300  ALBUMIN 2.6* 2.6* 2.5*  -Continue to Monitor  and Trend and repeat CMP in the AM  Goals of care  -Confirmed DNR status  DVT prophylaxis: enoxaparin (LOVENOX) injection 30 mg Start: 05/19/22 2200    Code Status: DNR Family Communication: No family currently at bedside but discussed with the patient's granddaughter over the telephone  Disposition Plan:  Level of care: ICU Status is: Inpatient Remains inpatient appropriate because: Needs further clinical improvement and will need an amatory home O2 screen prior to discharge    Consultants:  PCCM/Pulmonary  Procedures:  As delineated as above  Antimicrobials:  Anti-infectives (From admission, onward)    Start     Dose/Rate Route Frequency Ordered Stop   05/20/22 1600  cefTRIAXone (ROCEPHIN) 2 g in sodium chloride 0.9 % 100 mL IVPB        2 g 200 mL/hr over 30 Minutes Intravenous Every 24 hours 05/19/22 1841     05/19/22 1630  cefTRIAXone (ROCEPHIN) 2 g in sodium chloride 0.9 % 100 mL IVPB        2 g 200 mL/hr over 30 Minutes Intravenous  Once 05/19/22 1617 05/19/22 1753   05/19/22 1530  cefTRIAXone (ROCEPHIN) 1 g in sodium chloride 0.9 % 100 mL IVPB  Status:  Discontinued        1 g 200 mL/hr over 30 Minutes Intravenous  Once 05/19/22 1521 05/19/22 1617       Subjective: Examined at bedside think she is doing little bit better.  Been weaned to 8 L.  No chest pain or shortness of breath.  Think she is can breathe little bit easier.  Denies any lightheadedness or dizziness.  No other concerns or complaints at this time.  Objective: Vitals:   05/21/22 0000 05/21/22 0155 05/21/22 0400 05/21/22 0719  BP: (!) 142/37     Pulse: 86 82  80  Resp: (!) 31 (!) 25  (!) 25  Temp: (!) 100.4 F (38 C)  98.5 F (36.9 C)   TempSrc: Oral  Oral   SpO2: 94% 95%  96%  Weight:      Height:        Intake/Output Summary (Last 24 hours) at 05/21/2022 0729 Last data filed at 05/21/2022 0500 Gross per 24 hour  Intake 1002.85 ml  Output 3250 ml  Net -2247.15 ml   Filed Weights   05/19/22 1121 05/20/22 0500  Weight: 52.2 kg 60.3 kg   Examination: Physical Exam:  Constitutional: WN/WD elderly chronically ill-appearing Caucasian female in no acute distress Respiratory: Diminished to auscultation bilaterally with coarse breath sounds and has some crackles and some slight rhonchi.  No appreciable wheezing or rales.  Has a normal respiratory effort and is slightly tachypneic but is not using any accessory muscles to breathe and is on supplemental oxygen via nasal cannula.   Cardiovascular: RRR, no murmurs / rubs / gallops. S1 and S2 auscultated.  Mild extremity edema Abdomen: Soft, non-tender, non-distended. Bowel sounds positive.  GU: Deferred. Musculoskeletal: No clubbing / cyanosis of digits/nails. No joint deformity upper and lower extremities.  Skin: No rashes, lesions, ulcers on a limited skin evaluation. No induration; Warm and dry.  Neurologic: CN 2-12 grossly intact with no focal deficits. Romberg sign and cerebellar reflexes not assessed.  Psychiatric: Normal judgment and insight. Alert and oriented x 3. Normal mood and appropriate affect.   Data Reviewed: I have personally reviewed following labs and imaging studies  CBC: Recent Labs  Lab 05/19/22 1127 05/20/22 0019 05/21/22 0306  WBC 19.0* 20.1* 19.5*  HGB 11.8* 9.9* 10.5*  HCT 34.6* 29.4* 31.9*  MCV 95.3 99.3 98.5  PLT 202 150 [id number]   Basic Metabolic Panel: Recent Labs  Lab 05/19/22 1127 05/19/22 2233 05/20/22 0019 05/20/22 0739 05/20/22 1711 05/21/22 0306  NA 129* 134* 133*  --  134* 133*  K 2.9* 3.3* 3.1*  --  3.8 3.7  CL 93* 107 107  --  102 104  CO2 25 21* 20*  --  23 22  GLUCOSE 128* 103* 101*  --  131* 121*  BUN 36* 34* 36*  --  31* 31*  CREATININE 1.58* 1.67* 1.59*  --  1.46* 1.34*  CALCIUM 8.3* 6.8* 6.8*  --  7.1* 7.0*  MG  --  1.7  --  1.8  --   --   PHOS  --   --   --  3.5  --   --    GFR: Estimated Creatinine Clearance: 24.7 mL/min (A) (by C-G formula based on SCr of 1.34 mg/dL (H)). Liver Function Tests: Recent Labs  Lab 05/19/22 2233 05/20/22 0019  AST 38 39  ALT 22 23  ALKPHOS 109 71  BILITOT 0.5 0.6  PROT 5.0* 5.0*  ALBUMIN 2.6* 2.6*   No results for input(s): "LIPASE", "AMYLASE" in the last 168 hours. No results for input(s): "AMMONIA" in the last 168 hours. Coagulation Profile: No results for input(s): "INR", "PROTIME" in the last 168 hours. Cardiac Enzymes: Recent Labs  Lab 05/19/22 2233  CKTOTAL 118   BNP (last 3 results) No results for  input(s): "PROBNP" in the last 8760 hours. HbA1C: Recent Labs    05/20/22 0739  HGBA1C 6.2*   CBG: No results for input(s): "GLUCAP" in the last 168 hours. Lipid Profile: No results for input(s): "CHOL", "HDL", "LDLCALC", "TRIG", "CHOLHDL", "LDLDIRECT" in the last 72 hours. Thyroid Function Tests: Recent Labs    05/20/22 0019 05/20/22 0739 05/21/22 0306  TSH 0.189*  --   --   FREET4  --    < > 1.12   < > = values in this interval not displayed.   Anemia Panel: Recent Labs    05/20/22 0019  VITAMINB12 203   Sepsis Labs: Recent Labs  Lab 05/19/22 1127 05/19/22 1625 05/20/22 0019  LATICACIDVEN 1.6 1.6 1.8   Recent Results (from the past 240 hour(s))  Resp panel by RT-PCR (RSV, Flu A&B, Covid) Anterior Nasal Swab     Status: None   Collection Time: 05/19/22 10:07 AM   Specimen: Anterior Nasal Swab  Result Value Ref Range Status   SARS Coronavirus 2 by RT PCR NEGATIVE NEGATIVE Final    Comment: (NOTE) SARS-CoV-2 target nucleic acids are NOT DETECTED.  The SARS-CoV-2 RNA is generally detectable in upper respiratory specimens during the acute phase of infection. The lowest concentration of SARS-CoV-2 viral copies this assay can detect is 138 copies/mL. A negative result does not preclude SARS-Cov-2 infection and should not be used as the sole basis for treatment or other patient management decisions. A negative result may occur with  improper specimen collection/handling, submission of specimen other than nasopharyngeal swab, presence of viral mutation(s) within the areas targeted by this assay, and inadequate number of viral copies(<138 copies/mL). A negative result must be combined with clinical observations, patient history, and epidemiological information. The expected result is Negative.  Fact Sheet for Patients:  EntrepreneurPulse.com.au  Fact Sheet for Healthcare Providers:  IncredibleEmployment.be  This test is no t  yet approved or cleared by the [place] and  has been authorized for  detection and/or diagnosis of SARS-CoV-2 by FDA under an Emergency Use Authorization (EUA). This EUA will remain  in effect (meaning this test can be used) for the duration of the COVID-19 declaration under Section 564(b)(1) of the Act, 21 U.S.C.section 360bbb-3(b)(1), unless the authorization is terminated  or revoked sooner.       Influenza A by PCR NEGATIVE NEGATIVE Final   Influenza B by PCR NEGATIVE NEGATIVE Final    Comment: (NOTE) The Xpert Xpress SARS-CoV-2/FLU/RSV plus assay is intended as an aid in the diagnosis of influenza from Nasopharyngeal swab specimens and should not be used as a sole basis for treatment. Nasal washings and aspirates are unacceptable for Xpert Xpress SARS-CoV-2/FLU/RSV testing.  Fact Sheet for Patients: EntrepreneurPulse.com.au  Fact Sheet for Healthcare Providers: IncredibleEmployment.be  This test is not yet approved or cleared by the [place] FDA and has been authorized for detection and/or diagnosis of SARS-CoV-2 by FDA under an Emergency Use Authorization (EUA). This EUA will remain in effect (meaning this test can be used) for the duration of the COVID-19 declaration under Section 564(b)(1) of the Act, 21 U.S.C. section 360bbb-3(b)(1), unless the authorization is terminated or revoked.     Resp Syncytial Virus by PCR NEGATIVE NEGATIVE Final    Comment: (NOTE) Fact Sheet for Patients: EntrepreneurPulse.com.au  Fact Sheet for Healthcare Providers: IncredibleEmployment.be  This test is not yet approved or cleared by the [place] FDA and has been authorized for detection and/or diagnosis of SARS-CoV-2 by FDA under an Emergency Use Authorization (EUA). This EUA will remain in effect (meaning this test can be used) for the duration of the COVID-19 declaration under Section 564(b)(1)  of the Act, 21 U.S.C. section 360bbb-3(b)(1), unless the authorization is terminated or revoked.  Performed at [facility], [place] [address], [place], [place] [address]   Blood culture (routine x 2)     Status: None (Preliminary result)   Collection Time: 05/19/22 11:17 AM   Specimen: Left Antecubital; Blood  Result Value Ref Range Status   Specimen Description   Final    LEFT ANTECUBITAL BLOOD Performed at [facility], [place] [address], [place], [place] [address]    Special Requests   Final    BOTTLES DRAWN AEROBIC AND ANAEROBIC Blood Culture results may not be optimal due to an excessive volume of blood received in culture bottles Performed at [place] [address] [place], [place] [address]    Culture  Setup Time   Final    GRAM NEGATIVE RODS IN BOTH AEROBIC AND ANAEROBIC BOTTLES CRITICAL VALUE NOTED.  VALUE IS CONSISTENT WITH PREVIOUSLY REPORTED AND CALLED VALUE. Performed at [facility], [place] [address] [place], [place] [address]    Culture GRAM NEGATIVE RODS  Final   Report Status PENDING  Incomplete  Blood culture (routine x 2)     Status: None (Preliminary result)   Collection Time: 05/19/22 11:25 AM   Specimen: Right Antecubital; Blood  Result Value Ref Range Status   Specimen Description   Final    RIGHT ANTECUBITAL BLOOD Performed at [facility], [place] [address] [place], [place] [address]    Special Requests   Final    BOTTLES DRAWN AEROBIC AND ANAEROBIC Blood Culture results may not be optimal due to an excessive volume of blood received in culture bottles Performed at [place] [address] [place], [place] [address]    Culture  Setup Time   Final  GRAM NEGATIVE RODS IN BOTH AEROBIC AND ANAEROBIC BOTTLES CRITICAL RESULT CALLED TO, READ BACK BY AND VERIFIED WITH: E JACKSON,PHARMD'@0320'$  05/20/22 [place] Performed at [facility], [place] [address], [place], [place]  [address]    Culture GRAM NEGATIVE RODS  Final   Report Status PENDING  Incomplete  Blood Culture ID Panel (Reflexed)     Status: Abnormal   Collection Time: 05/19/22 11:25 AM  Result Value Ref Range Status   Enterococcus faecalis NOT DETECTED NOT DETECTED Final   Enterococcus Faecium NOT DETECTED NOT DETECTED Final   Listeria monocytogenes NOT DETECTED NOT DETECTED Final   Staphylococcus species NOT DETECTED NOT DETECTED Final   Staphylococcus aureus (BCID) NOT DETECTED NOT DETECTED Final   Staphylococcus epidermidis NOT DETECTED NOT DETECTED Final   Staphylococcus lugdunensis NOT DETECTED NOT DETECTED Final   Streptococcus species NOT DETECTED NOT DETECTED Final   Streptococcus agalactiae NOT DETECTED NOT DETECTED Final   Streptococcus pneumoniae NOT DETECTED NOT DETECTED Final   Streptococcus pyogenes NOT DETECTED NOT DETECTED Final   A.calcoaceticus-baumannii NOT DETECTED NOT DETECTED Final   Bacteroides fragilis NOT DETECTED NOT DETECTED Final   Enterobacterales DETECTED (A) NOT DETECTED Final    Comment: Enterobacterales represent a large order of gram negative bacteria, not a single organism. CRITICAL RESULT CALLED TO, READ BACK BY AND VERIFIED WITH: E JACKSON,PHARMD'@0320'$  05/20/22 [place]    Enterobacter cloacae complex NOT DETECTED NOT DETECTED Final   Escherichia coli DETECTED (A) NOT DETECTED Final    Comment: CRITICAL RESULT CALLED TO, READ BACK BY AND VERIFIED WITH: E JACKSON,PHARMD'@0323'$  05/20/22 [place]    Klebsiella aerogenes NOT DETECTED NOT DETECTED Final   Klebsiella oxytoca NOT DETECTED NOT DETECTED Final   Klebsiella pneumoniae NOT DETECTED NOT DETECTED Final   Proteus species NOT DETECTED NOT DETECTED Final   Salmonella species NOT DETECTED NOT DETECTED Final   Serratia marcescens NOT DETECTED NOT DETECTED Final   Haemophilus influenzae NOT DETECTED NOT DETECTED Final   Neisseria meningitidis NOT DETECTED NOT DETECTED Final   Pseudomonas aeruginosa NOT DETECTED NOT  DETECTED Final   Stenotrophomonas maltophilia NOT DETECTED NOT DETECTED Final   Candida albicans NOT DETECTED NOT DETECTED Final   Candida auris NOT DETECTED NOT DETECTED Final   Candida glabrata NOT DETECTED NOT DETECTED Final   Candida krusei NOT DETECTED NOT DETECTED Final   Candida parapsilosis NOT DETECTED NOT DETECTED Final   Candida tropicalis NOT DETECTED NOT DETECTED Final   Cryptococcus neoformans/gattii NOT DETECTED NOT DETECTED Final   CTX-M ESBL NOT DETECTED NOT DETECTED Final   Carbapenem resistance IMP NOT DETECTED NOT DETECTED Final   Carbapenem resistance KPC NOT DETECTED NOT DETECTED Final   Carbapenem resistance NDM NOT DETECTED NOT DETECTED Final   Carbapenem resist OXA 48 LIKE NOT DETECTED NOT DETECTED Final   Carbapenem resistance VIM NOT DETECTED NOT DETECTED Final    Comment: Performed at [facility], [address] [place], [place] [address]  Urine Culture     Status: Abnormal   Collection Time: 05/19/22  4:40 PM   Specimen: Urine, Random  Result Value Ref Range Status   Specimen Description   Final    URINE, RANDOM Performed at [place] [address] [place], [place] [address]    Special Requests   Final    NONE Reflexed from [phone] [phone] Performed at [place] [address] [place], [place] [address]    Culture >=100,000 COLONIES/mL ESCHERICHIA COLI (A)  Final   Report  Status 05/21/2022 FINAL  Final   Organism ID, Bacteria ESCHERICHIA COLI (A)  Final      Susceptibility   Escherichia coli - MIC*    AMPICILLIN 4 SENSITIVE Sensitive     CEFAZOLIN <=4 SENSITIVE Sensitive     CEFEPIME <=0.12 SENSITIVE Sensitive     CEFTRIAXONE <=0.25 SENSITIVE Sensitive     CIPROFLOXACIN <=0.25 SENSITIVE Sensitive     GENTAMICIN <=1 SENSITIVE Sensitive     IMIPENEM <=0.25 SENSITIVE Sensitive     NITROFURANTOIN <=16 SENSITIVE Sensitive     TRIMETH/SULFA <=20 SENSITIVE Sensitive     AMPICILLIN/SULBACTAM <=2 SENSITIVE  Sensitive     PIP/TAZO <=4 SENSITIVE Sensitive     * >=100,000 COLONIES/mL ESCHERICHIA COLI  MRSA Next Gen by PCR, Nasal     Status: Abnormal   Collection Time: 05/19/22  8:13 PM   Specimen: Nasal Mucosa; Nasal Swab  Result Value Ref Range Status   MRSA by PCR Next Gen DETECTED (A) NOT DETECTED Final    Comment: (NOTE) The GeneXpert MRSA Assay (FDA approved for NASAL specimens only), is one component of a comprehensive MRSA colonization surveillance program. It is not intended to diagnose MRSA infection nor to guide or monitor treatment for MRSA infections. Test performance is not FDA approved in patients less than 47 years old. Performed at [facility], [place] [address] [place], [place] [address]     Radiology Studies: ECHOCARDIOGRAM COMPLETE  Result Date: 05/20/2022    ECHOCARDIOGRAM REPORT   Patient Name:   SAMMARA WAREHAM DER Hicks Date of Exam: 05/20/2022 Medical Rec #:  [id number]               Height:       61.0 in Accession #:    [id number]              Weight:       132.9 lb Date of Birth:  05-Aug-1932                BSA:          1.588 m Patient Age:    44 years                BP:           140/57 mmHg Patient Gender: F                       HR:           88 bpm. Exam Location:  Inpatient Procedure: 2D Echo Indications:    bacteremia  History:        Patient has no prior history of Echocardiogram examinations.                 Risk Factors:Hypertension and Dyslipidemia.  Sonographer:    Harvie Junior Referring Phys: [id number] Luxora MARSHALL  Sonographer Comments: Echo performed with patient supine and on artificial respirator and Technically difficult study due to poor echo windows. IMPRESSIONS  1. Left ventricular ejection fraction, by estimation, is 60 to 65%. The left ventricle has normal function. The left ventricle has no regional wall motion abnormalities. Left ventricular diastolic parameters were normal.  2. Right ventricular systolic function is normal. The right  ventricular size is normal. There is normal pulmonary artery systolic pressure. The estimated right ventricular systolic pressure is [id number] mmHg.  3. The mitral valve is degenerative. Trivial mitral valve regurgitation. No evidence of mitral stenosis.  4. The aortic valve is  calcified. There is moderate calcification of the aortic valve. There is mild thickening of the aortic valve. Aortic valve regurgitation is not visualized. Aortic valve sclerosis is present, with no evidence of aortic valve stenosis.  5. The inferior vena cava is normal in size with greater than 50% respiratory variability, suggesting right atrial pressure of 3 mmHg. Conclusion(s)/Recommendation(s): No evidence of valvular vegetations on this transthoracic echocardiogram. Consider a transesophageal echocardiogram to exclude infective endocarditis if clinically indicated. FINDINGS  Left Ventricle: Left ventricular ejection fraction, by estimation, is 60 to 65%. The left ventricle has normal function. The left ventricle has no regional wall motion abnormalities. The left ventricular internal cavity size was normal in size. There is  no left ventricular hypertrophy. Left ventricular diastolic parameters were normal. Right Ventricle: The right ventricular size is normal. No increase in right ventricular wall thickness. Right ventricular systolic function is normal. There is normal pulmonary artery systolic pressure. The tricuspid regurgitant velocity is 2.50 m/s, and  with an assumed right atrial pressure of 3 mmHg, the estimated right ventricular systolic pressure is [id number] mmHg. Left Atrium: Left atrial size was normal in size. Right Atrium: Right atrial size was normal in size. Pericardium: There is no evidence of pericardial effusion. Mitral Valve: The mitral valve is degenerative in appearance. Mild to moderate mitral annular calcification. Trivial mitral valve regurgitation. No evidence of mitral valve stenosis. Tricuspid Valve: The tricuspid  valve is grossly normal. Tricuspid valve regurgitation is trivial. No evidence of tricuspid stenosis. Aortic Valve: The aortic valve is calcified. There is moderate calcification of the aortic valve. There is mild thickening of the aortic valve. Aortic valve regurgitation is not visualized. Aortic valve sclerosis is present, with no evidence of aortic valve stenosis. Aortic valve mean gradient measures 7.5 mmHg. Aortic valve peak gradient measures 13.2 mmHg. Aortic valve area, by VTI measures 1.72 cm. Pulmonic Valve: The pulmonic valve was grossly normal. Pulmonic valve regurgitation is not visualized. No evidence of pulmonic stenosis. Aorta: The aortic root is normal in size and structure. Venous: The inferior vena cava is normal in size with greater than 50% respiratory variability, suggesting right atrial pressure of 3 mmHg. IAS/Shunts: The atrial septum is grossly normal.  LEFT VENTRICLE PLAX 2D LVIDd:         3.30 cm     Diastology LVIDs:         1.90 cm     LV e' medial:    8.59 cm/s LV PW:         0.90 cm     LV E/e' medial:  15.4 LV IVS:        0.90 cm     LV e' lateral:   12.10 cm/s LVOT diam:     1.90 cm     LV E/e' lateral: 10.9 LV SV:         53 LV SV Index:   33 LVOT Area:     2.84 cm                             3D Volume EF: LV Volumes (MOD)           3D EF:        69 % LV vol d, MOD A2C: 77.5 ml LV EDV:       101 ml LV vol d, MOD A4C: 60.6 ml LV ESV:       31 ml LV vol s, MOD A2C: 23.5  ml LV SV:        69 ml LV vol s, MOD A4C: 21.3 ml LV SV MOD A2C:     54.0 ml LV SV MOD A4C:     60.6 ml LV SV MOD BP:      49.1 ml RIGHT VENTRICLE RV Basal diam:  2.90 cm RV Mid diam:    2.20 cm RV S prime:     17.40 cm/s TAPSE (M-mode): 2.0 cm LEFT ATRIUM             Index        RIGHT ATRIUM           Index LA diam:        3.10 cm 1.95 cm/m   RA Area:     10.60 cm LA Vol (A2C):   31.1 ml 19.59 ml/m  RA Volume:   24.30 ml  15.30 ml/m LA Vol (A4C):   47.5 ml 29.92 ml/m LA Biplane Vol: 39.7 ml 25.00 ml/m  AORTIC  VALVE                     PULMONIC VALVE AV Area (Vmax):    1.75 cm      PV Vmax:       1.27 m/s AV Area (Vmean):   1.64 cm      PV Peak grad:  6.5 mmHg AV Area (VTI):     1.72 cm AV Vmax:           182.00 cm/s AV Vmean:          126.000 cm/s AV VTI:            0.306 m AV Peak Grad:      13.2 mmHg AV Mean Grad:      7.5 mmHg LVOT Vmax:         112.50 cm/s LVOT Vmean:        72.850 cm/s LVOT VTI:          0.186 m LVOT/AV VTI ratio: 0.61  AORTA Ao Root diam: 3.10 cm MITRAL VALVE                TRICUSPID VALVE MV Area (PHT): 4.39 cm     TR Peak grad:   25.0 mmHg MV Decel Time: 173 msec     TR Vmax:        250.00 cm/s MR Peak grad: 70.4 mmHg MR Vmax:      419.50 cm/s   SHUNTS MV E velocity: 132.00 cm/s  Systemic VTI:  0.19 m MV A velocity: 130.00 cm/s  Systemic Diam: 1.90 cm MV E/A ratio:  1.02 Eleonore Chiquito MD Electronically signed by Eleonore Chiquito MD Signature Date/Time: 05/20/2022/6:59:43 PM    Final    CT CHEST WO CONTRAST  Result Date: 05/20/2022 CLINICAL DATA:  Hypoxia.  Abnormal chest x-ray EXAM: CT CHEST WITHOUT CONTRAST TECHNIQUE: Multidetector CT imaging of the chest was performed following the standard protocol without IV contrast. RADIATION DOSE REDUCTION: This exam was performed according to the departmental dose-optimization program which includes automated exposure control, adjustment of the mA and/or kV according to patient size and/or use of iterative reconstruction technique. COMPARISON:  X-ray earlier 05/20/2022.  Prior CT scan 01/02/2020 FINDINGS: Cardiovascular: On this non IV contrast exam heart is nonenlarged. Significant mitral valve annular calcification. No pericardial effusion. Extensive calcifications along the thoracic aorta, great vessels and coronary arteries. Please correlate for any known significant stenosis of the great vessels. There is suggestion  of such involving the right subclavian artery, the brachiocephalic artery and the left common carotid. Additional contrast study as  clinically directed. Mediastinum/Nodes: No pneumomediastinum identified by CT scan. No specific abnormal lymph node enlargement identified on this noncontrast examination in the axillary region or hilum. There are some prominent nodes to the left of the aortic arch today. On image 47 of series 2 node measures 10 x 26 mm. Previously in October 2021 9 x 25 mm, not significantly changed. Other prominent mediastinal nodes also appear similar to that examination. Small hiatal hernia. Small thyroid gland. Lungs/Pleura: Small bilateral pleural effusions are identified, new from previous CT scan. Diffuse centrilobular emphysematous lung changes are identified as well. Both lower lobes show areas of interstitial thickening. There are bronchial wall thickening identified as well. Adjacent lung base opacities. Atelectasis favored over infiltrate. There is also reticulonodular changes in the middle lobe, lingula and inferior aspect of the upper lobes with tree-in-bud like areas these are increased from the study of 2021. No pneumothorax. Small right pleural calcification at the right lung base is stable. Upper Abdomen: Along the upper abdomen the adrenal glands are stable. Left adrenal gland is slightly thickened. Musculoskeletal: Diffuse anasarca some skin thickening, nonspecific. Scattered degenerative changes along the spine. IMPRESSION: No pneumomediastinum identified by CT. Bilateral small pleural effusions identified with adjacent lung opacities. There is increasing tree-in-bud, reticulonodular changes in both lungs. In addition there is developing bronchial wall thickening along both lower lobes with some interstitial thickening. Please correlate for an acute process. Short-term follow up in 3 months may be of some benefit. Underlying centrilobular emphysematous changes. Extensive vascular calcifications identified including along the great vessels with potential stenosis. If needed a follow up CT angiogram can be  performed as clinically directed. Similar mildly enlarged and borderline multiple mediastinal lymph nodes going back to 2021. Diffuse anasarca and skin thickening. Aortic Atherosclerosis (ICD10-I70.0) and Emphysema (ICD10-J43.9). Electronically Signed   By: Jill Side M.D.   On: 05/20/2022 12:52   US RENAL  Result Date: 05/20/2022 CLINICAL DATA:  [id number] AKI (acute kidney injury) ([place]) [id number] EXAM: RENAL / URINARY TRACT ULTRASOUND COMPLETE COMPARISON:  None Available. FINDINGS: The right kidney measured 11.5 cm and the left kidney measured 10.0 cm. The kidneys demonstrate increased echogenicity consistent with chronic medical renal disease. Right kidney midpole cyst measures 1.9 cm. This does not need to be evaluated further. No shadowing stones are seen. No hydronephrosis identified. The urinary bladder appeared unremarkable. Bladder volumes were not measured. IMPRESSION: 1. Echogenic kidneys consistent with chronic medical renal disease. 2. Right kidney cyst. Electronically Signed   By: Sammie Bench M.D.   On: 05/20/2022 08:16   DG CHEST PORT 1 VIEW  Result Date: 05/20/2022 CLINICAL DATA:  Hypoxia EXAM: PORTABLE CHEST 1 VIEW COMPARISON:  Chest x-rays dated 05/19/2022 and 01/24/2020. FINDINGS: New subtle lucencies within the mediastinum, suspicious for early pneumomediastinum. Increased interstitial markings bilaterally. No confluent opacity is seen to suggest a consolidating pneumonia. No pleural effusion or pneumothorax is seen. IMPRESSION: 1. New subtle lucencies within the mediastinum, suspicious for early pneumomediastinum. Recommend chest CT further characterization. 2. Increased interstitial markings bilaterally, suspicious for interstitial edema and/or atypical pneumonia superimposed on chronic interstitial lung disease. These results will be called to the ordering clinician or representative by the Radiologist Assistant, and communication documented in the PACS or [employer].  Electronically Signed   By: Franki Cabot M.D.   On: 05/20/2022 08:12   DG Chest 2 View  Result Date: 05/19/2022 CLINICAL  DATA:  Fever EXAM: CHEST - 2 VIEW COMPARISON:  01/24/2020 FINDINGS: The heart size and mediastinal contours are within normal limits. Aortic atherosclerosis. Both lungs are clear. The visualized skeletal structures are unremarkable. IMPRESSION: No active cardiopulmonary disease. Electronically Signed   By: Davina Poke D.O.   On: 05/19/2022 10:19    Scheduled Meds:  amLODipine  10 mg Oral Daily   aspirin  81 mg Oral Daily   atorvastatin  40 mg Oral Daily   Chlorhexidine Gluconate Cloth  6 each Topical Daily   cholecalciferol  5,000 Units Oral Daily   enoxaparin (LOVENOX) injection  30 mg Subcutaneous Q24H   fluticasone  2 spray Each Nare Daily   levothyroxine  125 mcg Oral Daily   melatonin  10 mg Oral QHS   multivitamin with minerals  1 tablet Oral Daily   mupirocin ointment  1 Application Nasal BID   primidone  200 mg Oral BID   sertraline  50 mg Oral Daily   Continuous Infusions:  sodium chloride Stopped (05/20/22 0948)   cefTRIAXone (ROCEPHIN)  IV Stopped (05/20/22 1653)   norepinephrine (LEVOPHED) Adult infusion Stopped (05/20/22 0631)    LOS: 2 days   Raiford Noble, DO [facility] Available via Epic secure chat 7am-7pm After these hours, please refer to coverage provider listed on [email or web address] 05/21/2022, 7:29 AM

## 2022-05-22 ENCOUNTER — Inpatient Hospital Stay (HOSPITAL_COMMUNITY): Payer: Medicare HMO

## 2022-05-22 DIAGNOSIS — N179 Acute kidney failure, unspecified: Secondary | ICD-10-CM | POA: Diagnosis not present

## 2022-05-22 DIAGNOSIS — A419 Sepsis, unspecified organism: Secondary | ICD-10-CM | POA: Diagnosis not present

## 2022-05-22 DIAGNOSIS — A491 Streptococcal infection, unspecified site: Secondary | ICD-10-CM | POA: Diagnosis not present

## 2022-05-22 DIAGNOSIS — E871 Hypo-osmolality and hyponatremia: Secondary | ICD-10-CM | POA: Diagnosis not present

## 2022-05-22 LAB — COMPREHENSIVE METABOLIC PANEL
ALT: 49 U/L — ABNORMAL HIGH (ref 0–44)
AST: 92 U/L — ABNORMAL HIGH (ref 15–41)
Albumin: 2.7 g/dL — ABNORMAL LOW (ref 3.5–5.0)
Alkaline Phosphatase: 90 U/L (ref 38–126)
Anion gap: 9 (ref 5–15)
BUN: 27 mg/dL — ABNORMAL HIGH (ref 8–23)
CO2: 26 mmol/L (ref 22–32)
Calcium: 7.7 mg/dL — ABNORMAL LOW (ref 8.9–10.3)
Chloride: 98 mmol/L (ref 98–111)
Creatinine, Ser: 1.29 mg/dL — ABNORMAL HIGH (ref 0.44–1.00)
GFR, Estimated: 40 mL/min — ABNORMAL LOW (ref >60)
Glucose, Bld: 119 mg/dL — ABNORMAL HIGH (ref 70–99)
Potassium: 3.8 mmol/L (ref 3.5–5.1)
Sodium: 133 mmol/L — ABNORMAL LOW (ref 135–145)
Total Bilirubin: 0.5 mg/dL (ref 0.3–1.2)
Total Protein: 6.1 g/dL — ABNORMAL LOW (ref 6.5–8.1)

## 2022-05-22 LAB — CULTURE, BLOOD (ROUTINE X 2)

## 2022-05-22 LAB — CBC WITH DIFFERENTIAL/PLATELET
Abs Immature Granulocytes: 0.06 10*3/uL (ref 0.00–0.07)
Basophils Absolute: 0 10*3/uL (ref 0.0–0.1)
Basophils Relative: 0 %
Eosinophils Absolute: 0.1 10*3/uL (ref 0.0–0.5)
Eosinophils Relative: 1 %
HCT: 32.8 % — ABNORMAL LOW (ref 36.0–46.0)
Hemoglobin: 11 g/dL — ABNORMAL LOW (ref 12.0–15.0)
Immature Granulocytes: 1 %
Lymphocytes Relative: 7 %
Lymphs Abs: 0.8 10*3/uL (ref 0.7–4.0)
MCH: 32.5 pg (ref 26.0–34.0)
MCHC: 33.5 g/dL (ref 30.0–36.0)
MCV: 97 fL (ref 80.0–100.0)
Monocytes Absolute: 0.8 10*3/uL (ref 0.1–1.0)
Monocytes Relative: 7 %
Neutro Abs: 9 10*3/uL — ABNORMAL HIGH (ref 1.7–7.7)
Neutrophils Relative %: 84 %
Platelets: 168 10*3/uL (ref 150–400)
RBC: 3.38 MIL/uL — ABNORMAL LOW (ref 3.87–5.11)
RDW: 13.9 % (ref 11.5–15.5)
WBC: 10.7 10*3/uL — ABNORMAL HIGH (ref 4.0–10.5)
nRBC: 0 % (ref 0.0–0.2)

## 2022-05-22 LAB — PHOSPHORUS: Phosphorus: 2.4 mg/dL — ABNORMAL LOW (ref 2.5–4.6)

## 2022-05-22 LAB — MAGNESIUM: Magnesium: 2.1 mg/dL (ref 1.7–2.4)

## 2022-05-22 MED ORDER — LEVALBUTEROL HCL 0.63 MG/3ML IN NEBU
0.6300 mg | INHALATION_SOLUTION | Freq: Four times a day (QID) | RESPIRATORY_TRACT | Status: DC
  Administered 2022-05-22: 0.63 mg via RESPIRATORY_TRACT
  Filled 2022-05-22: qty 3

## 2022-05-22 MED ORDER — FUROSEMIDE 10 MG/ML IJ SOLN
20.0000 mg | Freq: Once | INTRAMUSCULAR | Status: AC
  Administered 2022-05-22: 20 mg via INTRAVENOUS
  Filled 2022-05-22: qty 2

## 2022-05-22 MED ORDER — AMOXICILLIN-POT CLAVULANATE 875-125 MG PO TABS
1.0000 | ORAL_TABLET | Freq: Two times a day (BID) | ORAL | Status: DC
  Administered 2022-05-22 – 2022-05-24 (×4): 1 via ORAL
  Filled 2022-05-22 (×4): qty 1

## 2022-05-22 MED ORDER — IPRATROPIUM BROMIDE 0.02 % IN SOLN
0.5000 mg | Freq: Four times a day (QID) | RESPIRATORY_TRACT | Status: DC
  Administered 2022-05-22: 0.5 mg via RESPIRATORY_TRACT
  Filled 2022-05-22: qty 2.5

## 2022-05-22 MED ORDER — IPRATROPIUM BROMIDE 0.02 % IN SOLN: 0.5000 mg | Freq: Four times a day (QID) | RESPIRATORY_TRACT | Status: DC | PRN

## 2022-05-22 MED ORDER — LEVALBUTEROL HCL 0.63 MG/3ML IN NEBU: 0.6300 mg | INHALATION_SOLUTION | Freq: Four times a day (QID) | RESPIRATORY_TRACT | Status: DC | PRN

## 2022-05-22 NOTE — Evaluation (Signed)
Physical Therapy Evaluation Patient Details Name: Brandy Hicks MRN: [id number] DOB: 10/04/1932 Today's Date: 05/22/2022  History of Present Illness  89 yo female with pmh hypothyroidism, essential tremor, hyperlipidemia, osteoporosis, GBS ~5 years ago, CKD3b. Presented to ed 3/4 with 2-3 days of weakness, worsening tremors, chills, fever, hypotension  Clinical Impression  Pt admitted with above diagnosis.  Pt currently with functional limitations due to the deficits listed below (see PT Problem List). Pt will benefit from skilled PT to increase their independence and safety with mobility to allow discharge to the venue listed below.     The patient ambulated x 100' on RA, no assistive device. Balance intermittently impaired but  mild. Patient independent and drives at baseline. Patient should progress to return home. Patient will benefit from HHPT.     Recommendations for follow up therapy are one component of a multi-disciplinary discharge planning process, led by the attending physician.  Recommendations may be updated based on patient status, additional functional criteria and insurance authorization.  Follow Up Recommendations Home health PT      Assistance Recommended at Discharge Set up Supervision/Assistance  Patient can return home with the following  A little help with bathing/dressing/bathroom;Assistance with cooking/housework;Assist for transportation    Equipment Recommendations None recommended by PT  Recommendations for Other Services       Functional Status Assessment Patient has had a recent decline in their functional status and demonstrates the ability to make significant improvements in function in a reasonable and predictable amount of time.     Precautions / Restrictions Precautions Precaution Comments: monitor sats Restrictions Weight Bearing Restrictions: No      Mobility  Bed Mobility Overal bed mobility: Independent                   Transfers Overall transfer level: Needs assistance Equipment used: None Transfers: Sit to/from Stand Sit to Stand: Min guard                Ambulation/Gait Ambulation/Gait assistance: Min guard, Supervision Gait Distance (Feet): 100 Feet Assistive device: None, 1 person hand held assist Gait Pattern/deviations: Step-through pattern, Drifts right/left Gait velocity: decr     General Gait Details: intermittently imbalanced when ambulating, did provide HHA at intevals  Stairs            Wheelchair Mobility    Modified Rankin (Stroke Patients Only)       Balance Overall balance assessment: Needs assistance Sitting-balance support: No upper extremity supported, Feet supported Sitting balance-Leahy Scale: Normal     Standing balance support: No upper extremity supported Standing balance-Leahy Scale: Fair Standing balance comment: mild dynamic  balance  slightly impaired- first ambulation in 3 days                             Pertinent Vitals/Pain Pain Assessment Pain Assessment: No/denies pain    Home Living Family/patient expects to be discharged to:: Private residence Living Arrangements: Alone Available Help at Discharge: Family;Friend(s);Available PRN/intermittently Type of Home: Independent living facility Home Access: Level entry       Home Layout: One level Home Equipment: Grab bars - tub/shower;Grab bars - toilet;Rolling Walker (2 wheels) Additional Comments: Independent living    Prior Function Prior Level of Function : Independent/Modified Independent;Driving             Mobility Comments: uses no device       Hand Dominance   Dominant Hand:  Right    Extremity/Trunk Assessment        Lower Extremity Assessment Lower Extremity Assessment: Overall WFL for tasks assessed    Cervical / Trunk Assessment Cervical / Trunk Assessment: Normal  Communication   Communication: No difficulties  Cognition  Arousal/Alertness: Awake/alert Behavior During Therapy: WFL for tasks assessed/performed Overall Cognitive Status: Within Functional Limits for tasks assessed                                          General Comments      Exercises     Assessment/Plan    PT Assessment Patient needs continued PT services  PT Problem List Decreased activity tolerance;Decreased mobility       PT Treatment Interventions Gait training;Functional mobility training;Therapeutic activities;Patient/family education    PT Goals (Current goals can be found in the Care Plan section)  Acute Rehab PT Goals Patient Stated Goal: go home PT Goal Formulation: With patient Time For Goal Achievement: 06/05/22 Potential to Achieve Goals: Good    Frequency Min 3X/week     Co-evaluation PT/OT/SLP Co-Evaluation/Treatment: Yes Reason for Co-Treatment: To address functional/ADL transfers PT goals addressed during session: Mobility/safety with mobility OT goals addressed during session: ADL's and self-care       AM-PAC PT "6 Clicks" Mobility  Outcome Measure Help needed turning from your back to your side while in a flat bed without using bedrails?: None Help needed moving from lying on your back to sitting on the side of a flat bed without using bedrails?: None Help needed moving to and from a bed to a chair (including a wheelchair)?: A Little Help needed standing up from a chair using your arms (e.g., wheelchair or bedside chair)?: A Little Help needed to walk in hospital room?: A Little Help needed climbing 3-5 steps with a railing? : A Little 6 Click Score: 20    End of Session   Activity Tolerance: Patient tolerated treatment well Patient left: in chair;with call bell/phone within reach;with chair alarm set;with family/visitor present Nurse Communication: Mobility status PT Visit Diagnosis: Unsteadiness on feet (R26.81)    Time: 1020-1036 PT Time Calculation (min) (ACUTE ONLY): 16  min   Charges:   PT Evaluation $PT Eval Low Complexity: 1 Low          [place] Office [phone] Weekend [phone]   Claretha Cooper 05/22/2022, 12:42 PM

## 2022-05-22 NOTE — Evaluation (Signed)
Occupational Therapy Evaluation Patient Details Name: Brandy Hicks Today's Date: 05/22/2022   History of Present Illness 89 yo female with pmh hypothyroidism, essential tremor, hyperlipidemia, osteoporosis, GBS ~5 years ago, CKD3b. Presented to ed 3/4 with 2-3 days of weakness, worsening tremors, chills, fever, hypotension   Clinical Impression   Brandy Hicks is an 89 year old woman who demonstrates independence with Adls and able to ambulate in hall without a device. Her o2 sat briefly dipped to 89% but recovered nicely. From an OT standpoint patient has no OT needs. Needs to ambulate while in hospital and may need PRN assist at home at discharge.       Recommendations for follow up therapy are one component of a multi-disciplinary discharge planning process, led by the attending physician.  Recommendations may be updated based on patient status, additional functional criteria and insurance authorization.   Follow Up Recommendations  No OT follow up     Assistance Recommended at Discharge PRN  Patient can return home with the following Assistance with cooking/housework    Functional Status Assessment  Patient has not had a recent decline in their functional status  Equipment Recommendations  None recommended by OT    Recommendations for Other Services       Precautions / Restrictions Precautions Precautions: None Precaution Comments: monitor sats Restrictions Weight Bearing Restrictions: No      Mobility Bed Mobility Overal bed mobility: Independent                  Transfers Overall transfer level: Needs assistance Equipment used: None Transfers: Sit to/from Stand Sit to Stand: Min guard                  Balance Overall balance assessment: Needs assistance Sitting-balance support: No upper extremity supported, Feet supported Sitting balance-Leahy Scale: Normal     Standing balance support: No  upper extremity supported Standing balance-Leahy Scale: Fair Standing balance comment: mild dynamic  balance  slightly impaired- first ambulation in 3 days                           ADL either performed or assessed with clinical judgement   ADL Overall ADL's : Independent                                             Vision Patient Visual Report: No change from baseline       Perception     Praxis      Pertinent Vitals/Pain Pain Assessment Pain Assessment: No/denies pain     Hand Dominance Right   Extremity/Trunk Assessment Upper Extremity Assessment Upper Extremity Assessment: Overall WFL for tasks assessed   Lower Extremity Assessment Lower Extremity Assessment: Defer to PT evaluation   Cervical / Trunk Assessment Cervical / Trunk Assessment: Normal   Communication Communication Communication: No difficulties   Cognition Arousal/Alertness: Awake/alert Behavior During Therapy: WFL for tasks assessed/performed Overall Cognitive Status: Within Functional Limits for tasks assessed                                       General Comments       Exercises     Shoulder Instructions  Home Living Family/patient expects to be discharged to:: Private residence Living Arrangements: Alone Available Help at Discharge: Family;Friend(s);Available PRN/intermittently Type of Home: Independent living facility Home Access: Level entry     Home Layout: One level     Bathroom Shower/Tub: Occupational psychologist: Handicapped height     Home Equipment: Grab bars - tub/shower;Grab bars - toilet;Rolling Walker (2 wheels)   Additional Comments: Independent living      Prior Functioning/Environment Prior Level of Function : Independent/Modified Independent;Driving             Mobility Comments: uses no device          OT Problem List:        OT Treatment/Interventions:      OT Goals(Current goals  can be found in the care plan section) Acute Rehab OT Goals OT Goal Formulation: All assessment and education complete, DC therapy  OT Frequency:      Co-evaluation   Reason for Co-Treatment: To address functional/ADL transfers PT goals addressed during session: Mobility/safety with mobility OT goals addressed during session: ADL's and self-care      AM-PAC OT "6 Clicks" Daily Activity     Outcome Measure Help from another person eating meals?: None Help from another person taking care of personal grooming?: None Help from another person toileting, which includes using toliet, bedpan, or urinal?: None Help from another person bathing (including washing, rinsing, drying)?: None Help from another person to put on and taking off regular upper body clothing?: None Help from another person to put on and taking off regular lower body clothing?: None 6 Click Score: 24   End of Session Equipment Utilized During Treatment: Rolling walker (2 wheels) Nurse Communication: Mobility status  Activity Tolerance: Patient tolerated treatment well Patient left: in chair;with call bell/phone within reach;with family/visitor present  OT Visit Diagnosis: Muscle weakness (generalized) (M62.81)                Time: 1020-1031 OT Time Calculation (min): 11 min Charges:  OT General Charges $OT Visit: 1 Visit OT Evaluation $OT Eval Low Complexity: 1 Low  Gustavo Lah, OTR/L [place]  Office [phone]   Lenward Chancellor 05/22/2022, 12:52 PM

## 2022-05-22 NOTE — Progress Notes (Signed)
PROGRESS NOTE    Brandy Hicks  [id number] DOB: April 13, 1932 DOA: 05/19/2022 PCP: Davina Poke, FNP   Brief Narrative:  89yo f w/ PH of HTN,essential tremor, hyperlipidemia, osteoporosis, hypothyroidism, Guillain-Barr syndrome about 5 years ago, CKD3b b/l creat of 1-1.2 presented to the ED with 3 days of Weakness  and shaky, worsening of baseline tremors. She c/o chills but no fever. Patient denies any nausea, vomiting, chest pain, shortness of breath,headache, focal weakness, numbness tingling, speech difficulties. She c/o food not being good at [employer] She was much worse today with difficulty standing walking getting up.  EMS was called noted fever of 102.  Patient denies any shortness of breath cough or respiratory symptoms sore throat nausea vomiting diarrhea.  In the ED, vital signs showed fever 103.1 respiration 24 BP 132/38 saturating 99% on room air, labs showed hyponatremia 129 hypokalemia 2.9 elevated BUN/creatinine 36/1.5 left acid 1.6 with leukocytosis 19 3 respiratory virus COVID/influenza RSV negative blood culture sent UA ordered pending, given a liter bolus, ceftriaxone, BP noted to be down 94/43> at this time half liter bolus ordered, repeat lactic acid ordered> I&O ordered to get [place] sample.  She was given 40kcl po. Admission was requested for further management. On admission BP in 108/42.   Interim History -Patient had to be placed on short-term pressors but is now slowly improving.  Further workup reveals that she has an E. coli bacteremia as well as UTI and a concomitant strep pneumo pneumonia  Assessment and Plan:  Septic Shock due to E. coli bacteremia and UTI with concomitant Strep Pneumo PNA -BP and lactic acid stable but she had to be temporarily placed on pressors and removed yesterday -placed on empiric ceftriaxone and will continue given that urinalysis is pansensitive and will change to p.o. Augmentin -was also given additional IV fluids however  overnight hypotensive needing additional IV fluid boluses and subsequently placed on peripheral Levophed, weaned off this the day before yesterday AM.   -WBC remains elevated, BC ID with E. coli follow-up blood/urine culture,  -BP on higher side.  Pulmonary critical team was following.  Serum cortisol normal 30.1.   -Renal ultrasound no acute finding.  In primary pulmonary team IVC appears full so holding off further IV fluids. Received IV Lasix x1 the day before yesterday and repeated yesterday and will repeat today (IV 20 mg x1) -WBC and LA trend: Recent Labs  Lab 05/19/22 1127 05/19/22 1625 05/20/22 0019 05/21/22 0306 05/22/22 0314  WBC 19.0*  --  20.1* 19.5* 10.7*  LATICACIDVEN 1.6   < > 1.8  --   --    < > = values in this interval not displayed.  -Strep pneumo urine antigen is positive -Chest x-ray yesterday showed "Trace bilateral pleural effusions and bibasilar atelectasis" and CXR today done and showed "There are no signs of pulmonary edema. Faint haziness in the lower lung fields may be due to crowding of normal bronchovascular structures or minimal effusions or early infiltrates."   Acute Respiratory Failure with Hypoxia -Increased interstitial markings bilaterally suspicious for interstitial edema and a/or atypical pneumonia,  subtle lucencies within the mediastinum-?Early pneumomediastinum -CT chest has been ordered defer to pulmonary-; CT Scan of the Chest done and showed "No pneumomediastinum identified by CT. Bilateral small pleural effusions identified with adjacent lung opacities. There is increasing tree-in-bud, reticulonodular changes in both lungs. In addition there is developing bronchial wall thickening along both lower lobes with some interstitial thickening. Please correlate for an acute process.  Short-term follow up in 3 months may be of some benefit. Underlying centrilobular emphysematous changes. Extensive vascular calcifications identified including along the great  vessels with potential stenosis. If needed a follow up CT angiogram can be performed as clinically directed. Similar mildly enlarged and borderline multiple mediastinal lymph nodes going back to 2021. Diffuse anasarca and skin thickening. Aortic Atherosclerosis (ICD10-I70.0) and Emphysema"  -SpO2: 95 % O2 Flow Rate (L/min): (S) 2 L/min FiO2 (%): 21 % -Continue supplemental oxygen.  Check procalcitonin if it is elevated will consider adding azithromycin or Doxy -Continuous pulse oximetry maintain O2 saturations greater than 90% -Continue supplemental oxygen via nasal cannula wean O2 as tolerated -She will need an ambulatory home O2 screen and a repeat chest x-ray prior to discharge   Underlying emphysematous lung changes- cont pulm support   Hypertension  -Normally on amlodipine 10, Chlorthalidone, Losartan at Home -Held antihypertensives due to sepsis but since blood pressure trending up may need to resume gradually.   -Will resume propranolol for now which is also for her ET  -Continue to Monitor BP per protocol  -Last BP reading was elevated at 156/132   HLD -C/w Atorvastatin 40 mg po Daily admitted to hold given abnormal LFTs  Abnormal LFTs -LFT Trend: Recent Labs  Lab 05/19/22 2233 05/20/22 0019 05/21/22 0300 05/22/22 0314  AST 38 39 52* 92*  ALT '22 23 26 '$ 49*  BILITOT 0.5 0.6 0.5 0.5  ALKPHOS 109 71 69 90  -Likely reactive but no need to closely monitor and if necessary will obtain a right upper quadrant ultrasound as well as an acute hepatitis panel in the AM -Repeat CBC in the AM   Hypophosphatemia -Phos Level Trend: Recent Labs  Lab 05/20/22 0739 05/21/22 0300 05/22/22 0314  PHOS 3.5 1.9* 2.4*  -Replete with po K Phos 500 mg BID x2 -Continue to Monitor and Replete as Necessary -Repeat Phos Level in the AM   GBS hx -Patient does have intact knee reflex no muscle weakness in proximal and distal muscle in leg or arms.   -Has generalized weakness.  If she has any  change in reflex/or weakness or any concerning symptoms will need to involve neurology -Will need PT/OT to evaluate and Treat and recommending Home Health    Hypothyroidism: TSH low 0.18 .  Checkied free T4 and was 1.1 -Will cut down Synthroid dose-will need to recheck as OP and decrease TSH dose if still low in 4-6 weeks -C/w Levothyroxine 125 mcg po Daily    Essential tremor chronic -Continue Primadone 200 mg po BID and will resume her propranolol   Strep Pneumo Urinary Ag -Suspected PNA -DG CXR as above -Abx as above    Acute urinary retention -Continue bladder scanning and INO cath -If continues to retain will need a Foley catheter   Leukocytosis -WBC Trend: Recent Labs  Lab 05/19/22 1127 05/20/22 0019 05/21/22 0306 05/22/22 0314  WBC 19.0* 20.1* 19.5* 10.7*  -Continue to Monitor and Trend for S/Sx of Infection and C/w Abx as above -Repeat CBC in the AM   Hyponatremia -Na+ Trend: Recent Labs  Lab 05/19/22 1127 05/19/22 2233 05/20/22 0019 05/20/22 1711 05/21/22 0306 05/22/22 0314  NA 129* 134* 133* 134* 133* 133*  -Continue to Monitor and Trend -Repeat CMP in the AM    Hypokalemia -Patient's K+ Level Trend: Recent Labs  Lab 05/19/22 1127 05/19/22 2233 05/20/22 0019 05/20/22 1711 05/21/22 0306 05/22/22 0314  K 2.9* 3.3* 3.1* 3.8 3.7 3.8  -Replete with po Kcl  40 mEQ x1 -Continue to Monitor and Replete as Necessary -Repeat CMP in the AM    Hypophosphatemia -Phos Level Trend: Recent Labs  Lab 05/20/22 0739 05/21/22 0300 05/22/22 0314  PHOS 3.5 1.9* 2.4*  -Replete with po K Phos 500 mg BID x2 -Continue to Monitor and Replete as Necessary  -Repeat Phos Level in the AM    Normocytic Anemia -Hgb/Hct Trend: Recent Labs  Lab 05/19/22 1127 05/20/22 0019 05/21/22 0306 05/22/22 0314  HGB 11.8* 9.9* 10.5* 11.0*  HCT 34.6* 29.4* 31.9* 32.8*  MCV 95.3 99.3 98.5 97.0  -Check Anemia Panel in the AM -Continue to Monitor for S/Sx of bleeding; no  overt bleeding noted -Repeat CBC in the AM    AKI on CKD Stage 3b -Baseline Creatinine ranging from 1-1.2 -BUN/Cr Trend: Recent Labs  Lab 05/19/22 1127 05/19/22 2233 05/20/22 0019 05/20/22 1711 05/21/22 0306 05/22/22 0314  BUN 36* 34* 36* 31* 31* 27*  CREATININE 1.58* 1.67* 1.59* 1.46* 1.34* 1.29*  -Avoid Nephrotoxic Medications, Contrast Dyes, Hypotension and Dehydration to Ensure Adequate Renal Perfusion and will need to Renally Adjust Meds -Continue to Monitor and Trend Renal Function carefully and repeat CMP in the AM    Hyperglycemia -Blood sugars ranging from 121-131 on daily BMP/CMP -Continue to to monitor and trend and place on sensitive NovoLog sliding scale insulin if necessary   Hypoalbuminemia -Patient's Albumin Trend: Recent Labs  Lab 05/19/22 2233 05/20/22 0019 05/21/22 0300 05/22/22 0314  ALBUMIN 2.6* 2.6* 2.5* 2.7*  -Continue to Monitor and Trend and repeat CMP in the AM   Goals of care  -Confirmed DNR status  DVT prophylaxis: enoxaparin (LOVENOX) injection 30 mg Start: 05/19/22 2200    Code Status: DNR Family Communication: Discussed with Family at bedside   Disposition Plan:  Level of care: Telemetry Status is: Inpatient Remains inpatient appropriate because: His further clinical improvement and anticipating discharge in next 24 to 48 hours   Consultants:  PCCM transfer  Procedures:  As delineated as above  Antimicrobials:  Anti-infectives (From admission, onward)    Start     Dose/Rate Route Frequency Ordered Stop   05/22/22 1800  amoxicillin-clavulanate (AUGMENTIN) 875-125 MG per tablet 1 tablet        1 tablet Oral Every 12 hours 05/22/22 1119     05/21/22 1400  Urelle (URELLE/URISED) 81 MG tablet 81 mg  Status:  Discontinued        1 tablet Oral 3 times daily 05/21/22 1240 05/21/22 1434   05/20/22 1600  cefTRIAXone (ROCEPHIN) 2 g in sodium chloride 0.9 % 100 mL IVPB  Status:  Discontinued        2 g 200 mL/hr over 30 Minutes  Intravenous Every 24 hours 05/19/22 1841 05/22/22 1119   05/19/22 1630  cefTRIAXone (ROCEPHIN) 2 g in sodium chloride 0.9 % 100 mL IVPB        2 g 200 mL/hr over 30 Minutes Intravenous  Once 05/19/22 1617 05/19/22 1753   05/19/22 1530  cefTRIAXone (ROCEPHIN) 1 g in sodium chloride 0.9 % 100 mL IVPB  Status:  Discontinued        1 g 200 mL/hr over 30 Minutes Intravenous  Once 05/19/22 1521 05/19/22 1617       Subjective: Seen and examined at bedside and she is doing little bit better.  She thinks her breathing is improving and she is weaned off of oxygen.  No nausea or vomiting.  Denies any lightheadedness or dizziness.  Objective: Vitals:   05/22/22  1308 05/22/22 1400 05/22/22 1417 05/22/22 1542  BP: (!) 150/52 (!) 164/55  (!) 156/132  Pulse: 84 81  87  Resp: (!) 29 (!) 21  16  Temp:    98.3 F (36.8 C)  TempSrc:    Oral  SpO2: 94% 94% 94% 95%  Weight:      Height:        Intake/Output Summary (Last 24 hours) at 05/22/2022 1805 Last data filed at 05/22/2022 1500 Gross per 24 hour  Intake --  Output 1025 ml  Net -1025 ml   Filed Weights   05/19/22 1121 05/20/22 0500 05/21/22 1144  Weight: 52.2 kg 60.3 kg 58.4 kg   Examination: Physical Exam:  Constitutional: WN/WD elderly chronically ill-appearing Caucasian female in no acute distress Respiratory: Diminished to auscultation bilaterally with coarse breath sounds and had some crackles and some slight rhonchi.,  No appreciable using or rales.  Has a normal respiratory effort and has been weaned off of supplemental oxygen nasal cannula Cardiovascular: RRR, no murmurs / rubs / gallops. S1 and S2 auscultated.  Trace extremity edema Abdomen: Soft, non-tender, non-distended. Bowel sounds positive.  GU: Deferred. Musculoskeletal: No clubbing / cyanosis of digits/nails. No joint deformity upper and lower extremities.  Skin: No rashes, lesions, ulcers limited skin evaluation. No induration; Warm and dry.  Neurologic: CN 2-12 grossly  intact with no focal deficits but does have some slight essential tremors. Romberg sign and cerebellar reflexes not assessed.  Psychiatric: Normal judgment and insight. Alert and oriented x 3. Normal mood and appropriate affect.   Data Reviewed: I have personally reviewed following labs and imaging studies  CBC: Recent Labs  Lab 05/19/22 1127 05/20/22 0019 05/21/22 0306 05/22/22 0314  WBC 19.0* 20.1* 19.5* 10.7*  NEUTROABS  --   --   --  9.0*  HGB 11.8* 9.9* 10.5* 11.0*  HCT 34.6* 29.4* 31.9* 32.8*  MCV 95.3 99.3 98.5 97.0  PLT 202 150 156 [id number]   Basic Metabolic Panel: Recent Labs  Lab 05/19/22 2233 05/20/22 0019 05/20/22 0739 05/20/22 1711 05/21/22 0300 05/21/22 0306 05/22/22 0314  NA 134* 133*  --  134*  --  133* 133*  K 3.3* 3.1*  --  3.8  --  3.7 3.8  CL 107 107  --  102  --  104 98  CO2 21* 20*  --  23  --  22 26  GLUCOSE 103* 101*  --  131*  --  121* 119*  BUN 34* 36*  --  31*  --  31* 27*  CREATININE 1.67* 1.59*  --  1.46*  --  1.34* 1.29*  CALCIUM 6.8* 6.8*  --  7.1*  --  7.0* 7.7*  MG 1.7  --  1.8  --  2.0  --  2.1  PHOS  --   --  3.5  --  1.9*  --  2.4*   GFR: Estimated Creatinine Clearance: 25.2 mL/min (A) (by C-G formula based on SCr of 1.29 mg/dL (H)). Liver Function Tests: Recent Labs  Lab 05/19/22 2233 05/20/22 0019 05/21/22 0300 05/22/22 0314  AST 38 39 52* 92*  ALT '22 23 26 '$ 49*  ALKPHOS 109 71 69 90  BILITOT 0.5 0.6 0.5 0.5  PROT 5.0* 5.0* 5.6* 6.1*  ALBUMIN 2.6* 2.6* 2.5* 2.7*   No results for input(s): "LIPASE", "AMYLASE" in the last 168 hours. No results for input(s): "AMMONIA" in the last 168 hours. Coagulation Profile: No results for input(s): "INR", "PROTIME" in the last 168 hours.  Cardiac Enzymes: Recent Labs  Lab 05/19/22 2233  CKTOTAL 118   BNP (last 3 results) No results for input(s): "PROBNP" in the last 8760 hours. HbA1C: Recent Labs    05/20/22 0739  HGBA1C 6.2*   CBG: No results for input(s): "GLUCAP" in the last  168 hours. Lipid Profile: No results for input(s): "CHOL", "HDL", "LDLCALC", "TRIG", "CHOLHDL", "LDLDIRECT" in the last 72 hours. Thyroid Function Tests: Recent Labs    05/20/22 0019 05/20/22 0739 05/21/22 0306  TSH 0.189*  --   --   FREET4  --    < > 1.12   < > = values in this interval not displayed.   Anemia Panel: Recent Labs    05/20/22 0019  VITAMINB12 203   Sepsis Labs: Recent Labs  Lab 05/19/22 1127 05/19/22 1625 05/20/22 0019  LATICACIDVEN 1.6 1.6 1.8   Recent Results (from the past 240 hour(s))  Resp panel by RT-PCR (RSV, Flu A&B, Covid) Anterior Nasal Swab     Status: None   Collection Time: 05/19/22 10:07 AM   Specimen: Anterior Nasal Swab  Result Value Ref Range Status   SARS Coronavirus 2 by RT PCR NEGATIVE NEGATIVE Final    Comment: (NOTE) SARS-CoV-2 target nucleic acids are NOT DETECTED.  The SARS-CoV-2 RNA is generally detectable in upper respiratory specimens during the acute phase of infection. The lowest concentration of SARS-CoV-2 viral copies this assay can detect is 138 copies/mL. A negative result does not preclude SARS-Cov-2 infection and should not be used as the sole basis for treatment or other patient management decisions. A negative result may occur with  improper specimen collection/handling, submission of specimen other than nasopharyngeal swab, presence of viral mutation(s) within the areas targeted by this assay, and inadequate number of viral copies(<138 copies/mL). A negative result must be combined with clinical observations, patient history, and epidemiological information. The expected result is Negative.  Fact Sheet for Patients:  EntrepreneurPulse.com.au  Fact Sheet for Healthcare Providers:  IncredibleEmployment.be  This test is no t yet approved or cleared by the [place] FDA and  has been authorized for detection and/or diagnosis of SARS-CoV-2 by FDA under an Emergency Use  Authorization (EUA). This EUA will remain  in effect (meaning this test can be used) for the duration of the COVID-19 declaration under Section 564(b)(1) of the Act, 21 U.S.C.section 360bbb-3(b)(1), unless the authorization is terminated  or revoked sooner.       Influenza A by PCR NEGATIVE NEGATIVE Final   Influenza B by PCR NEGATIVE NEGATIVE Final    Comment: (NOTE) The Xpert Xpress SARS-CoV-2/FLU/RSV plus assay is intended as an aid in the diagnosis of influenza from Nasopharyngeal swab specimens and should not be used as a sole basis for treatment. Nasal washings and aspirates are unacceptable for Xpert Xpress SARS-CoV-2/FLU/RSV testing.  Fact Sheet for Patients: EntrepreneurPulse.com.au  Fact Sheet for Healthcare Providers: IncredibleEmployment.be  This test is not yet approved or cleared by the [place] FDA and has been authorized for detection and/or diagnosis of SARS-CoV-2 by FDA under an Emergency Use Authorization (EUA). This EUA will remain in effect (meaning this test can be used) for the duration of the COVID-19 declaration under Section 564(b)(1) of the Act, 21 U.S.C. section 360bbb-3(b)(1), unless the authorization is terminated or revoked.     Resp Syncytial Virus by PCR NEGATIVE NEGATIVE Final    Comment: (NOTE) Fact Sheet for Patients: EntrepreneurPulse.com.au  Fact Sheet for Healthcare Providers: IncredibleEmployment.be  This test is not yet approved or cleared  by the [place] and has been authorized for detection and/or diagnosis of SARS-CoV-2 by FDA under an Emergency Use Authorization (EUA). This EUA will remain in effect (meaning this test can be used) for the duration of the COVID-19 declaration under Section 564(b)(1) of the Act, 21 U.S.C. section 360bbb-3(b)(1), unless the authorization is terminated or revoked.  Performed at [facility],  [place] [address] [place], [place] [address]   Blood culture (routine x 2)     Status: Abnormal   Collection Time: 05/19/22 11:17 AM   Specimen: Left Antecubital; Blood  Result Value Ref Range Status   Specimen Description   Final    LEFT ANTECUBITAL BLOOD Performed at [facility], [place] [address] [place], [place] [address]    Special Requests   Final    BOTTLES DRAWN AEROBIC AND ANAEROBIC Blood Culture results may not be optimal due to an excessive volume of blood received in culture bottles Performed at [facility] [address] [place], [place] [address]    Culture  Setup Time   Final    GRAM NEGATIVE RODS IN BOTH AEROBIC AND ANAEROBIC BOTTLES CRITICAL VALUE NOTED.  VALUE IS CONSISTENT WITH PREVIOUSLY REPORTED AND CALLED VALUE.    Culture (A)  Final    ESCHERICHIA COLI SUSCEPTIBILITIES PERFORMED ON PREVIOUS CULTURE WITHIN THE LAST 5 DAYS. Performed at [facility], [place] [address] [place], [place] [address]    Report Status 05/22/2022 FINAL  Final  Blood culture (routine x 2)     Status: Abnormal   Collection Time: 05/19/22 11:25 AM   Specimen: Right Antecubital; Blood  Result Value Ref Range Status   Specimen Description   Final    RIGHT ANTECUBITAL BLOOD Performed at [facility], [place] [address], [place], [place] [address]    Special Requests   Final    BOTTLES DRAWN AEROBIC AND ANAEROBIC Blood Culture results may not be optimal due to an excessive volume of blood received in culture bottles Performed at [facility] [address], [place], [place] [address]    Culture  Setup Time   Final    GRAM NEGATIVE RODS IN BOTH AEROBIC AND ANAEROBIC BOTTLES CRITICAL RESULT CALLED TO, READ BACK BY AND VERIFIED WITH: E JACKSON,PHARMD'@0320'$  05/20/22 [place] Performed at [facility], [address] [place], [place] [address]    Culture ESCHERICHIA COLI (A)  Final   Report Status 05/22/2022 FINAL  Final   Organism ID,  Bacteria ESCHERICHIA COLI  Final      Susceptibility   Escherichia coli - MIC*    AMPICILLIN 4 SENSITIVE Sensitive     CEFEPIME <=0.12 SENSITIVE Sensitive     CEFTAZIDIME <=1 SENSITIVE Sensitive     CEFTRIAXONE <=0.25 SENSITIVE Sensitive     CIPROFLOXACIN <=0.25 SENSITIVE Sensitive     GENTAMICIN <=1 SENSITIVE Sensitive     IMIPENEM <=0.25 SENSITIVE Sensitive     TRIMETH/SULFA <=20 SENSITIVE Sensitive     AMPICILLIN/SULBACTAM <=2 SENSITIVE Sensitive     PIP/TAZO <=4 SENSITIVE Sensitive     * ESCHERICHIA COLI  Blood Culture ID Panel (Reflexed)     Status: Abnormal   Collection Time: 05/19/22 11:25 AM  Result Value Ref Range Status   Enterococcus faecalis NOT DETECTED NOT DETECTED Final   Enterococcus Faecium NOT DETECTED NOT DETECTED Final   Listeria monocytogenes NOT DETECTED NOT DETECTED Final   Staphylococcus species NOT DETECTED NOT DETECTED Final   Staphylococcus  aureus (BCID) NOT DETECTED NOT DETECTED Final   Staphylococcus epidermidis NOT DETECTED NOT DETECTED Final   Staphylococcus lugdunensis NOT DETECTED NOT DETECTED Final   Streptococcus species NOT DETECTED NOT DETECTED Final   Streptococcus agalactiae NOT DETECTED NOT DETECTED Final   Streptococcus pneumoniae NOT DETECTED NOT DETECTED Final   Streptococcus pyogenes NOT DETECTED NOT DETECTED Final   A.calcoaceticus-baumannii NOT DETECTED NOT DETECTED Final   Bacteroides fragilis NOT DETECTED NOT DETECTED Final   Enterobacterales DETECTED (A) NOT DETECTED Final    Comment: Enterobacterales represent a large order of gram negative bacteria, not a single organism. CRITICAL RESULT CALLED TO, READ BACK BY AND VERIFIED WITH: E JACKSON,PHARMD'@0320'$  05/20/22 [place]    Enterobacter cloacae complex NOT DETECTED NOT DETECTED Final   Escherichia coli DETECTED (A) NOT DETECTED Final    Comment: CRITICAL RESULT CALLED TO, READ BACK BY AND VERIFIED WITH: E JACKSON,PHARMD'@0323'$  05/20/22 [place]    Klebsiella aerogenes NOT DETECTED NOT  DETECTED Final   Klebsiella oxytoca NOT DETECTED NOT DETECTED Final   Klebsiella pneumoniae NOT DETECTED NOT DETECTED Final   Proteus species NOT DETECTED NOT DETECTED Final   Salmonella species NOT DETECTED NOT DETECTED Final   Serratia marcescens NOT DETECTED NOT DETECTED Final   Haemophilus influenzae NOT DETECTED NOT DETECTED Final   Neisseria meningitidis NOT DETECTED NOT DETECTED Final   Pseudomonas aeruginosa NOT DETECTED NOT DETECTED Final   Stenotrophomonas maltophilia NOT DETECTED NOT DETECTED Final   Candida albicans NOT DETECTED NOT DETECTED Final   Candida auris NOT DETECTED NOT DETECTED Final   Candida glabrata NOT DETECTED NOT DETECTED Final   Candida krusei NOT DETECTED NOT DETECTED Final   Candida parapsilosis NOT DETECTED NOT DETECTED Final   Candida tropicalis NOT DETECTED NOT DETECTED Final   Cryptococcus neoformans/gattii NOT DETECTED NOT DETECTED Final   CTX-M ESBL NOT DETECTED NOT DETECTED Final   Carbapenem resistance IMP NOT DETECTED NOT DETECTED Final   Carbapenem resistance KPC NOT DETECTED NOT DETECTED Final   Carbapenem resistance NDM NOT DETECTED NOT DETECTED Final   Carbapenem resist OXA 48 LIKE NOT DETECTED NOT DETECTED Final   Carbapenem resistance VIM NOT DETECTED NOT DETECTED Final    Comment: Performed at [facility], [address] [place], [place] [address]  Urine Culture     Status: Abnormal   Collection Time: 05/19/22  4:40 PM   Specimen: Urine, Random  Result Value Ref Range Status   Specimen Description   Final    URINE, RANDOM Performed at [facility] [address], [place], [place] [address]    Special Requests   Final    NONE Reflexed from [phone] Performed at [facility] [address], [place], [place] [address]    Culture >=100,000 COLONIES/mL ESCHERICHIA COLI (A)  Final   Report Status 05/21/2022 FINAL  Final   Organism ID, Bacteria ESCHERICHIA COLI (A)  Final      Susceptibility    Escherichia coli - MIC*    AMPICILLIN 4 SENSITIVE Sensitive     CEFAZOLIN <=4 SENSITIVE Sensitive     CEFEPIME <=0.12 SENSITIVE Sensitive     CEFTRIAXONE <=0.25 SENSITIVE Sensitive     CIPROFLOXACIN <=0.25 SENSITIVE Sensitive     GENTAMICIN <=1 SENSITIVE Sensitive     IMIPENEM <=0.25 SENSITIVE Sensitive     NITROFURANTOIN <=16 SENSITIVE Sensitive     TRIMETH/SULFA <=20 SENSITIVE Sensitive     AMPICILLIN/SULBACTAM <=2 SENSITIVE Sensitive     PIP/TAZO <=4 SENSITIVE Sensitive     * >=  100,000 COLONIES/mL ESCHERICHIA COLI  MRSA Next Gen by PCR, Nasal     Status: Abnormal   Collection Time: 05/19/22  8:13 PM   Specimen: Nasal Mucosa; Nasal Swab  Result Value Ref Range Status   MRSA by PCR Next Gen DETECTED (A) NOT DETECTED Final    Comment: (NOTE) The GeneXpert MRSA Assay (FDA approved for NASAL specimens only), is one component of a comprehensive MRSA colonization surveillance program. It is not intended to diagnose MRSA infection nor to guide or monitor treatment for MRSA infections. Test performance is not FDA approved in patients less than 48 years old. Performed at [facility], [place] [address], [place], [place] [id number]     Radiology Studies: DG CHEST PORT 1 VIEW  Result Date: 05/22/2022 CLINICAL DATA:  Shortness of breath EXAM: PORTABLE CHEST 1 VIEW COMPARISON:  Previous studies including the examination of 05/21/2022 FINDINGS: Cardiac size is within normal limits. There are no signs of pulmonary edema. There is faint haziness in the lower lung fields. Right lateral CP angle is indistinct. There is no pneumothorax. IMPRESSION: There are no signs of pulmonary edema. Faint haziness in the lower lung fields may be due to crowding of normal bronchovascular structures or minimal effusions or early infiltrates. Electronically Signed   By: Elmer Picker M.D.   On: 05/22/2022 08:13   DG CHEST PORT 1 VIEW  Result Date: 05/21/2022 CLINICAL DATA:  Shortness of  breath EXAM: PORTABLE CHEST 1 VIEW COMPARISON:  Chest x-ray dated May 20, 2022 FINDINGS: Cardiac and mediastinal contours are unchanged. Trace bilateral pleural effusions and bibasilar atelectasis. No evidence of pneumothorax. IMPRESSION: Trace bilateral pleural effusions and bibasilar atelectasis. Electronically Signed   By: Yetta Glassman M.D.   On: 05/21/2022 13:21    Scheduled Meds:  amLODipine  10 mg Oral Daily   amoxicillin-clavulanate  1 tablet Oral Q12H   aspirin  81 mg Oral Daily   atorvastatin  40 mg Oral Daily   Chlorhexidine Gluconate Cloth  6 each Topical Daily   enoxaparin (LOVENOX) injection  30 mg Subcutaneous Q24H   feeding supplement  237 mL Oral TID BM   fluticasone  2 spray Each Nare Daily   guaiFENesin  600 mg Oral BID   levothyroxine  125 mcg Oral Daily   melatonin  10 mg Oral QHS   multivitamin with minerals  1 tablet Oral Daily   mupirocin ointment  1 Application Nasal BID   primidone  200 mg Oral BID   propranolol  60 mg Oral Daily   sertraline  50 mg Oral Daily   Continuous Infusions:  sodium chloride Stopped (05/20/22 0948)    LOS: 3 days   Raiford Noble, DO [facility] Available via Epic secure chat 7am-7pm After these hours, please refer to coverage provider listed on [email or web address] 05/22/2022, 6:05 PM

## 2022-05-22 NOTE — Progress Notes (Signed)
Report called to Leroy Libman LPN. All questions answered at this time. All patient belongings and paper chart transferred with patient. Patient was transported in the wheelchair by RN. Handoff completed.

## 2022-05-23 ENCOUNTER — Inpatient Hospital Stay (HOSPITAL_COMMUNITY): Payer: Medicare HMO

## 2022-05-23 DIAGNOSIS — N179 Acute kidney failure, unspecified: Secondary | ICD-10-CM | POA: Diagnosis not present

## 2022-05-23 DIAGNOSIS — R6521 Severe sepsis with septic shock: Secondary | ICD-10-CM | POA: Diagnosis not present

## 2022-05-23 LAB — CBC WITH DIFFERENTIAL/PLATELET
Abs Immature Granulocytes: 0.05 10*3/uL (ref 0.00–0.07)
Basophils Absolute: 0.1 10*3/uL (ref 0.0–0.1)
Basophils Relative: 1 %
Eosinophils Absolute: 0.2 10*3/uL (ref 0.0–0.5)
Eosinophils Relative: 2 %
HCT: 34.5 % — ABNORMAL LOW (ref 36.0–46.0)
Hemoglobin: 11.2 g/dL — ABNORMAL LOW (ref 12.0–15.0)
Immature Granulocytes: 1 %
Lymphocytes Relative: 14 %
Lymphs Abs: 1.2 10*3/uL (ref 0.7–4.0)
MCH: 32.7 pg (ref 26.0–34.0)
MCHC: 32.5 g/dL (ref 30.0–36.0)
MCV: 100.9 fL — ABNORMAL HIGH (ref 80.0–100.0)
Monocytes Absolute: 1.3 10*3/uL — ABNORMAL HIGH (ref 0.1–1.0)
Monocytes Relative: 15 %
Neutro Abs: 5.9 10*3/uL (ref 1.7–7.7)
Neutrophils Relative %: 67 %
Platelets: 141 10*3/uL — ABNORMAL LOW (ref 150–400)
RBC: 3.42 MIL/uL — ABNORMAL LOW (ref 3.87–5.11)
RDW: 13.9 % (ref 11.5–15.5)
WBC: 8.8 10*3/uL (ref 4.0–10.5)
nRBC: 0 % (ref 0.0–0.2)

## 2022-05-23 LAB — MAGNESIUM: Magnesium: 1.7 mg/dL (ref 1.7–2.4)

## 2022-05-23 LAB — RETICULOCYTES
Immature Retic Fract: 6.5 % (ref 2.3–15.9)
RBC.: 3.6 MIL/uL — ABNORMAL LOW (ref 3.87–5.11)
Retic Count, Absolute: 32 10*3/uL (ref 19.0–186.0)
Retic Ct Pct: 0.9 % (ref 0.4–3.1)

## 2022-05-23 LAB — COMPREHENSIVE METABOLIC PANEL
ALT: 53 U/L — ABNORMAL HIGH (ref 0–44)
AST: 84 U/L — ABNORMAL HIGH (ref 15–41)
Albumin: 2.7 g/dL — ABNORMAL LOW (ref 3.5–5.0)
Alkaline Phosphatase: 86 U/L (ref 38–126)
Anion gap: 11 (ref 5–15)
BUN: 18 mg/dL (ref 8–23)
CO2: 27 mmol/L (ref 22–32)
Calcium: 8.2 mg/dL — ABNORMAL LOW (ref 8.9–10.3)
Chloride: 93 mmol/L — ABNORMAL LOW (ref 98–111)
Creatinine, Ser: 1.13 mg/dL — ABNORMAL HIGH (ref 0.44–1.00)
GFR, Estimated: 47 mL/min — ABNORMAL LOW (ref >60)
Glucose, Bld: 92 mg/dL (ref 70–99)
Potassium: 3.3 mmol/L — ABNORMAL LOW (ref 3.5–5.1)
Sodium: 131 mmol/L — ABNORMAL LOW (ref 135–145)
Total Bilirubin: 0.5 mg/dL (ref 0.3–1.2)
Total Protein: 6.1 g/dL — ABNORMAL LOW (ref 6.5–8.1)

## 2022-05-23 LAB — IRON AND TIBC
Iron: 20 ug/dL — ABNORMAL LOW (ref 28–170)
Saturation Ratios: 13 % (ref 10.4–31.8)
TIBC: 157 ug/dL — ABNORMAL LOW (ref 250–450)
UIBC: 137 ug/dL

## 2022-05-23 LAB — PHOSPHORUS: Phosphorus: 3.5 mg/dL (ref 2.5–4.6)

## 2022-05-23 LAB — FOLATE: Folate: 25.3 ng/mL (ref >5.9)

## 2022-05-23 LAB — VITAMIN B12: Vitamin B-12: 518 pg/mL (ref 180–914)

## 2022-05-23 LAB — FERRITIN: Ferritin: 315 ng/mL — ABNORMAL HIGH (ref 11–307)

## 2022-05-23 MED ORDER — LEVALBUTEROL HCL 0.63 MG/3ML IN NEBU: 0.6300 mg | INHALATION_SOLUTION | Freq: Three times a day (TID) | RESPIRATORY_TRACT | Status: DC

## 2022-05-23 MED ORDER — LEVALBUTEROL HCL 0.63 MG/3ML IN NEBU
0.6300 mg | INHALATION_SOLUTION | Freq: Three times a day (TID) | RESPIRATORY_TRACT | Status: DC
  Administered 2022-05-23: 0.63 mg via RESPIRATORY_TRACT
  Filled 2022-05-23: qty 3

## 2022-05-23 MED ORDER — LEVALBUTEROL HCL 0.63 MG/3ML IN NEBU
0.6300 mg | INHALATION_SOLUTION | Freq: Two times a day (BID) | RESPIRATORY_TRACT | Status: DC
  Administered 2022-05-24: 0.63 mg via RESPIRATORY_TRACT
  Filled 2022-05-23: qty 3

## 2022-05-23 MED ORDER — POTASSIUM CHLORIDE CRYS ER 20 MEQ PO TBCR
40.0000 meq | EXTENDED_RELEASE_TABLET | Freq: Two times a day (BID) | ORAL | Status: AC
  Administered 2022-05-23 (×2): 40 meq via ORAL
  Filled 2022-05-23 (×2): qty 2

## 2022-05-23 MED ORDER — IPRATROPIUM BROMIDE 0.02 % IN SOLN: 0.5000 mg | Freq: Three times a day (TID) | RESPIRATORY_TRACT | Status: DC

## 2022-05-23 MED ORDER — MAGNESIUM SULFATE 2 GM/50ML IV SOLN
2.0000 g | Freq: Once | INTRAVENOUS | Status: AC
  Administered 2022-05-23: 2 g via INTRAVENOUS
  Filled 2022-05-23: qty 50

## 2022-05-23 MED ORDER — IPRATROPIUM BROMIDE 0.02 % IN SOLN
0.5000 mg | Freq: Two times a day (BID) | RESPIRATORY_TRACT | Status: DC
  Administered 2022-05-24: 0.5 mg via RESPIRATORY_TRACT
  Filled 2022-05-23: qty 2.5

## 2022-05-23 MED ORDER — IPRATROPIUM BROMIDE 0.02 % IN SOLN
0.5000 mg | Freq: Three times a day (TID) | RESPIRATORY_TRACT | Status: DC
  Administered 2022-05-23: 0.5 mg via RESPIRATORY_TRACT
  Filled 2022-05-23: qty 2.5

## 2022-05-23 NOTE — Progress Notes (Signed)
Mobility Specialist - Progress Note   05/23/22 1408  Oxygen Therapy  SpO2 94 %  O2 Device Nasal Cannula  O2 Flow Rate (L/min) 1 L/min  Mobility  Activity Ambulated with assistance in hallway  Level of Assistance Contact guard assist, steadying assist  Assistive Device Other (Comment) (hand held assist)  Distance Ambulated (ft) 350 ft  Activity Response Tolerated well  Mobility Referral Yes  $Mobility charge 1 Mobility   Pt received in bed and agreed to mobility, had no c/o pain nor discomfort during session. Pt returned to chair with all needs met.  Roderick Pee Mobility Specialist

## 2022-05-23 NOTE — Progress Notes (Signed)
PROGRESS NOTE    Brandy Hicks  [id number] DOB: Jan 30, 1933 DOA: 05/19/2022 PCP: Davina Poke, FNP   Brief Narrative:  89yo f w/ PH of HTN,essential tremor, hyperlipidemia, osteoporosis, hypothyroidism, Guillain-Barr syndrome about 5 years ago, CKD3b b/l creat of 1-1.2 presented to the ED with 3 days of Weakness  and shaky, worsening of baseline tremors. She c/o chills but no fever. Patient denies any nausea, vomiting, chest pain, shortness of breath,headache, focal weakness, numbness tingling, speech difficulties. She c/o food not being good at [employer] She was much worse today with difficulty standing walking getting up.  EMS was called noted fever of 102.  Patient denies any shortness of breath cough or respiratory symptoms sore throat nausea vomiting diarrhea.   In the ED, vital signs showed fever 103.1 respiration 24 BP 132/38 saturating 99% on room air, labs showed hyponatremia 129 hypokalemia 2.9 elevated BUN/creatinine 36/1.5 left acid 1.6 with leukocytosis 19 3 respiratory virus COVID/influenza RSV negative blood culture sent UA ordered pending, given a liter bolus, ceftriaxone, BP noted to be down 94/43> at this time half liter bolus ordered, repeat lactic acid ordered> I&O ordered to get [place] sample.  She was given 40kcl po. Admission was requested for further management. On admission BP in 108/42.    Interim History -Patient had to be placed on short-term pressors but is now slowly improving.  Further workup reveals that she has an E. coli bacteremia as well as UTI and a concomitant strep pneumo pneumonia.  She is slowly improving and was weaned off oxygen yesterday but had to be placed on it back overnight and she desaturated on ambulation.  Will add some more breathing treatments and encourage pulmonary toileting and observe another night and anticipate discharging home in next 24 to 48 hours.  LFTs are mildly elevated so we will get a right upper quadrant  ultrasound.   Assessment and Plan:  Septic Shock due to E. coli bacteremia and UTI with concomitant Strep Pneumo PNA -BP and lactic acid stable but she had to be temporarily placed on pressors and removed yesterday -placed on empiric ceftriaxone and will continue given that urinalysis is pansensitive and will change to p.o. Augmentin -was also given additional IV fluids however overnight hypotensive needing additional IV fluid boluses and subsequently placed on peripheral Levophed, weaned off this the day before yesterday AM.   -WBC remains elevated, BC ID with E. coli follow-up blood/urine culture,  -BP on higher side.  Pulmonary critical team was following.  Serum cortisol normal 30.1.   -Renal ultrasound no acute finding.  In primary pulmonary team IVC appears full so holding off further IV fluids.  She received IV Lasix 3 days in row but will hold today -WBC and LA trend: Recent Labs  Lab 05/19/22 1127 05/19/22 1625 05/20/22 0019 05/21/22 0306 05/22/22 0314 05/23/22 0551  WBC 19.0*  --  20.1* 19.5* 10.7* 8.8  LATICACIDVEN 1.6   < > 1.8  --   --   --    < > = values in this interval not displayed.  -Strep pneumo urine antigen is positive -Chest x-ray the day before yesterday showed "Trace bilateral pleural effusions and bibasilar atelectasis" and CXR yesterday done and showed "There are no signs of pulmonary edema. Faint haziness in the lower lung fields may be due to crowding of normal bronchovascular structures or minimal effusions or early infiltrates." -Patient desaturated will continue another day of treatment and add Xopenex and Atrovent and may need some  more additional diuresis in the morning but will repeat chest x-ray   Acute Respiratory Failure with Hypoxia -Increased interstitial markings bilaterally suspicious for interstitial edema and a/or atypical pneumonia,  subtle lucencies within the mediastinum-?Early pneumomediastinum -CT chest has been ordered defer to  pulmonary-; CT Scan of the Chest done and showed "No pneumomediastinum identified by CT. Bilateral small pleural effusions identified with adjacent lung opacities. There is increasing tree-in-bud, reticulonodular changes in both lungs. In addition there is developing bronchial wall thickening along both lower lobes with some interstitial thickening. Please correlate for an acute process. Short-term follow up in 3 months may be of some benefit. Underlying centrilobular emphysematous changes. Extensive vascular calcifications identified including along the great vessels with potential stenosis. If needed a follow up CT angiogram can be performed as clinically directed. Similar mildly enlarged and borderline multiple mediastinal lymph nodes going back to 2021. Diffuse anasarca and skin thickening. Aortic Atherosclerosis (ICD10-I70.0) and Emphysema"  -SpO2: 94 % O2 Flow Rate (L/min): 1 L/min FiO2 (%): 21 % -Continue supplemental oxygen.  Check procalcitonin if it is elevated will consider adding azithromycin or Doxy -Continuous pulse oximetry maintain O2 saturations greater than 90% -Continue supplemental oxygen via nasal cannula wean O2 as tolerated -She will need an ambulatory home O2 screen and a repeat chest x-ray prior to discharge; ambulatory home O2 screen done and she desaturated so we will continue another day and add back to Xopenex and Atrovent and repeat chest x-ray in the morning   Underlying emphysematous lung changes- cont pulm support   Hypertension  -Normally on amlodipine 10, Chlorthalidone, Losartan at Home -Held antihypertensives due to sepsis but since blood pressure trending up may need to resume gradually.   -Will resume propranolol for now which is also for her ET  -Continue to Monitor BP per protocol  -Last BP reading was elevated at 147/61   HLD -Hold atorvastatin 40 mg p.o. daily because of abnormal LFTs  Abnormal LFTs -LFT Trend: Recent Labs  Lab 05/19/22 2233  05/20/22 0019 05/21/22 0300 05/22/22 0314 05/23/22 0551  AST 38 39 52* 92* 84*  ALT '22 23 26 '$ 49* 53*  BILITOT 0.5 0.6 0.5 0.5 0.5  ALKPHOS 109 71 69 90 86  -Obtain a right upper quadrant ultrasound prior to discharge -Repeat CBC in the outpatient setting    Hypophosphatemia -Phos Level Trend: Recent Labs  Lab 05/20/22 0739 05/21/22 0300 05/22/22 0314 05/23/22 0551  PHOS 3.5 1.9* 2.4* 3.5  -Replete with po K Phos 500 mg BID x2 yesterday  -Continue to Monitor and Replete as Necessary -Repeat Phos Level in the outpatient setting    GBS hx -Patient does have intact knee reflex no muscle weakness in proximal and distal muscle in leg or arms.   -Has generalized weakness.  If she has any change in reflex/or weakness or any concerning symptoms will need to involve neurology -Will need PT/OT to evaluate and Treat and recommending Home Health    Hypothyroidism: TSH low 0.18 .  Checkied free T4 and was 1.1 -Will cut down Synthroid dose-will need to recheck as OP and decrease TSH dose if still low in 4-6 weeks -C/w Levothyroxine 125 mcg po Daily    Essential tremor chronic -Continue Primadone 200 mg po BID and will resume her propranolol   Strep Pneumo Urinary Ag -Suspected PNA -DG CXR as above -Abx as above    Acute urinary retention -Continue bladder scanning and INO cath -If continues to retain will need a Foley catheter  Leukocytosis -WBC Trend: Recent Labs  Lab 05/19/22 1127 05/20/22 0019 05/21/22 0306 05/22/22 0314 05/23/22 0551  WBC 19.0* 20.1* 19.5* 10.7* 8.8  -Continue to Monitor and Trend for S/Sx of Infection and C/w Abx as above -Repeat CBC in the AM   Hyponatremia -Na+ Trend: Recent Labs  Lab 05/19/22 1127 05/19/22 2233 05/20/22 0019 05/20/22 1711 05/21/22 0306 05/22/22 0314 05/23/22 0551  NA 129* 134* 133* 134* 133* 133* 131*  -Continue to Monitor and Trend -Repeat CMP in the AM    Hypokalemia -Patient's K+ Level Trend: Recent Labs  Lab  05/19/22 1127 05/19/22 2233 05/20/22 0019 05/20/22 1711 05/21/22 0306 05/22/22 0314 05/23/22 0551  K 2.9* 3.3* 3.1* 3.8 3.7 3.8 3.3*  -Replete with po Kcl 40 mEQ BID x2 -Continue to Monitor and Replete as Necessary -Repeat CMP in the AM     Normocytic Anemia -Hgb/Hct Trend: Recent Labs  Lab 05/19/22 1127 05/20/22 0019 05/21/22 0306 05/22/22 0314 05/23/22 0551  HGB 11.8* 9.9* 10.5* 11.0* 11.2*  HCT 34.6* 29.4* 31.9* 32.8* 34.5*  MCV 95.3 99.3 98.5 97.0 100.9*  -Checked Anemia Panel and showed an iron level of 20, UIBC 137, TIBC 157, saturation ratios of 13%, ferritin low at 315, folate level 25.3, vitamin B12 518 -Continue to Monitor for S/Sx of bleeding; no overt bleeding noted -Repeat CBC in the AM    AKI on CKD Stage 3b -Baseline Creatinine ranging from 1-1.2 -BUN/Cr Trend: Recent Labs  Lab 05/19/22 1127 05/19/22 2233 05/20/22 0019 05/20/22 1711 05/21/22 0306 05/22/22 0314 05/23/22 0551  BUN 36* 34* 36* 31* 31* 27* 18  CREATININE 1.58* 1.67* 1.59* 1.46* 1.34* 1.29* 1.13*  -Avoid Nephrotoxic Medications, Contrast Dyes, Hypotension and Dehydration to Ensure Adequate Renal Perfusion and will need to Renally Adjust Meds -Continue to Monitor and Trend Renal Function carefully and repeat CMP in the AM    Hyperglycemia -Blood sugars ranging from 92-131 on daily BMP/CMP -Continue to to monitor and trend and place on sensitive NovoLog sliding scale insulin if necessary   Hypoalbuminemia -Patient's Albumin Trend: Recent Labs  Lab 05/19/22 2233 05/20/22 0019 05/21/22 0300 05/22/22 0314 05/23/22 0551  ALBUMIN 2.6* 2.6* 2.5* 2.7* 2.7*  -Continue to Monitor and Trend and repeat CMP in the AM   Goals of care  -Confirmed DNR status   DVT prophylaxis: enoxaparin (LOVENOX) injection 30 mg Start: 05/19/22 2200    Code Status: DNR Family Communication: Discussed with the granddaughter at bedside  Disposition Plan:  Level of care: Telemetry Status is:  Inpatient Remains inpatient appropriate because: Patient is improving and desaturated today and anticipating discharging home in the next 24 to 48 hours   Consultants:  PCCM  Procedures:  As delineated as above  Antimicrobials:  Anti-infectives (From admission, onward)    Start     Dose/Rate Route Frequency Ordered Stop   05/22/22 1800  amoxicillin-clavulanate (AUGMENTIN) 875-125 MG per tablet 1 tablet        1 tablet Oral Every 12 hours 05/22/22 1119     05/21/22 1400  Urelle (URELLE/URISED) 81 MG tablet 81 mg  Status:  Discontinued        1 tablet Oral 3 times daily 05/21/22 1240 05/21/22 1434   05/20/22 1600  cefTRIAXone (ROCEPHIN) 2 g in sodium chloride 0.9 % 100 mL IVPB  Status:  Discontinued        2 g 200 mL/hr over 30 Minutes Intravenous Every 24 hours 05/19/22 1841 05/22/22 1119   05/19/22 1630  cefTRIAXone (ROCEPHIN) 2 g  in sodium chloride 0.9 % 100 mL IVPB        2 g 200 mL/hr over 30 Minutes Intravenous  Once 05/19/22 1617 05/19/22 1753   05/19/22 1530  cefTRIAXone (ROCEPHIN) 1 g in sodium chloride 0.9 % 100 mL IVPB  Status:  Discontinued        1 g 200 mL/hr over 30 Minutes Intravenous  Once 05/19/22 1521 05/19/22 1617       Subjective: Seen and examined at bedside and she desaturated overnight.  Feels much better and closer to her baseline.  No nausea or vomiting.  Denies any lightheadedness or dizziness.  No other concerns or complaints at this time.  Objective: Vitals:   05/22/22 2041 05/23/22 0456 05/23/22 1358 05/23/22 1408  BP: (!) 169/56 (!) 142/52 (!) 147/61   Pulse: 86 79 86   Resp: 18  16   Temp: 98.4 F (36.9 C) 98.5 F (36.9 C) 98.2 F (36.8 C)   TempSrc: Oral Oral Oral   SpO2: 93% 96% (!) 87% 94%  Weight:      Height:        Intake/Output Summary (Last 24 hours) at 05/23/2022 1657 Last data filed at 05/23/2022 1402 Gross per 24 hour  Intake 138 ml  Output 1650 ml  Net -1512 ml   Filed Weights   05/19/22 1121 05/20/22 0500 05/21/22 1144   Weight: 52.2 kg 60.3 kg 58.4 kg   Examination: Physical Exam:  Constitutional: WN/WD elderly chronically ill-appearing Caucasian female in no acute distress Respiratory: Diminished to auscultation bilaterally with some coarse breath sounds and has some crackles and rhonchi noted.  No appreciable wheezing or rales.  Has a normal respiratory effort and unlabored breathing and wearing 2 L of supplemental oxygen nasal cannula.  Cardiovascular: RRR, no murmurs / rubs / gallops. S1 and S2 auscultated.  Trace extremity edema Abdomen: Soft, non-tender, non-distended.  Bowel sounds positive.  GU: Deferred. Musculoskeletal: No clubbing / cyanosis of digits/nails. No joint deformity upper and lower extremities.  Skin: No rashes, lesions, ulcers limited skin evaluation. No induration; Warm and dry.  Neurologic: CN 2-12 grossly intact with no focal deficits. Romberg sign and cerebellar reflexes not assessed.  Psychiatric: Normal judgment and insight. Alert and oriented x 3. Normal mood and appropriate affect.   Data Reviewed: I have personally reviewed following labs and imaging studies  CBC: Recent Labs  Lab 05/19/22 1127 05/20/22 0019 05/21/22 0306 05/22/22 0314 05/23/22 0551  WBC 19.0* 20.1* 19.5* 10.7* 8.8  NEUTROABS  --   --   --  9.0* 5.9  HGB 11.8* 9.9* 10.5* 11.0* 11.2*  HCT 34.6* 29.4* 31.9* 32.8* 34.5*  MCV 95.3 99.3 98.5 97.0 100.9*  PLT 202 150 156 168 [id number]*   Basic Metabolic Panel: Recent Labs  Lab 05/19/22 2233 05/20/22 0019 05/20/22 0739 05/20/22 1711 05/21/22 0300 05/21/22 0306 05/22/22 0314 05/23/22 0551  NA 134* 133*  --  134*  --  133* 133* 131*  K 3.3* 3.1*  --  3.8  --  3.7 3.8 3.3*  CL 107 107  --  102  --  104 98 93*  CO2 21* 20*  --  23  --  '22 26 27  '$ GLUCOSE 103* 101*  --  131*  --  121* 119* 92  BUN 34* 36*  --  31*  --  31* 27* 18  CREATININE 1.67* 1.59*  --  1.46*  --  1.34* 1.29* 1.13*  CALCIUM 6.8* 6.8*  --  7.1*  --  7.0* 7.7* 8.2*  MG 1.7  --   1.8  --  2.0  --  2.1 1.7  PHOS  --   --  3.5  --  1.9*  --  2.4* 3.5   GFR: Estimated Creatinine Clearance: 28.8 mL/min (A) (by C-G formula based on SCr of 1.13 mg/dL (H)). Liver Function Tests: Recent Labs  Lab 05/19/22 2233 05/20/22 0019 05/21/22 0300 05/22/22 0314 05/23/22 0551  AST 38 39 52* 92* 84*  ALT '22 23 26 '$ 49* 53*  ALKPHOS 109 71 69 90 86  BILITOT 0.5 0.6 0.5 0.5 0.5  PROT 5.0* 5.0* 5.6* 6.1* 6.1*  ALBUMIN 2.6* 2.6* 2.5* 2.7* 2.7*   No results for input(s): "LIPASE", "AMYLASE" in the last 168 hours. No results for input(s): "AMMONIA" in the last 168 hours. Coagulation Profile: No results for input(s): "INR", "PROTIME" in the last 168 hours. Cardiac Enzymes: Recent Labs  Lab 05/19/22 2233  CKTOTAL 118   BNP (last 3 results) No results for input(s): "PROBNP" in the last 8760 hours. HbA1C: No results for input(s): "HGBA1C" in the last 72 hours. CBG: No results for input(s): "GLUCAP" in the last 168 hours. Lipid Profile: No results for input(s): "CHOL", "HDL", "LDLCALC", "TRIG", "CHOLHDL", "LDLDIRECT" in the last 72 hours. Thyroid Function Tests: Recent Labs    05/21/22 0306  FREET4 1.12   Anemia Panel: Recent Labs    05/23/22 0551 05/23/22 0630  VITAMINB12 518  --   FOLATE 25.3  --   FERRITIN 315*  --   TIBC 157*  --   IRON 20*  --   RETICCTPCT  --  0.9   Sepsis Labs: Recent Labs  Lab 05/19/22 1127 05/19/22 1625 05/20/22 0019  LATICACIDVEN 1.6 1.6 1.8   Recent Results (from the past 240 hour(s))  Resp panel by RT-PCR (RSV, Flu A&B, Covid) Anterior Nasal Swab     Status: None   Collection Time: 05/19/22 10:07 AM   Specimen: Anterior Nasal Swab  Result Value Ref Range Status   SARS Coronavirus 2 by RT PCR NEGATIVE NEGATIVE Final    Comment: (NOTE) SARS-CoV-2 target nucleic acids are NOT DETECTED.  The SARS-CoV-2 RNA is generally detectable in upper respiratory specimens during the acute phase of infection. The lowest concentration of  SARS-CoV-2 viral copies this assay can detect is 138 copies/mL. A negative result does not preclude SARS-Cov-2 infection and should not be used as the sole basis for treatment or other patient management decisions. A negative result may occur with  improper specimen collection/handling, submission of specimen other than nasopharyngeal swab, presence of viral mutation(s) within the areas targeted by this assay, and inadequate number of viral copies(<138 copies/mL). A negative result must be combined with clinical observations, patient history, and epidemiological information. The expected result is Negative.  Fact Sheet for Patients:  EntrepreneurPulse.com.au  Fact Sheet for Healthcare Providers:  IncredibleEmployment.be  This test is no t yet approved or cleared by the [place] FDA and  has been authorized for detection and/or diagnosis of SARS-CoV-2 by FDA under an Emergency Use Authorization (EUA). This EUA will remain  in effect (meaning this test can be used) for the duration of the COVID-19 declaration under Section 564(b)(1) of the Act, 21 U.S.C.section 360bbb-3(b)(1), unless the authorization is terminated  or revoked sooner.       Influenza A by PCR NEGATIVE NEGATIVE Final   Influenza B by PCR NEGATIVE NEGATIVE Final    Comment: (NOTE) The Xpert Xpress SARS-CoV-2/FLU/RSV plus assay is intended  as an aid in the diagnosis of influenza from Nasopharyngeal swab specimens and should not be used as a sole basis for treatment. Nasal washings and aspirates are unacceptable for Xpert Xpress SARS-CoV-2/FLU/RSV testing.  Fact Sheet for Patients: EntrepreneurPulse.com.au  Fact Sheet for Healthcare Providers: IncredibleEmployment.be  This test is not yet approved or cleared by the [place] FDA and has been authorized for detection and/or diagnosis of SARS-CoV-2 by FDA under an Emergency Use  Authorization (EUA). This EUA will remain in effect (meaning this test can be used) for the duration of the COVID-19 declaration under Section 564(b)(1) of the Act, 21 U.S.C. section 360bbb-3(b)(1), unless the authorization is terminated or revoked.     Resp Syncytial Virus by PCR NEGATIVE NEGATIVE Final    Comment: (NOTE) Fact Sheet for Patients: EntrepreneurPulse.com.au  Fact Sheet for Healthcare Providers: IncredibleEmployment.be  This test is not yet approved or cleared by the [place] FDA and has been authorized for detection and/or diagnosis of SARS-CoV-2 by FDA under an Emergency Use Authorization (EUA). This EUA will remain in effect (meaning this test can be used) for the duration of the COVID-19 declaration under Section 564(b)(1) of the Act, 21 U.S.C. section 360bbb-3(b)(1), unless the authorization is terminated or revoked.  Performed at [facility], [place] [address] [place], [place] [address]   Blood culture (routine x 2)     Status: Abnormal   Collection Time: 05/19/22 11:17 AM   Specimen: Left Antecubital; Blood  Result Value Ref Range Status   Specimen Description   Final    LEFT ANTECUBITAL BLOOD Performed at [facility], [place] [address], [place], [place] [address]    Special Requests   Final    BOTTLES DRAWN AEROBIC AND ANAEROBIC Blood Culture results may not be optimal due to an excessive volume of blood received in culture bottles Performed at [place] [address] [place], [place] [address]    Culture  Setup Time   Final    GRAM NEGATIVE RODS IN BOTH AEROBIC AND ANAEROBIC BOTTLES CRITICAL VALUE NOTED.  VALUE IS CONSISTENT WITH PREVIOUSLY REPORTED AND CALLED VALUE.    Culture (A)  Final    ESCHERICHIA COLI SUSCEPTIBILITIES PERFORMED ON PREVIOUS CULTURE WITHIN THE LAST 5 DAYS. Performed at [facility], [place] [address] [place], [place] [address]     Report Status 05/22/2022 FINAL  Final  Blood culture (routine x 2)     Status: Abnormal   Collection Time: 05/19/22 11:25 AM   Specimen: Right Antecubital; Blood  Result Value Ref Range Status   Specimen Description   Final    RIGHT ANTECUBITAL BLOOD Performed at [facility], [place] [address] [place], [place] [address]    Special Requests   Final    BOTTLES DRAWN AEROBIC AND ANAEROBIC Blood Culture results may not be optimal due to an excessive volume of blood received in culture bottles Performed at [place] [address] [place], [place] [address]    Culture  Setup Time   Final    GRAM NEGATIVE RODS IN BOTH AEROBIC AND ANAEROBIC BOTTLES CRITICAL RESULT CALLED TO, READ BACK BY AND VERIFIED WITH: E JACKSON,PHARMD'@0320'$  05/20/22 [place] Performed at [facility], [address], [place],  [address]    Culture ESCHERICHIA COLI (A)  Final   Report Status 05/22/2022 FINAL  Final   Organism ID, Bacteria ESCHERICHIA COLI  Final      Susceptibility   Escherichia  coli - MIC*    AMPICILLIN 4 SENSITIVE Sensitive     CEFEPIME <=0.12 SENSITIVE Sensitive     CEFTAZIDIME <=1 SENSITIVE Sensitive     CEFTRIAXONE <=0.25 SENSITIVE Sensitive     CIPROFLOXACIN <=0.25 SENSITIVE Sensitive     GENTAMICIN <=1 SENSITIVE Sensitive     IMIPENEM <=0.25 SENSITIVE Sensitive     TRIMETH/SULFA <=20 SENSITIVE Sensitive     AMPICILLIN/SULBACTAM <=2 SENSITIVE Sensitive     PIP/TAZO <=4 SENSITIVE Sensitive     * ESCHERICHIA COLI  Blood Culture ID Panel (Reflexed)     Status: Abnormal   Collection Time: 05/19/22 11:25 AM  Result Value Ref Range Status   Enterococcus faecalis NOT DETECTED NOT DETECTED Final   Enterococcus Faecium NOT DETECTED NOT DETECTED Final   Listeria monocytogenes NOT DETECTED NOT DETECTED Final   Staphylococcus species NOT DETECTED NOT DETECTED Final   Staphylococcus aureus (BCID) NOT DETECTED NOT DETECTED Final   Staphylococcus epidermidis NOT  DETECTED NOT DETECTED Final   Staphylococcus lugdunensis NOT DETECTED NOT DETECTED Final   Streptococcus species NOT DETECTED NOT DETECTED Final   Streptococcus agalactiae NOT DETECTED NOT DETECTED Final   Streptococcus pneumoniae NOT DETECTED NOT DETECTED Final   Streptococcus pyogenes NOT DETECTED NOT DETECTED Final   A.calcoaceticus-baumannii NOT DETECTED NOT DETECTED Final   Bacteroides fragilis NOT DETECTED NOT DETECTED Final   Enterobacterales DETECTED (A) NOT DETECTED Final    Comment: Enterobacterales represent a large order of gram negative bacteria, not a single organism. CRITICAL RESULT CALLED TO, READ BACK BY AND VERIFIED WITH: E JACKSON,PHARMD'@0320'$  05/20/22 [place]    Enterobacter cloacae complex NOT DETECTED NOT DETECTED Final   Escherichia coli DETECTED (A) NOT DETECTED Final    Comment: CRITICAL RESULT CALLED TO, READ BACK BY AND VERIFIED WITH: E JACKSON,PHARMD'@0323'$  05/20/22 [place]    Klebsiella aerogenes NOT DETECTED NOT DETECTED Final   Klebsiella oxytoca NOT DETECTED NOT DETECTED Final   Klebsiella pneumoniae NOT DETECTED NOT DETECTED Final   Proteus species NOT DETECTED NOT DETECTED Final   Salmonella species NOT DETECTED NOT DETECTED Final   Serratia marcescens NOT DETECTED NOT DETECTED Final   Haemophilus influenzae NOT DETECTED NOT DETECTED Final   Neisseria meningitidis NOT DETECTED NOT DETECTED Final   Pseudomonas aeruginosa NOT DETECTED NOT DETECTED Final   Stenotrophomonas maltophilia NOT DETECTED NOT DETECTED Final   Candida albicans NOT DETECTED NOT DETECTED Final   Candida auris NOT DETECTED NOT DETECTED Final   Candida glabrata NOT DETECTED NOT DETECTED Final   Candida krusei NOT DETECTED NOT DETECTED Final   Candida parapsilosis NOT DETECTED NOT DETECTED Final   Candida tropicalis NOT DETECTED NOT DETECTED Final   Cryptococcus neoformans/gattii NOT DETECTED NOT DETECTED Final   CTX-M ESBL NOT DETECTED NOT DETECTED Final   Carbapenem resistance IMP NOT  DETECTED NOT DETECTED Final   Carbapenem resistance KPC NOT DETECTED NOT DETECTED Final   Carbapenem resistance NDM NOT DETECTED NOT DETECTED Final   Carbapenem resist OXA 48 LIKE NOT DETECTED NOT DETECTED Final   Carbapenem resistance VIM NOT DETECTED NOT DETECTED Final    Comment: Performed at [facility], [address], [place], [place] [address]  Urine Culture     Status: Abnormal   Collection Time: 05/19/22  4:40 PM   Specimen: Urine, Random  Result Value Ref Range Status   Specimen Description   Final    URINE, RANDOM Performed at [place] [address], [place], [place] [address]    Special Requests  Final    NONE Reflexed from [id number] Performed at [facility], [place] [address] [place], [place] [address]    Culture >=100,000 COLONIES/mL ESCHERICHIA COLI (A)  Final   Report Status 05/21/2022 FINAL  Final   Organism ID, Bacteria ESCHERICHIA COLI (A)  Final      Susceptibility   Escherichia coli - MIC*    AMPICILLIN 4 SENSITIVE Sensitive     CEFAZOLIN <=4 SENSITIVE Sensitive     CEFEPIME <=0.12 SENSITIVE Sensitive     CEFTRIAXONE <=0.25 SENSITIVE Sensitive     CIPROFLOXACIN <=0.25 SENSITIVE Sensitive     GENTAMICIN <=1 SENSITIVE Sensitive     IMIPENEM <=0.25 SENSITIVE Sensitive     NITROFURANTOIN <=16 SENSITIVE Sensitive     TRIMETH/SULFA <=20 SENSITIVE Sensitive     AMPICILLIN/SULBACTAM <=2 SENSITIVE Sensitive     PIP/TAZO <=4 SENSITIVE Sensitive     * >=100,000 COLONIES/mL ESCHERICHIA COLI  MRSA Next Gen by PCR, Nasal     Status: Abnormal   Collection Time: 05/19/22  8:13 PM   Specimen: Nasal Mucosa; Nasal Swab  Result Value Ref Range Status   MRSA by PCR Next Gen DETECTED (A) NOT DETECTED Final    Comment: (NOTE) The GeneXpert MRSA Assay (FDA approved for NASAL specimens only), is one component of a comprehensive MRSA colonization surveillance program. It is not intended to diagnose MRSA infection nor to guide or  monitor treatment for MRSA infections. Test performance is not FDA approved in patients less than 61 years old. Performed at [facility], [place] [address], [place], [place] [address]     Radiology Studies: DG CHEST PORT 1 VIEW  Result Date: 05/23/2022 CLINICAL DATA:  Shortness of breath EXAM: PORTABLE CHEST 1 VIEW COMPARISON:  Chest radiograph dated 05/22/2022 FINDINGS: Normal lung volumes. Hazy bibasilar opacities. Slightly increased small left pleural effusion. No pneumothorax. The heart size and mediastinal contours are within normal limits. The visualized skeletal structures are unremarkable. IMPRESSION: 1. Hazy bibasilar opacities, which may represent atelectasis, aspiration, or pneumonia. 2. Slightly increased small left pleural effusion. Electronically Signed   By: Darrin Nipper M.D.   On: 05/23/2022 08:15   DG CHEST PORT 1 VIEW  Result Date: 05/22/2022 CLINICAL DATA:  Shortness of breath EXAM: PORTABLE CHEST 1 VIEW COMPARISON:  Previous studies including the examination of 05/21/2022 FINDINGS: Cardiac size is within normal limits. There are no signs of pulmonary edema. There is faint haziness in the lower lung fields. Right lateral CP angle is indistinct. There is no pneumothorax. IMPRESSION: There are no signs of pulmonary edema. Faint haziness in the lower lung fields may be due to crowding of normal bronchovascular structures or minimal effusions or early infiltrates. Electronically Signed   By: Elmer Picker M.D.   On: 05/22/2022 08:13    Scheduled Meds:  amLODipine  10 mg Oral Daily   amoxicillin-clavulanate  1 tablet Oral Q12H   aspirin  81 mg Oral Daily   Chlorhexidine Gluconate Cloth  6 each Topical Daily   enoxaparin (LOVENOX) injection  30 mg Subcutaneous Q24H   feeding supplement  237 mL Oral TID BM   fluticasone  2 spray Each Nare Daily   guaiFENesin  600 mg Oral BID   ipratropium  0.5 mg Nebulization Q8H   levalbuterol  0.63 mg Nebulization Q8H    levothyroxine  125 mcg Oral Daily   melatonin  10 mg Oral QHS   multivitamin with minerals  1 tablet Oral Daily   mupirocin ointment  1 Application Nasal BID   potassium chloride  40 mEq Oral BID   primidone  200 mg Oral BID   propranolol  60 mg Oral Daily   sertraline  50 mg Oral Daily   Continuous Infusions:  sodium chloride Stopped (05/20/22 0948)    LOS: 4 days   Raiford Noble, DO [facility] Available via Epic secure chat 7am-7pm After these hours, please refer to coverage provider listed on [email or web address] 05/23/2022, 4:57 PM

## 2022-05-23 NOTE — Plan of Care (Signed)

## 2022-05-24 ENCOUNTER — Inpatient Hospital Stay (HOSPITAL_COMMUNITY): Payer: Medicare HMO

## 2022-05-24 DIAGNOSIS — E440 Moderate protein-calorie malnutrition: Secondary | ICD-10-CM | POA: Diagnosis not present

## 2022-05-24 DIAGNOSIS — N179 Acute kidney failure, unspecified: Secondary | ICD-10-CM | POA: Diagnosis not present

## 2022-05-24 DIAGNOSIS — E871 Hypo-osmolality and hyponatremia: Secondary | ICD-10-CM | POA: Diagnosis not present

## 2022-05-24 DIAGNOSIS — B9620 Unspecified Escherichia coli [E. coli] as the cause of diseases classified elsewhere: Secondary | ICD-10-CM

## 2022-05-24 DIAGNOSIS — A419 Sepsis, unspecified organism: Secondary | ICD-10-CM | POA: Diagnosis not present

## 2022-05-24 LAB — CBC
HCT: 31.7 % — ABNORMAL LOW (ref 36.0–46.0)
Hemoglobin: 10.7 g/dL — ABNORMAL LOW (ref 12.0–15.0)
MCH: 32.1 pg (ref 26.0–34.0)
MCHC: 33.8 g/dL (ref 30.0–36.0)
MCV: 95.2 fL (ref 80.0–100.0)
Platelets: 213 10*3/uL (ref 150–400)
RBC: 3.33 MIL/uL — ABNORMAL LOW (ref 3.87–5.11)
RDW: 13.5 % (ref 11.5–15.5)
WBC: 7.6 10*3/uL (ref 4.0–10.5)
nRBC: 0 % (ref 0.0–0.2)

## 2022-05-24 LAB — BASIC METABOLIC PANEL
Anion gap: 8 (ref 5–15)
BUN: 18 mg/dL (ref 8–23)
CO2: 28 mmol/L (ref 22–32)
Calcium: 8.3 mg/dL — ABNORMAL LOW (ref 8.9–10.3)
Chloride: 96 mmol/L — ABNORMAL LOW (ref 98–111)
Creatinine, Ser: 1.1 mg/dL — ABNORMAL HIGH (ref 0.44–1.00)
GFR, Estimated: 49 mL/min — ABNORMAL LOW (ref >60)
Glucose, Bld: 94 mg/dL (ref 70–99)
Potassium: 4 mmol/L (ref 3.5–5.1)
Sodium: 132 mmol/L — ABNORMAL LOW (ref 135–145)

## 2022-05-24 MED ORDER — ONDANSETRON HCL 4 MG PO TABS: 4.0000 mg | ORAL_TABLET | Freq: Four times a day (QID) | ORAL | 0 refills | Status: DC | PRN

## 2022-05-24 MED ORDER — MUPIROCIN 2 % EX OINT: 1.0000 | TOPICAL_OINTMENT | Freq: Two times a day (BID) | CUTANEOUS | 0 refills | Status: AC

## 2022-05-24 MED ORDER — ATORVASTATIN CALCIUM 40 MG PO TABS: 40.0000 mg | ORAL_TABLET | Freq: Every day | ORAL | Status: AC

## 2022-05-24 MED ORDER — ENSURE ENLIVE PO LIQD: 237.0000 mL | Freq: Three times a day (TID) | ORAL | 12 refills | Status: DC

## 2022-05-24 MED ORDER — WITCH HAZEL-GLYCERIN EX PADS: MEDICATED_PAD | CUTANEOUS | 12 refills | Status: DC | PRN

## 2022-05-24 MED ORDER — AMOXICILLIN-POT CLAVULANATE 500-125 MG PO TABS: 1.0000 | ORAL_TABLET | Freq: Two times a day (BID) | ORAL | 0 refills | Status: DC

## 2022-05-24 MED ORDER — GUAIFENESIN ER 600 MG PO TB12: 600.0000 mg | ORAL_TABLET | Freq: Two times a day (BID) | ORAL | 0 refills | Status: DC

## 2022-05-24 MED ORDER — SENNOSIDES-DOCUSATE SODIUM 8.6-50 MG PO TABS: 1.0000 | ORAL_TABLET | Freq: Every evening | ORAL | 0 refills | Status: DC | PRN

## 2022-05-24 MED ORDER — AMOXICILLIN-POT CLAVULANATE 500-125 MG PO TABS
1.0000 | ORAL_TABLET | Freq: Two times a day (BID) | ORAL | Status: DC
  Filled 2022-05-24: qty 1

## 2022-05-24 MED ORDER — CHLORTHALIDONE 15 MG PO TABS: 15.0000 mg | ORAL_TABLET | Freq: Every day | ORAL | 0 refills | Status: DC

## 2022-05-24 NOTE — Discharge Summary (Addendum)
Physician Discharge Summary   Patient: Brandy Hicks MRN: [id number] DOB: 1933-01-21  Admit date:     05/19/2022  Discharge date: 05/24/22  Discharge Physician: Raiford Noble, DO   PCP: Davina Poke, FNP   Recommendations at discharge:   Follow-up with PCP within 1 to 2 weeks and repeat CBC, CMP, mag, Phos within 1 week Follow-up with pulmonary in outpatient setting if necessary and repeat chest x-ray in 3 to 6 weeks Follow-up with neurology in outpatient setting Repeat thyroid function test in 4 to 6 weeks and adjust as necessary Continue antibiotics to completion  Discharge Diagnoses: Principal Problem:   Sepsis due to Escherichia coli (E. coli) ([facility]) Active Problems:   Hyponatremia   SIRS (systemic inflammatory response syndrome) (HCC)   Septic shock (HCC)   Malnutrition of moderate degree  Resolved Problems:   * No resolved hospital problems. *  Hospital Course: 89yo f w/ PH of HTN,essential tremor, hyperlipidemia, osteoporosis, hypothyroidism, Guillain-Barr syndrome about 5 years ago, CKD3b b/l creat of 1-1.2 presented to the ED with 3 days of Weakness  and shaky, worsening of baseline tremors. She c/o chills but no fever. Patient denies any nausea, vomiting, chest pain, shortness of breath,headache, focal weakness, numbness tingling, speech difficulties. She c/o food not being good at [employer] She was much worse today with difficulty standing walking getting up.  EMS was called noted fever of 102.  Patient denies any shortness of breath cough or respiratory symptoms sore throat nausea vomiting diarrhea.   In the ED, vital signs showed fever 103.1 respiration 24 BP 132/38 saturating 99% on room air, labs showed hyponatremia 129 hypokalemia 2.9 elevated BUN/creatinine 36/1.5 left acid 1.6 with leukocytosis 19 3 respiratory virus COVID/influenza RSV negative blood culture sent UA ordered pending, given a liter bolus, ceftriaxone, BP noted to be down 94/43> at this time  half liter bolus ordered, repeat lactic acid ordered> I&O ordered to get [place] sample.  She was given 40kcl po. Admission was requested for further management. On admission BP in 108/42.    Interim History -Patient had to be placed on short-term pressors but is now slowly improving.  Further workup reveals that she has an E. coli bacteremia as well as UTI and a concomitant strep pneumo pneumonia.   She is slowly improving and was weaned off oxygen yesterday but had to be placed on it back overnight and she desaturated on ambulation.  Will add some more breathing treatments and encourage pulmonary toileting and observe another night and anticipate discharging home in next 24 to 48 hours.  LFTs were mildly elevated sp RUQ ultrasound was done and showed no ductal dilatation and heterogeneous hepatic parenchyma.  She continues to improve but did desaturate again slightly on room air so she will require oxygen at discharge.  She is medically stable for discharge at this time will need follow-up with PCP as well as pulmonary in outpatient setting and repeat chest x-ray in 3 to 6 weeks and continue her current antibiotic course until completion.   Assessment and Plan:  Septic Shock due to E. coli bacteremia and UTI with concomitant Strep Pneumo PNA, improved -BP and lactic acid stable but she had to be temporarily placed on pressors and removed yesterday -placed on empiric ceftriaxone and will continue given that urinalysis is pansensitive and will change to p.o. Augmentin and dose was reduced given her creatinine clearance but will be discharged on 5 more days to have a total of 10-day treatment -was also  given additional IV fluids however overnight hypotensive needing additional IV fluid boluses and subsequently placed on peripheral Levophed, weaned off this the day before yesterday AM.   -WBC remains elevated, BC ID with E. coli follow-up blood/urine culture,  -BP on higher side.  Pulmonary critical team was  following.  Serum cortisol normal 30.1.   -Renal ultrasound no acute finding.  In primary pulmonary team IVC appears full so holding off further IV fluids.  She received IV Lasix 3 days in row but will hold again today and have her resume her Chlorthalidone but reduce the dose to 15 mg po Daily -WBC and LA trend: Recent Labs  Lab 05/19/22 1127 05/19/22 1625 05/20/22 0019 05/21/22 0306 05/22/22 0314 05/23/22 0551 05/24/22 0647  WBC 19.0*  --  20.1* 19.5* 10.7* 8.8 7.6  LATICACIDVEN 1.6   < > 1.8  --   --   --   --    < > = values in this interval not displayed.  -Strep pneumo urine antigen is positive -Chest x-ray the day before yesterday showed "Trace bilateral pleural effusions and bibasilar atelectasis" and CXR yesterday done and showed "There are no signs of pulmonary edema. Faint haziness in the lower lung fields may be due to crowding of normal bronchovascular structures or minimal effusions or early infiltrates." -Patient desaturated will continue another day of treatment and add Xopenex and Atrovent and may need some more additional diuresis in the morning but will repeat chest x-ray   Acute Respiratory Failure with Hypoxia -Increased interstitial markings bilaterally suspicious for interstitial edema and a/or atypical pneumonia,  subtle lucencies within the mediastinum-?Early pneumomediastinum -CT chest has been ordered defer to pulmonary-; CT Scan of the Chest done and showed "No pneumomediastinum identified by CT. Bilateral small pleural effusions identified with adjacent lung opacities. There is increasing tree-in-bud, reticulonodular changes in both lungs. In addition there is developing bronchial wall thickening along both lower lobes with some interstitial thickening. Please correlate for an acute process. Short-term follow up in 3 months may be of some benefit. Underlying centrilobular emphysematous changes. Extensive vascular calcifications identified including along the great  vessels with potential stenosis. If needed a follow up CT angiogram can be performed as clinically directed. Similar mildly enlarged and borderline multiple mediastinal lymph nodes going back to 2021. Diffuse anasarca and skin thickening. Aortic Atherosclerosis (ICD10-I70.0) and Emphysema"  -SpO2: 95 % O2 Flow Rate (L/min): 2 L/min FiO2 (%): 21 % -Continue supplemental oxygen.  Check procalcitonin if it is elevated will consider adding azithromycin or Doxy -Continuous pulse oximetry maintain O2 saturations greater than 90% -Continue supplemental oxygen via nasal cannula wean O2 as tolerated -She will need an ambulatory home O2 screen and a repeat chest x-ray prior to discharge; ambulatory home O2 screen done and she desaturated so we will continue another day and add back to Xopenex and Atrovent and repeat chest x-ray in the morning; -Repeat Ambulatory screen done and showed that she did desaturate and will need 2 L supplemental oxygen nasal cannula -Repeat chest x-ray prior to discharge done and showed "Clear lungs. Stable cardiac and mediastinal contours. No pleural effusion or pneumothorax. Visualized portions of the bones and upper abdomen are unremarkable."   Underlying emphysematous lung changes -Cont pulm support and continue supplemental oxygen outpatient setting and follow-up with PCP and pulmonary and repeat chest x-ray in 3 to 6 weeks   Hypertension  -Normally on amlodipine 10, Chlorthalidone, Losartan at Home -Held antihypertensives due to sepsis but since blood pressure trending  up may need to resume gradually.   -Will resume propranolol for now which is also for her ET  -Continue to Monitor BP per protocol  -Last BP reading was elevated at 140/61   HLD -Hold atorvastatin 40 mg p.o. daily because of abnormal LFTs and resume in 1 week   Abnormal LFTs -LFT Trend: Recent Labs  Lab 05/19/22 2233 05/20/22 0019 05/21/22 0300 05/22/22 0314 05/23/22 0551  AST 38 39 52* 92* 84*   ALT '22 23 26 '$ 49* 53*  BILITOT 0.5 0.6 0.5 0.5 0.5  ALKPHOS 109 71 69 90 86  -Obtain a right upper quadrant ultrasound prior to discharge and showed "Slightly heterogeneous hepatic parenchyma. Portal vein is patent on color Doppler imaging with normal direction of blood flow towards the liver. Other: Small right pleural effusion." -Repeat CMP within 1 week, hold Atorvastatin for 1 week, and if still elevated have PCP further investigate    Hypophosphatemia -Phos Level Trend: Recent Labs  Lab 05/20/22 0739 05/21/22 0300 05/22/22 0314 05/23/22 0551  PHOS 3.5 1.9* 2.4* 3.5  -Continue to Monitor and Replete as Necessary -Repeat Phos Level in the outpatient setting    GBS hx -Patient does have intact knee reflex no muscle weakness in proximal and distal muscle in leg or arms.   -Has generalized weakness.  If she has any change in reflex/or weakness or any concerning symptoms will need to involve neurology -Will need PT/OT to evaluate and Treat and recommending Home Health    Hypothyroidism -TSH low 0.18 .  Checkied free T4 and was 1.1 -Will cut down Synthroid dose-will need to recheck as OP and decrease TSH dose if still low in 4-6 weeks -C/w Levothyroxine 125 mcg po Daily    Essential tremor chronic -Continue Primadone 200 mg po BID and will resume her propranolol and will follow up with Neurology within 1-2 weeks   Strep Pneumo Urinary Ag -Suspected PNA -DG CXR as above -Abx as above changed to po Augmentin 500-125 mg BID x 5 days    Acute urinary retention, improved  -Continue bladder scanning and INO cath -If continues to retain will need a Foley catheter   Leukocytosis -WBC Trend: Recent Labs  Lab 05/19/22 1127 05/20/22 0019 05/21/22 0306 05/22/22 0314 05/23/22 0551 05/24/22 0647  WBC 19.0* 20.1* 19.5* 10.7* 8.8 7.6  -Continue to Monitor and Trend for S/Sx of Infection and C/w Abx as above -Repeat CBC within 1 week   Hyponatremia -Na+ Trend: Recent Labs  Lab  05/19/22 2233 05/20/22 0019 05/20/22 1711 05/21/22 0306 05/22/22 0314 05/23/22 0551 05/24/22 0647  NA 134* 133* 134* 133* 133* 131* 132*  -Continue to Monitor and Trend -Repeat CMP within 1 week   Hypokalemia -Patient's K+ Level Trend: Recent Labs  Lab 05/19/22 2233 05/20/22 0019 05/20/22 1711 05/21/22 0306 05/22/22 0314 05/23/22 0551 05/24/22 0647  K 3.3* 3.1* 3.8 3.7 3.8 3.3* 4.0  -Continue to Monitor and Replete as Necessary -Repeat CMP within 1 week    Normocytic Anemia -Hgb/Hct Trend: Recent Labs  Lab 05/19/22 1127 05/20/22 0019 05/21/22 0306 05/22/22 0314 05/23/22 0551 05/24/22 0647  HGB 11.8* 9.9* 10.5* 11.0* 11.2* 10.7*  HCT 34.6* 29.4* 31.9* 32.8* 34.5* 31.7*  MCV 95.3 99.3 98.5 97.0 100.9* 95.2  -Checked Anemia Panel and showed an iron level of 20, UIBC 137, TIBC 157, saturation ratios of 13%, ferritin low at 315, folate level 25.3, vitamin B12 518 -Continue to Monitor for S/Sx of bleeding; no overt bleeding noted -Repeat CBC within 1  week   AKI on CKD Stage 3b, improved  -Baseline Creatinine ranging from 1-1.2 -BUN/Cr Trend: Recent Labs  Lab 05/19/22 2233 05/20/22 0019 05/20/22 1711 05/21/22 0306 05/22/22 0314 05/23/22 0551 05/24/22 0647  BUN 34* 36* 31* 31* 27* 18 18  CREATININE 1.67* 1.59* 1.46* 1.34* 1.29* 1.13* 1.10*  -Avoid Nephrotoxic Medications, Contrast Dyes, Hypotension and Dehydration to Ensure Adequate Renal Perfusion and will need to Renally Adjust Meds -Resume home chlorthalidone but reduce the dose to 15 mg daily -Continue to Monitor and Trend Renal Function carefully and repeat CMP in the AM    Hyperglycemia -Blood sugars ranging from 92-131 on daily BMP/CMP -Continue to to monitor and trend and place on sensitive NovoLog sliding scale insulin if necessary   Hypoalbuminemia -Patient's Albumin Trend: Recent Labs  Lab 05/19/22 2233 05/20/22 0019 05/21/22 0300 05/22/22 0314 05/23/22 0551  ALBUMIN 2.6* 2.6* 2.5* 2.7*  2.7*  -Continue to Monitor and Trend and repeat CMP in the AM   Goals of care  -Confirmed DNR status   Moderate Malnutrition in the Context of Chronic Illness Nutrition Documentation    [facility] ED to Hosp-Admission (Current) from 05/19/2022 in [facility]  Nutrition Problem Moderate Malnutrition  Etiology chronic illness  Nutrition Goal Patient will meet greater than or equal to 90% of their needs  Interventions Refer to RD note for recommendations, Ensure Enlive (each supplement provides 350kcal and 20 grams of protein), MVI      Consultants: PCCM Transfer Procedures performed: As delineated as above  Disposition: Home health Diet recommendation:  Discharge Diet Orders (From admission, onward)     Start     Ordered   05/24/22 0000  Diet - low sodium heart healthy        05/24/22 1329           Cardiac Diet DISCHARGE MEDICATION: Allergies as of 05/24/2022       Reactions   Hydroquinone    Pinkness and edema of face and eyelids, severe        Medication List     STOP taking these medications    acetaminophen 500 MG tablet Commonly known as: TYLENOL       TAKE these medications    amLODipine 10 MG tablet Commonly known as: NORVASC Take 10 mg by mouth daily.   amoxicillin-clavulanate 500-125 MG tablet Commonly known as: AUGMENTIN Take 1 tablet by mouth every 12 (twelve) hours for 5 days.   aspirin 81 MG tablet Take 81 mg by mouth daily.   atorvastatin 40 MG tablet Commonly known as: LIPITOR Take 1 tablet (40 mg total) by mouth daily. Start taking on: May 30, 2022 What changed: These instructions start on May 30, 2022. If you are unsure what to do until then, ask your doctor or other care provider.   chlorthalidone 15 MG tablet Commonly known as: HYGROTEN Take 1 tablet (15 mg total) by mouth daily. What changed:  medication strength how much to take   feeding supplement Liqd Take 237 mLs by mouth  3 (three) times daily between meals.   fluticasone 50 MCG/ACT nasal spray Commonly known as: FLONASE Place 2 sprays into both nostrils daily.   guaiFENesin 600 MG 12 hr tablet Commonly known as: MUCINEX Take 1 tablet (600 mg total) by mouth 2 (two) times daily for 5 days.   levothyroxine 150 MCG tablet Commonly known as: SYNTHROID Take 150 mcg by mouth daily.   losartan 100 MG tablet Commonly known  as: COZAAR Take 100 mg by mouth daily.   Melatonin 10 MG Tabs Take 10 mg by mouth at bedtime.   MULTIVITAMIN ADULT PO Take 1 tablet by mouth daily.   mupirocin ointment 2 % Commonly known as: BACTROBAN Place 1 Application into the nose 2 (two) times daily for 2 days.   ondansetron 4 MG tablet Commonly known as: ZOFRAN Take 1 tablet (4 mg total) by mouth every 6 (six) hours as needed for nausea.   primidone 50 MG tablet Commonly known as: MYSOLINE Take 200 mg by mouth 2 (two) times daily.   propranolol 60 MG tablet Commonly known as: INDERAL Take 60 mg by mouth in the morning.   senna-docusate 8.6-50 MG tablet Commonly known as: Senokot-S Take 1 tablet by mouth at bedtime as needed for mild constipation.   sertraline 50 MG tablet Commonly known as: ZOLOFT Take 50 mg by mouth daily.   Vitamin D 125 MCG (5000 UT) Caps Take 5,000 Units by mouth daily.   witch hazel-glycerin pad Commonly known as: TUCKS Apply topically as needed for itching.               Durable Medical Equipment  (From admission, onward)           Start     Ordered   05/24/22 1321  For home use only DME oxygen  Once       Question Answer Comment  Length of Need 6 Months   Liters per Minute 2   Frequency Continuous (stationary and portable oxygen unit needed)   Oxygen delivery system Gas      05/24/22 1320            Follow-up [facility] Follow up.   Why: [facility] [place], [phone] Contact information: [address] [place]  [address] [facility].   Contact information: [place] [address] [phone]                Discharge Exam: Filed Weights   05/19/22 1121 05/20/22 0500 05/21/22 1144  Weight: 52.2 kg 60.3 kg 58.4 kg   Vitals:   05/24/22 0759 05/24/22 1256  BP:  (!) 140/61  Pulse:  (!) 58  Resp:  18  Temp:  98.1 F (36.7 C)  SpO2: 92% 95%   Examination: Physical Exam:  Constitutional: WN/WD elderly Caucasian female in no acute distress appears calm Respiratory: Diminished to auscultation bilaterally with some coarse breath sounds and some slight crackles. no wheezing, rales, rhonchi. Normal respiratory effort and patient is not tachypenic. No accessory muscle use.  Wearing supplemental oxygen via nasal cannula Cardiovascular: RRR, no murmurs / rubs / gallops. S1 and S2 auscultated.  Trace extremity edema Abdomen: Soft, non-tender, slightly distended. Bowel sounds positive.  GU: Deferred. Musculoskeletal: No clubbing / cyanosis of digits/nails. No joint deformity upper and lower extremities.  Skin: No rashes, lesions, ulcers limited skin evaluation. No induration; Warm and dry.  Neurologic: CN 2-12 grossly intact with no focal deficits but does have essential tremors noted. Romberg sign and cerebellar reflexes not assessed.  Psychiatric: Normal judgment and insight. Alert and oriented x 3. Normal mood and appropriate affect.   Condition at discharge: stable  The results of significant diagnostics from this hospitalization (including imaging, microbiology, ancillary and laboratory) are listed below for reference.   Imaging Studies: DG CHEST PORT 1 VIEW  Result Date:  05/24/2022 CLINICAL DATA:  Shortness of breath. EXAM: PORTABLE CHEST 1 VIEW COMPARISON:  05/23/2022. FINDINGS: Clear lungs. Stable cardiac and mediastinal contours. No pleural effusion or pneumothorax. Visualized portions of the bones and upper abdomen are  unremarkable. IMPRESSION: No evidence of acute cardiopulmonary disease. Electronically Signed   By: Emmit Alexanders M.D.   On: 05/24/2022 09:18   US Abdomen Limited RUQ (LIVER/GB)  Result Date: 05/23/2022 CLINICAL DATA:  Elevated liver function tests EXAM: ULTRASOUND ABDOMEN LIMITED RIGHT UPPER QUADRANT COMPARISON:  Ultrasound 05/20/2022 kidneys.  Abdominal 01/02/2020 FINDINGS: Gallbladder: Previous cholecystectomy. Common bile duct: Diameter: 3 mm Liver: Slightly heterogeneous hepatic parenchyma. Portal vein is patent on color Doppler imaging with normal direction of blood flow towards the liver. Other: Small right pleural effusion. IMPRESSION: Previous cholecystectomy.  No ductal dilatation. Heterogeneous hepatic parenchyma. Electronically Signed   By: Jill Side M.D.   On: 05/23/2022 17:33   DG CHEST PORT 1 VIEW  Result Date: 05/23/2022 CLINICAL DATA:  Shortness of breath EXAM: PORTABLE CHEST 1 VIEW COMPARISON:  Chest radiograph dated 05/22/2022 FINDINGS: Normal lung volumes. Hazy bibasilar opacities. Slightly increased small left pleural effusion. No pneumothorax. The heart size and mediastinal contours are within normal limits. The visualized skeletal structures are unremarkable. IMPRESSION: 1. Hazy bibasilar opacities, which may represent atelectasis, aspiration, or pneumonia. 2. Slightly increased small left pleural effusion. Electronically Signed   By: Darrin Nipper M.D.   On: 05/23/2022 08:15   DG CHEST PORT 1 VIEW  Result Date: 05/22/2022 CLINICAL DATA:  Shortness of breath EXAM: PORTABLE CHEST 1 VIEW COMPARISON:  Previous studies including the examination of 05/21/2022 FINDINGS: Cardiac size is within normal limits. There are no signs of pulmonary edema. There is faint haziness in the lower lung fields. Right lateral CP angle is indistinct. There is no pneumothorax. IMPRESSION: There are no signs of pulmonary edema. Faint haziness in the lower lung fields may be due to crowding of normal  bronchovascular structures or minimal effusions or early infiltrates. Electronically Signed   By: Elmer Picker M.D.   On: 05/22/2022 08:13   DG CHEST PORT 1 VIEW  Result Date: 05/21/2022 CLINICAL DATA:  Shortness of breath EXAM: PORTABLE CHEST 1 VIEW COMPARISON:  Chest x-ray dated May 20, 2022 FINDINGS: Cardiac and mediastinal contours are unchanged. Trace bilateral pleural effusions and bibasilar atelectasis. No evidence of pneumothorax. IMPRESSION: Trace bilateral pleural effusions and bibasilar atelectasis. Electronically Signed   By: Yetta Glassman M.D.   On: 05/21/2022 13:21   ECHOCARDIOGRAM COMPLETE  Result Date: 05/20/2022    ECHOCARDIOGRAM REPORT   Patient Name:   LACOURTNEY NORRICK DER Hicks Date of Exam: 05/20/2022 Medical Rec #:  [id number]               Height:       61.0 in Accession #:    [id number]              Weight:       132.9 lb Date of Birth:  1933-03-13                BSA:          1.588 m Patient Age:    32 years                BP:           140/57 mmHg Patient Gender: F  HR:           88 bpm. Exam Location:  Inpatient Procedure: 2D Echo Indications:    bacteremia  History:        Patient has no prior history of Echocardiogram examinations.                 Risk Factors:Hypertension and Dyslipidemia.  Sonographer:    Harvie Junior Referring Phys: [id number] [place] MARSHALL  Sonographer Comments: Echo performed with patient supine and on artificial respirator and Technically difficult study due to poor echo windows. IMPRESSIONS  1. Left ventricular ejection fraction, by estimation, is 60 to 65%. The left ventricle has normal function. The left ventricle has no regional wall motion abnormalities. Left ventricular diastolic parameters were normal.  2. Right ventricular systolic function is normal. The right ventricular size is normal. There is normal pulmonary artery systolic pressure. The estimated right ventricular systolic pressure is [id number] mmHg.  3. The mitral valve is  degenerative. Trivial mitral valve regurgitation. No evidence of mitral stenosis.  4. The aortic valve is calcified. There is moderate calcification of the aortic valve. There is mild thickening of the aortic valve. Aortic valve regurgitation is not visualized. Aortic valve sclerosis is present, with no evidence of aortic valve stenosis.  5. The inferior vena cava is normal in size with greater than 50% respiratory variability, suggesting right atrial pressure of 3 mmHg. Conclusion(s)/Recommendation(s): No evidence of valvular vegetations on this transthoracic echocardiogram. Consider a transesophageal echocardiogram to exclude infective endocarditis if clinically indicated. FINDINGS  Left Ventricle: Left ventricular ejection fraction, by estimation, is 60 to 65%. The left ventricle has normal function. The left ventricle has no regional wall motion abnormalities. The left ventricular internal cavity size was normal in size. There is  no left ventricular hypertrophy. Left ventricular diastolic parameters were normal. Right Ventricle: The right ventricular size is normal. No increase in right ventricular wall thickness. Right ventricular systolic function is normal. There is normal pulmonary artery systolic pressure. The tricuspid regurgitant velocity is 2.50 m/s, and  with an assumed right atrial pressure of 3 mmHg, the estimated right ventricular systolic pressure is Q000111Q mmHg. Left Atrium: Left atrial size was normal in size. Right Atrium: Right atrial size was normal in size. Pericardium: There is no evidence of pericardial effusion. Mitral Valve: The mitral valve is degenerative in appearance. Mild to moderate mitral annular calcification. Trivial mitral valve regurgitation. No evidence of mitral valve stenosis. Tricuspid Valve: The tricuspid valve is grossly normal. Tricuspid valve regurgitation is trivial. No evidence of tricuspid stenosis. Aortic Valve: The aortic valve is calcified. There is moderate  calcification of the aortic valve. There is mild thickening of the aortic valve. Aortic valve regurgitation is not visualized. Aortic valve sclerosis is present, with no evidence of aortic valve stenosis. Aortic valve mean gradient measures 7.5 mmHg. Aortic valve peak gradient measures 13.2 mmHg. Aortic valve area, by VTI measures 1.72 cm. Pulmonic Valve: The pulmonic valve was grossly normal. Pulmonic valve regurgitation is not visualized. No evidence of pulmonic stenosis. Aorta: The aortic root is normal in size and structure. Venous: The inferior vena cava is normal in size with greater than 50% respiratory variability, suggesting right atrial pressure of 3 mmHg. IAS/Shunts: The atrial septum is grossly normal.  LEFT VENTRICLE PLAX 2D LVIDd:         3.30 cm     Diastology LVIDs:         1.90 cm     LV e' medial:  8.59 cm/s LV PW:         0.90 cm     LV E/e' medial:  15.4 LV IVS:        0.90 cm     LV e' lateral:   12.10 cm/s LVOT diam:     1.90 cm     LV E/e' lateral: 10.9 LV SV:         53 LV SV Index:   33 LVOT Area:     2.84 cm                             3D Volume EF: LV Volumes (MOD)           3D EF:        69 % LV vol d, MOD A2C: 77.5 ml LV EDV:       101 ml LV vol d, MOD A4C: 60.6 ml LV ESV:       31 ml LV vol s, MOD A2C: 23.5 ml LV SV:        69 ml LV vol s, MOD A4C: 21.3 ml LV SV MOD A2C:     54.0 ml LV SV MOD A4C:     60.6 ml LV SV MOD BP:      49.1 ml RIGHT VENTRICLE RV Basal diam:  2.90 cm RV Mid diam:    2.20 cm RV S prime:     17.40 cm/s TAPSE (M-mode): 2.0 cm LEFT ATRIUM             Index        RIGHT ATRIUM           Index LA diam:        3.10 cm 1.95 cm/m   RA Area:     10.60 cm LA Vol (A2C):   31.1 ml 19.59 ml/m  RA Volume:   24.30 ml  15.30 ml/m LA Vol (A4C):   47.5 ml 29.92 ml/m LA Biplane Vol: 39.7 ml 25.00 ml/m  AORTIC VALVE                     PULMONIC VALVE AV Area (Vmax):    1.75 cm      PV Vmax:       1.27 m/s AV Area (Vmean):   1.64 cm      PV Peak grad:  6.5 mmHg AV Area  (VTI):     1.72 cm AV Vmax:           182.00 cm/s AV Vmean:          126.000 cm/s AV VTI:            0.306 m AV Peak Grad:      13.2 mmHg AV Mean Grad:      7.5 mmHg LVOT Vmax:         112.50 cm/s LVOT Vmean:        72.850 cm/s LVOT VTI:          0.186 m LVOT/AV VTI ratio: 0.61  AORTA Ao Root diam: 3.10 cm MITRAL VALVE                TRICUSPID VALVE MV Area (PHT): 4.39 cm     TR Peak grad:   25.0 mmHg MV Decel Time: 173 msec     TR Vmax:        250.00 cm/s MR Peak grad: 70.4 mmHg MR Vmax:  419.50 cm/s   SHUNTS MV E velocity: 132.00 cm/s  Systemic VTI:  0.19 m MV A velocity: 130.00 cm/s  Systemic Diam: 1.90 cm MV E/A ratio:  1.02 Eleonore Chiquito MD Electronically signed by Eleonore Chiquito MD Signature Date/Time: 05/20/2022/6:59:43 PM    Final    CT CHEST WO CONTRAST  Result Date: 05/20/2022 CLINICAL DATA:  Hypoxia.  Abnormal chest x-ray EXAM: CT CHEST WITHOUT CONTRAST TECHNIQUE: Multidetector CT imaging of the chest was performed following the standard protocol without IV contrast. RADIATION DOSE REDUCTION: This exam was performed according to the departmental dose-optimization program which includes automated exposure control, adjustment of the mA and/or kV according to patient size and/or use of iterative reconstruction technique. COMPARISON:  X-ray earlier 05/20/2022.  Prior CT scan 01/02/2020 FINDINGS: Cardiovascular: On this non IV contrast exam heart is nonenlarged. Significant mitral valve annular calcification. No pericardial effusion. Extensive calcifications along the thoracic aorta, great vessels and coronary arteries. Please correlate for any known significant stenosis of the great vessels. There is suggestion of such involving the right subclavian artery, the brachiocephalic artery and the left common carotid. Additional contrast study as clinically directed. Mediastinum/Nodes: No pneumomediastinum identified by CT scan. No specific abnormal lymph node enlargement identified on this noncontrast  examination in the axillary region or hilum. There are some prominent nodes to the left of the aortic arch today. On image 47 of series 2 node measures 10 x 26 mm. Previously in October 2021 9 x 25 mm, not significantly changed. Other prominent mediastinal nodes also appear similar to that examination. Small hiatal hernia. Small thyroid gland. Lungs/Pleura: Small bilateral pleural effusions are identified, new from previous CT scan. Diffuse centrilobular emphysematous lung changes are identified as well. Both lower lobes show areas of interstitial thickening. There are bronchial wall thickening identified as well. Adjacent lung base opacities. Atelectasis favored over infiltrate. There is also reticulonodular changes in the middle lobe, lingula and inferior aspect of the upper lobes with tree-in-bud like areas these are increased from the study of 2021. No pneumothorax. Small right pleural calcification at the right lung base is stable. Upper Abdomen: Along the upper abdomen the adrenal glands are stable. Left adrenal gland is slightly thickened. Musculoskeletal: Diffuse anasarca some skin thickening, nonspecific. Scattered degenerative changes along the spine. IMPRESSION: No pneumomediastinum identified by CT. Bilateral small pleural effusions identified with adjacent lung opacities. There is increasing tree-in-bud, reticulonodular changes in both lungs. In addition there is developing bronchial wall thickening along both lower lobes with some interstitial thickening. Please correlate for an acute process. Short-term follow up in 3 months may be of some benefit. Underlying centrilobular emphysematous changes. Extensive vascular calcifications identified including along the great vessels with potential stenosis. If needed a follow up CT angiogram can be performed as clinically directed. Similar mildly enlarged and borderline multiple mediastinal lymph nodes going back to 2021. Diffuse anasarca and skin thickening.  Aortic Atherosclerosis (ICD10-I70.0) and Emphysema (ICD10-J43.9). Electronically Signed   By: Jill Side M.D.   On: 05/20/2022 12:52   US RENAL  Result Date: 05/20/2022 CLINICAL DATA:  [id number] AKI (acute kidney injury) ([place]) [id number] EXAM: RENAL / URINARY TRACT ULTRASOUND COMPLETE COMPARISON:  None Available. FINDINGS: The right kidney measured 11.5 cm and the left kidney measured 10.0 cm. The kidneys demonstrate increased echogenicity consistent with chronic medical renal disease. Right kidney midpole cyst measures 1.9 cm. This does not need to be evaluated further. No shadowing stones are seen. No hydronephrosis identified. The urinary bladder appeared unremarkable. Bladder volumes  were not measured. IMPRESSION: 1. Echogenic kidneys consistent with chronic medical renal disease. 2. Right kidney cyst. Electronically Signed   By: Sammie Bench M.D.   On: 05/20/2022 08:16   DG CHEST PORT 1 VIEW  Result Date: 05/20/2022 CLINICAL DATA:  Hypoxia EXAM: PORTABLE CHEST 1 VIEW COMPARISON:  Chest x-rays dated 05/19/2022 and 01/24/2020. FINDINGS: New subtle lucencies within the mediastinum, suspicious for early pneumomediastinum. Increased interstitial markings bilaterally. No confluent opacity is seen to suggest a consolidating pneumonia. No pleural effusion or pneumothorax is seen. IMPRESSION: 1. New subtle lucencies within the mediastinum, suspicious for early pneumomediastinum. Recommend chest CT further characterization. 2. Increased interstitial markings bilaterally, suspicious for interstitial edema and/or atypical pneumonia superimposed on chronic interstitial lung disease. These results will be called to the ordering clinician or representative by the Radiologist Assistant, and communication documented in the PACS or [employer]. Electronically Signed   By: Franki [place]ot M.D.   On: 05/20/2022 08:12   DG Chest 2 View  Result Date: 05/19/2022 CLINICAL DATA:  Fever EXAM: CHEST - 2 VIEW COMPARISON:   01/24/2020 FINDINGS: The heart size and mediastinal contours are within normal limits. Aortic atherosclerosis. Both lungs are clear. The visualized skeletal structures are unremarkable. IMPRESSION: No active cardiopulmonary disease. Electronically Signed   By: Davina Poke D.O.   On: 05/19/2022 10:19    Microbiology: Results for orders placed or performed during the hospital encounter of 05/19/22  Resp panel by RT-PCR (RSV, Flu A&B, Covid) Anterior Nasal Swab     Status: None   Collection Time: 05/19/22 10:07 AM   Specimen: Anterior Nasal Swab  Result Value Ref Range Status   SARS Coronavirus 2 by RT PCR NEGATIVE NEGATIVE Final    Comment: (NOTE) SARS-CoV-2 target nucleic acids are NOT DETECTED.  The SARS-CoV-2 RNA is generally detectable in upper respiratory specimens during the acute phase of infection. The lowest concentration of SARS-CoV-2 viral copies this assay can detect is 138 copies/mL. A negative result does not preclude SARS-Cov-2 infection and should not be used as the sole basis for treatment or other patient management decisions. A negative result may occur with  improper specimen collection/handling, submission of specimen other than nasopharyngeal swab, presence of viral mutation(s) within the areas targeted by this assay, and inadequate number of viral copies(<138 copies/mL). A negative result must be combined with clinical observations, patient history, and epidemiological information. The expected result is Negative.  Fact Sheet for Patients:  EntrepreneurPulse.com.au  Fact Sheet for Healthcare Providers:  IncredibleEmployment.be  This test is no t yet approved or cleared by the [place] FDA and  has been authorized for detection and/or diagnosis of SARS-CoV-2 by FDA under an Emergency Use Authorization (EUA). This EUA will remain  in effect (meaning this test can be used) for the duration of the COVID-19 declaration  under Section 564(b)(1) of the Act, 21 U.S.C.section 360bbb-3(b)(1), unless the authorization is terminated  or revoked sooner.       Influenza A by PCR NEGATIVE NEGATIVE Final   Influenza B by PCR NEGATIVE NEGATIVE Final    Comment: (NOTE) The Xpert Xpress SARS-CoV-2/FLU/RSV plus assay is intended as an aid in the diagnosis of influenza from Nasopharyngeal swab specimens and should not be used as a sole basis for treatment. Nasal washings and aspirates are unacceptable for Xpert Xpress SARS-CoV-2/FLU/RSV testing.  Fact Sheet for Patients: EntrepreneurPulse.com.au  Fact Sheet for Healthcare Providers: IncredibleEmployment.be  This test is not yet approved or cleared by the [place] FDA and has  been authorized for detection and/or diagnosis of SARS-CoV-2 by FDA under an Emergency Use Authorization (EUA). This EUA will remain in effect (meaning this test can be used) for the duration of the COVID-19 declaration under Section 564(b)(1) of the Act, 21 U.S.C. section 360bbb-3(b)(1), unless the authorization is terminated or revoked.     Resp Syncytial Virus by PCR NEGATIVE NEGATIVE Final    Comment: (NOTE) Fact Sheet for Patients: EntrepreneurPulse.com.au  Fact Sheet for Healthcare Providers: IncredibleEmployment.be  This test is not yet approved or cleared by the [place] FDA and has been authorized for detection and/or diagnosis of SARS-CoV-2 by FDA under an Emergency Use Authorization (EUA). This EUA will remain in effect (meaning this test can be used) for the duration of the COVID-19 declaration under Section 564(b)(1) of the Act, 21 U.S.C. section 360bbb-3(b)(1), unless the authorization is terminated or revoked.  Performed at [facility], [place] [address], [place], [place] [address]   Blood culture (routine x 2)     Status: Abnormal   Collection Time: 05/19/22 11:17 AM    Specimen: Left Antecubital; Blood  Result Value Ref Range Status   Specimen Description   Final    LEFT ANTECUBITAL BLOOD Performed at [facility], [place] [address] [place], [place] [address]    Special Requests   Final    BOTTLES DRAWN AEROBIC AND ANAEROBIC Blood Culture results may not be optimal due to an excessive volume of blood received in culture bottles Performed at [place] [address], [place], [place] [address]    Culture  Setup Time   Final    GRAM NEGATIVE RODS IN BOTH AEROBIC AND ANAEROBIC BOTTLES CRITICAL VALUE NOTED.  VALUE IS CONSISTENT WITH PREVIOUSLY REPORTED AND CALLED VALUE.    Culture (A)  Final    ESCHERICHIA COLI SUSCEPTIBILITIES PERFORMED ON PREVIOUS CULTURE WITHIN THE LAST 5 DAYS. Performed at [facility], [place] [address], [place], [place] [address]    Report Status 05/22/2022 FINAL  Final  Blood culture (routine x 2)     Status: Abnormal   Collection Time: 05/19/22 11:25 AM   Specimen: Right Antecubital; Blood  Result Value Ref Range Status   Specimen Description   Final    RIGHT ANTECUBITAL BLOOD Performed at [facility], [place] [address], [place],  [address]    Special Requests   Final    BOTTLES DRAWN AEROBIC AND ANAEROBIC Blood Culture results may not be optimal due to an excessive volume of blood received in culture bottles Performed at [place] [address], [place], [place] [address]    Culture  Setup Time   Final    GRAM NEGATIVE RODS IN BOTH AEROBIC AND ANAEROBIC BOTTLES CRITICAL RESULT CALLED TO, READ BACK BY AND VERIFIED WITH: E JACKSON,PHARMD'@0320'$  05/20/22 [place] Performed at [facility], [address] [place], [place] [address]    Culture ESCHERICHIA COLI (A)  Final   Report Status 05/22/2022 FINAL  Final   Organism ID, Bacteria ESCHERICHIA COLI  Final      Susceptibility   Escherichia coli - MIC*    AMPICILLIN 4 SENSITIVE Sensitive     CEFEPIME  <=0.12 SENSITIVE Sensitive     CEFTAZIDIME <=1 SENSITIVE Sensitive     CEFTRIAXONE <=0.25 SENSITIVE Sensitive     CIPROFLOXACIN <=0.25 SENSITIVE Sensitive     GENTAMICIN <=1 SENSITIVE Sensitive     IMIPENEM <=0.25 SENSITIVE Sensitive     TRIMETH/SULFA <=  20 SENSITIVE Sensitive     AMPICILLIN/SULBACTAM <=2 SENSITIVE Sensitive     PIP/TAZO <=4 SENSITIVE Sensitive     * ESCHERICHIA COLI  Blood Culture ID Panel (Reflexed)     Status: Abnormal   Collection Time: 05/19/22 11:25 AM  Result Value Ref Range Status   Enterococcus faecalis NOT DETECTED NOT DETECTED Final   Enterococcus Faecium NOT DETECTED NOT DETECTED Final   Listeria monocytogenes NOT DETECTED NOT DETECTED Final   Staphylococcus species NOT DETECTED NOT DETECTED Final   Staphylococcus aureus (BCID) NOT DETECTED NOT DETECTED Final   Staphylococcus epidermidis NOT DETECTED NOT DETECTED Final   Staphylococcus lugdunensis NOT DETECTED NOT DETECTED Final   Streptococcus species NOT DETECTED NOT DETECTED Final   Streptococcus agalactiae NOT DETECTED NOT DETECTED Final   Streptococcus pneumoniae NOT DETECTED NOT DETECTED Final   Streptococcus pyogenes NOT DETECTED NOT DETECTED Final   A.calcoaceticus-baumannii NOT DETECTED NOT DETECTED Final   Bacteroides fragilis NOT DETECTED NOT DETECTED Final   Enterobacterales DETECTED (A) NOT DETECTED Final    Comment: Enterobacterales represent a large order of gram negative bacteria, not a single organism. CRITICAL RESULT CALLED TO, READ BACK BY AND VERIFIED WITH: E JACKSON,PHARMD'@0320'$  05/20/22 [place]    Enterobacter cloacae complex NOT DETECTED NOT DETECTED Final   Escherichia coli DETECTED (A) NOT DETECTED Final    Comment: CRITICAL RESULT CALLED TO, READ BACK BY AND VERIFIED WITH: E JACKSON,PHARMD'@0323'$  05/20/22 [place]    Klebsiella aerogenes NOT DETECTED NOT DETECTED Final   Klebsiella oxytoca NOT DETECTED NOT DETECTED Final   Klebsiella pneumoniae NOT DETECTED NOT DETECTED Final   Proteus  species NOT DETECTED NOT DETECTED Final   Salmonella species NOT DETECTED NOT DETECTED Final   Serratia marcescens NOT DETECTED NOT DETECTED Final   Haemophilus influenzae NOT DETECTED NOT DETECTED Final   Neisseria meningitidis NOT DETECTED NOT DETECTED Final   Pseudomonas aeruginosa NOT DETECTED NOT DETECTED Final   Stenotrophomonas maltophilia NOT DETECTED NOT DETECTED Final   Candida albicans NOT DETECTED NOT DETECTED Final   Candida auris NOT DETECTED NOT DETECTED Final   Candida glabrata NOT DETECTED NOT DETECTED Final   Candida krusei NOT DETECTED NOT DETECTED Final   Candida parapsilosis NOT DETECTED NOT DETECTED Final   Candida tropicalis NOT DETECTED NOT DETECTED Final   Cryptococcus neoformans/gattii NOT DETECTED NOT DETECTED Final   CTX-M ESBL NOT DETECTED NOT DETECTED Final   Carbapenem resistance IMP NOT DETECTED NOT DETECTED Final   Carbapenem resistance KPC NOT DETECTED NOT DETECTED Final   Carbapenem resistance NDM NOT DETECTED NOT DETECTED Final   Carbapenem resist OXA 48 LIKE NOT DETECTED NOT DETECTED Final   Carbapenem resistance VIM NOT DETECTED NOT DETECTED Final    Comment: Performed at [facility], [address], [place], [place] [address]  Urine Culture     Status: Abnormal   Collection Time: 05/19/22  4:40 PM   Specimen: Urine, Random  Result Value Ref Range Status   Specimen Description   Final    URINE, RANDOM Performed at [place] [address], [place], [place] [address]    Special Requests   Final    NONE Reflexed from [id number] Performed at [place] [address], [place], [place] [address]    Culture >=100,000 COLONIES/mL ESCHERICHIA COLI (A)  Final   Report Status 05/21/2022 FINAL  Final   Organism ID, Bacteria ESCHERICHIA COLI (A)  Final      Susceptibility   Escherichia coli - MIC*  AMPICILLIN 4 SENSITIVE Sensitive     CEFAZOLIN <=4 SENSITIVE Sensitive     CEFEPIME <=0.12 SENSITIVE  Sensitive     CEFTRIAXONE <=0.25 SENSITIVE Sensitive     CIPROFLOXACIN <=0.25 SENSITIVE Sensitive     GENTAMICIN <=1 SENSITIVE Sensitive     IMIPENEM <=0.25 SENSITIVE Sensitive     NITROFURANTOIN <=16 SENSITIVE Sensitive     TRIMETH/SULFA <=20 SENSITIVE Sensitive     AMPICILLIN/SULBACTAM <=2 SENSITIVE Sensitive     PIP/TAZO <=4 SENSITIVE Sensitive     * >=100,000 COLONIES/mL ESCHERICHIA COLI  MRSA Next Gen by PCR, Nasal     Status: Abnormal   Collection Time: 05/19/22  8:13 PM   Specimen: Nasal Mucosa; Nasal Swab  Result Value Ref Range Status   MRSA by PCR Next Gen DETECTED (A) NOT DETECTED Final    Comment: (NOTE) The GeneXpert MRSA Assay (FDA approved for NASAL specimens only), is one component of a comprehensive MRSA colonization surveillance program. It is not intended to diagnose MRSA infection nor to guide or monitor treatment for MRSA infections. Test performance is not FDA approved in patients less than 77 years old. Performed at [facility], [place] [address], [place], [place] [address]    Labs: CBC: Recent Labs  Lab 05/20/22 0019 05/21/22 0306 05/22/22 0314 05/23/22 0551 05/24/22 0647  WBC 20.1* 19.5* 10.7* 8.8 7.6  NEUTROABS  --   --  9.0* 5.9  --   HGB 9.9* 10.5* 11.0* 11.2* 10.7*  HCT 29.4* 31.9* 32.8* 34.5* 31.7*  MCV 99.3 98.5 97.0 100.9* 95.2  PLT 150 156 168 141* [id number]   Basic Metabolic Panel: Recent Labs  Lab 05/19/22 2233 05/20/22 0019 05/20/22 0739 05/20/22 1711 05/21/22 0300 05/21/22 0306 05/22/22 0314 05/23/22 0551 05/24/22 0647  NA 134*   < >  --  134*  --  133* 133* 131* 132*  K 3.3*   < >  --  3.8  --  3.7 3.8 3.3* 4.0  CL 107   < >  --  102  --  104 98 93* 96*  CO2 21*   < >  --  23  --  '22 26 27 28  '$ GLUCOSE 103*   < >  --  131*  --  121* 119* 92 94  BUN 34*   < >  --  31*  --  31* 27* 18 18  CREATININE 1.67*   < >  --  1.46*  --  1.34* 1.29* 1.13* 1.10*  CALCIUM 6.8*   < >  --  7.1*  --  7.0* 7.7* 8.2* 8.3*  MG  1.7  --  1.8  --  2.0  --  2.1 1.7  --   PHOS  --   --  3.5  --  1.9*  --  2.4* 3.5  --    < > = values in this interval not displayed.   Liver Function Tests: Recent Labs  Lab 05/19/22 2233 05/20/22 0019 05/21/22 0300 05/22/22 0314 05/23/22 0551  AST 38 39 52* 92* 84*  ALT '22 23 26 '$ 49* 53*  ALKPHOS 109 71 69 90 86  BILITOT 0.5 0.6 0.5 0.5 0.5  PROT 5.0* 5.0* 5.6* 6.1* 6.1*  ALBUMIN 2.6* 2.6* 2.5* 2.7* 2.7*   CBG: No results for input(s): "GLUCAP" in the last 168 hours.  Discharge time spent: greater than 30 minutes.  Signed: Raiford Noble, DO [facility] 05/24/2022

## 2022-05-24 NOTE — Plan of Care (Signed)

## 2022-05-24 NOTE — TOC Progression Note (Addendum)
Transition of Care [facility]) - Progression Note    Patient Details  Name: Brandy Hicks MRN: [id number] Date of Birth: 06/22/1932  Transition of Care [facility] CM/SW Contact  Henrietta Dine, RN Phone Number: 05/24/2022, 3:56 PM  Clinical Narrative:    [facility] consult for HHPT/OT, and home oxygen; per Merleen Nicely at [facility] no contracted agency; spoke w/ pt in room; she says she plans to return to [place] at d/c; pt says she has transportation; she denies IPV, food insecurity, and difficulty paying utilities; pt identified POC grand dtr Micki Riley [phone]); pt says she has glasses, walker, shower chair, and bars in her shower; she does not have [place] services or hoe oxygen; pt agrees to recc services and she does not have an agency preference; contacted Erasmo Downer at [employer] and she says agency can provider services to pt; she also confirms travel tank will be delivered to pt's room prior to d/c; spoke w/ Malachy Mood at [place] and she says agency can provide services; pt notified and verbalized understanding; agency contact info placed in follow up provider section of d/c instructions; no TOC needs.   Expected Discharge Plan: Home/Self Care Barriers to Discharge: No Barriers Identified  Expected Discharge Plan and Services In-house Referral: NA Discharge Planning Services: CM Consult Post Acute Care Choice: Durable Medical Equipment, Home Health (home oxygen, HHPT/OT) Living arrangements for the past 2 months: [place] Expected Discharge Date: 05/23/22               DME Arranged: Oxygen DME Agency: [employer] Date DME Agency Contacted: 05/24/22 Time DME Agency Contacted: [phone] Representative spoke with at DME Agency: Erasmo Downer HH Arranged: PT, OT [facility] Agency: [place] Date [place]: 05/24/22 Time Pump Back: 1548 Representative spoke with at [place]: [place] ([place])  Interventions SDOH Screenings   Food Insecurity: No Food Insecurity (05/19/2022)  Housing: Low Risk  (05/19/2022)  Transportation Needs: No Transportation Needs (05/19/2022)  Utilities: Not At Risk (05/19/2022)  Depression (PHQ2-9): Low Risk  (01/31/2020)  Tobacco Use: Medium Risk (05/19/2022)    Readmission Risk Interventions     No data to display

## 2022-05-24 NOTE — Plan of Care (Addendum)
SATURATION QUALIFICATIONS: (This note is used to comply with regulatory documentation for home oxygen)  Patient Saturations on Room Air at Rest = 92%  Patient Saturations on Room Air while Ambulating = 87%  Patient Saturations on 2 Liters of oxygen while Ambulating = 95%  Please briefly explain why patient needs home oxygen: PATIENT WAS 92% ON ROOM AIR AT REST BUT QUICKLY DESATTED TO 87% WHILE AMBULATING ON ROOM AIR. OXYGEN WAS ADDED AT 2L WHILE AMBULATING AND SATS CAME UP TO 95% AND THEN UP TO 98% WHEN PLACED BACK IN BED.

## 2022-05-24 NOTE — Plan of Care (Signed)
Vernal education provided

## 2022-05-28 ENCOUNTER — Inpatient Hospital Stay (HOSPITAL_COMMUNITY)
Admission: EM | Admit: 2022-05-28 | Discharge: 2022-05-30 | DRG: 310 | Disposition: A | Discharge status: Home / Self Care | Payer: Medicare HMO | Attending: Internal Medicine | Admitting: Internal Medicine

## 2022-05-28 ENCOUNTER — Other Ambulatory Visit: Payer: Self-pay

## 2022-05-28 ENCOUNTER — Encounter (HOSPITAL_COMMUNITY): Payer: Self-pay

## 2022-05-28 ENCOUNTER — Emergency Department (HOSPITAL_COMMUNITY): Payer: Medicare HMO

## 2022-05-28 DIAGNOSIS — Z8669 Personal history of other diseases of the nervous system and sense organs: Secondary | ICD-10-CM | POA: Diagnosis not present

## 2022-05-28 DIAGNOSIS — D649 Anemia, unspecified: Secondary | ICD-10-CM | POA: Diagnosis present

## 2022-05-28 DIAGNOSIS — Z79899 Other long term (current) drug therapy: Secondary | ICD-10-CM | POA: Diagnosis not present

## 2022-05-28 DIAGNOSIS — T447X5A Adverse effect of beta-adrenoreceptor antagonists, initial encounter: Secondary | ICD-10-CM | POA: Diagnosis present

## 2022-05-28 DIAGNOSIS — R001 Bradycardia, unspecified: Principal | ICD-10-CM

## 2022-05-28 DIAGNOSIS — Z823 Family history of stroke: Secondary | ICD-10-CM

## 2022-05-28 DIAGNOSIS — E785 Hyperlipidemia, unspecified: Secondary | ICD-10-CM | POA: Diagnosis present

## 2022-05-28 DIAGNOSIS — Z87891 Personal history of nicotine dependence: Secondary | ICD-10-CM | POA: Diagnosis not present

## 2022-05-28 DIAGNOSIS — Z8249 Family history of ischemic heart disease and other diseases of the circulatory system: Secondary | ICD-10-CM

## 2022-05-28 DIAGNOSIS — B9710 Unspecified enterovirus as the cause of diseases classified elsewhere: Secondary | ICD-10-CM | POA: Diagnosis present

## 2022-05-28 DIAGNOSIS — F32A Depression, unspecified: Secondary | ICD-10-CM | POA: Diagnosis present

## 2022-05-28 DIAGNOSIS — J449 Chronic obstructive pulmonary disease, unspecified: Secondary | ICD-10-CM | POA: Diagnosis present

## 2022-05-28 DIAGNOSIS — B9789 Other viral agents as the cause of diseases classified elsewhere: Secondary | ICD-10-CM | POA: Diagnosis present

## 2022-05-28 DIAGNOSIS — Z888 Allergy status to other drugs, medicaments and biological substances status: Secondary | ICD-10-CM

## 2022-05-28 DIAGNOSIS — Z79890 Hormone replacement therapy (postmenopausal): Secondary | ICD-10-CM

## 2022-05-28 DIAGNOSIS — Z8673 Personal history of transient ischemic attack (TIA), and cerebral infarction without residual deficits: Secondary | ICD-10-CM | POA: Diagnosis not present

## 2022-05-28 DIAGNOSIS — I129 Hypertensive chronic kidney disease with stage 1 through stage 4 chronic kidney disease, or unspecified chronic kidney disease: Secondary | ICD-10-CM | POA: Diagnosis present

## 2022-05-28 DIAGNOSIS — Z7982 Long term (current) use of aspirin: Secondary | ICD-10-CM | POA: Diagnosis not present

## 2022-05-28 DIAGNOSIS — E039 Hypothyroidism, unspecified: Secondary | ICD-10-CM | POA: Diagnosis present

## 2022-05-28 DIAGNOSIS — N1832 Chronic kidney disease, stage 3b: Secondary | ICD-10-CM | POA: Diagnosis present

## 2022-05-28 DIAGNOSIS — R008 Other abnormalities of heart beat: Secondary | ICD-10-CM | POA: Diagnosis not present

## 2022-05-28 DIAGNOSIS — Z66 Do not resuscitate: Secondary | ICD-10-CM | POA: Diagnosis present

## 2022-05-28 DIAGNOSIS — J439 Emphysema, unspecified: Secondary | ICD-10-CM | POA: Diagnosis present

## 2022-05-28 DIAGNOSIS — Z820 Family history of epilepsy and other diseases of the nervous system: Secondary | ICD-10-CM

## 2022-05-28 LAB — CBC WITH DIFFERENTIAL/PLATELET
Abs Immature Granulocytes: 0.1 10*3/uL — ABNORMAL HIGH (ref 0.00–0.07)
Basophils Absolute: 0.1 10*3/uL (ref 0.0–0.1)
Basophils Relative: 1 %
Eosinophils Absolute: 0.1 10*3/uL (ref 0.0–0.5)
Eosinophils Relative: 1 %
HCT: 30 % — ABNORMAL LOW (ref 36.0–46.0)
Hemoglobin: 10.1 g/dL — ABNORMAL LOW (ref 12.0–15.0)
Immature Granulocytes: 1 %
Lymphocytes Relative: 21 %
Lymphs Abs: 1.9 10*3/uL (ref 0.7–4.0)
MCH: 32.8 pg (ref 26.0–34.0)
MCHC: 33.7 g/dL (ref 30.0–36.0)
MCV: 97.4 fL (ref 80.0–100.0)
Monocytes Absolute: 0.5 10*3/uL (ref 0.1–1.0)
Monocytes Relative: 6 %
Neutro Abs: 6.3 10*3/uL (ref 1.7–7.7)
Neutrophils Relative %: 70 %
Platelets: 378 10*3/uL (ref 150–400)
RBC: 3.08 MIL/uL — ABNORMAL LOW (ref 3.87–5.11)
RDW: 13.3 % (ref 11.5–15.5)
WBC: 9 10*3/uL (ref 4.0–10.5)
nRBC: 0 % (ref 0.0–0.2)

## 2022-05-28 LAB — COMPREHENSIVE METABOLIC PANEL
ALT: 34 U/L (ref 0–44)
AST: 35 U/L (ref 15–41)
Albumin: 2.5 g/dL — ABNORMAL LOW (ref 3.5–5.0)
Alkaline Phosphatase: 74 U/L (ref 38–126)
Anion gap: 9 (ref 5–15)
BUN: 18 mg/dL (ref 8–23)
CO2: 22 mmol/L (ref 22–32)
Calcium: 8.2 mg/dL — ABNORMAL LOW (ref 8.9–10.3)
Chloride: 104 mmol/L (ref 98–111)
Creatinine, Ser: 1.14 mg/dL — ABNORMAL HIGH (ref 0.44–1.00)
GFR, Estimated: 47 mL/min — ABNORMAL LOW (ref >60)
Glucose, Bld: 104 mg/dL — ABNORMAL HIGH (ref 70–99)
Potassium: 3.9 mmol/L (ref 3.5–5.1)
Sodium: 135 mmol/L (ref 135–145)
Total Bilirubin: 0.4 mg/dL (ref 0.3–1.2)
Total Protein: 6.1 g/dL — ABNORMAL LOW (ref 6.5–8.1)

## 2022-05-28 LAB — BRAIN NATRIURETIC PEPTIDE: B Natriuretic Peptide: 228.7 pg/mL — ABNORMAL HIGH (ref 0.0–100.0)

## 2022-05-28 LAB — MAGNESIUM: Magnesium: 1.5 mg/dL — ABNORMAL LOW (ref 1.7–2.4)

## 2022-05-28 LAB — TSH
TSH: 5.325 u[IU]/mL — ABNORMAL HIGH (ref 0.350–4.500)
TSH: 5.107 u[IU]/mL — ABNORMAL HIGH (ref 0.350–4.500)

## 2022-05-28 LAB — TROPONIN I (HIGH SENSITIVITY)
Troponin I (High Sensitivity): 17 ng/L (ref <18)
Troponin I (High Sensitivity): 14 ng/L (ref <18)

## 2022-05-28 LAB — T4, FREE: Free T4: 1.2 ng/dL — ABNORMAL HIGH (ref 0.61–1.12)

## 2022-05-28 MED ORDER — MAGNESIUM SULFATE 4 GM/100ML IV SOLN
4.0000 g | INTRAVENOUS | Status: AC
  Administered 2022-05-28: 4 g via INTRAVENOUS
  Filled 2022-05-28: qty 100

## 2022-05-28 NOTE — ED Notes (Signed)
Shift report received, assumed care of patient at this time.  

## 2022-05-28 NOTE — ED Provider Notes (Signed)
[facility] Provider Note   CSN: [id number] Arrival date & time: 05/28/22  1714     History  Chief Complaint  Patient presents with   Weakness   Bradycardia    Darden Amber Der Dorene Ar is a 89 y.o. female.  HPI   89 year old female with recent admission for sepsis secondary to UTI and pneumonia presents to the emergency department with concern for weakness.  Patient is currently on p.o. Augmentin to continue treatment for UTI/pneumonia.  She has been doing well at home the past couple days.  Woke up this morning feeling generally unwell.  However woke up from her nap feeling specifically weak.  Had a difficult time walking to the bathroom, became pale and the daughter sat her down.  EMS reports heart rates in the 30s with an appropriate blood pressure, given a dose of atropine prior to arrival with improvement.  Patient denies symptoms including headache, chest pain, cough, swelling of her lower extremities, abdominal pain/nausea/vomiting/diarrhea.  States has been compliant with her medications.  Home Medications Prior to Admission medications   Medication Sig Start Date End Date Taking? Authorizing Provider  amLODipine (NORVASC) 10 MG tablet Take 10 mg by mouth daily.    [provider]  amoxicillin-clavulanate (AUGMENTIN) 500-125 MG tablet Take 1 tablet by mouth every 12 (twelve) hours for 5 days. 05/24/22 05/29/22  Raiford Noble Latif, DO  aspirin 81 MG tablet Take 81 mg by mouth daily.    [provider]  atorvastatin (LIPITOR) 40 MG tablet Take 1 tablet (40 mg total) by mouth daily. 05/30/22   Raiford Noble Latif, DO  chlorthalidone (HYGROTEN) 15 MG tablet Take 1 tablet (15 mg total) by mouth daily. 05/24/22   Raiford Noble Latif, DO  Cholecalciferol (VITAMIN D) 125 MCG (5000 UT) CAPS Take 5,000 Units by mouth daily.    [provider]  feeding supplement (ENSURE ENLIVE / ENSURE PLUS) LIQD Take 237 mLs by mouth 3  (three) times daily between meals. 05/24/22   Sheikh, Omair Latif, DO  fluticasone (FLONASE) 50 MCG/ACT nasal spray Place 2 sprays into both nostrils daily.    [provider]  guaiFENesin (MUCINEX) 600 MG 12 hr tablet Take 1 tablet (600 mg total) by mouth 2 (two) times daily for 5 days. 05/24/22 05/29/22  Raiford Noble Latif, DO  levothyroxine (SYNTHROID) 150 MCG tablet Take 150 mcg by mouth daily. 02/04/19   [provider]  losartan (COZAAR) 100 MG tablet Take 100 mg by mouth daily.    [provider]  Melatonin 10 MG TABS Take 10 mg by mouth at bedtime.     [provider]  Multiple Vitamins-Minerals (MULTIVITAMIN ADULT PO) Take 1 tablet by mouth daily.    [provider]  ondansetron (ZOFRAN) 4 MG tablet Take 1 tablet (4 mg total) by mouth every 6 (six) hours as needed for nausea. 05/24/22   Raiford Noble Latif, DO  primidone (MYSOLINE) 50 MG tablet Take 200 mg by mouth 2 (two) times daily.    [provider]  propranolol (INDERAL) 60 MG tablet Take 60 mg by mouth in the morning.    [provider]  senna-docusate (SENOKOT-S) 8.6-50 MG tablet Take 1 tablet by mouth at bedtime as needed for mild constipation. 05/24/22   Raiford Noble Latif, DO  sertraline (ZOLOFT) 50 MG tablet Take 50 mg by mouth daily.  05/21/15   [provider]  witch hazel-glycerin (TUCKS) pad Apply topically as needed  for itching. 05/24/22   Raiford Noble Latif, DO      Allergies    Hydroquinone    Review of Systems   Review of Systems  Constitutional:  Positive for fatigue. Negative for fever.  Respiratory:  Negative for chest tightness and shortness of breath.   Cardiovascular:  Negative for chest pain and leg swelling.  Gastrointestinal:  Negative for abdominal pain, diarrhea and vomiting.  Musculoskeletal:  Negative for back pain.  Skin:  Negative for rash.  Neurological:  Positive for light-headedness. Negative for facial asymmetry, speech difficulty,  weakness, numbness and headaches.    Physical Exam Updated Vital Signs BP (!) 125/42 (BP Location: Right Arm)   Pulse (!) 44   Temp 98.1 F (36.7 C) (Oral)   Resp 16   SpO2 95%  Physical Exam Vitals and nursing note reviewed.  Constitutional:      General: She is not in acute distress.    Appearance: Normal appearance. She is not ill-appearing.  HENT:     Head: Normocephalic.     Mouth/Throat:     Mouth: Mucous membranes are moist.  Cardiovascular:     Rate and Rhythm: Normal rate.  Pulmonary:     Effort: Pulmonary effort is normal. No respiratory distress.     Breath sounds: No rales.  Abdominal:     Palpations: Abdomen is soft.     Tenderness: There is no abdominal tenderness. There is no guarding.  Musculoskeletal:        General: No swelling.  Skin:    General: Skin is warm.  Neurological:     Mental Status: She is alert and oriented to person, place, and time. Mental status is at baseline.  Psychiatric:        Mood and Affect: Mood normal.     ED Results / Procedures / Treatments   Labs (all labs ordered are listed, but only abnormal results are displayed) Labs Reviewed  CBC WITH DIFFERENTIAL/PLATELET - Abnormal; Notable for the following components:      Result Value   RBC 3.08 (*)    Hemoglobin 10.1 (*)    HCT 30.0 (*)    Abs Immature Granulocytes 0.10 (*)    All other components within normal limits  COMPREHENSIVE METABOLIC PANEL  BRAIN NATRIURETIC PEPTIDE  MAGNESIUM  TSH  URINALYSIS, ROUTINE W REFLEX MICROSCOPIC  TROPONIN I (HIGH SENSITIVITY)    EKG EKG Interpretation  Date/Time:  Wednesday May 28 2022 17:36:24 EDT Ventricular Rate:  41 PR Interval:  176 QRS Duration: 74 QT Interval:  469 QTC Calculation: 388 R Axis:   66 Text Interpretation: Sinus bradycardia Atrial premature complexes in couplets Confirmed by Lavenia Atlas ([id number]) on 05/28/2022 6:39:12 PM  Radiology No results found.  Procedures .Critical Care  Performed by:  Lorelle Gibbs, DO Authorized by: Lorelle Gibbs, DO   Critical care provider statement:    Critical care time (minutes):  30   Critical care was necessary to treat or prevent imminent or life-threatening deterioration of the following conditions:  Cardiac failure and circulatory failure   Critical care was time spent personally by me on the following activities:  Development of treatment plan with patient or surrogate, discussions with consultants, evaluation of patient's response to treatment, examination of patient, ordering and review of laboratory studies, ordering and review of radiographic studies, ordering and performing treatments and interventions, pulse oximetry, re-evaluation of patient's condition and review of old charts Comments:     atropine     Medications  Ordered in ED Medications - No data to display  ED Course/ Medical Decision Making/ A&P                             Medical Decision Making Amount and/or Complexity of Data Reviewed Labs: ordered. Radiology: ordered.   89 year old female presents emergency department after an episode of symptomatic bradycardia, resolved with atropine by EMS.  On her level patient has heart rates in the 60s, stable blood pressure.  She states that she feels improved.  She has been compliant with her oral antibiotics and denies any other acute change in her health.  EKG is sinus rhythm.  Initial blood work shows a baseline anemia, baseline creatinine.  Magnesium is slightly low at 1.5.  More concerning is her TSH is now 5.4.  At her last admission it was low.  It sounds like her levothyroxine dose was adjusted and she is currently taking that new dose.  However this low reading was 8 days ago.  Would not expect levels to change this drastically.  Repeat TSH, T3 and T4 have been ordered.  Concern for symptomatic bradycardia requiring atropine in the setting of possible hypothyroidism.  Will plan for admission and further care.  On  reevaluation heart rates are in the 50s, stable blood pressure.  Patient feels well.  Patients evaluation and results requires admission for further treatment and care.  Spoke with hospitalist, reviewed patient's ED course and they accept admission.  Patient agrees with admission plan, offers no new complaints and is stable/unchanged at time of admit.        Final Clinical Impression(s) / ED Diagnoses Final diagnoses:  None    Rx / DC Orders ED Discharge Orders     None         Lorelle Gibbs, DO 05/28/22 2207

## 2022-05-28 NOTE — H&P (Incomplete)
History and Physical    CLYDETTE ZUNKER [name] [id number] DOB: 1932/06/24 DOA: 05/28/2022  PCP: Davina Poke, FNP  Patient coming from: home  I have personally briefly reviewed patient's old medical records in [place]  Chief Complaint: dizzy/weak hours pta   HPI: Brandy Hicks Der Dorene Ar is a 89 y.o. female with medical history significant of  HTN,essential tremor, hyperlipidemia, osteoporosis, hypothyroidism, Guillain-Barr syndrome about 5 years ago, CKD3b b/l creat of 1-1.2  who has interim history of recent hospitalization 3/4-3/9 with diagnosis of sepsis due to E. Coli UTI, CAP Strep Pneumo, and Ctnoting emphysematous lung changes.  Patient was discharged on oral antibiotics and has since complete her course. Patient now returns to ED BIB EMS with feeling faint and having low blood pressure in setting of heart rate of 30's in the field necessitating atropine x1  as weel as 400cc ns with improvement in pain heart rate and symptoms.  ED Course:  Temp 97.5, bp 125/42, hr 60-44, rr 19sat 95% on [place]  Wbc 9, hgb 10.1 ( 10.7) , plt378 BNP228.7 N a 135, K 3.9, CL 104, glu 104, cr 1.14  (1.1) Mag 1.5 TSH 5.3 CE 17,14 Cxr: IMPRESSION: Hyperexpansion with chronic interstitial coarsening. No acute cardiopulmonary findings.    Review of Systems: As per HPI otherwise 10 point review of systems negative.   Past Medical History:  Diagnosis Date   Chronic kidney disease, stage 3 unspecified (HCC)    Chronic kidney disease, unspecified    Constipation, unspecified    Depression    Essential hypertension    Essential tremor    Guillain Barr syndrome ([place])    Hormone replacement therapy    Hyperlipidemia    Hypertension    Hypothyroid    Hypothyroidism, unspecified    Impacted cerumen of both ears    Impacted cerumen of right ear    Osteoporosis    Ovarian cyst    Radiculopathy, thoracic region    Retention of urine, unspecified    Rosacea    Seborrheic dermatitis,  unspecified    Somnolence    Thoracic spine pain    TIA (transient ischemic attack)    Tremor    Unspecified mood (affective) disorder ([place])     Past Surgical History:  Procedure Laterality Date   CHOLECYSTECTOMY N/A 01/03/2020   Procedure: LAPAROSCOPIC CHOLECYSTECTOMY;  Surgeon: Erroll Luna, MD;  Location: [facility];  Service: General;  Laterality: N/A;   FACIAL COSMETIC SURGERY     GALLBLADDER SURGERY     Post [place] Gallbladder Removal    HEEL SPUR SURGERY     HEEL SPUR SURGERY     TONSILLECTOMY       reports that she has quit smoking. She has never used smokeless tobacco. She reports that she does not drink alcohol and does not use drugs.  Allergies  Allergen Reactions   Hydroquinone     Pinkness and edema of face and eyelids, severe    Family History  Problem Relation Age of Onset   Stroke Mother    Hypertension Father    CAD Father    Prostate cancer Father    Liver disease Sister    Hypertension Daughter    Thyroid disease Daughter    Breast cancer Daughter    Heart murmur Daughter    Hypertension Daughter    Multiple sclerosis Daughter    Kidney disease Daughter     Prior to Admission medications   Medication Sig Start Date  End Date Taking? Authorizing Provider  amLODipine (NORVASC) 10 MG tablet Take 10 mg by mouth daily.    [provider]  amoxicillin-clavulanate (AUGMENTIN) 500-125 MG tablet Take 1 tablet by mouth every 12 (twelve) hours for 5 days. 05/24/22 05/29/22  Raiford Noble Latif, DO  aspirin 81 MG tablet Take 81 mg by mouth daily.    [provider]  atorvastatin (LIPITOR) 40 MG tablet Take 1 tablet (40 mg total) by mouth daily. 05/30/22   Raiford Noble Latif, DO  chlorthalidone (HYGROTEN) 15 MG tablet Take 1 tablet (15 mg total) by mouth daily. 05/24/22   Raiford Noble Latif, DO  Cholecalciferol (VITAMIN D) 125 MCG (5000 UT) CAPS Take 5,000 Units by mouth daily.    [provider]  feeding supplement (ENSURE ENLIVE /  ENSURE PLUS) LIQD Take 237 mLs by mouth 3 (three) times daily between meals. 05/24/22   Sheikh, Omair Latif, DO  fluticasone (FLONASE) 50 MCG/ACT nasal spray Place 2 sprays into both nostrils daily.    [provider]  guaiFENesin (MUCINEX) 600 MG 12 hr tablet Take 1 tablet (600 mg total) by mouth 2 (two) times daily for 5 days. 05/24/22 05/29/22  Raiford Noble Latif, DO  levothyroxine (SYNTHROID) 150 MCG tablet Take 150 mcg by mouth daily. 02/04/19   [provider]  losartan (COZAAR) 100 MG tablet Take 100 mg by mouth daily.    [provider]  Melatonin 10 MG TABS Take 10 mg by mouth at bedtime.     [provider]  Multiple Vitamins-Minerals (MULTIVITAMIN ADULT PO) Take 1 tablet by mouth daily.    [provider]  ondansetron (ZOFRAN) 4 MG tablet Take 1 tablet (4 mg total) by mouth every 6 (six) hours as needed for nausea. 05/24/22   Raiford Noble Latif, DO  primidone (MYSOLINE) 50 MG tablet Take 200 mg by mouth 2 (two) times daily.    [provider]  propranolol (INDERAL) 60 MG tablet Take 60 mg by mouth in the morning.    [provider]  senna-docusate (SENOKOT-S) 8.6-50 MG tablet Take 1 tablet by mouth at bedtime as needed for mild constipation. 05/24/22   Raiford Noble Latif, DO  sertraline (ZOLOFT) 50 MG tablet Take 50 mg by mouth daily.  05/21/15   [provider]  witch hazel-glycerin (TUCKS) pad Apply topically as needed for itching. 05/24/22   Kerney Elbe, DO    Physical Exam: Vitals:   05/28/22 1800 05/28/22 1845 05/28/22 1915 05/28/22 2000  BP: (!) 132/46 (!) 142/59 (!) 118/58 (!) 116/51  Pulse: (!) 57 (!) 56 (!) 102 (!) 53  Resp: 16 (!) 0 14 19  Temp:    97.7 F (36.5 C)  TempSrc:    Oral  SpO2: 100% 100% 98% 100%    Constitutional: NAD, calm, comfortable Vitals:   05/28/22 1800 05/28/22 1845 05/28/22 1915 05/28/22 2000  BP: (!) 132/46 (!) 142/59 (!) 118/58 (!) 116/51  Pulse: (!) 57 (!) 56 (!) 102 (!)  53  Resp: 16 (!) 0 14 19  Temp:    97.7 F (36.5 C)  TempSrc:    Oral  SpO2: 100% 100% 98% 100%   Eyes: PERRL, lids and conjunctivae normal ENMT: Mucous membranes are moist. Posterior pharynx clear of any exudate or lesions.Normal dentition.  Neck: normal, supple, no masses, no thyromegaly Respiratory: clear to auscultation bilaterally, no wheezing, no crackles. Normal respiratory effort. No accessory muscle use.  Cardiovascular: Regular rate and rhythm, no murmurs / rubs /  gallops. No extremity edema. 2+ pedal pulses. No carotid bruits.  Abdomen: no tenderness, no masses palpated. No hepatosplenomegaly. Bowel sounds positive.  Musculoskeletal: no clubbing / cyanosis. No joint deformity upper and lower extremities. Good ROM, no contractures. Normal muscle tone.  Skin: no rashes, lesions, ulcers. No induration Neurologic: CN 2-12 grossly intact. Sensation intact, DTR normal. Strength 5/5 in all 4.  Psychiatric: Normal judgment and insight. Alert and oriented x 3. Normal mood.    Labs on Admission: I have personally reviewed following labs and imaging studies  CBC: Recent Labs  Lab 05/22/22 0314 05/23/22 0551 05/24/22 0647 05/28/22 1752  WBC 10.7* 8.8 7.6 9.0  NEUTROABS 9.0* 5.9  --  6.3  HGB 11.0* 11.2* 10.7* 10.1*  HCT 32.8* 34.5* 31.7* 30.0*  MCV 97.0 100.9* 95.2 97.4  PLT 168 141* 213 [id number]   Basic Metabolic Panel: Recent Labs  Lab 05/22/22 0314 05/23/22 0551 05/24/22 0647 05/28/22 1752  NA 133* 131* 132* 135  K 3.8 3.3* 4.0 3.9  CL 98 93* 96* 104  CO2 '26 27 28 22  '$ GLUCOSE 119* 92 94 104*  BUN 27* '18 18 18  '$ CREATININE 1.29* 1.13* 1.10* 1.14*  CALCIUM 7.7* 8.2* 8.3* 8.2*  MG 2.1 1.7  --  1.5*  PHOS 2.4* 3.5  --   --    GFR: Estimated Creatinine Clearance: 28.5 mL/min (A) (by C-G formula based on SCr of 1.14 mg/dL (H)). Liver Function Tests: Recent Labs  Lab 05/22/22 0314 05/23/22 0551 05/28/22 1752  AST 92* 84* 35  ALT 49* 53* 34  ALKPHOS 90 86 74   BILITOT 0.5 0.5 0.4  PROT 6.1* 6.1* 6.1*  ALBUMIN 2.7* 2.7* 2.5*   No results for input(s): "LIPASE", "AMYLASE" in the last 168 hours. No results for input(s): "AMMONIA" in the last 168 hours. Coagulation Profile: No results for input(s): "INR", "PROTIME" in the last 168 hours. Cardiac Enzymes: No results for input(s): "CKTOTAL", "CKMB", "CKMBINDEX", "TROPONINI" in the last 168 hours. BNP (last 3 results) No results for input(s): "PROBNP" in the last 8760 hours. HbA1C: No results for input(s): "HGBA1C" in the last 72 hours. CBG: No results for input(s): "GLUCAP" in the last 168 hours. Lipid Profile: No results for input(s): "CHOL", "HDL", "LDLCALC", "TRIG", "CHOLHDL", "LDLDIRECT" in the last 72 hours. Thyroid Function Tests: Recent Labs    05/28/22 1752  TSH 5.325*   Anemia Panel: No results for input(s): "VITAMINB12", "FOLATE", "FERRITIN", "TIBC", "IRON", "RETICCTPCT" in the last 72 hours. Urine analysis:    Component Value Date/Time   COLORURINE YELLOW 05/19/2022 1640   APPEARANCEUR HAZY (A) 05/19/2022 1640   LABSPEC 1.014 05/19/2022 1640   PHURINE 5.0 05/19/2022 1640   GLUCOSEU NEGATIVE 05/19/2022 1640   HGBUR SMALL (A) 05/19/2022 1640   BILIRUBINUR NEGATIVE 05/19/2022 1640   KETONESUR NEGATIVE 05/19/2022 1640   PROTEINUR 100 (A) 05/19/2022 1640   NITRITE NEGATIVE 05/19/2022 1640   LEUKOCYTESUR MODERATE (A) 05/19/2022 1640    Radiological Exams on Admission: DG Chest Port 1 View  Result Date: 05/28/2022 CLINICAL DATA:  Bradycardia. EXAM: PORTABLE CHEST 1 VIEW COMPARISON:  05/24/2022 FINDINGS: Lungs are hyperexpanded. The lungs are clear without focal pneumonia, edema, pneumothorax or pleural effusion. Interstitial markings are diffusely coarsened with chronic features. Cardiopericardial silhouette is at upper limits of normal for size. The visualized bony structures of the thorax are unremarkable. Telemetry leads overlie the chest. IMPRESSION: Hyperexpansion with  chronic interstitial coarsening. No acute cardiopulmonary findings. Electronically Signed   By: Verda Cumins.D.  On: 05/28/2022 18:47    EKG: Independently reviewed.   Assessment/Plan Symptomatic Bradycardia in setting of hypomagnesemia as well as propanolol use  -admit to progressive care  -place on tele -repeat EKG now and in am /place pacer pads -CE flat, EKG sinus bradycardia no acute st-twave changes  -atropine by bedside  -replete magnesium  -hold propanolol  -echo in am  -cardiology consult in am   Hypomagnesemia -replete mag  -f/u on labs   Hypertension  -hold anti-hypertensive medications due to low bp   CKDIIIb  -at baseline   E coli UTI  Strep pneumo  Sepsis  -s/p d/c 3/8 -S/p abx treatment , abx course completed as out patient  -will check inflammatory markers to be complete  -blood cultures   Hx of Guillain Barre -has chronic weakness, no acute issues  -PT/OT  Hypothyroidism  -tsh was low at 0.18 previous hospitalizations -synthroid decreased to 128mg daily   Hx of Urinary retention -monitor for urinary rentention while admitted   Anemia -stable   DVT prophylaxis: heparin Code Status: DNR/ as discussed per patient wishes in event of cardiac arrest  Family Communication: none at bedside Disposition Plan: patient  expected to be admitted greater than 2 midnights  Consults called:n/a Admission status: progressive   SClance BollMD [facility]   If 7PM-7AM, please contact night-coverage [email or web address] Password [email or web address] 05/28/2022, 10:03 PM

## 2022-05-28 NOTE — ED Triage Notes (Signed)
Pt normally ambulatory without walker, woke up from nap today and was dizzy and weak. Initial heart rate 34 via pulse ox. EKG by EMS shows SB. '1mg'$  atropine and 400cc NS given by EMS with improvement. Patient denies complaints on arrival.

## 2022-05-29 ENCOUNTER — Encounter (HOSPITAL_COMMUNITY): Payer: Self-pay | Admitting: Internal Medicine

## 2022-05-29 ENCOUNTER — Inpatient Hospital Stay (HOSPITAL_COMMUNITY): Payer: Medicare HMO

## 2022-05-29 DIAGNOSIS — R001 Bradycardia, unspecified: Secondary | ICD-10-CM | POA: Diagnosis not present

## 2022-05-29 DIAGNOSIS — R008 Other abnormalities of heart beat: Secondary | ICD-10-CM | POA: Diagnosis not present

## 2022-05-29 LAB — CBC
HCT: 32.7 % — ABNORMAL LOW (ref 36.0–46.0)
Hemoglobin: 10.6 g/dL — ABNORMAL LOW (ref 12.0–15.0)
MCH: 31.9 pg (ref 26.0–34.0)
MCHC: 32.4 g/dL (ref 30.0–36.0)
MCV: 98.5 fL (ref 80.0–100.0)
Platelets: 471 10*3/uL — ABNORMAL HIGH (ref 150–400)
RBC: 3.32 MIL/uL — ABNORMAL LOW (ref 3.87–5.11)
RDW: 13.3 % (ref 11.5–15.5)
WBC: 9.1 10*3/uL (ref 4.0–10.5)
nRBC: 0 % (ref 0.0–0.2)

## 2022-05-29 LAB — RESPIRATORY PANEL BY PCR

## 2022-05-29 LAB — URINALYSIS, W/ REFLEX TO CULTURE (INFECTION SUSPECTED)
Bacteria, UA: NONE SEEN
Bilirubin Urine: NEGATIVE
Glucose, UA: NEGATIVE mg/dL
Hgb urine dipstick: NEGATIVE
Ketones, ur: NEGATIVE mg/dL
Leukocytes,Ua: NEGATIVE
Nitrite: NEGATIVE
Protein, ur: NEGATIVE mg/dL
Specific Gravity, Urine: 1.009 (ref 1.005–1.030)
pH: 6 (ref 5.0–8.0)

## 2022-05-29 LAB — COMPREHENSIVE METABOLIC PANEL
ALT: 33 U/L (ref 0–44)
AST: 36 U/L (ref 15–41)
Albumin: 2.8 g/dL — ABNORMAL LOW (ref 3.5–5.0)
Alkaline Phosphatase: 77 U/L (ref 38–126)
Anion gap: 13 (ref 5–15)
BUN: 17 mg/dL (ref 8–23)
CO2: 23 mmol/L (ref 22–32)
Calcium: 9.1 mg/dL (ref 8.9–10.3)
Chloride: 99 mmol/L (ref 98–111)
Creatinine, Ser: 1.13 mg/dL — ABNORMAL HIGH (ref 0.44–1.00)
GFR, Estimated: 47 mL/min — ABNORMAL LOW (ref >60)
Glucose, Bld: 81 mg/dL (ref 70–99)
Potassium: 4 mmol/L (ref 3.5–5.1)
Sodium: 135 mmol/L (ref 135–145)
Total Bilirubin: 0.6 mg/dL (ref 0.3–1.2)
Total Protein: 6.5 g/dL (ref 6.5–8.1)

## 2022-05-29 LAB — ECHOCARDIOGRAM COMPLETE
Area-P 1/2: 2.02 cm2
S' Lateral: 2.5 cm

## 2022-05-29 LAB — CREATININE, SERUM
Creatinine, Ser: 1.21 mg/dL — ABNORMAL HIGH (ref 0.44–1.00)
GFR, Estimated: 43 mL/min — ABNORMAL LOW (ref >60)

## 2022-05-29 LAB — PROCALCITONIN: Procalcitonin: 1.02 ng/mL

## 2022-05-29 LAB — C-REACTIVE PROTEIN: CRP: 3 mg/dL — ABNORMAL HIGH (ref <1.0)

## 2022-05-29 LAB — SEDIMENTATION RATE: Sed Rate: 119 mm/hr — ABNORMAL HIGH (ref 0–22)

## 2022-05-29 MED ORDER — LEVOTHYROXINE SODIUM 25 MCG PO TABS
125.0000 ug | ORAL_TABLET | Freq: Every day | ORAL | Status: DC
  Administered 2022-05-29 – 2022-05-30 (×2): 125 ug via ORAL
  Filled 2022-05-29 (×2): qty 1

## 2022-05-29 MED ORDER — ALBUTEROL SULFATE (2.5 MG/3ML) 0.083% IN NEBU: 2.5000 mg | INHALATION_SOLUTION | RESPIRATORY_TRACT | Status: DC | PRN

## 2022-05-29 MED ORDER — PRIMIDONE 50 MG PO TABS
200.0000 mg | ORAL_TABLET | Freq: Two times a day (BID) | ORAL | Status: DC
  Administered 2022-05-29 – 2022-05-30 (×2): 200 mg via ORAL
  Filled 2022-05-29 (×3): qty 4

## 2022-05-29 MED ORDER — LOSARTAN POTASSIUM 50 MG PO TABS
100.0000 mg | ORAL_TABLET | Freq: Every day | ORAL | Status: DC
  Administered 2022-05-29 – 2022-05-30 (×2): 100 mg via ORAL
  Filled 2022-05-29 (×2): qty 2

## 2022-05-29 MED ORDER — GUAIFENESIN ER 600 MG PO TB12
600.0000 mg | ORAL_TABLET | Freq: Two times a day (BID) | ORAL | Status: DC
  Administered 2022-05-29 – 2022-05-30 (×4): 600 mg via ORAL
  Filled 2022-05-29 (×4): qty 1

## 2022-05-29 MED ORDER — ASPIRIN 81 MG PO TBEC
81.0000 mg | DELAYED_RELEASE_TABLET | Freq: Every day | ORAL | Status: DC
  Administered 2022-05-29 – 2022-05-30 (×2): 81 mg via ORAL
  Filled 2022-05-29 (×2): qty 1

## 2022-05-29 MED ORDER — HEPARIN SODIUM (PORCINE) 5000 UNIT/ML IJ SOLN
5000.0000 [IU] | Freq: Three times a day (TID) | INTRAMUSCULAR | Status: DC
  Administered 2022-05-29 – 2022-05-30 (×3): 5000 [IU] via SUBCUTANEOUS
  Filled 2022-05-29 (×3): qty 1

## 2022-05-29 MED ORDER — AMOXICILLIN-POT CLAVULANATE 500-125 MG PO TABS
1.0000 | ORAL_TABLET | Freq: Two times a day (BID) | ORAL | Status: AC
  Administered 2022-05-29: 1 via ORAL
  Filled 2022-05-29: qty 1

## 2022-05-29 MED ORDER — ATORVASTATIN CALCIUM 40 MG PO TABS
40.0000 mg | ORAL_TABLET | Freq: Every day | ORAL | Status: DC
  Administered 2022-05-30: 40 mg via ORAL
  Filled 2022-05-29: qty 1

## 2022-05-29 MED ORDER — FLUTICASONE PROPIONATE 50 MCG/ACT NA SUSP
2.0000 | Freq: Every day | NASAL | Status: DC
  Administered 2022-05-29: 2 via NASAL
  Filled 2022-05-29: qty 16

## 2022-05-29 MED ORDER — ATROPINE SULFATE 1 MG/10ML IJ SOSY: 0.5000 mg | PREFILLED_SYRINGE | INTRAMUSCULAR | Status: DC | PRN

## 2022-05-29 NOTE — Progress Notes (Signed)
Per d/w Dr. Debara Pickett, will also keep NPO after MN in case we determine she does need EP consultation and pacer. Will also resume primidone per MD request.

## 2022-05-29 NOTE — Progress Notes (Signed)
Echocardiogram 2D Echocardiogram has been performed.  Oneal Deputy Redford Behrle RDCS 05/29/2022, 2:04 PM

## 2022-05-29 NOTE — Consult Note (Addendum)
Cardiology Consultation   Patient ID: Brandy Hicks Der Hicks MRN: [id number]; DOB: 07-Apr-1932  Admit date: 05/28/2022 Date of Consult: 05/29/2022  PCP:  Davina Poke, [place] Providers Cardiologist: New to Dr. Verne Spurr here to update MD or APP on Care Team, Refresh:1}     Patient Profile:   Brandy Hicks is a 89 y.o. female with a hx of HTN, essential tremor on propranolol, HLD, osteoporosis, hypothyroidism, Guillan-Barre, CKD stage 3a, recent admission for E Coli sepsis, former tobacco use/possible emphysema who is being seen 05/29/2022 for the evaluation of bradycardia at the request of Dr. Nigel Bridgeman.  History of Present Illness:   [name] has no known cardiac history. She was recently admitted 3/4-3/9 with septic shock due to E Coli UTI and strep pneumo PNA requiring pressors and antibiotics with hospital course notable for hyponatremia, abnormal LFTs, suppressed TSH requiring decrease in levothyroxine, urinary retention, AKI on CKD, hypoalbuminemia, hypokalemia, anemia, acute respiratory failure with CT showing aortic atherosclerosis, anasarca, and emphysema. Se received nebs and intermittent IV Lasix. Echo appeared unremarkable. She required home O2 at DC which was new.  She returned to the hospital by EMS reporting to be feeling faint. She has been on oral Augmentin. She had been doing OK post discharge but woke up feeling unwell. She was walking in the hallway at her ALF and had to sit down so EMS was called. EMS run sheet reviewed. Initial pulse 56, BP 148/52. In transient was also noted to have HR of 37 - tracings are challenging to fully discern given low P wave amplitude but appear to show a narrow complex rhythm at times NSR but other times with loss of P wave activity suggestive of junctional rhythm with occasional PACs. She received '1mg'$  atropine and IVF bolus with improvement in weakness. EKG and telemetry here suggest the same, admit  tracing 41bpm. She also has occasional sinus pauses up to 3.5 seconds and some variable P wave appearance suggestive of wandering atrial pacemaker as well. The lowest HR has been briefly in the high 20s overnight, unclear if awake or asleep at that time. Here, SBP has been 100s-140s. Labs show normal K, Cr 1.13, Hgb 10.6 c/w prior, TSH 5.325 with elevated free T4. Mg 1.5 subsequently repleted. 2D echo shows EF 60-65%, G1DD, mild LAE. Respiratory panel is positive for rhinovirus/enterovirus. CXR with chronic interstitial coarsening. She was on propranolol '60mg'$  daily which has been held, last dose yesterday at 10am. Otherwise she denies recent CP, SOB, LEE, syncope, fever, or chills. Does report ongoing cough since recent admission. She reports feeling better at the moment, HR 50s-60s, though last bradycardia in the 40s was around 12:53pm.  Per clarification with [facility], propranolol was last prescribed as the IR form.    Past Medical History:  Diagnosis Date   Chronic kidney disease, stage 3 unspecified (HCC)    Constipation, unspecified    Depression    Essential hypertension    Essential tremor    Guillain Barr syndrome ([facility])    Hormone replacement therapy    Hyperlipidemia    Hypertension    Hypothyroid    Hypothyroidism, unspecified    Impacted cerumen of both ears    Osteoporosis    Ovarian cyst    Radiculopathy, thoracic region    Retention of urine, unspecified    Rosacea    Seborrheic dermatitis, unspecified    Somnolence    Thoracic spine pain  TIA (transient ischemic attack)    Tremor    Unspecified mood (affective) disorder (HCC)     Past Surgical History:  Procedure Laterality Date   CHOLECYSTECTOMY N/A 01/03/2020   Procedure: LAPAROSCOPIC CHOLECYSTECTOMY;  Surgeon: Erroll Luna, MD;  Location: [facility];  Service: General;  Laterality: N/A;   FACIAL COSMETIC SURGERY     GALLBLADDER SURGERY     Post [place] Gallbladder Removal    HEEL SPUR SURGERY      HEEL SPUR SURGERY     TONSILLECTOMY       Home Medications:  Prior to Admission medications   Medication Sig Start Date End Date Taking? Authorizing Provider  amLODipine (NORVASC) 10 MG tablet Take 10 mg by mouth daily.   Yes [provider]  amoxicillin-clavulanate (AUGMENTIN) 500-125 MG tablet Take 1 tablet by mouth every 12 (twelve) hours for 5 days. 05/24/22 05/29/22 Yes Sheikh, Omair Latif, DO  aspirin 81 MG tablet Take 81 mg by mouth daily.   Yes [provider]  atorvastatin (LIPITOR) 40 MG tablet Take 1 tablet (40 mg total) by mouth daily. 05/30/22  Yes Sheikh, Omair Latif, DO  chlorthalidone (HYGROTEN) 15 MG tablet Take 1 tablet (15 mg total) by mouth daily. 05/24/22  Yes Sheikh, Omair Latif, DO  Cholecalciferol (VITAMIN D) 125 MCG (5000 UT) CAPS Take 5,000 Units by mouth daily.   Yes [provider]  feeding supplement (ENSURE ENLIVE / ENSURE PLUS) LIQD Take 237 mLs by mouth 3 (three) times daily between meals. Patient taking differently: Take 237 mLs by mouth 2 (two) times daily between meals. 05/24/22  Yes Sheikh, Omair Latif, DO  guaiFENesin (MUCINEX) 600 MG 12 hr tablet Take 1 tablet (600 mg total) by mouth 2 (two) times daily for 5 days. 05/24/22 05/29/22 Yes Sheikh, Omair Latif, DO  levothyroxine (SYNTHROID) 150 MCG tablet Take 150 mcg by mouth daily. 02/04/19  Yes [provider]  losartan (COZAAR) 100 MG tablet Take 100 mg by mouth daily.   Yes [provider]  Melatonin 10 MG TABS Take 10 mg by mouth at bedtime.    Yes [provider]  Multiple Vitamins-Minerals (MULTIVITAMIN ADULT PO) Take 1 tablet by mouth daily.   Yes [provider]  primidone (MYSOLINE) 50 MG tablet Take 200 mg by mouth 2 (two) times daily.   Yes [provider]  propranolol (INDERAL) 60 MG tablet Take 60 mg by mouth in the morning.   Yes [provider]  sertraline (ZOLOFT) 50 MG tablet Take 50 mg by mouth daily.  05/21/15  Yes  [provider]  fluticasone (FLONASE) 50 MCG/ACT nasal spray Place 2 sprays into both nostrils daily.    [provider]  ondansetron (ZOFRAN) 4 MG tablet Take 1 tablet (4 mg total) by mouth every 6 (six) hours as needed for nausea. Patient not taking: Reported on 05/28/2022 05/24/22   Raiford Noble Latif, DO  senna-docusate (SENOKOT-S) 8.6-50 MG tablet Take 1 tablet by mouth at bedtime as needed for mild constipation. Patient not taking: Reported on 05/28/2022 05/24/22   Kerney Elbe, DO  witch hazel-glycerin (TUCKS) pad Apply topically as needed for itching. Patient not taking: Reported on 05/28/2022 05/24/22   Kerney Elbe, DO    Inpatient Medications: Scheduled Meds:  aspirin EC  81 mg Oral Daily   [START ON 05/30/2022] atorvastatin  40 mg Oral Daily   fluticasone  2 spray Each Nare Daily   guaiFENesin  600 mg Oral BID   heparin  5,000 Units Subcutaneous Q8H   levothyroxine  125 mcg Oral Daily   losartan  100 mg Oral Daily   Continuous Infusions:  PRN Meds: albuterol, atropine  Allergies:    Allergies  Allergen Reactions   Hydroquinone     Pinkness and edema of face and eyelids, severe    Social History:   Social History   Socioeconomic History   Marital status: Widowed    Spouse name: Not on file   Number of children: Not on file   Years of education: Not on file   Highest education level: Not on file  Occupational History   Not on file  Tobacco Use   Smoking status: Former   Smokeless tobacco: Never  Vaping Use   Vaping Use: Never used  Substance and Sexual Activity   Alcohol use: No   Drug use: No   Sexual activity: Not Currently  Other Topics Concern   Not on file  Social History Narrative   Lives at alone in an apartment         Diet: Eat when hungry no special diet       Do you drink/ eat things with caffeine? Yes      Marital status:   Widowed                             What year were you married ? 1955      Do you  live in a house, apartment,assistred living, condo, trailer, etc.)? Independent Living       Is it one or more stories? Elevator      How many persons live in your home ? Self      Do you have any pets in your home ?(please list) 0      Highest Level of education completed: HS      Current or past profession: [employer]      Do you exercise?  No                            Type & how often Blank      ADVANCED DIRECTIVES (Please bring copies)      Do you have a living will? Yes      Do you have a DNR form?  Yes                     If not, do you want to discuss one?       Do you have signed POA?HPOA forms?  Yes               If so, please bring to your appointment      FUNCTIONAL STATUS- To be completed by Spouse / child / Staff       Do you have difficulty bathing or dressing yourself ? No      Do you have difficulty preparing food or eating ?  No      Do you have difficulty managing your mediation ?  No      Do you have difficulty managing your finances ?  No (But granddaughter handles)       Do you have difficulty affording your medication ?  No         Social Determinants of Health   Financial Resource Strain: Not on file  Food Insecurity: No Food Insecurity (05/19/2022)   Hunger Vital  Sign    Worried About Charity fundraiser in the Last Year: Never true    [place] in the Last Year: Never true  Transportation Needs: No Transportation Needs (05/19/2022)   PRAPARE - Hydrologist (Medical): No    Lack of Transportation (Non-Medical): No  Physical Activity: Not on file  Stress: Not on file  Social Connections: Not on file  Intimate Partner Violence: Not At Risk (05/19/2022)   Humiliation, Afraid, Rape, and Kick questionnaire    Fear of Current or Ex-Partner: No    Emotionally Abused: No    Physically Abused: No    Sexually Abused: No    Family History:    Family History  Problem Relation Age of Onset   Stroke Mother        CABG  age 57   CAD Mother    Hypertension Father    CAD Father    Prostate cancer Father    Liver disease Sister    Hypertension Daughter    Thyroid disease Daughter    Breast cancer Daughter    Heart murmur Daughter    Hypertension Daughter    Multiple sclerosis Daughter    Kidney disease Daughter      ROS:  Please see the history of present illness.  All other ROS reviewed and negative.     Physical Exam/Data:   Vitals:   05/29/22 1000 05/29/22 1021 05/29/22 1330 05/29/22 1409  BP: (!) 149/44  (!) 101/54   Pulse: (!) 51  (!) 57   Resp: (!) 7  18   Temp:  98.3 F (36.8 C)  98.1 F (36.7 C)  TempSrc:  Oral  Oral  SpO2: 100%  100%    No intake or output data in the 24 hours ending 05/29/22 1512    05/21/2022   11:44 AM 05/20/2022    5:00 AM 05/19/2022   11:21 AM  Last 3 Weights  Weight (lbs) 128 lb 12 oz 132 lb 15 oz 115 lb  Weight (kg) 58.4 kg 60.3 kg 52.164 kg     There is no height or weight on file to calculate BMI.  General: Well developed, well nourished, in no acute distress. Head: Normocephalic, atraumatic, sclera non-icteric, no xanthomas, nares are without discharge. Neck: Negative for carotid bruits. JVP not elevated. Lungs: DIminished BS throughout without wheezes, rales, or rhonchi. Breathing is unlabored. Heart: RRR S1 S2 without murmurs, rubs, or gallops.  Abdomen: Soft, non-tender, non-distended with normoactive bowel sounds. No rebound/guarding. Extremities: No clubbing or cyanosis. No edema. Distal pedal pulses are 2+ and equal bilaterally. Neuro: Alert and oriented X 3. Moves all extremities spontaneously. Slightly HOH. Psych:  Responds to questions appropriately with a normal affect.   EKG:  The EKG was personally reviewed and demonstrates:  narrow complex rhythm 41bpm with intermittent sinus rhythm and junctional beating Telemetry:  Telemetry was personally reviewed and demonstrates:  described above  Relevant CV Studies: 2D echo today    1. Left  ventricular ejection fraction, by estimation, is 60 to 65%. The  left ventricle has normal function. The left ventricle has no regional  wall motion abnormalities. Left ventricular diastolic parameters are  consistent with Grade I diastolic  dysfunction (impaired relaxation).   2. Right ventricular systolic function is normal. The right ventricular  size is normal.   3. Left atrial size was mildly dilated.   4. The mitral valve is normal in structure. Trivial mitral valve  regurgitation. No evidence of mitral stenosis. Moderate mitral annular  calcification.   5. The aortic valve is tricuspid. There is moderate calcification of the  aortic valve. Aortic valve regurgitation is not visualized. Aortic valve  sclerosis/calcification is present, without any evidence of aortic  stenosis.   6. The inferior vena cava is normal in size with greater than 50%  respiratory variability, suggesting right atrial pressure of 3 mmHg.   Laboratory Data:  High Sensitivity Troponin:   Recent Labs  Lab 05/28/22 1752 05/28/22 2000  TROPONINIHS 17 14     Chemistry Recent Labs  Lab 05/23/22 0551 05/24/22 0647 05/28/22 1752 05/29/22 0318  NA 131* 132* 135 135  K 3.3* 4.0 3.9 4.0  CL 93* 96* 104 99  CO2 '27 28 22 23  '$ GLUCOSE 92 94 104* 81  BUN '18 18 18 17  '$ CREATININE 1.13* 1.10* 1.14* 1.13*  1.21*  CALCIUM 8.2* 8.3* 8.2* 9.1  MG 1.7  --  1.5*  --   GFRNONAA 47* 49* 47* 47*  43*  ANIONGAP '11 8 9 13    '$ Recent Labs  Lab 05/23/22 0551 05/28/22 1752 05/29/22 0318  PROT 6.1* 6.1* 6.5  ALBUMIN 2.7* 2.5* 2.8*  AST 84* 35 36  ALT 53* 34 33  ALKPHOS 86 74 77  BILITOT 0.5 0.4 0.6   Lipids No results for input(s): "CHOL", "TRIG", "HDL", "LABVLDL", "LDLCALC", "CHOLHDL" in the last 168 hours.  Hematology Recent Labs  Lab 05/24/22 0647 05/28/22 1752 05/29/22 0318  WBC 7.6 9.0 9.1  RBC 3.33* 3.08* 3.32*  HGB 10.7* 10.1* 10.6*  HCT 31.7* 30.0* 32.7*  MCV 95.2 97.4 98.5  MCH 32.1 32.8 31.9   MCHC 33.8 33.7 32.4  RDW 13.5 13.3 13.3  PLT 213 378 471*   Thyroid  Recent Labs  Lab 05/28/22 2222  TSH 5.107*  FREET4 1.20*    BNP Recent Labs  Lab 05/28/22 1752  BNP 228.7*    DDimer No results for input(s): "DDIMER" in the last 168 hours.   Radiology/Studies:  ECHOCARDIOGRAM COMPLETE  Result Date: 05/29/2022    ECHOCARDIOGRAM REPORT   Patient Name:   ZANYLA JORDE DER Hicks Date of Exam: 05/29/2022 Medical Rec #:  [id number]               Height:       61.0 in Accession #:    [id number]              Weight:       128.7 lb Date of Birth:  10/31/1932                BSA:          1.566 m Patient Age:    38 years                BP:           149/44 mmHg Patient Gender: F                       HR:           55 bpm. Exam Location:  Inpatient Procedure: 2D Echo, Color Doppler and Cardiac Doppler Indications:    "Other abnormalities of the heart"  History:        Patient has prior history of Echocardiogram examinations, most                 recent 05/20/2022. Risk Factors:Hypertension.  Sonographer:  [place] Referring Phys: [id number] [place]  1. Left ventricular ejection fraction, by estimation, is 60 to 65%. The left ventricle has normal function. The left ventricle has no regional wall motion abnormalities. Left ventricular diastolic parameters are consistent with Grade I diastolic dysfunction (impaired relaxation).  2. Right ventricular systolic function is normal. The right ventricular size is normal.  3. Left atrial size was mildly dilated.  4. The mitral valve is normal in structure. Trivial mitral valve regurgitation. No evidence of mitral stenosis. Moderate mitral annular calcification.  5. The aortic valve is tricuspid. There is moderate calcification of the aortic valve. Aortic valve regurgitation is not visualized. Aortic valve sclerosis/calcification is present, without any evidence of aortic stenosis.  6. The inferior vena cava is normal in size with  greater than 50% respiratory variability, suggesting right atrial pressure of 3 mmHg. FINDINGS  Left Ventricle: Left ventricular ejection fraction, by estimation, is 60 to 65%. The left ventricle has normal function. The left ventricle has no regional wall motion abnormalities. The left ventricular internal cavity size was normal in size. There is  no left ventricular hypertrophy. Left ventricular diastolic parameters are consistent with Grade I diastolic dysfunction (impaired relaxation). Right Ventricle: The right ventricular size is normal. No increase in right ventricular wall thickness. Right ventricular systolic function is normal. Left Atrium: Left atrial size was mildly dilated. Right Atrium: Right atrial size was normal in size. Pericardium: There is no evidence of pericardial effusion. Mitral Valve: The mitral valve is normal in structure. Moderate mitral annular calcification. Trivial mitral valve regurgitation. No evidence of mitral valve stenosis. Tricuspid Valve: The tricuspid valve is normal in structure. Tricuspid valve regurgitation is not demonstrated. No evidence of tricuspid stenosis. Aortic Valve: The aortic valve is tricuspid. There is moderate calcification of the aortic valve. Aortic valve regurgitation is not visualized. Aortic valve sclerosis/calcification is present, without any evidence of aortic stenosis. Pulmonic Valve: The pulmonic valve was not well visualized. Pulmonic valve regurgitation is not visualized. No evidence of pulmonic stenosis. Aorta: The aortic root is normal in size and structure. Venous: The inferior vena cava is normal in size with greater than 50% respiratory variability, suggesting right atrial pressure of 3 mmHg. IAS/Shunts: No atrial level shunt detected by color flow Doppler.  LEFT VENTRICLE PLAX 2D LVIDd:         3.90 cm   Diastology LVIDs:         2.50 cm   LV e' medial:    7.51 cm/s LV PW:         1.00 cm   LV E/e' medial:  11.9 LV IVS:        0.80 cm   LV e'  lateral:   7.83 cm/s LVOT diam:     1.80 cm   LV E/e' lateral: 11.4 LV SV:         64 LV SV Index:   41 LVOT Area:     2.54 cm  RIGHT VENTRICLE RV S prime:     13.30 cm/s TAPSE (M-mode): 2.0 cm LEFT ATRIUM             Index        RIGHT ATRIUM           Index LA diam:        3.50 cm 2.23 cm/m   RA Area:     10.60 cm LA Vol (A2C):   54.5 ml 34.80 ml/m  RA Volume:   22.20  ml  14.17 ml/m LA Vol (A4C):   41.2 ml 26.30 ml/m LA Biplane Vol: 50.1 ml 31.99 ml/m  AORTIC VALVE LVOT Vmax:   113.00 cm/s LVOT Vmean:  75.100 cm/s LVOT VTI:    0.252 m  AORTA Ao Root diam: 2.80 cm MITRAL VALVE MV Area (PHT): 2.02 cm     SHUNTS MV Decel Time: 376 msec     Systemic VTI:  0.25 m MV E velocity: 89.60 cm/s   Systemic Diam: 1.80 cm MV A velocity: 110.00 cm/s MV E/A ratio:  0.81 Glori Bickers MD Electronically signed by Glori Bickers MD Signature Date/Time: 05/29/2022/2:10:49 PM    Final    DG Chest Port 1 View  Result Date: 05/28/2022 CLINICAL DATA:  Bradycardia. EXAM: PORTABLE CHEST 1 VIEW COMPARISON:  05/24/2022 FINDINGS: Lungs are hyperexpanded. The lungs are clear without focal pneumonia, edema, pneumothorax or pleural effusion. Interstitial markings are diffusely coarsened with chronic features. Cardiopericardial silhouette is at upper limits of normal for size. The visualized bony structures of the thorax are unremarkable. Telemetry leads overlie the chest. IMPRESSION: Hyperexpansion with chronic interstitial coarsening. No acute cardiopulmonary findings. Electronically Signed   By: Misty Stanley M.D.   On: 05/28/2022 18:47     Assessment and Plan:   1. Weakness suspected due in part to symptomatic bradycardia - on propranolol '60mg'$  IR daily PTA, last dose 10am yesterday - continue to hold BB - would allow permissive HTN while bradycardic - amlodipine and chlorthalidone on hold - will review plan with MD but anticipate continued BB washout period and if conduction does not improve needs EP consultation for  PPM - magnesium repleted with plan for recheck in AM  2. Hypothyroidism with recent levothyroxine adjustment - recent decrease in dose last admission due to suppressed TSH - f/u TFTs somewhat discordant with both elevated TSH and free T4 - further per IM  3. Rhinovirus/enterovirus + - recent strep pneumo PNA, E Coli UTI, hypoxic respiratory failure, suspected underlying emphysema - afebrile  4. Essential HTN - BP variable, low readings may also be contributing to generalized weakness - follow in context above   Risk Assessment/Risk Scores:  N/A      For questions or updates, please contact [facility] Please consult [email or web address] for contact info under    Signed, Charlie Pitter, PA-C  05/29/2022 3:12 PM

## 2022-05-29 NOTE — ED Notes (Signed)
ED TO INPATIENT HANDOFF REPORT  ED Nurse Name and Phone #: [phone]   S Name/Age/Gender Brandy Hicks 89 y.o. female Room/Bed: 011C/011C  Code Status   Code Status: DNR  Home/SNF/Other Home Patient oriented to: self, place, time, and situation Is this baseline? Yes   Triage Complete: Triage complete  Chief Complaint Bradycardia [R00.1]  Triage Note Pt normally ambulatory without walker, woke up from nap today and was dizzy and weak. Initial heart rate 34 via pulse ox. EKG by EMS shows SB. '1mg'$  atropine and 400cc NS given by EMS with improvement. Patient denies complaints on arrival.    Allergies Allergies  Allergen Reactions   Hydroquinone     Pinkness and edema of face and eyelids, severe    Level of Care/Admitting Diagnosis ED Disposition     ED Disposition  Admit   Condition  --   [place]: [place] [[id number]]  Level of Care: Progressive [102]  Admit to Progressive based on following criteria: CARDIOVASCULAR & THORACIC of moderate stability with acute coronary syndrome symptoms/low risk myocardial infarction/hypertensive urgency/arrhythmias/heart failure potentially compromising stability and stable post cardiovascular intervention patients.  May admit patient to [facility] or [facility] if equivalent level of care is available:: No  Covid Evaluation: Symptomatic Person Under Investigation (PUI) or recent exposure (last 10 days) *Testing Required*  Diagnosis: Bradycardia [id number]  Admitting Physician: Clance Boll [id number]  Attending Physician: Clance Boll [id number]  Certification:: I certify this patient will need inpatient services for at least 2 midnights  Estimated Length of Stay: 3          B Medical/Surgery History Past Medical History:  Diagnosis Date   Chronic kidney disease, stage 3 unspecified ([place])    Chronic kidney disease, unspecified    Constipation, unspecified    Depression     Essential hypertension    Essential tremor    Guillain Barr syndrome ([place])    Hormone replacement therapy    Hyperlipidemia    Hypertension    Hypothyroid    Hypothyroidism, unspecified    Impacted cerumen of both ears    Impacted cerumen of right ear    Osteoporosis    Ovarian cyst    Radiculopathy, thoracic region    Retention of urine, unspecified    Rosacea    Seborrheic dermatitis, unspecified    Somnolence    Thoracic spine pain    TIA (transient ischemic attack)    Tremor    Unspecified mood (affective) disorder ([place])    Past Surgical History:  Procedure Laterality Date   CHOLECYSTECTOMY N/A 01/03/2020   Procedure: LAPAROSCOPIC CHOLECYSTECTOMY;  Surgeon: Erroll Luna, MD;  Location: [place];  Service: General;  Laterality: N/A;   FACIAL COSMETIC SURGERY     GALLBLADDER SURGERY     Post [place] Gallbladder Removal    HEEL SPUR SURGERY     HEEL SPUR SURGERY     TONSILLECTOMY       A IV Location/Drains/Wounds Patient Lines/Drains/Airways Status     Active Line/Drains/Airways     Name Placement date Placement time Site Days   Peripheral IV 05/28/22 20 G Left Antecubital 05/28/22  2256  Antecubital  1   Peripheral IV 05/29/22 20 G 1.88" Anterior;Right Forearm 05/29/22  0324  Forearm  less than 1            Intake/Output Last 24 hours No intake or output data in the 24 hours ending 05/29/22 1330  Labs/Imaging  Results for orders placed or performed during the hospital encounter of 05/28/22 (from the past 48 hour(s))  CBC with Differential     Status: Abnormal   Collection Time: 05/28/22  5:52 PM  Result Value Ref [place]   WBC 9.0 4.0 - 10.5 K/uL   RBC 3.08 (L) 3.87 - 5.11 MIL/uL   Hemoglobin 10.1 (L) 12.0 - 15.0 g/dL   HCT 30.0 (L) 36.0 - 46.0 %   MCV 97.4 80.0 - 100.0 fL   MCH 32.8 26.0 - 34.0 pg   MCHC 33.7 30.0 - 36.0 g/dL   RDW 13.3 11.5 - 15.5 %   Platelets 378 150 - 400 K/uL   nRBC 0.0 0.0 - 0.2 %   Neutrophils Relative % 70 %   Neutro Abs  6.3 1.7 - 7.7 K/uL   Lymphocytes Relative 21 %   Lymphs Abs 1.9 0.7 - 4.0 K/uL   Monocytes Relative 6 %   Monocytes Absolute 0.5 0.1 - 1.0 K/uL   Eosinophils Relative 1 %   Eosinophils Absolute 0.1 0.0 - 0.5 K/uL   Basophils Relative 1 %   Basophils Absolute 0.1 0.0 - 0.1 K/uL   Immature Granulocytes 1 %   Abs Immature Granulocytes 0.10 (H) 0.00 - 0.07 K/uL    Comment: Performed at [facility], [address], [place], [place] [address]  Comprehensive metabolic panel     Status: Abnormal   Collection Time: 05/28/22  5:52 PM  Result Value Ref [place]   Sodium 135 135 - 145 mmol/L   Potassium 3.9 3.5 - 5.1 mmol/L   Chloride 104 98 - 111 mmol/L   CO2 22 22 - 32 mmol/L   Glucose, Bld 104 (H) 70 - 99 mg/dL    Comment: Glucose reference range applies only to samples taken after fasting for at least 8 hours.   BUN 18 8 - 23 mg/dL   Creatinine, Ser 1.14 (H) 0.44 - 1.00 mg/dL   Calcium 8.2 (L) 8.9 - 10.3 mg/dL   Total Protein 6.1 (L) 6.5 - 8.1 g/dL   Albumin 2.5 (L) 3.5 - 5.0 g/dL   AST 35 15 - 41 U/L   ALT 34 0 - 44 U/L   Alkaline Phosphatase 74 38 - 126 U/L   Total Bilirubin 0.4 0.3 - 1.2 mg/dL   GFR, Estimated 47 (L) >60 mL/min    Comment: (NOTE) Calculated using the CKD-EPI Creatinine Equation (2021)    Anion gap 9 5 - 15    Comment: Performed at [facility], [place] [address] [place], [place] [address]  Troponin I (High Sensitivity)     Status: None   Collection Time: 05/28/22  5:52 PM  Result Value Ref [place]   Troponin I (High Sensitivity) 17 <18 ng/L    Comment: (NOTE) Elevated high sensitivity troponin I (hsTnI) values and significant  changes across serial measurements may suggest ACS but many other  chronic and acute conditions are known to elevate hsTnI results.  Refer to the "Links" section for chest pain algorithms and additional  guidance. Performed at [facility], [place] [address], [place], [place] [address]   Brain natriuretic peptide     Status:  Abnormal   Collection Time: 05/28/22  5:52 PM  Result Value Ref [place]   B Natriuretic Peptide 228.7 (H) 0.0 - 100.0 pg/mL    Comment: Performed at [facility] [address] [place], [place] [address]  Magnesium     Status:  Abnormal   Collection Time: 05/28/22  5:52 PM  Result Value Ref [place]   Magnesium 1.5 (L) 1.7 - 2.4 mg/dL    Comment: Performed at [facility] [address], [place], [place] [address]  TSH     Status: Abnormal   Collection Time: 05/28/22  5:52 PM  Result Value Ref [place]   TSH 5.325 (H) 0.350 - 4.500 uIU/mL    Comment: Performed by a 3rd Generation assay with a functional sensitivity of <=0.01 uIU/mL. Performed at [facility], [place] [address] [place], [place] [address]   Troponin I (High Sensitivity)     Status: None   Collection Time: 05/28/22  8:00 PM  Result Value Ref [place]   Troponin I (High Sensitivity) 14 <18 ng/L    Comment: (NOTE) Elevated high sensitivity troponin I (hsTnI) values and significant  changes across serial measurements may suggest ACS but many other  chronic and acute conditions are known to elevate hsTnI results.  Refer to the "Links" section for chest pain algorithms and additional  guidance. Performed at [facility], [place] [address] [place], [place] [address]   T4, free     Status: Abnormal   Collection Time: 05/28/22 10:22 PM  Result Value Ref [place]   Free T4 1.20 (H) 0.61 - 1.12 ng/dL    Comment: (NOTE) Biotin ingestion may interfere with free T4 tests. If the results are inconsistent with the TSH level, previous test results, or the clinical presentation, then consider biotin interference. If needed, order repeat testing after stopping biotin. Performed at [facility], [place] [address] [place], [place] [address]   TSH     Status: Abnormal   Collection Time: 05/28/22 10:22 PM  Result Value Ref [place]   TSH 5.107 (H) 0.350 - 4.500 uIU/mL    Comment: Performed by a 3rd Generation assay with  a functional sensitivity of <=0.01 uIU/mL. Performed at [facility], [place] [address], [place], [place] [address]   Culture, blood (Routine X 2) w Reflex to ID Panel     Status: None (Preliminary result)   Collection Time: 05/29/22  2:10 AM   Specimen: BLOOD RIGHT FOREARM  Result Value Ref [place]   Specimen Description BLOOD RIGHT FOREARM    Special Requests      BOTTLES DRAWN AEROBIC AND ANAEROBIC Blood Culture adequate volume   Culture      NO GROWTH < 12 HOURS Performed at [facility], [place] [address], [place],  [address]    Report Status PENDING   Procalcitonin     Status: None   Collection Time: 05/29/22  3:18 AM  Result Value Ref [place]   Procalcitonin 1.02 ng/mL    Comment:        Interpretation: PCT > 0.5 ng/mL and <= 2 ng/mL: Systemic infection (sepsis) is possible, but other conditions are known to elevate PCT as well. (NOTE)       Sepsis PCT Algorithm           Lower Respiratory Tract                                      Infection PCT Algorithm    ----------------------------     ----------------------------         PCT < 0.25 ng/mL  PCT < 0.10 ng/mL          Strongly encourage             Strongly discourage   discontinuation of antibiotics    initiation of antibiotics    ----------------------------     -----------------------------       PCT 0.25 - 0.50 ng/mL            PCT 0.10 - 0.25 ng/mL               OR       >80% decrease in PCT            Discourage initiation of                                            antibiotics      Encourage discontinuation           of antibiotics    ----------------------------     -----------------------------         PCT >= 0.50 ng/mL              PCT 0.26 - 0.50 ng/mL                AND       <80% decrease in PCT             Encourage initiation of                                             antibiotics       Encourage continuation           of antibiotics    ----------------------------      -----------------------------        PCT >= 0.50 ng/mL                  PCT > 0.50 ng/mL               AND         increase in PCT                  Strongly encourage                                      initiation of antibiotics    Strongly encourage escalation           of antibiotics                                     -----------------------------                                           PCT <= 0.25 ng/mL                                                 OR                                        >  80% decrease in PCT                                      Discontinue / Do not initiate                                             antibiotics  Performed at [facility], [place] [address] [place], [place] [address]   C-reactive protein     Status: Abnormal   Collection Time: 05/29/22  3:18 AM  Result Value Ref [place]   CRP 3.0 (H) <1.0 mg/dL    Comment: Performed at [facility] [address], [place], [place] [address]  CBC     Status: Abnormal   Collection Time: 05/29/22  3:18 AM  Result Value Ref [place]   WBC 9.1 4.0 - 10.5 K/uL   RBC 3.32 (L) 3.87 - 5.11 MIL/uL   Hemoglobin 10.6 (L) 12.0 - 15.0 g/dL   HCT 32.7 (L) 36.0 - 46.0 %   MCV 98.5 80.0 - 100.0 fL   MCH 31.9 26.0 - 34.0 pg   MCHC 32.4 30.0 - 36.0 g/dL   RDW 13.3 11.5 - 15.5 %   Platelets 471 (H) 150 - 400 K/uL   nRBC 0.0 0.0 - 0.2 %    Comment: Performed at [facility], [place] [address] [place], [place] [address]  Creatinine, serum     Status: Abnormal   Collection Time: 05/29/22  3:18 AM  Result Value Ref [place]   Creatinine, Ser 1.21 (H) 0.44 - 1.00 mg/dL   GFR, Estimated 43 (L) >60 mL/min    Comment: (NOTE) Calculated using the CKD-EPI Creatinine Equation (2021) Performed at [facility] [address], [place], [place] [address]   Comprehensive metabolic panel     Status: Abnormal   Collection Time: 05/29/22  3:18 AM  Result Value Ref [place]   Sodium 135 135 - 145 mmol/L   Potassium  4.0 3.5 - 5.1 mmol/L   Chloride 99 98 - 111 mmol/L   CO2 23 22 - 32 mmol/L   Glucose, Bld 81 70 - 99 mg/dL    Comment: Glucose reference range applies only to samples taken after fasting for at least 8 hours.   BUN 17 8 - 23 mg/dL   Creatinine, Ser 1.13 (H) 0.44 - 1.00 mg/dL   Calcium 9.1 8.9 - 10.3 mg/dL   Total Protein 6.5 6.5 - 8.1 g/dL   Albumin 2.8 (L) 3.5 - 5.0 g/dL   AST 36 15 - 41 U/L   ALT 33 0 - 44 U/L   Alkaline Phosphatase 77 38 - 126 U/L   Total Bilirubin 0.6 0.3 - 1.2 mg/dL   GFR, Estimated 47 (L) >60 mL/min    Comment: (NOTE) Calculated using the CKD-EPI Creatinine Equation (2021)    Anion gap 13 5 - 15    Comment: Performed at [facility] [address], [place], [place] [address]  Sedimentation rate     Status: Abnormal   Collection Time: 05/29/22  3:18 AM  Result Value Ref [place]   Sed Rate 119 (H) 0 - 22 mm/hr    Comment: Performed at [facility] [address], [place], [place] [address]  Culture, blood (  Routine X 2) w Reflex to ID Panel     Status: None (Preliminary result)   Collection Time: 05/29/22  3:18 AM   Specimen: BLOOD  Result Value Ref [place]   Specimen Description BLOOD SITE NOT SPECIFIED    Special Requests      BOTTLES DRAWN AEROBIC AND ANAEROBIC Blood Culture adequate volume   Culture      NO GROWTH < 12 HOURS Performed at [facility], [address] [place], [place] [address]    Report Status PENDING   Respiratory (~20 pathogens) panel by PCR     Status: Abnormal   Collection Time: 05/29/22  3:25 AM   Specimen: Urine, Clean Catch; Respiratory  Result Value Ref [place]   Adenovirus NOT DETECTED NOT DETECTED   Coronavirus 229E NOT DETECTED NOT DETECTED    Comment: (NOTE) The Coronavirus on the Respiratory Panel, DOES NOT test for the novel  Coronavirus (2019 nCoV)    Coronavirus HKU1 NOT DETECTED NOT DETECTED   Coronavirus NL63 NOT DETECTED NOT DETECTED   Coronavirus OC43 NOT DETECTED NOT DETECTED    Metapneumovirus NOT DETECTED NOT DETECTED   Rhinovirus / Enterovirus DETECTED (A) NOT DETECTED   Influenza A NOT DETECTED NOT DETECTED   Influenza B NOT DETECTED NOT DETECTED   Parainfluenza Virus 1 NOT DETECTED NOT DETECTED   Parainfluenza Virus 2 NOT DETECTED NOT DETECTED   Parainfluenza Virus 3 NOT DETECTED NOT DETECTED   Parainfluenza Virus 4 NOT DETECTED NOT DETECTED   Respiratory Syncytial Virus NOT DETECTED NOT DETECTED   Bordetella pertussis NOT DETECTED NOT DETECTED   Bordetella Parapertussis NOT DETECTED NOT DETECTED   Chlamydophila pneumoniae NOT DETECTED NOT DETECTED   Mycoplasma pneumoniae NOT DETECTED NOT DETECTED    Comment: Performed at [facility], [place] [address], [place], [place] [address]  Urinalysis, w/ Reflex to Culture (Infection Suspected) -Urine, Clean Catch     Status: None   Collection Time: 05/29/22  3:28 AM  Result Value Ref [place]   Specimen Source URINE, CLEAN CATCH    Color, Urine YELLOW YELLOW   APPearance CLEAR CLEAR   Specific Gravity, Urine 1.009 1.005 - 1.030   pH 6.0 5.0 - 8.0   Glucose, UA NEGATIVE NEGATIVE mg/dL   Hgb urine dipstick NEGATIVE NEGATIVE   Bilirubin Urine NEGATIVE NEGATIVE   Ketones, ur NEGATIVE NEGATIVE mg/dL   Protein, ur NEGATIVE NEGATIVE mg/dL   Nitrite NEGATIVE NEGATIVE   Leukocytes,Ua NEGATIVE NEGATIVE   RBC / HPF 0-5 0 - 5 RBC/hpf   WBC, UA 0-5 0 - 5 WBC/hpf    Comment:        Reflex urine culture not performed if WBC <=10, OR if Squamous epithelial cells >5. If Squamous epithelial cells >5 suggest recollection.    Bacteria, UA NONE SEEN NONE SEEN   Squamous Epithelial / HPF 0-5 0 - 5 /HPF    Comment: Performed at [facility], [place] [address] [place], [place] [address]   DG Chest Port 1 View  Result Date: 05/28/2022 CLINICAL DATA:  Bradycardia. EXAM: PORTABLE CHEST 1 VIEW COMPARISON:  05/24/2022 FINDINGS: Lungs are hyperexpanded. The lungs are clear without focal pneumonia, edema, pneumothorax or  pleural effusion. Interstitial markings are diffusely coarsened with chronic features. Cardiopericardial silhouette is at upper limits of normal for size. The visualized bony structures of the thorax are unremarkable. Telemetry leads overlie the chest. IMPRESSION: Hyperexpansion with chronic interstitial coarsening. No acute cardiopulmonary findings. Electronically Signed   By: Randall Hiss  Tery Sanfilippo M.D.   On: 05/28/2022 18:47    Pending Labs Unresulted Labs (From admission, onward)     Start     Ordered   05/28/22 2146  T3, free  Once,   URGENT        05/28/22 2145   05/28/22 1813  Urinalysis, Routine w reflex microscopic -Urine, Clean Catch  Once,   URGENT       Question:  Specimen Source  Answer:  Urine, Clean Catch   05/28/22 1812            Vitals/Pain Today's Vitals   05/29/22 0634 05/29/22 0700 05/29/22 1000 05/29/22 1021  BP:   (!) 149/44   Pulse:  (!) 49 (!) 51   Resp:  15 (!) 7   Temp: 98.4 F (36.9 C)   98.3 F (36.8 C)  TempSrc:    Oral  SpO2:  100% 100%   PainSc:        Isolation Precautions Droplet precaution  Medications Medications  aspirin EC tablet 81 mg (81 mg Oral Given 05/29/22 1003)  atorvastatin (LIPITOR) tablet 40 mg (has no administration in time range)  losartan (COZAAR) tablet 100 mg (100 mg Oral Given 05/29/22 1003)  levothyroxine (SYNTHROID) tablet 125 mcg (125 mcg Oral Given 05/29/22 0552)  guaiFENesin (MUCINEX) 12 hr tablet 600 mg (600 mg Oral Given 05/29/22 1003)  fluticasone (FLONASE) 50 MCG/ACT nasal spray 2 spray (2 sprays Each Nare Given 05/29/22 1007)  heparin injection 5,000 Units (5,000 Units Subcutaneous Given 05/29/22 1314)  albuterol (PROVENTIL) (2.5 MG/3ML) 0.083% nebulizer solution 2.5 mg (has no administration in time range)  atropine 1 MG/10ML injection 0.5 mg (has no administration in time range)  magnesium sulfate IVPB 4 g 100 mL (0 g Intravenous Stopped 05/29/22 0058)  amoxicillin-clavulanate (AUGMENTIN) 500-125 MG per tablet 1 tablet  (1 tablet Oral Given 05/29/22 0321)    Mobility walks with device     Focused Assessments Cardiac Assessment Handoff:  Cardiac Rhythm: Normal sinus rhythm, Sinus bradycardia Lab Results  Component Value Date   CKTOTAL 118 05/19/2022   No results found for: "DDIMER" Does the Patient currently have chest pain? No    R Recommendations: See Admitting Provider Note  Report given to:   Additional Notes:

## 2022-05-29 NOTE — Progress Notes (Signed)
PROGRESS NOTE    Brandy Hicks  [id number] DOB: [birth year] DOA: 05/28/2022 PCP: Davina Poke, FNP    Brief Narrative:  89 year old female with history of hypertension, hyper lipidemia, hypothyroidism, GB syndrome over 5 years ago, CKD stage IIIb and recent hospitalization 3/4-3/9 with diagnosis of sepsis due to UTI, strep pneumonia and discharged home on oral antibiotics returns to the emergency room with feeling faint and having low blood pressure, heart rate in 30s in the.  Necessitating 1 dose of atropine.  In the emergency room hemodynamically stable.  Magnesium 1.5.  Electrolytes are normal.  Renal functions at baseline.  Chest x-ray with hyperexpansion of chronic interstitial coarsening. Respiratory virus panel positive for rhinovirus.  COVID, influenza and RSV negative.   Assessment & Plan:   Symptomatic bradycardia in the setting of hypomagnesemia and propranolol use and rhinovirus infection: Monitor.  So far remains stable.  Cardiac enzymes are flat.  No acute ST-T wave changes.  Replace electrolytes.  Hold all rate control medications including propranolol.  Cardiology to see. Atropine at bedside, likely she will not need any further.  Hypomagnesemia: Replaced.  Adequate.  Essential hypertension: Blood pressures are adequate.  Amlodipine, chlorthalidone and propranolol on hold.  Losartan was continued.  Will monitor.  History of E. coli UTI, strep pneumonia: Recent history.  Treated.  Improved.  Hypothyroidism: Recent TSH 0.18.  Synthroid dose was decreased to 125 mcg daily.  Continue.  Rhinovirus infection with underlying COPD: Symptomatic management.   DVT prophylaxis: heparin injection 5,000 Units Start: 05/29/22 1400   Code Status: DNR Family Communication: Granddaughter on the phone Disposition Plan: Status is: Inpatient Remains inpatient appropriate because: Significant symptoms, bradycardia     Consultants:  Cardiology  Procedures:   None  Antimicrobials:  Completed   Subjective: Patient seen in the morning rounds.  She was in the emergency room.  Patient tells me that she just felt weak.  Did not lose consciousness.  Denies any chest pain or palpitations.  Telemetry monitor with sinus rhythm and heart rate is mostly more than 45.  Denies any cough congestion or flulike symptoms.  Objective: Vitals:   05/29/22 0600 05/29/22 0630 05/29/22 0634 05/29/22 0700  BP: (!) 124/37 (!) 119/40    Pulse: (!) 44 (!) 48  (!) 49  Resp: '15 14  15  '$ Temp:   98.4 F (36.9 C)   TempSrc:      SpO2: 100% 100%  100%   No intake or output data in the 24 hours ending 05/29/22 0749 There were no vitals filed for this visit.  Examination:  General: Frail.  Appropriate for age.  Not in any distress.  Alert awake and oriented.  Pleasant to interaction. Cardiovascular: S1-S2 normal.  Bradycardic. Respiratory: Bilateral clear.  No added sounds. Gastrointestinal: Soft.  Nontender.  Bowel sound present. Ext: No swelling or edema. Neuro: No neurodeficits.    Data Reviewed: I have personally reviewed following labs and imaging studies  CBC: Recent Labs  Lab 05/23/22 0551 05/24/22 0647 05/28/22 1752 05/29/22 0318  WBC 8.8 7.6 9.0 9.1  NEUTROABS 5.9  --  6.3  --   HGB 11.2* 10.7* 10.1* 10.6*  HCT 34.5* 31.7* 30.0* 32.7*  MCV 100.9* 95.2 97.4 98.5  PLT 141* 213 378 [id number]*   Basic Metabolic Panel: Recent Labs  Lab 05/23/22 0551 05/24/22 0647 05/28/22 1752 05/29/22 0318  NA 131* 132* 135 135  K 3.3* 4.0 3.9 4.0  CL 93* 96* 104 99  CO2 27  $'28 22 23  'A$ GLUCOSE 92 94 104* 81  BUN '18 18 18 17  '$ CREATININE 1.13* 1.10* 1.14* 1.13*  1.21*  CALCIUM 8.2* 8.3* 8.2* 9.1  MG 1.7  --  1.5*  --   PHOS 3.5  --   --   --    GFR: Estimated Creatinine Clearance: 26.9 mL/min (A) (by C-G formula based on SCr of 1.21 mg/dL (H)). Liver Function Tests: Recent Labs  Lab 05/23/22 0551 05/28/22 1752 05/29/22 0318  AST 84* 35 36  ALT 53*  34 33  ALKPHOS 86 74 77  BILITOT 0.5 0.4 0.6  PROT 6.1* 6.1* 6.5  ALBUMIN 2.7* 2.5* 2.8*   No results for input(s): "LIPASE", "AMYLASE" in the last 168 hours. No results for input(s): "AMMONIA" in the last 168 hours. Coagulation Profile: No results for input(s): "INR", "PROTIME" in the last 168 hours. Cardiac Enzymes: No results for input(s): "CKTOTAL", "CKMB", "CKMBINDEX", "TROPONINI" in the last 168 hours. BNP (last 3 results) No results for input(s): "PROBNP" in the last 8760 hours. HbA1C: No results for input(s): "HGBA1C" in the last 72 hours. CBG: No results for input(s): "GLUCAP" in the last 168 hours. Lipid Profile: No results for input(s): "CHOL", "HDL", "LDLCALC", "TRIG", "CHOLHDL", "LDLDIRECT" in the last 72 hours. Thyroid Function Tests: Recent Labs    05/28/22 2222  TSH 5.107*  FREET4 1.20*   Anemia Panel: No results for input(s): "VITAMINB12", "FOLATE", "FERRITIN", "TIBC", "IRON", "RETICCTPCT" in the last 72 hours. Sepsis Labs: Recent Labs  Lab 05/29/22 0318  PROCALCITON 1.02    Recent Results (from the past 240 hour(s))  Resp panel by RT-PCR (RSV, Flu A&B, Covid) Anterior Nasal Swab     Status: None   Collection Time: 05/19/22 10:07 AM   Specimen: Anterior Nasal Swab  Result Value Ref Range Status   SARS Coronavirus 2 by RT PCR NEGATIVE NEGATIVE Final    Comment: (NOTE) SARS-CoV-2 target nucleic acids are NOT DETECTED.  The SARS-CoV-2 RNA is generally detectable in upper respiratory specimens during the acute phase of infection. The lowest concentration of SARS-CoV-2 viral copies this assay can detect is 138 copies/mL. A negative result does not preclude SARS-Cov-2 infection and should not be used as the sole basis for treatment or other patient management decisions. A negative result may occur with  improper specimen collection/handling, submission of specimen other than nasopharyngeal swab, presence of viral mutation(s) within the areas targeted by  this assay, and inadequate number of viral copies(<138 copies/mL). A negative result must be combined with clinical observations, patient history, and epidemiological information. The expected result is Negative.  Fact Sheet for Patients:  EntrepreneurPulse.com.au  Fact Sheet for Healthcare Providers:  IncredibleEmployment.be  This test is no t yet approved or cleared by the [place] FDA and  has been authorized for detection and/or diagnosis of SARS-CoV-2 by FDA under an Emergency Use Authorization (EUA). This EUA will remain  in effect (meaning this test can be used) for the duration of the COVID-19 declaration under Section 564(b)(1) of the Act, 21 U.S.C.section 360bbb-3(b)(1), unless the authorization is terminated  or revoked sooner.       Influenza A by PCR NEGATIVE NEGATIVE Final   Influenza B by PCR NEGATIVE NEGATIVE Final    Comment: (NOTE) The Xpert Xpress SARS-CoV-2/FLU/RSV plus assay is intended as an aid in the diagnosis of influenza from Nasopharyngeal swab specimens and should not be used as a sole basis for treatment. Nasal washings and aspirates are unacceptable for Xpert Xpress SARS-CoV-2/FLU/RSV testing.  Fact Sheet for Patients: EntrepreneurPulse.com.au  Fact Sheet for Healthcare Providers: IncredibleEmployment.be  This test is not yet approved or cleared by the [place] FDA and has been authorized for detection and/or diagnosis of SARS-CoV-2 by FDA under an Emergency Use Authorization (EUA). This EUA will remain in effect (meaning this test can be used) for the duration of the COVID-19 declaration under Section 564(b)(1) of the Act, 21 U.S.C. section 360bbb-3(b)(1), unless the authorization is terminated or revoked.     Resp Syncytial Virus by PCR NEGATIVE NEGATIVE Final    Comment: (NOTE) Fact Sheet for Patients: EntrepreneurPulse.com.au  Fact Sheet  for Healthcare Providers: IncredibleEmployment.be  This test is not yet approved or cleared by the [place] FDA and has been authorized for detection and/or diagnosis of SARS-CoV-2 by FDA under an Emergency Use Authorization (EUA). This EUA will remain in effect (meaning this test can be used) for the duration of the COVID-19 declaration under Section 564(b)(1) of the Act, 21 U.S.C. section 360bbb-3(b)(1), unless the authorization is terminated or revoked.  Performed at [facility], [place] [address] [place], [place] [address]   Blood culture (routine x 2)     Status: Abnormal   Collection Time: 05/19/22 11:17 AM   Specimen: Left Antecubital; Blood  Result Value Ref Range Status   Specimen Description   Final    LEFT ANTECUBITAL BLOOD Performed at [facility], [place] [address], [place], [place] [address]    Special Requests   Final    BOTTLES DRAWN AEROBIC AND ANAEROBIC Blood Culture results may not be optimal due to an excessive volume of blood received in culture bottles Performed at [place] [address], [place], [place] [address]    Culture  Setup Time   Final    GRAM NEGATIVE RODS IN BOTH AEROBIC AND ANAEROBIC BOTTLES CRITICAL VALUE NOTED.  VALUE IS CONSISTENT WITH PREVIOUSLY REPORTED AND CALLED VALUE.    Culture (A)  Final    ESCHERICHIA COLI SUSCEPTIBILITIES PERFORMED ON PREVIOUS CULTURE WITHIN THE LAST 5 DAYS. Performed at [facility], [place] [address] [place], [place] [address]    Report Status 05/22/2022 FINAL  Final  Blood culture (routine x 2)     Status: Abnormal   Collection Time: 05/19/22 11:25 AM   Specimen: Right Antecubital; Blood  Result Value Ref Range Status   Specimen Description   Final    RIGHT ANTECUBITAL BLOOD Performed at [facility], [place] [address], [place], [place] [address]    Special Requests   Final    BOTTLES DRAWN AEROBIC AND ANAEROBIC Blood Culture  results may not be optimal due to an excessive volume of blood received in culture bottles Performed at [place] [address], [place], [place] [address]    Culture  Setup Time   Final    GRAM NEGATIVE RODS IN BOTH AEROBIC AND ANAEROBIC BOTTLES CRITICAL RESULT CALLED TO, READ BACK BY AND VERIFIED WITH: E JACKSON,PHARMD'@0320'$  05/20/22 [place] Performed at [facility], [address], [place],  [address]    Culture ESCHERICHIA COLI (A)  Final   Report Status 05/22/2022 FINAL  Final   Organism ID, Bacteria ESCHERICHIA COLI  Final      Susceptibility   Escherichia coli - MIC*    AMPICILLIN 4 SENSITIVE Sensitive     CEFEPIME <=0.12 SENSITIVE Sensitive     CEFTAZIDIME <=1 SENSITIVE Sensitive     CEFTRIAXONE <=0.25 SENSITIVE Sensitive  CIPROFLOXACIN <=0.25 SENSITIVE Sensitive     GENTAMICIN <=1 SENSITIVE Sensitive     IMIPENEM <=0.25 SENSITIVE Sensitive     TRIMETH/SULFA <=20 SENSITIVE Sensitive     AMPICILLIN/SULBACTAM <=2 SENSITIVE Sensitive     PIP/TAZO <=4 SENSITIVE Sensitive     * ESCHERICHIA COLI  Blood Culture ID Panel (Reflexed)     Status: Abnormal   Collection Time: 05/19/22 11:25 AM  Result Value Ref Range Status   Enterococcus faecalis NOT DETECTED NOT DETECTED Final   Enterococcus Faecium NOT DETECTED NOT DETECTED Final   Listeria monocytogenes NOT DETECTED NOT DETECTED Final   Staphylococcus species NOT DETECTED NOT DETECTED Final   Staphylococcus aureus (BCID) NOT DETECTED NOT DETECTED Final   Staphylococcus epidermidis NOT DETECTED NOT DETECTED Final   Staphylococcus lugdunensis NOT DETECTED NOT DETECTED Final   Streptococcus species NOT DETECTED NOT DETECTED Final   Streptococcus agalactiae NOT DETECTED NOT DETECTED Final   Streptococcus pneumoniae NOT DETECTED NOT DETECTED Final   Streptococcus pyogenes NOT DETECTED NOT DETECTED Final   A.calcoaceticus-baumannii NOT DETECTED NOT DETECTED Final   Bacteroides fragilis NOT DETECTED  NOT DETECTED Final   Enterobacterales DETECTED (A) NOT DETECTED Final    Comment: Enterobacterales represent a large order of gram negative bacteria, not a single organism. CRITICAL RESULT CALLED TO, READ BACK BY AND VERIFIED WITH: E JACKSON,PHARMD'@0320'$  05/20/22 [place]    Enterobacter cloacae complex NOT DETECTED NOT DETECTED Final   Escherichia coli DETECTED (A) NOT DETECTED Final    Comment: CRITICAL RESULT CALLED TO, READ BACK BY AND VERIFIED WITH: E JACKSON,PHARMD'@0323'$  05/20/22 [place]    Klebsiella aerogenes NOT DETECTED NOT DETECTED Final   Klebsiella oxytoca NOT DETECTED NOT DETECTED Final   Klebsiella pneumoniae NOT DETECTED NOT DETECTED Final   Proteus species NOT DETECTED NOT DETECTED Final   Salmonella species NOT DETECTED NOT DETECTED Final   Serratia marcescens NOT DETECTED NOT DETECTED Final   Haemophilus influenzae NOT DETECTED NOT DETECTED Final   Neisseria meningitidis NOT DETECTED NOT DETECTED Final   Pseudomonas aeruginosa NOT DETECTED NOT DETECTED Final   Stenotrophomonas maltophilia NOT DETECTED NOT DETECTED Final   Candida albicans NOT DETECTED NOT DETECTED Final   Candida auris NOT DETECTED NOT DETECTED Final   Candida glabrata NOT DETECTED NOT DETECTED Final   Candida krusei NOT DETECTED NOT DETECTED Final   Candida parapsilosis NOT DETECTED NOT DETECTED Final   Candida tropicalis NOT DETECTED NOT DETECTED Final   Cryptococcus neoformans/gattii NOT DETECTED NOT DETECTED Final   CTX-M ESBL NOT DETECTED NOT DETECTED Final   Carbapenem resistance IMP NOT DETECTED NOT DETECTED Final   Carbapenem resistance KPC NOT DETECTED NOT DETECTED Final   Carbapenem resistance NDM NOT DETECTED NOT DETECTED Final   Carbapenem resist OXA 48 LIKE NOT DETECTED NOT DETECTED Final   Carbapenem resistance VIM NOT DETECTED NOT DETECTED Final    Comment: Performed at [facility], [address], [place], [place] [address]  Urine Culture     Status: Abnormal   Collection Time:  05/19/22  4:40 PM   Specimen: Urine, Random  Result Value Ref Range Status   Specimen Description   Final    URINE, RANDOM Performed at [place] [address] [place], [place] [address]    Special Requests   Final    NONE Reflexed from [id number] Performed at [place] [address], [place], [place] [address]    Culture >=100,000 COLONIES/mL ESCHERICHIA COLI (A)  Final   Report Status  05/21/2022 FINAL  Final   Organism ID, Bacteria ESCHERICHIA COLI (A)  Final      Susceptibility   Escherichia coli - MIC*    AMPICILLIN 4 SENSITIVE Sensitive     CEFAZOLIN <=4 SENSITIVE Sensitive     CEFEPIME <=0.12 SENSITIVE Sensitive     CEFTRIAXONE <=0.25 SENSITIVE Sensitive     CIPROFLOXACIN <=0.25 SENSITIVE Sensitive     GENTAMICIN <=1 SENSITIVE Sensitive     IMIPENEM <=0.25 SENSITIVE Sensitive     NITROFURANTOIN <=16 SENSITIVE Sensitive     TRIMETH/SULFA <=20 SENSITIVE Sensitive     AMPICILLIN/SULBACTAM <=2 SENSITIVE Sensitive     PIP/TAZO <=4 SENSITIVE Sensitive     * >=100,000 COLONIES/mL ESCHERICHIA COLI  MRSA Next Gen by PCR, Nasal     Status: Abnormal   Collection Time: 05/19/22  8:13 PM   Specimen: Nasal Mucosa; Nasal Swab  Result Value Ref Range Status   MRSA by PCR Next Gen DETECTED (A) NOT DETECTED Final    Comment: (NOTE) The GeneXpert MRSA Assay (FDA approved for NASAL specimens only), is one component of a comprehensive MRSA colonization surveillance program. It is not intended to diagnose MRSA infection nor to guide or monitor treatment for MRSA infections. Test performance is not FDA approved in patients less than 74 years old. Performed at [facility], [place] [address], [place], [place] [address]   Respiratory (~20 pathogens) panel by PCR     Status: Abnormal   Collection Time: 05/29/22  3:25 AM   Specimen: Urine, Clean Catch; Respiratory  Result Value Ref Range Status   Adenovirus NOT DETECTED NOT  DETECTED Final   Coronavirus 229E NOT DETECTED NOT DETECTED Final    Comment: (NOTE) The Coronavirus on the Respiratory Panel, DOES NOT test for the novel  Coronavirus (2019 nCoV)    Coronavirus HKU1 NOT DETECTED NOT DETECTED Final   Coronavirus NL63 NOT DETECTED NOT DETECTED Final   Coronavirus OC43 NOT DETECTED NOT DETECTED Final   Metapneumovirus NOT DETECTED NOT DETECTED Final   Rhinovirus / Enterovirus DETECTED (A) NOT DETECTED Final   Influenza A NOT DETECTED NOT DETECTED Final   Influenza B NOT DETECTED NOT DETECTED Final   Parainfluenza Virus 1 NOT DETECTED NOT DETECTED Final   Parainfluenza Virus 2 NOT DETECTED NOT DETECTED Final   Parainfluenza Virus 3 NOT DETECTED NOT DETECTED Final   Parainfluenza Virus 4 NOT DETECTED NOT DETECTED Final   Respiratory Syncytial Virus NOT DETECTED NOT DETECTED Final   Bordetella pertussis NOT DETECTED NOT DETECTED Final   Bordetella Parapertussis NOT DETECTED NOT DETECTED Final   Chlamydophila pneumoniae NOT DETECTED NOT DETECTED Final   Mycoplasma pneumoniae NOT DETECTED NOT DETECTED Final    Comment: Performed at [facility], [place]. [address], [place], [place] [address]         Radiology Studies: DG Chest Port 1 View  Result Date: 05/28/2022 CLINICAL DATA:  Bradycardia. EXAM: PORTABLE CHEST 1 VIEW COMPARISON:  05/24/2022 FINDINGS: Lungs are hyperexpanded. The lungs are clear without focal pneumonia, edema, pneumothorax or pleural effusion. Interstitial markings are diffusely coarsened with chronic features. Cardiopericardial silhouette is at upper limits of normal for size. The visualized bony structures of the thorax are unremarkable. Telemetry leads overlie the chest. IMPRESSION: Hyperexpansion with chronic interstitial coarsening. No acute cardiopulmonary findings. Electronically Signed   By: Misty Stanley M.D.   On: 05/28/2022 18:47        Scheduled Meds:  aspirin EC  81 mg Oral Daily   [START  ON 05/30/2022] atorvastatin   40 mg Oral Daily   fluticasone  2 spray Each Nare Daily   guaiFENesin  600 mg Oral BID   heparin  5,000 Units Subcutaneous Q8H   levothyroxine  125 mcg Oral Daily   losartan  100 mg Oral Daily   Continuous Infusions:   LOS: 1 day    Time spent: 35 minutes    Barb Merino, MD [facility] Pager [phone]

## 2022-05-30 ENCOUNTER — Inpatient Hospital Stay (HOSPITAL_BASED_OUTPATIENT_CLINIC_OR_DEPARTMENT_OTHER)
Admit: 2022-05-30 | Discharge: 2022-05-30 | Disposition: A | Discharge status: Home / Self Care | Payer: Medicare HMO | Attending: Cardiology | Admitting: Cardiology

## 2022-05-30 DIAGNOSIS — R001 Bradycardia, unspecified: Secondary | ICD-10-CM | POA: Diagnosis not present

## 2022-05-30 LAB — BASIC METABOLIC PANEL
Anion gap: 11 (ref 5–15)
BUN: 18 mg/dL (ref 8–23)
CO2: 19 mmol/L — ABNORMAL LOW (ref 22–32)
Calcium: 8.5 mg/dL — ABNORMAL LOW (ref 8.9–10.3)
Chloride: 105 mmol/L (ref 98–111)
Creatinine, Ser: 1.11 mg/dL — ABNORMAL HIGH (ref 0.44–1.00)
GFR, Estimated: 48 mL/min — ABNORMAL LOW (ref >60)
Glucose, Bld: 82 mg/dL (ref 70–99)
Potassium: 4.5 mmol/L (ref 3.5–5.1)
Sodium: 135 mmol/L (ref 135–145)

## 2022-05-30 LAB — MAGNESIUM: Magnesium: 2.1 mg/dL (ref 1.7–2.4)

## 2022-05-30 MED ORDER — GUAIFENESIN ER 600 MG PO TB12: 600.0000 mg | ORAL_TABLET | Freq: Two times a day (BID) | ORAL | 0 refills | Status: AC

## 2022-05-30 NOTE — Evaluation (Signed)
Occupational Therapy Evaluation Patient Details Name: Brandy Hicks MRN: [id number] DOB: 1933/01/16 Today's Date: 05/30/2022   History of Present Illness 89 yo female with pmh hypothyroidism, essential tremor, hyperlipidemia, osteoporosis, GBS ~5 years ago, CKD3b. Presented to ed 3/13 with hypotension.  Recent hospitalization 3/4 for N/V and fever.   Clinical Impression   Patient admitted for the diagnosis above.  PTA she lives at a local ILF, and needed PRN assist from her daughter.  Currently she is at or near her baseline for ADL completion and in room mobility, Mod I.  Discharge order has been placed, and patient has no acute or post acute OT needs.  Patient plans on contacting facility based PT to complete strengthening and balance activities post acute.  HR to 113 with mobility.        Recommendations for follow up therapy are one component of a multi-disciplinary discharge planning process, led by the attending physician.  Recommendations may be updated based on patient status, additional functional criteria and insurance authorization.   Follow Up Recommendations  No OT follow up     Assistance Recommended at Discharge PRN  Patient can return home with the following Assistance with cooking/housework    Functional Status Assessment  Patient has not had a recent decline in their functional status  Equipment Recommendations  None recommended by OT    Recommendations for Other Services       Precautions / Restrictions Precautions Precautions: Other (comment) Precaution Comments: monitor sats Restrictions Weight Bearing Restrictions: No      Mobility Bed Mobility Overal bed mobility: Independent                  Transfers Overall transfer level: Modified independent                 General transfer comment: up and walking in the room, reaching for objects at times for stability.      Balance Overall balance assessment: Mild deficits  observed, not formally tested                                         ADL either performed or assessed with clinical judgement   ADL Overall ADL's : At baseline                                             Vision Baseline Vision/History: 1 Wears glasses Patient Visual Report: No change from baseline       Perception     Praxis      Pertinent Vitals/Pain Pain Assessment Pain Assessment: No/denies pain     Hand Dominance Right   Extremity/Trunk Assessment Upper Extremity Assessment Upper Extremity Assessment: Overall WFL for tasks assessed   Lower Extremity Assessment Lower Extremity Assessment: Overall WFL for tasks assessed   Cervical / Trunk Assessment Cervical / Trunk Assessment: Normal   Communication Communication Communication: No difficulties   Cognition Arousal/Alertness: Awake/alert Behavior During Therapy: WFL for tasks assessed/performed Overall Cognitive Status: Within Functional Limits for tasks assessed                                       General Comments   HR  to 113 with mobility.    Exercises     Shoulder Instructions      Home Living Family/patient expects to be discharged to:: Assisted living Living Arrangements: Alone Available Help at Discharge: Family;Friend(s);Available PRN/intermittently Type of Home: Independent living facility Home Access: Level entry     Home Layout: One level     Bathroom Shower/Tub: Occupational psychologist: Handicapped height     Home Equipment: Grab bars - tub/shower;Grab bars - toilet;Rolling Walker (2 wheels)   Additional Comments: Independent living      Prior Functioning/Environment Prior Level of Function : Independent/Modified Independent;Driving             Mobility Comments: uses no device ADLs Comments: Ind        OT Problem List: Decreased activity tolerance      OT Treatment/Interventions:      OT  Goals(Current goals can be found in the care plan section) Acute Rehab OT Goals Patient Stated Goal: Return home OT Goal Formulation: All assessment and education complete, DC therapy Time For Goal Achievement: 06/02/22 Potential to Achieve Goals: Good  OT Frequency:      Co-evaluation              AM-PAC OT "6 Clicks" Daily Activity     Outcome Measure Help from another person eating meals?: None Help from another person taking care of personal grooming?: None Help from another person toileting, which includes using toliet, bedpan, or urinal?: None Help from another person bathing (including washing, rinsing, drying)?: None Help from another person to put on and taking off regular upper body clothing?: None Help from another person to put on and taking off regular lower body clothing?: None 6 Click Score: 24   End of Session Nurse Communication: Mobility status  Activity Tolerance: Patient tolerated treatment well Patient left: in bed;with call bell/phone within reach  OT Visit Diagnosis: Muscle weakness (generalized) (M62.81)                Time: [id number] OT Time Calculation (min): 20 min Charges:  OT General Charges $OT Visit: 1 Visit OT Evaluation $OT Eval Moderate Complexity: 1 Mod  05/30/2022  RP, OTR/L  Acute Rehabilitation Services  Office:  [phone]   Metta Clines 05/30/2022, 12:31 PM

## 2022-05-30 NOTE — Progress Notes (Signed)
PT Cancellation Note  Patient Details Name: Brandy Hicks [name] MRN: [id number] DOB: 09/14/1932   Cancelled Treatment:    Reason Eval/Treat Not Completed: PT screened, no needs identified, will sign off. Pt mobilizing well with OT and mobility.    Shary Decamp [facility] 05/30/2022, 12:27 PM [place] Office [phone]

## 2022-05-30 NOTE — Progress Notes (Signed)
EKG team has been notified for request of Zio, I have asked them to notify me when it has been placed and will update primary team. Patient follow up has been scheduled.

## 2022-05-30 NOTE — Progress Notes (Signed)
Rounding Note    Patient Name: Brandy Hicks Date of Encounter: 05/30/2022  [place] Cardiologist: Pixie Casino, MD   Subjective   Patient states she is feeling well overall.  She does admit some weakness and tiredness but largely attributes that to consistent hospital admissions and being bedridden for the past week.  She is generally very active and lives in dependent living at [facility] and does her normal ADLs unassisted.  Heart rate has ranged 60-70 and is no longer bradycardic.  She does say her tremor has worsened.  Inpatient Medications    Scheduled Meds:  aspirin EC  81 mg Oral Daily   atorvastatin  40 mg Oral Daily   fluticasone  2 spray Each Nare Daily   guaiFENesin  600 mg Oral BID   levothyroxine  125 mcg Oral Daily   losartan  100 mg Oral Daily   primidone  200 mg Oral BID AC   Continuous Infusions:  PRN Meds: albuterol, atropine   Vital Signs    Vitals:   05/29/22 1703 05/29/22 2006 05/30/22 0012 05/30/22 0518  BP: (!) 140/48 (!) 133/48 (!) 131/44 (!) 162/53  Pulse: 61 63 70 68  Resp: 18 16 18 18   Temp: 98.1 F (36.7 C) 99.3 F (37.4 C) 98.2 F (36.8 C) 98.3 F (36.8 C)  TempSrc: Oral Oral Oral Axillary  SpO2: 93% 91% 92% 98%  Weight:    53.3 kg  Height:        Intake/Output Summary (Last 24 hours) at 05/30/2022 0839 Last data filed at 05/29/2022 2143 Gross per 24 hour  Intake --  Output 600 ml  Net -600 ml      05/30/2022    5:18 AM 05/29/2022    3:19 PM 05/21/2022   11:44 AM  Last 3 Weights  Weight (lbs) 117 lb 8.1 oz 119 lb 128 lb 12 oz  Weight (kg) 53.3 kg 53.978 kg 58.4 kg      Telemetry    NSR with incomplete bundle, one brief pause 1.8sec with junctional beat at 20:19 on 05/29/2022 - Personally Reviewed  ECG    None new - Personally Reviewed  Physical Exam   GEN: No acute distress.   Neck: No JVD Cardiac: RRR, no murmurs, rubs, or gallops.  Respiratory: Clear to auscultation bilaterally. GI: Soft,  nontender, non-distended  MS: No edema; No deformity. Neuro:  Nonfocal  Psych: Normal affect   Labs    High Sensitivity Troponin:   Recent Labs  Lab 05/28/22 1752 05/28/22 2000  TROPONINIHS 17 14     Chemistry Recent Labs  Lab 05/28/22 1752 05/29/22 0318 05/30/22 0232  NA 135 135 135  K 3.9 4.0 4.5  CL 104 99 105  CO2 22 23 19*  GLUCOSE 104* 81 82  BUN 18 17 18   CREATININE 1.14* 1.13*  1.21* 1.11*  CALCIUM 8.2* 9.1 8.5*  MG 1.5*  --  2.1  PROT 6.1* 6.5  --   ALBUMIN 2.5* 2.8*  --   AST 35 36  --   ALT 34 33  --   ALKPHOS 74 77  --   BILITOT 0.4 0.6  --   GFRNONAA 47* 47*  43* 48*  ANIONGAP 9 13 11     Lipids No results for input(s): "CHOL", "TRIG", "HDL", "LABVLDL", "LDLCALC", "CHOLHDL" in the last 168 hours.  Hematology Recent Labs  Lab 05/24/22 0647 05/28/22 1752 05/29/22 0318  WBC 7.6 9.0 9.1  RBC 3.33* 3.08*  3.32*  HGB 10.7* 10.1* 10.6*  HCT 31.7* 30.0* 32.7*  MCV 95.2 97.4 98.5  MCH 32.1 32.8 31.9  MCHC 33.8 33.7 32.4  RDW 13.5 13.3 13.3  PLT 213 378 471*   Thyroid  Recent Labs  Lab 05/28/22 2222  TSH 5.107*  FREET4 1.20*    BNP Recent Labs  Lab 05/28/22 1752  BNP 228.7*    DDimer No results for input(s): "DDIMER" in the last 168 hours.   Radiology    ECHOCARDIOGRAM COMPLETE  Result Date: 05/29/2022    ECHOCARDIOGRAM REPORT   Patient Name:   Brandy Hicks Date of Exam: 05/29/2022 Medical Rec #:  [id number]               Height:       61.0 in Accession #:    [id number]              Weight:       128.7 lb Date of Birth:  05-Aug-1932                BSA:          1.566 m Patient Age:    89 years                BP:           149/44 mmHg Patient Gender: F                       HR:           55 bpm. Exam Location:  Inpatient Procedure: 2D Echo, Color Doppler and Cardiac Doppler Indications:    "Other abnormalities of the heart"  History:        Patient has prior history of Echocardiogram examinations, most                 recent 05/20/2022.  Risk Factors:Hypertension.  Sonographer:    Raquel Sarna Senior RDCS Referring Phys: [id number] [place]  1. Left ventricular ejection fraction, by estimation, is 60 to 65%. The left ventricle has normal function. The left ventricle has no regional wall motion abnormalities. Left ventricular diastolic parameters are consistent with Grade I diastolic dysfunction (impaired relaxation).  2. Right ventricular systolic function is normal. The right ventricular size is normal.  3. Left atrial size was mildly dilated.  4. The mitral valve is normal in structure. Trivial mitral valve regurgitation. No evidence of mitral stenosis. Moderate mitral annular calcification.  5. The aortic valve is tricuspid. There is moderate calcification of the aortic valve. Aortic valve regurgitation is not visualized. Aortic valve sclerosis/calcification is present, without any evidence of aortic stenosis.  6. The inferior vena cava is normal in size with greater than 50% respiratory variability, suggesting right atrial pressure of 3 mmHg. FINDINGS  Left Ventricle: Left ventricular ejection fraction, by estimation, is 60 to 65%. The left ventricle has normal function. The left ventricle has no regional wall motion abnormalities. The left ventricular internal cavity size was normal in size. There is  no left ventricular hypertrophy. Left ventricular diastolic parameters are consistent with Grade I diastolic dysfunction (impaired relaxation). Right Ventricle: The right ventricular size is normal. No increase in right ventricular wall thickness. Right ventricular systolic function is normal. Left Atrium: Left atrial size was mildly dilated. Right Atrium: Right atrial size was normal in size. Pericardium: There is no evidence of pericardial effusion. Mitral Valve: The mitral valve is normal in structure. Moderate  mitral annular calcification. Trivial mitral valve regurgitation. No evidence of mitral valve stenosis. Tricuspid Valve:  The tricuspid valve is normal in structure. Tricuspid valve regurgitation is not demonstrated. No evidence of tricuspid stenosis. Aortic Valve: The aortic valve is tricuspid. There is moderate calcification of the aortic valve. Aortic valve regurgitation is not visualized. Aortic valve sclerosis/calcification is present, without any evidence of aortic stenosis. Pulmonic Valve: The pulmonic valve was not well visualized. Pulmonic valve regurgitation is not visualized. No evidence of pulmonic stenosis. Aorta: The aortic root is normal in size and structure. Venous: The inferior vena cava is normal in size with greater than 50% respiratory variability, suggesting right atrial pressure of 3 mmHg. IAS/Shunts: No atrial level shunt detected by color flow Doppler.  LEFT VENTRICLE PLAX 2D LVIDd:         3.90 cm   Diastology LVIDs:         2.50 cm   LV e' medial:    7.51 cm/s LV PW:         1.00 cm   LV E/e' medial:  11.9 LV IVS:        0.80 cm   LV e' lateral:   7.83 cm/s LVOT diam:     1.80 cm   LV E/e' lateral: 11.4 LV SV:         64 LV SV Index:   41 LVOT Area:     2.54 cm  RIGHT VENTRICLE RV S prime:     13.30 cm/s TAPSE (M-mode): 2.0 cm LEFT ATRIUM             Index        RIGHT ATRIUM           Index LA diam:        3.50 cm 2.23 cm/m   RA Area:     10.60 cm LA Vol (A2C):   54.5 ml 34.80 ml/m  RA Volume:   22.20 ml  14.17 ml/m LA Vol (A4C):   41.2 ml 26.30 ml/m LA Biplane Vol: 50.1 ml 31.99 ml/m  AORTIC VALVE LVOT Vmax:   113.00 cm/s LVOT Vmean:  75.100 cm/s LVOT VTI:    0.252 m  AORTA Ao Root diam: 2.80 cm MITRAL VALVE MV Area (PHT): 2.02 cm     SHUNTS MV Decel Time: 376 msec     Systemic VTI:  0.25 m MV E velocity: 89.60 cm/s   Systemic Diam: 1.80 cm MV A velocity: 110.00 cm/s MV E/A ratio:  0.81 Glori Bickers MD Electronically signed by Glori Bickers MD Signature Date/Time: 05/29/2022/2:10:49 PM    Final    DG Chest Port 1 View  Result Date: 05/28/2022 CLINICAL DATA:  Bradycardia. EXAM: PORTABLE  CHEST 1 VIEW COMPARISON:  05/24/2022 FINDINGS: Lungs are hyperexpanded. The lungs are clear without focal pneumonia, edema, pneumothorax or pleural effusion. Interstitial markings are diffusely coarsened with chronic features. Cardiopericardial silhouette is at upper limits of normal for size. The visualized bony structures of the thorax are unremarkable. Telemetry leads overlie the chest. IMPRESSION: Hyperexpansion with chronic interstitial coarsening. No acute cardiopulmonary findings. Electronically Signed   By: Misty Stanley M.D.   On: 05/28/2022 18:47    Cardiac Studies   Echocardiogram 05/29/2022 1. Left ventricular ejection fraction, by estimation, is 60 to 65%. The  left ventricle has normal function. The left ventricle has no regional  wall motion abnormalities. Left ventricular diastolic parameters are  consistent with Grade I diastolic  dysfunction (impaired relaxation).   2. Right  ventricular systolic function is normal. The right ventricular  size is normal.   3. Left atrial size was mildly dilated.   4. The mitral valve is normal in structure. Trivial mitral valve  regurgitation. No evidence of mitral stenosis. Moderate mitral annular  calcification.   5. The aortic valve is tricuspid. There is moderate calcification of the  aortic valve. Aortic valve regurgitation is not visualized. Aortic valve  sclerosis/calcification is present, without any evidence of aortic  stenosis.   6. The inferior vena cava is normal in size with greater than 50%  respiratory variability, suggesting right atrial pressure of 3 mmHg.      Patient Profile     89 y.o. female with a hx of HTN, essential tremor on propranolol, HLD, osteoporosis, hypothyroidism, Guillan-Barre, CKD stage 3a, recent admission for E Coli sepsis on 03/04-03/09, former tobacco use/possible emphysema who is was admitted on 05/29/2022 for the evaluation of bradycardia.     Assessment & Plan    Symptomatic bradycardia in the  setting of hypomagnesia and propranolol use Patient with recent admission of sepsis due to E. coli UTI, CAP strep pneumo, with symptomatic bradycardia noted on 05/29/2022.  Of note she does have history of hypothyroidism with discordant labs and use of propanolol 60mg  for her tremors.  There has been concern for sinus node dysfunction perhaps exacerbated by propranolol and thyroid dysfunction in the setting of infection. BB has been withheld for washout. Review of telemetry from 05/29/2022 showed overall improvement with of HR and NSR with incomplete bundle, only one nonsustained 1.8 sec pause at 20:19 on 05/29/2022. HR has been ranging 60-80.  Today's goal is to get her ambulating and to see how she does symptomatically. - Propranolol 60 mg for tremors has been held - Allowing permissive hypertension.  Amlodipine chlorthalidone on hold pending final dispo - Given clinical improvement in conduction and no further pauses off BB, no indication for acute pacemaker but anticipate likely needs monitor at discharge - if she has recurrent bradycardia, would need to be considered for pacemaker - Magnesium now within normal range after repletion - AM EKG pending  Hypothyroidism - She has had discordant elevated levels of TSH and T4 in the setting of recent infection and hospitalization. Unclear contribution to possible sinus node dysfunction. Recommend close f/u with PCP to repeat in the upcoming weeks.  Essential HTN See statement above.  Continuing losartan. Amlodipine and chlorthalidone remain on hold, may be able to resume one today.  Rhinovirus/enterovirus +  Hypoxic respiratory failure due to community-acquired pneumonia CKD 3B (at baseline) E. coli UTI - defer to primary   For questions or updates, please contact [place] Please consult [email or web address] for contact info under        Signed, Bonnee Quin, PA-C  05/30/2022, 8:39 AM

## 2022-05-30 NOTE — Discharge Summary (Signed)
Physician Discharge Summary  NATILIE DIEHR [name] [id number] DOB: 10/17/1932 DOA: 05/28/2022  PCP: Davina Poke, FNP  Admit date: 05/28/2022 Discharge date: 05/30/2022  Admitted From: Home Disposition: Home  Recommendations for Outpatient Follow-up:  Follow up with PCP in 1-2 weeks Cardiology office will schedule follow-up Follow-up with neurology for your essential tremors.  Home Health: N/A Equipment/Devices: N/A, she is off oxygen now.  Discharge Condition: Stable CODE STATUS: DNR Diet recommendation: Regular diet, nutritional supplements  Discharge summary: 89 year old female with history of hypertension, hyper lipidemia, hypothyroidism, GB syndrome over 5 years ago, CKD stage IIIb and recent hospitalization 3/4-3/9 with diagnosis of sepsis due to UTI, strep pneumonia and discharged home on oral antibiotics and supplemental oxygen returns to the emergency room with feeling faint and having low blood pressure, heart rate in 30s with EMS. Necessitating 1 dose of atropine.  In the emergency room hemodynamically stable.  Magnesium 1.5.  Electrolytes are normal.  Renal functions at baseline.  Chest x-ray with hyperexpansion of chronic interstitial coarsening. Respiratory virus panel positive for rhinovirus.  COVID, influenza and RSV negative.  Admitted to the hospital with cardiology consultation for bradycardia.  Symptomatic bradycardia in the setting of hypomagnesemia and propranolol use and rhinovirus infection: Patient was kept in the hospital on telemetry monitor.  She was taken off propranolol.  Clinically improved.  Her heart rate is mostly 60-70 without further bradycardic episodes.  There was no evidence of heart block. Currently no indication for pacemaker.  Discontinuing propranolol. Patient was fitted with Zio patch for ambulatory heart rate monitoring and cardiology office will schedule follow-up. Electrolytes were aggressively replaced and adequate.   Essential  hypertension: Blood pressures are adequate.  Resume amlodipine, losartan and chlorthalidone.    History of E. coli UTI, strep pneumonia: Recent history.  Treated.  Improved.  Patient currently on room air.  She will no longer need oxygen.   Hypothyroidism: Recent TSH 0.18.  Synthroid dose was decreased to 125 mcg daily.  Continue.   Rhinovirus infection with underlying COPD: Symptomatic management.  Medically stable for discharge.   Discharge Diagnoses:  Principal Problem:   Symptomatic bradycardia    Discharge Instructions  Discharge Instructions     Diet - low sodium heart healthy   Complete by: As directed    Discharge instructions   Complete by: As directed    Please refrain from driving until your heart issues have been cleared   Increase activity slowly   Complete by: As directed       Allergies as of 05/30/2022       Reactions   Hydroquinone    Pinkness and edema of face and eyelids, severe        Medication List     STOP taking these medications    amoxicillin-clavulanate 500-125 MG tablet Commonly known as: AUGMENTIN   ondansetron 4 MG tablet Commonly known as: ZOFRAN   propranolol 60 MG tablet Commonly known as: INDERAL   senna-docusate 8.6-50 MG tablet Commonly known as: Senokot-S   witch hazel-glycerin pad Commonly known as: TUCKS       TAKE these medications    amLODipine 10 MG tablet Commonly known as: NORVASC Take 10 mg by mouth daily.   aspirin 81 MG tablet Take 81 mg by mouth daily.   atorvastatin 40 MG tablet Commonly known as: LIPITOR Take 1 tablet (40 mg total) by mouth daily.   chlorthalidone 15 MG tablet Commonly known as: HYGROTEN Take 1 tablet (15 mg total) by mouth  daily.   feeding supplement Liqd Take 237 mLs by mouth 3 (three) times daily between meals. What changed: when to take this   fluticasone 50 MCG/ACT nasal spray Commonly known as: FLONASE Place 2 sprays into both nostrils daily.   guaiFENesin  600 MG 12 hr tablet Commonly known as: MUCINEX Take 1 tablet (600 mg total) by mouth 2 (two) times daily for 5 days.   levothyroxine 150 MCG tablet Commonly known as: SYNTHROID Take 150 mcg by mouth daily.   losartan 100 MG tablet Commonly known as: COZAAR Take 100 mg by mouth daily.   Melatonin 10 MG Tabs Take 10 mg by mouth at bedtime.   MULTIVITAMIN ADULT PO Take 1 tablet by mouth daily.   primidone 50 MG tablet Commonly known as: MYSOLINE Take 200 mg by mouth 2 (two) times daily.   sertraline 50 MG tablet Commonly known as: ZOLOFT Take 50 mg by mouth daily.   Vitamin D 125 MCG (5000 UT) Caps Take 5,000 Units by mouth daily.        Follow-up Information     Marilynn Rail Jossie Ng, NP Follow up.   Specialty: Cardiology Why: [facility] - [facility] location - cardiology follow-up scheduled on Friday Jun 27, 2022 at 10:05 AM (Arrive by 9:50 AM). Denyse Amass is one of our nurse practitioners with our cardiology team. Contact information: [address] [place] [address] [phone]                Allergies  Allergen Reactions   Hydroquinone     Pinkness and edema of face and eyelids, severe    Consultations: Cardiology   Procedures/Studies: ECHOCARDIOGRAM COMPLETE  Result Date: 05/29/2022    ECHOCARDIOGRAM REPORT   Patient Name:   JALEE MCCLOY [name] Date of Exam: 05/29/2022 Medical Rec #:  [id number]               Height:       61.0 in Accession #:    [id number]              Weight:       128.7 lb Date of Birth:  [birth year]                BSA:          1.566 m Patient Age:    89 years                BP:           149/44 mmHg Patient Gender: F                       HR:           55 bpm. Exam Location:  Inpatient Procedure: 2D Echo, Color Doppler and Cardiac Doppler Indications:    "Other abnormalities of the heart"  History:        Patient has prior history of Echocardiogram examinations, most                 recent 05/20/2022. Risk  Factors:Hypertension.  Sonographer:    Raquel Sarna Senior RDCS Referring Phys: [id number] [place]  1. Left ventricular ejection fraction, by estimation, is 60 to 65%. The left ventricle has normal function. The left ventricle has no regional wall motion abnormalities. Left ventricular diastolic parameters are consistent with Grade I diastolic dysfunction (impaired relaxation).  2. Right ventricular systolic function is normal. The right ventricular size is normal.  3. Left atrial size was mildly dilated.  4. The mitral valve is normal in structure. Trivial mitral valve regurgitation. No evidence of mitral stenosis. Moderate mitral annular calcification.  5. The aortic valve is tricuspid. There is moderate calcification of the aortic valve. Aortic valve regurgitation is not visualized. Aortic valve sclerosis/calcification is present, without any evidence of aortic stenosis.  6. The inferior vena cava is normal in size with greater than 50% respiratory variability, suggesting right atrial pressure of 3 mmHg. FINDINGS  Left Ventricle: Left ventricular ejection fraction, by estimation, is 60 to 65%. The left ventricle has normal function. The left ventricle has no regional wall motion abnormalities. The left ventricular internal cavity size was normal in size. There is  no left ventricular hypertrophy. Left ventricular diastolic parameters are consistent with Grade I diastolic dysfunction (impaired relaxation). Right Ventricle: The right ventricular size is normal. No increase in right ventricular wall thickness. Right ventricular systolic function is normal. Left Atrium: Left atrial size was mildly dilated. Right Atrium: Right atrial size was normal in size. Pericardium: There is no evidence of pericardial effusion. Mitral Valve: The mitral valve is normal in structure. Moderate mitral annular calcification. Trivial mitral valve regurgitation. No evidence of mitral valve stenosis. Tricuspid Valve: The  tricuspid valve is normal in structure. Tricuspid valve regurgitation is not demonstrated. No evidence of tricuspid stenosis. Aortic Valve: The aortic valve is tricuspid. There is moderate calcification of the aortic valve. Aortic valve regurgitation is not visualized. Aortic valve sclerosis/calcification is present, without any evidence of aortic stenosis. Pulmonic Valve: The pulmonic valve was not well visualized. Pulmonic valve regurgitation is not visualized. No evidence of pulmonic stenosis. Aorta: The aortic root is normal in size and structure. Venous: The inferior vena cava is normal in size with greater than 50% respiratory variability, suggesting right atrial pressure of 3 mmHg. IAS/Shunts: No atrial level shunt detected by color flow Doppler.  LEFT VENTRICLE PLAX 2D LVIDd:         3.90 cm   Diastology LVIDs:         2.50 cm   LV e' medial:    7.51 cm/s LV PW:         1.00 cm   LV E/e' medial:  11.9 LV IVS:        0.80 cm   LV e' lateral:   7.83 cm/s LVOT diam:     1.80 cm   LV E/e' lateral: 11.4 LV SV:         64 LV SV Index:   41 LVOT Area:     2.54 cm  RIGHT VENTRICLE RV S prime:     13.30 cm/s TAPSE (M-mode): 2.0 cm LEFT ATRIUM             Index        RIGHT ATRIUM           Index LA diam:        3.50 cm 2.23 cm/m   RA Area:     10.60 cm LA Vol (A2C):   54.5 ml 34.80 ml/m  RA Volume:   22.20 ml  14.17 ml/m LA Vol (A4C):   41.2 ml 26.30 ml/m LA Biplane Vol: 50.1 ml 31.99 ml/m  AORTIC VALVE LVOT Vmax:   113.00 cm/s LVOT Vmean:  75.100 cm/s LVOT VTI:    0.252 m  AORTA Ao Root diam: 2.80 cm MITRAL VALVE MV Area (PHT): 2.02 cm     SHUNTS MV Decel Time: 376 msec  Systemic VTI:  0.25 m MV E velocity: 89.60 cm/s   Systemic Diam: 1.80 cm MV A velocity: 110.00 cm/s MV E/A ratio:  0.81 Glori Bickers MD Electronically signed by Glori Bickers MD Signature Date/Time: 05/29/2022/2:10:49 PM    Final    DG Chest Port 1 View  Result Date: 05/28/2022 CLINICAL DATA:  Bradycardia. EXAM: PORTABLE CHEST 1  VIEW COMPARISON:  05/24/2022 FINDINGS: Lungs are hyperexpanded. The lungs are clear without focal pneumonia, edema, pneumothorax or pleural effusion. Interstitial markings are diffusely coarsened with chronic features. Cardiopericardial silhouette is at upper limits of normal for size. The visualized bony structures of the thorax are unremarkable. Telemetry leads overlie the chest. IMPRESSION: Hyperexpansion with chronic interstitial coarsening. No acute cardiopulmonary findings. Electronically Signed   By: Misty Stanley M.D.   On: 05/28/2022 18:47   DG CHEST PORT 1 VIEW  Result Date: 05/24/2022 CLINICAL DATA:  Shortness of breath. EXAM: PORTABLE CHEST 1 VIEW COMPARISON:  05/23/2022. FINDINGS: Clear lungs. Stable cardiac and mediastinal contours. No pleural effusion or pneumothorax. Visualized portions of the bones and upper abdomen are unremarkable. IMPRESSION: No evidence of acute cardiopulmonary disease. Electronically Signed   By: Emmit Alexanders M.D.   On: 05/24/2022 09:18   US Abdomen Limited RUQ (LIVER/GB)  Result Date: 05/23/2022 CLINICAL DATA:  Elevated liver function tests EXAM: ULTRASOUND ABDOMEN LIMITED RIGHT UPPER QUADRANT COMPARISON:  Ultrasound 05/20/2022 kidneys.  Abdominal 01/02/2020 FINDINGS: Gallbladder: Previous cholecystectomy. Common bile duct: Diameter: 3 mm Liver: Slightly heterogeneous hepatic parenchyma. Portal vein is patent on color Doppler imaging with normal direction of blood flow towards the liver. Other: Small right pleural effusion. IMPRESSION: Previous cholecystectomy.  No ductal dilatation. Heterogeneous hepatic parenchyma. Electronically Signed   By: Jill Side M.D.   On: 05/23/2022 17:33   DG CHEST PORT 1 VIEW  Result Date: 05/23/2022 CLINICAL DATA:  Shortness of breath EXAM: PORTABLE CHEST 1 VIEW COMPARISON:  Chest radiograph dated 05/22/2022 FINDINGS: Normal lung volumes. Hazy bibasilar opacities. Slightly increased small left pleural effusion. No pneumothorax. The  heart size and mediastinal contours are within normal limits. The visualized skeletal structures are unremarkable. IMPRESSION: 1. Hazy bibasilar opacities, which may represent atelectasis, aspiration, or pneumonia. 2. Slightly increased small left pleural effusion. Electronically Signed   By: Darrin Nipper M.D.   On: 05/23/2022 08:15   DG CHEST PORT 1 VIEW  Result Date: 05/22/2022 CLINICAL DATA:  Shortness of breath EXAM: PORTABLE CHEST 1 VIEW COMPARISON:  Previous studies including the examination of 05/21/2022 FINDINGS: Cardiac size is within normal limits. There are no signs of pulmonary edema. There is faint haziness in the lower lung fields. Right lateral CP angle is indistinct. There is no pneumothorax. IMPRESSION: There are no signs of pulmonary edema. Faint haziness in the lower lung fields may be due to crowding of normal bronchovascular structures or minimal effusions or early infiltrates. Electronically Signed   By: Elmer Picker M.D.   On: 05/22/2022 08:13   DG CHEST PORT 1 VIEW  Result Date: 05/21/2022 CLINICAL DATA:  Shortness of breath EXAM: PORTABLE CHEST 1 VIEW COMPARISON:  Chest x-ray dated May 20, 2022 FINDINGS: Cardiac and mediastinal contours are unchanged. Trace bilateral pleural effusions and bibasilar atelectasis. No evidence of pneumothorax. IMPRESSION: Trace bilateral pleural effusions and bibasilar atelectasis. Electronically Signed   By: Yetta Glassman M.D.   On: 05/21/2022 13:21   ECHOCARDIOGRAM COMPLETE  Result Date: 05/20/2022    ECHOCARDIOGRAM REPORT   Patient Name:   Cylee L VAN [name] Date of  Exam: 05/20/2022 Medical Rec #:  [id number]               Height:       61.0 in Accession #:    [id number]              Weight:       132.9 lb Date of Birth:  Jan 24, 1933                BSA:          1.588 m Patient Age:    33 years                BP:           140/57 mmHg Patient Gender: F                       HR:           88 bpm. Exam Location:  Inpatient Procedure: 2D Echo  Indications:    bacteremia  History:        Patient has no prior history of Echocardiogram examinations.                 Risk Factors:Hypertension and Dyslipidemia.  Sonographer:    Harvie Junior Referring Phys: [id number] [place] MARSHALL  Sonographer Comments: Echo performed with patient supine and on artificial respirator and Technically difficult study due to poor echo windows. IMPRESSIONS  1. Left ventricular ejection fraction, by estimation, is 60 to 65%. The left ventricle has normal function. The left ventricle has no regional wall motion abnormalities. Left ventricular diastolic parameters were normal.  2. Right ventricular systolic function is normal. The right ventricular size is normal. There is normal pulmonary artery systolic pressure. The estimated right ventricular systolic pressure is Q000111Q mmHg.  3. The mitral valve is degenerative. Trivial mitral valve regurgitation. No evidence of mitral stenosis.  4. The aortic valve is calcified. There is moderate calcification of the aortic valve. There is mild thickening of the aortic valve. Aortic valve regurgitation is not visualized. Aortic valve sclerosis is present, with no evidence of aortic valve stenosis.  5. The inferior vena cava is normal in size with greater than 50% respiratory variability, suggesting right atrial pressure of 3 mmHg. Conclusion(s)/Recommendation(s): No evidence of valvular vegetations on this transthoracic echocardiogram. Consider a transesophageal echocardiogram to exclude infective endocarditis if clinically indicated. FINDINGS  Left Ventricle: Left ventricular ejection fraction, by estimation, is 60 to 65%. The left ventricle has normal function. The left ventricle has no regional wall motion abnormalities. The left ventricular internal cavity size was normal in size. There is  no left ventricular hypertrophy. Left ventricular diastolic parameters were normal. Right Ventricle: The right ventricular size is normal. No increase in  right ventricular wall thickness. Right ventricular systolic function is normal. There is normal pulmonary artery systolic pressure. The tricuspid regurgitant velocity is 2.50 m/s, and  with an assumed right atrial pressure of 3 mmHg, the estimated right ventricular systolic pressure is Q000111Q mmHg. Left Atrium: Left atrial size was normal in size. Right Atrium: Right atrial size was normal in size. Pericardium: There is no evidence of pericardial effusion. Mitral Valve: The mitral valve is degenerative in appearance. Mild to moderate mitral annular calcification. Trivial mitral valve regurgitation. No evidence of mitral valve stenosis. Tricuspid Valve: The tricuspid valve is grossly normal. Tricuspid valve regurgitation is trivial. No evidence of tricuspid stenosis. Aortic Valve: The aortic valve is calcified. There is moderate calcification of  the aortic valve. There is mild thickening of the aortic valve. Aortic valve regurgitation is not visualized. Aortic valve sclerosis is present, with no evidence of aortic valve stenosis. Aortic valve mean gradient measures 7.5 mmHg. Aortic valve peak gradient measures 13.2 mmHg. Aortic valve area, by VTI measures 1.72 cm. Pulmonic Valve: The pulmonic valve was grossly normal. Pulmonic valve regurgitation is not visualized. No evidence of pulmonic stenosis. Aorta: The aortic root is normal in size and structure. Venous: The inferior vena cava is normal in size with greater than 50% respiratory variability, suggesting right atrial pressure of 3 mmHg. IAS/Shunts: The atrial septum is grossly normal.  LEFT VENTRICLE PLAX 2D LVIDd:         3.30 cm     Diastology LVIDs:         1.90 cm     LV e' medial:    8.59 cm/s LV PW:         0.90 cm     LV E/e' medial:  15.4 LV IVS:        0.90 cm     LV e' lateral:   12.10 cm/s LVOT diam:     1.90 cm     LV E/e' lateral: 10.9 LV SV:         53 LV SV Index:   33 LVOT Area:     2.84 cm                             3D Volume EF: LV Volumes  (MOD)           3D EF:        69 % LV vol d, MOD A2C: 77.5 ml LV EDV:       101 ml LV vol d, MOD A4C: 60.6 ml LV ESV:       31 ml LV vol s, MOD A2C: 23.5 ml LV SV:        69 ml LV vol s, MOD A4C: 21.3 ml LV SV MOD A2C:     54.0 ml LV SV MOD A4C:     60.6 ml LV SV MOD BP:      49.1 ml RIGHT VENTRICLE RV Basal diam:  2.90 cm RV Mid diam:    2.20 cm RV S prime:     17.40 cm/s TAPSE (M-mode): 2.0 cm LEFT ATRIUM             Index        RIGHT ATRIUM           Index LA diam:        3.10 cm 1.95 cm/m   RA Area:     10.60 cm LA Vol (A2C):   31.1 ml 19.59 ml/m  RA Volume:   24.30 ml  15.30 ml/m LA Vol (A4C):   47.5 ml 29.92 ml/m LA Biplane Vol: 39.7 ml 25.00 ml/m  AORTIC VALVE                     PULMONIC VALVE AV Area (Vmax):    1.75 cm      PV Vmax:       1.27 m/s AV Area (Vmean):   1.64 cm      PV Peak grad:  6.5 mmHg AV Area (VTI):     1.72 cm AV Vmax:           182.00 cm/s AV Vmean:  126.000 cm/s AV VTI:            0.306 m AV Peak Grad:      13.2 mmHg AV Mean Grad:      7.5 mmHg LVOT Vmax:         112.50 cm/s LVOT Vmean:        72.850 cm/s LVOT VTI:          0.186 m LVOT/AV VTI ratio: 0.61  AORTA Ao Root diam: 3.10 cm MITRAL VALVE                TRICUSPID VALVE MV Area (PHT): 4.39 cm     TR Peak grad:   25.0 mmHg MV Decel Time: 173 msec     TR Vmax:        250.00 cm/s MR Peak grad: 70.4 mmHg MR Vmax:      419.50 cm/s   SHUNTS MV E velocity: 132.00 cm/s  Systemic VTI:  0.19 m MV A velocity: 130.00 cm/s  Systemic Diam: 1.90 cm MV E/A ratio:  1.02 Eleonore Chiquito MD Electronically signed by Eleonore Chiquito MD Signature Date/Time: 05/20/2022/6:59:43 PM    Final    CT CHEST WO CONTRAST  Result Date: 05/20/2022 CLINICAL DATA:  Hypoxia.  Abnormal chest x-ray EXAM: CT CHEST WITHOUT CONTRAST TECHNIQUE: Multidetector CT imaging of the chest was performed following the standard protocol without IV contrast. RADIATION DOSE REDUCTION: This exam was performed according to the departmental dose-optimization program  which includes automated exposure control, adjustment of the mA and/or kV according to patient size and/or use of iterative reconstruction technique. COMPARISON:  X-ray earlier 05/20/2022.  Prior CT scan 01/02/2020 FINDINGS: Cardiovascular: On this non IV contrast exam heart is nonenlarged. Significant mitral valve annular calcification. No pericardial effusion. Extensive calcifications along the thoracic aorta, great vessels and coronary arteries. Please correlate for any known significant stenosis of the great vessels. There is suggestion of such involving the right subclavian artery, the brachiocephalic artery and the left common carotid. Additional contrast study as clinically directed. Mediastinum/Nodes: No pneumomediastinum identified by CT scan. No specific abnormal lymph node enlargement identified on this noncontrast examination in the axillary region or hilum. There are some prominent nodes to the left of the aortic arch today. On image 47 of series 2 node measures 10 x 26 mm. Previously in October 2021 9 x 25 mm, not significantly changed. Other prominent mediastinal nodes also appear similar to that examination. Small hiatal hernia. Small thyroid gland. Lungs/Pleura: Small bilateral pleural effusions are identified, new from previous CT scan. Diffuse centrilobular emphysematous lung changes are identified as well. Both lower lobes show areas of interstitial thickening. There are bronchial wall thickening identified as well. Adjacent lung base opacities. Atelectasis favored over infiltrate. There is also reticulonodular changes in the middle lobe, lingula and inferior aspect of the upper lobes with tree-in-bud like areas these are increased from the study of 2021. No pneumothorax. Small right pleural calcification at the right lung base is stable. Upper Abdomen: Along the upper abdomen the adrenal glands are stable. Left adrenal gland is slightly thickened. Musculoskeletal: Diffuse anasarca some skin  thickening, nonspecific. Scattered degenerative changes along the spine. IMPRESSION: No pneumomediastinum identified by CT. Bilateral small pleural effusions identified with adjacent lung opacities. There is increasing tree-in-bud, reticulonodular changes in both lungs. In addition there is developing bronchial wall thickening along both lower lobes with some interstitial thickening. Please correlate for an acute process. Short-term follow up in 3 months may be of some benefit.  Underlying centrilobular emphysematous changes. Extensive vascular calcifications identified including along the great vessels with potential stenosis. If needed a follow up CT angiogram can be performed as clinically directed. Similar mildly enlarged and borderline multiple mediastinal lymph nodes going back to 2021. Diffuse anasarca and skin thickening. Aortic Atherosclerosis (ICD10-I70.0) and Emphysema (ICD10-J43.9). Electronically Signed   By: Jill Side M.D.   On: 05/20/2022 12:52   US RENAL  Result Date: 05/20/2022 CLINICAL DATA:  [id number] AKI (acute kidney injury) ([place]) [id number] EXAM: RENAL / URINARY TRACT ULTRASOUND COMPLETE COMPARISON:  None Available. FINDINGS: The right kidney measured 11.5 cm and the left kidney measured 10.0 cm. The kidneys demonstrate increased echogenicity consistent with chronic medical renal disease. Right kidney midpole cyst measures 1.9 cm. This does not need to be evaluated further. No shadowing stones are seen. No hydronephrosis identified. The urinary bladder appeared unremarkable. Bladder volumes were not measured. IMPRESSION: 1. Echogenic kidneys consistent with chronic medical renal disease. 2. Right kidney cyst. Electronically Signed   By: Sammie Bench M.D.   On: 05/20/2022 08:16   DG CHEST PORT 1 VIEW  Result Date: 05/20/2022 CLINICAL DATA:  Hypoxia EXAM: PORTABLE CHEST 1 VIEW COMPARISON:  Chest x-rays dated 05/19/2022 and 01/24/2020. FINDINGS: New subtle lucencies within the mediastinum,  suspicious for early pneumomediastinum. Increased interstitial markings bilaterally. No confluent opacity is seen to suggest a consolidating pneumonia. No pleural effusion or pneumothorax is seen. IMPRESSION: 1. New subtle lucencies within the mediastinum, suspicious for early pneumomediastinum. Recommend chest CT further characterization. 2. Increased interstitial markings bilaterally, suspicious for interstitial edema and/or atypical pneumonia superimposed on chronic interstitial lung disease. These results will be called to the ordering clinician or representative by the Radiologist Assistant, and communication documented in the PACS or [employer]. Electronically Signed   By: Franki Cabot M.D.   On: 05/20/2022 08:12   DG Chest 2 View  Result Date: 05/19/2022 CLINICAL DATA:  Fever EXAM: CHEST - 2 VIEW COMPARISON:  01/24/2020 FINDINGS: The heart size and mediastinal contours are within normal limits. Aortic atherosclerosis. Both lungs are clear. The visualized skeletal structures are unremarkable. IMPRESSION: No active cardiopulmonary disease. Electronically Signed   By: Davina Poke D.O.   On: 05/19/2022 10:19   (Echo, Carotid, EGD, Colonoscopy, ERCP)    Subjective: Patient seen in the morning rounds.  Her daughter was on the phone.  Patient herself denied any complaints.  She was able to get up and walk around without oxygen and was independent.  Heart rate remains mostly more than 60 and sinus rhythm for the last 24 hours.   Discharge Exam: Vitals:   05/30/22 0518 05/30/22 0840  BP: (!) 162/53 (!) 163/61  Pulse: 68 69  Resp: 18 17  Temp: 98.3 F (36.8 C) 98.1 F (36.7 C)  SpO2: 98% 95%   Vitals:   05/29/22 2006 05/30/22 0012 05/30/22 0518 05/30/22 0840  BP: (!) 133/48 (!) 131/44 (!) 162/53 (!) 163/61  Pulse: 63 70 68 69  Resp: 16 18 18 17   Temp: 99.3 F (37.4 C) 98.2 F (36.8 C) 98.3 F (36.8 C) 98.1 F (36.7 C)  TempSrc: Oral Oral Axillary Oral  SpO2: 91% 92% 98% 95%   Weight:   53.3 kg   Height:        General: Pt is alert, awake, not in acute distress Cardiovascular: RRR, S1/S2 +, no rubs, no gallops Respiratory: CTA bilaterally, no wheezing, no rhonchi Abdominal: Soft, NT, ND, bowel sounds + Extremities: no edema, no cyanosis  The results of significant diagnostics from this hospitalization (including imaging, microbiology, ancillary and laboratory) are listed below for reference.     Microbiology: Recent Results (from the past 240 hour(s))  Culture, blood (Routine X 2) w Reflex to ID Panel     Status: None (Preliminary result)   Collection Time: 05/29/22  2:10 AM   Specimen: BLOOD RIGHT FOREARM  Result Value Ref Range Status   Specimen Description BLOOD RIGHT FOREARM  Final   Special Requests   Final    BOTTLES DRAWN AEROBIC AND ANAEROBIC Blood Culture adequate volume   Culture   Final    NO GROWTH 1 DAY Performed at [facility], [place] [address] [place], [place] [address]    Report Status PENDING  Incomplete  Culture, blood (Routine X 2) w Reflex to ID Panel     Status: None (Preliminary result)   Collection Time: 05/29/22  3:18 AM   Specimen: BLOOD  Result Value Ref Range Status   Specimen Description BLOOD SITE NOT SPECIFIED  Final   Special Requests   Final    BOTTLES DRAWN AEROBIC AND ANAEROBIC Blood Culture adequate volume   Culture   Final    NO GROWTH 1 DAY Performed at [facility], [place] [address], [place], [place] [address]    Report Status PENDING  Incomplete  Respiratory (~20 pathogens) panel by PCR     Status: Abnormal   Collection Time: 05/29/22  3:25 AM   Specimen: Urine, Clean Catch; Respiratory  Result Value Ref Range Status   Adenovirus NOT DETECTED NOT DETECTED Final   Coronavirus 229E NOT DETECTED NOT DETECTED Final    Comment: (NOTE) The Coronavirus on the Respiratory Panel, DOES NOT test for the novel  Coronavirus (2019 nCoV)    Coronavirus HKU1 NOT DETECTED NOT DETECTED Final    Coronavirus NL63 NOT DETECTED NOT DETECTED Final   Coronavirus OC43 NOT DETECTED NOT DETECTED Final   Metapneumovirus NOT DETECTED NOT DETECTED Final   Rhinovirus / Enterovirus DETECTED (A) NOT DETECTED Final   Influenza A NOT DETECTED NOT DETECTED Final   Influenza B NOT DETECTED NOT DETECTED Final   Parainfluenza Virus 1 NOT DETECTED NOT DETECTED Final   Parainfluenza Virus 2 NOT DETECTED NOT DETECTED Final   Parainfluenza Virus 3 NOT DETECTED NOT DETECTED Final   Parainfluenza Virus 4 NOT DETECTED NOT DETECTED Final   Respiratory Syncytial Virus NOT DETECTED NOT DETECTED Final   Bordetella pertussis NOT DETECTED NOT DETECTED Final   Bordetella Parapertussis NOT DETECTED NOT DETECTED Final   Chlamydophila pneumoniae NOT DETECTED NOT DETECTED Final   Mycoplasma pneumoniae NOT DETECTED NOT DETECTED Final    Comment: Performed at [facility], [place]. [address] [place], [place] [address]     Labs: BNP (last 3 results) Recent Labs    05/28/22 1752  BNP 0000000*   Basic Metabolic Panel: Recent Labs  Lab 05/24/22 0647 05/28/22 1752 05/29/22 0318 05/30/22 0232  NA 132* 135 135 135  K 4.0 3.9 4.0 4.5  CL 96* 104 99 105  CO2 28 22 23  19*  GLUCOSE 94 104* 81 82  BUN 18 18 17 18   CREATININE 1.10* 1.14* 1.13*  1.21* 1.11*  CALCIUM 8.3* 8.2* 9.1 8.5*  MG  --  1.5*  --  2.1   Liver Function Tests: Recent Labs  Lab 05/28/22 1752 05/29/22 0318  AST 35 36  ALT 34 33  ALKPHOS 74 77  BILITOT 0.4 0.6  PROT 6.1*  6.5  ALBUMIN 2.5* 2.8*   No results for input(s): "LIPASE", "AMYLASE" in the last 168 hours. No results for input(s): "AMMONIA" in the last 168 hours. CBC: Recent Labs  Lab 05/24/22 0647 05/28/22 1752 05/29/22 0318  WBC 7.6 9.0 9.1  NEUTROABS  --  6.3  --   HGB 10.7* 10.1* 10.6*  HCT 31.7* 30.0* 32.7*  MCV 95.2 97.4 98.5  PLT 213 378 471*   Cardiac Enzymes: No results for input(s): "CKTOTAL", "CKMB", "CKMBINDEX", "TROPONINI" in the last 168  hours. BNP: Invalid input(s): "POCBNP" CBG: No results for input(s): "GLUCAP" in the last 168 hours. D-Dimer No results for input(s): "DDIMER" in the last 72 hours. Hgb A1c No results for input(s): "HGBA1C" in the last 72 hours. Lipid Profile No results for input(s): "CHOL", "HDL", "LDLCALC", "TRIG", "CHOLHDL", "LDLDIRECT" in the last 72 hours. Thyroid function studies Recent Labs    05/28/22 2222  TSH 5.107*   Anemia work up No results for input(s): "VITAMINB12", "FOLATE", "FERRITIN", "TIBC", "IRON", "RETICCTPCT" in the last 72 hours. Urinalysis    Component Value Date/Time   COLORURINE YELLOW 05/29/2022 0328   APPEARANCEUR CLEAR 05/29/2022 0328   LABSPEC 1.009 05/29/2022 0328   PHURINE 6.0 05/29/2022 0328   GLUCOSEU NEGATIVE 05/29/2022 0328   HGBUR NEGATIVE 05/29/2022 0328   BILIRUBINUR NEGATIVE 05/29/2022 0328   KETONESUR NEGATIVE 05/29/2022 0328   PROTEINUR NEGATIVE 05/29/2022 0328   NITRITE NEGATIVE 05/29/2022 0328   LEUKOCYTESUR NEGATIVE 05/29/2022 0328   Sepsis Labs Recent Labs  Lab 05/24/22 0647 05/28/22 1752 05/29/22 0318  WBC 7.6 9.0 9.1   Microbiology Recent Results (from the past 240 hour(s))  Culture, blood (Routine X 2) w Reflex to ID Panel     Status: None (Preliminary result)   Collection Time: 05/29/22  2:10 AM   Specimen: BLOOD RIGHT FOREARM  Result Value Ref Range Status   Specimen Description BLOOD RIGHT FOREARM  Final   Special Requests   Final    BOTTLES DRAWN AEROBIC AND ANAEROBIC Blood Culture adequate volume   Culture   Final    NO GROWTH 1 DAY Performed at [facility], [place] [address], [place], [place] [address]    Report Status PENDING  Incomplete  Culture, blood (Routine X 2) w Reflex to ID Panel     Status: None (Preliminary result)   Collection Time: 05/29/22  3:18 AM   Specimen: BLOOD  Result Value Ref Range Status   Specimen Description BLOOD SITE NOT SPECIFIED  Final   Special Requests   Final    BOTTLES DRAWN  AEROBIC AND ANAEROBIC Blood Culture adequate volume   Culture   Final    NO GROWTH 1 DAY Performed at [facility], [place] [address], [place], [place] [address]    Report Status PENDING  Incomplete  Respiratory (~20 pathogens) panel by PCR     Status: Abnormal   Collection Time: 05/29/22  3:25 AM   Specimen: Urine, Clean Catch; Respiratory  Result Value Ref Range Status   Adenovirus NOT DETECTED NOT DETECTED Final   Coronavirus 229E NOT DETECTED NOT DETECTED Final    Comment: (NOTE) The Coronavirus on the Respiratory Panel, DOES NOT test for the novel  Coronavirus (2019 nCoV)    Coronavirus HKU1 NOT DETECTED NOT DETECTED Final   Coronavirus NL63 NOT DETECTED NOT DETECTED Final   Coronavirus OC43 NOT DETECTED NOT DETECTED Final   Metapneumovirus NOT DETECTED NOT DETECTED Final   Rhinovirus / Enterovirus DETECTED (A) NOT DETECTED  Final   Influenza A NOT DETECTED NOT DETECTED Final   Influenza B NOT DETECTED NOT DETECTED Final   Parainfluenza Virus 1 NOT DETECTED NOT DETECTED Final   Parainfluenza Virus 2 NOT DETECTED NOT DETECTED Final   Parainfluenza Virus 3 NOT DETECTED NOT DETECTED Final   Parainfluenza Virus 4 NOT DETECTED NOT DETECTED Final   Respiratory Syncytial Virus NOT DETECTED NOT DETECTED Final   Bordetella pertussis NOT DETECTED NOT DETECTED Final   Bordetella Parapertussis NOT DETECTED NOT DETECTED Final   Chlamydophila pneumoniae NOT DETECTED NOT DETECTED Final   Mycoplasma pneumoniae NOT DETECTED NOT DETECTED Final    Comment: Performed at [facility], [place] [address], [place], [place] [address]     Time coordinating discharge: 35 minutes  SIGNED:   Barb Merino, MD  [facility] 05/30/2022, 11:43 AM

## 2022-05-30 NOTE — Progress Notes (Signed)
Mobility Specialist Progress Note:   05/30/22 1100  Mobility  Activity Ambulated with assistance in hallway  Level of Assistance Contact guard assist, steadying assist  Assistive Device None  Distance Ambulated (ft) 450 ft  Activity Response Tolerated well  Mobility Referral Yes  $Mobility charge 1 Mobility   Pt agreeable to mobility session. Required steadying assist at times during ambulation. Ambulated on RA, SpO2 WFL. Pt back in bed with all needs met.  Nelta Numbers Mobility Specialist Please contact via SecureChat or  Rehab office at [phone]

## 2022-05-31 LAB — T3, FREE: T3, Free: 1.7 pg/mL — ABNORMAL LOW (ref 2.0–4.4)

## 2022-06-03 LAB — CULTURE, BLOOD (ROUTINE X 2)
Culture: NO GROWTH
Culture: NO GROWTH
Special Requests: ADEQUATE
Special Requests: ADEQUATE

## 2022-06-04 ENCOUNTER — Telehealth: Payer: Self-pay | Admitting: General Practice

## 2022-06-04 NOTE — Telephone Encounter (Signed)
New Message:     Deidre from [facility] called. She needs a verbal order for a home health nurse evaluation.

## 2022-06-04 NOTE — Telephone Encounter (Signed)
I spoke with Deidra from [facility] and informed her that she will need to contact patient's PCP for a verbal order for home health services.

## 2022-06-19 NOTE — Addendum Note (Signed)
Encounter addended by: Markus Daft A on: 06/19/2022 4:23 PM  Actions taken: Imaging Exam ended

## 2022-06-25 NOTE — Progress Notes (Signed)
Cardiology Clinic Note   Patient Name: Brandy Hicks Date of Encounter: 06/27/2022  Primary Care Provider:  Myriam Jacobson, FNP Primary Cardiologist:  Chrystie Nose, MD  Patient Profile    Brandy Hicks 89 year old female presents to the clinic today for evaluation of her symptomatic bradycardia.  Past Medical History    Past Medical History:  Diagnosis Date   Chronic kidney disease, stage 3 unspecified    Constipation, unspecified    Depression    Essential hypertension    Essential tremor    Guillain Barr syndrome    Hormone replacement therapy    Hyperlipidemia    Hypertension    Hypothyroid    Hypothyroidism, unspecified    Impacted cerumen of both ears    Osteoporosis    Ovarian cyst    Radiculopathy, thoracic region    Retention of urine, unspecified    Rosacea    Seborrheic dermatitis, unspecified    Somnolence    Thoracic spine pain    TIA (transient ischemic attack)    Tremor    Unspecified mood (affective) disorder    Past Surgical History:  Procedure Laterality Date   CHOLECYSTECTOMY N/A 01/03/2020   Procedure: LAPAROSCOPIC CHOLECYSTECTOMY;  Surgeon: Harriette Bouillon, MD;  Location: [facility] OR;  Service: General;  Laterality: N/A;   FACIAL COSMETIC SURGERY     GALLBLADDER SURGERY     Post Gangerene Gallbladder Removal    HEEL SPUR SURGERY     HEEL SPUR SURGERY     TONSILLECTOMY      Allergies  Allergies  Allergen Reactions   Hydroquinone     Pinkness and edema of face and eyelids, severe    History of Present Illness    Brandy Hicks is a PMH of hypertension, essential tremor (previously on propranolol which was stopped due to symptomatic bradycardia) hyperlipidemia, osteoporosis, hypothyroidism, Guillain-Barr syndrome, CKD stage IIIa, former tobacco use, possible emphysema and history of E. coli sepsis infection (admission 05/19/2022 - 05/24/2022).  She was admitted to the hospital on 05/29/2022 for evaluation of  bradycardia.  During that time she reported increased fatigue.  She had been using 60 mg of propranolol for tremors.  Her propranolol was held for washout.  Her telemetry showed overall improvement.  She was noted to return to sinus rhythm with rate of 60-80 and incomplete bundle branch block.  She was noted to have one nonsustained pause lasting 1.8 seconds.  It was felt that there was no indication for pacemaker at that time.  Cardiac event monitor was ordered and showed sinus rhythm with a pauses up to 3.4 seconds and 6 episodes of SVT with the longest being 12 beats.  Her sinus pauses were noted to happen at night.  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  Dr. Rennis Golden is reviewing with EP.  She presents to the clinic today for follow-up evaluation and states she has occasional episodes of fatigue in the afternoon.  We reviewed her recent hospitalizations.  I also spoke with her daughter on the phone who is an Charity fundraiser.  We reviewed the importance of avoiding beta-blocker medications/AV nodal blocking agents.  We reviewed her cardiac event monitor she expressed understanding.  She presented with her partner [name].  At this time I will refer to EP for further evaluation and recommendations.  We reviewed recommendations for presenting to the emergency department.  Today she denies chest pain, shortness of breath, lower extremity edema,  palpitations, melena, hematuria, hemoptysis, diaphoresis, weakness, presyncope, syncope, orthopnea, and PND.   Home Medications    Prior to Admission medications   Medication Sig Start Date End Date Taking? Authorizing Provider  amLODipine (NORVASC) 10 MG tablet Take 10 mg by mouth daily.    [provider]  aspirin 81 MG tablet Take 81 mg by mouth daily.    [provider]  atorvastatin (LIPITOR) 40 MG tablet Take 1 tablet (40 mg total) by mouth daily. 05/30/22   Marguerita MerlesSheikh, Omair Latif, DO  chlorthalidone (HYGROTEN) 15 MG tablet Take 1  tablet (15 mg total) by mouth daily. 05/24/22   Marguerita MerlesSheikh, Omair Latif, DO  Cholecalciferol (VITAMIN D) 125 MCG (5000 UT) CAPS Take 5,000 Units by mouth daily.    [provider]  feeding supplement (ENSURE ENLIVE / ENSURE PLUS) LIQD Take 237 mLs by mouth 3 (three) times daily between meals. Patient taking differently: Take 237 mLs by mouth 2 (two) times daily between meals. 05/24/22   Sheikh, Omair Latif, DO  fluticasone (FLONASE) 50 MCG/ACT nasal spray Place 2 sprays into both nostrils daily.    [provider]  levothyroxine (SYNTHROID) 150 MCG tablet Take 150 mcg by mouth daily. 02/04/19   [provider]  losartan (COZAAR) 100 MG tablet Take 100 mg by mouth daily.    [provider]  Melatonin 10 MG TABS Take 10 mg by mouth at bedtime.     [provider]  Multiple Vitamins-Minerals (MULTIVITAMIN ADULT PO) Take 1 tablet by mouth daily.    [provider]  primidone (MYSOLINE) 50 MG tablet Take 200 mg by mouth 2 (two) times daily.    [provider]  sertraline (ZOLOFT) 50 MG tablet Take 50 mg by mouth daily.  05/21/15   [provider]    Family History    Family History  Problem Relation Age of Onset   Stroke Mother        CABG age 87   CAD Mother    Hypertension Father    CAD Father    Prostate cancer Father    Liver disease Sister    Hypertension Daughter    Thyroid disease Daughter    Breast cancer Daughter    Heart murmur Daughter    Hypertension Daughter    Multiple sclerosis Daughter    Kidney disease Daughter    She indicated that her mother is deceased. She indicated that her father is deceased. She indicated that both of her sisters are alive. She indicated that her maternal grandmother is deceased. She indicated that her maternal grandfather is deceased. She indicated that her paternal grandmother is deceased. She indicated that her paternal grandfather is deceased. She indicated that both of her daughters  are alive.  Social History    Social History   Socioeconomic History   Marital status: Widowed    Spouse name: Not on file   Number of children: Not on file   Years of education: Not on file   Highest education level: Not on file  Occupational History   Not on file  Tobacco Use   Smoking status: Former   Smokeless tobacco: Never  Vaping Use   Vaping Use: Never used  Substance and Sexual Activity   Alcohol use: No   Drug use: No   Sexual activity: Not Currently  Other Topics Concern   Not on file  Social History Narrative   Lives at alone in an apartment  Diet: Eat when hungry no special diet       Do you drink/ eat things with caffeine? Yes      Marital status:   Widowed                             What year were you married ? 1955      Do you live in a house, apartment,assistred living, condo, trailer, etc.)? Independent Living       Is it one or more stories? Elevator      How many persons live in your home ? Self      Do you have any pets in your home ?(please list) 0      Highest Level of education completed: HS      Current or past profession: [employer]      Do you exercise?  No                            Type & how often Blank      ADVANCED DIRECTIVES (Please bring copies)      Do you have a living will? Yes      Do you have a DNR form?  Yes                     If not, do you want to discuss one?       Do you have signed POA?HPOA forms?  Yes               If so, please bring to your appointment      FUNCTIONAL STATUS- To be completed by Spouse / child / Staff       Do you have difficulty bathing or dressing yourself ? No      Do you have difficulty preparing food or eating ?  No      Do you have difficulty managing your mediation ?  No      Do you have difficulty managing your finances ?  No (But granddaughter handles)       Do you have difficulty affording your medication ?  No         Social Determinants of Health   Financial  Resource Strain: Not on file  Food Insecurity: No Food Insecurity (05/29/2022)   Hunger Vital Sign    Worried About Running Out of Food in the Last Year: Never true    Ran Out of Food in the Last Year: Never true  Transportation Needs: No Transportation Needs (05/29/2022)   PRAPARE - Administrator, Civil Service (Medical): No    Lack of Transportation (Non-Medical): No  Physical Activity: Not on file  Stress: Not on file  Social Connections: Not on file  Intimate Partner Violence: Not At Risk (05/29/2022)   Humiliation, Afraid, Rape, and Kick questionnaire    Fear of Current or Ex-Partner: No    Emotionally Abused: No    Physically Abused: No    Sexually Abused: No     Review of Systems    General:  No chills, fever, night sweats or weight changes.  Cardiovascular:  No chest pain, dyspnea on exertion, edema, orthopnea, palpitations, paroxysmal nocturnal dyspnea. Dermatological: No rash, lesions/masses Respiratory: No cough, dyspnea Urologic: No hematuria, dysuria Abdominal:   No nausea, vomiting, diarrhea, bright red blood per rectum, melena, or hematemesis Neurologic:  No visual  changes, wkns, changes in mental status. All other systems reviewed and are otherwise negative except as noted above.  Physical Exam    VS:  BP 134/62   Pulse 69   Ht 5\' 1"  (1.549 m)   Wt 115 lb 12.8 oz (52.5 kg)   SpO2 96%   BMI 21.88 kg/m  , BMI Body mass index is 21.88 kg/m. GEN: Well nourished, well developed, in no acute distress. HEENT: normal. Neck: Supple, no JVD, carotid bruits, or masses. Cardiac: RRR, no murmurs, rubs, or gallops. No clubbing, cyanosis, edema.  Radials/DP/PT 2+ and equal bilaterally.  Respiratory:  Respirations regular and unlabored, clear to auscultation bilaterally. GI: Soft, nontender, nondistended, BS + x 4. MS: no deformity or atrophy. Skin: warm and dry, no rash. Neuro:  Strength and sensation are intact.  Essential tremor Psych: Normal  affect.  Accessory Clinical Findings    Recent Labs: 05/28/2022: B Natriuretic Peptide 228.7; TSH 5.107 05/29/2022: ALT 33; Hemoglobin 10.6; Platelets 471 05/30/2022: BUN 18; Creatinine, Ser 1.11; Magnesium 2.1; Potassium 4.5; Sodium 135   Recent Lipid Panel No results found for: "CHOL", "TRIG", "HDL", "CHOLHDL", "VLDL", "LDLCALC", "LDLDIRECT"       ECG personally reviewed by me today-none today.  Echocardiogram 05/29/2022 1. Left ventricular ejection fraction, by estimation, is 60 to 65%. The  left ventricle has normal function. The left ventricle has no regional  wall motion abnormalities. Left ventricular diastolic parameters are  consistent with Grade I diastolic  dysfunction (impaired relaxation).   2. Right ventricular systolic function is normal. The right ventricular  size is normal.   3. Left atrial size was mildly dilated.   4. The mitral valve is normal in structure. Trivial mitral valve  regurgitation. No evidence of mitral stenosis. Moderate mitral annular  calcification.   5. The aortic valve is tricuspid. There is moderate calcification of the  aortic valve. Aortic valve regurgitation is not visualized. Aortic valve  sclerosis/calcification is present, without any evidence of aortic  stenosis.   6. The inferior vena cava is normal in size with greater than 50%  respiratory variability, suggesting right atrial pressure of 3 mmHg.     Cardiac event monitor 05/30/2022   Patch Wear Time:  13 days and 22 hours (2024-03-15T11:21:05-398 to 2024-03-29T09:45:26-0400)   Patient had a min HR of 29 bpm, max HR of 179 bpm, and avg HR of 63 bpm. Predominant underlying rhythm was Sinus Rhythm. 6 Supraventricular Tachycardia runs occurred, the run with the fastest interval lasting 12 beats with a max rate of 179 bpm, the  longest lasting 10 beats with an avg rate of 112 bpm. 8 Pauses occurred, the longest lasting 3.4 secs (18 bpm). Idioventricular Rhythm was present. Isolated  SVEs were rare (<1.0%), SVE Couplets were rare (<1.0%), and SVE Triplets were rare (<1.0%).  Isolated VEs were rare (<1.0%), VE Couplets were rare (<1.0%), and no VE Triplets were present.    Monitor showed sinus rhythm with a 8 sinus pauses up to 3.4 seconds. 6 episodes of SVT were noted up to 12 beats duration with rates up to 179 bpm. The sinus pauses were mostly at night and not patient triggered (asymptomatic).  Findings suggest tachy-brady syndrome. Will d/w EP as to whether PPM is indicated since she is asymptomatic and has washed out of beta blocker.   Chrystie Nose, MD, [address] [facility], FACP  [place]  [facility]  Medical Director of the Advanced Lipid Disorders &  Cardiovascular Risk Reduction Clinic Diplomate of the American  Board of Clinical Lipidology Attending Cardiologist   Assessment & Plan   1.  Symptomatic bradycardia-heart rate today 69.  Denies further episodes of lightheadedness, presyncope or syncope.  Energy recovering.  Occasional episodes of fatigue in the afternoon.  Cardiac event monitor showed 8 sinus pauses lasting up to 3.4 seconds.  6 episodes of SVT were also noted.  Continue to avoid AV nodal blocking agents beta-blocker therapy.   Avoid AV nodal blocking agents  EP referral  Essential hypertension-BP today 134/62.  Amlodipine and chlorthalidone placed on hold during recent admission due to bradycardia. Continue losartan Heart healthy low-sodium diet  Hypothyroidism-recently elevated in the setting of acute E. coli infection. Follows with PCP  CKD stage IIIb-creatinine 1.1 on 05/30/2022. Follows with PCP  Disposition: Follow-up with Dr. Rennis Golden in 3-4 months.   Thomasene Ripple. Shaniqwa Horsman NP-C     06/27/2022, 10:19 AM [facility] [address] Office [phone] Fax [phone]    I spent 14 minutes examining this patient, reviewing medications, and using patient centered shared decision making involving her cardiac  care.  Prior to her visit I spent greater than 20 minutes reviewing her past medical history,  medications, and prior cardiac tests.

## 2022-06-27 ENCOUNTER — Ambulatory Visit: Payer: Medicare HMO | Attending: General Practice | Admitting: General Practice

## 2022-06-27 ENCOUNTER — Encounter: Payer: Self-pay | Admitting: General Practice

## 2022-06-27 VITALS — BP 134/62 | HR 69 | Ht 61.0 in | Wt 115.8 lb

## 2022-06-27 DIAGNOSIS — R001 Bradycardia, unspecified: Secondary | ICD-10-CM

## 2022-06-27 DIAGNOSIS — E038 Other specified hypothyroidism: Secondary | ICD-10-CM | POA: Diagnosis not present

## 2022-06-27 DIAGNOSIS — I10 Essential (primary) hypertension: Secondary | ICD-10-CM | POA: Diagnosis not present

## 2022-06-27 DIAGNOSIS — N1832 Chronic kidney disease, stage 3b: Secondary | ICD-10-CM | POA: Diagnosis not present

## 2022-06-27 NOTE — Patient Instructions (Signed)
Medication Instructions:  The current medical regimen is effective;  continue present plan and medications as directed. Please refer to the Current Medication list given to you today.  *If you need a refill on your cardiac medications before your next appointment, please call your pharmacy*  Lab Work: NONE If you have labs (blood work) drawn today and your tests are completely normal, you will receive your results only by:  MyChart Message (if you have MyChart) OR A paper copy in the mail If you have any lab test that is abnormal or we need to change your treatment, we will call you to review the results.   Testing/Procedures: REFERRAL TO ELECTROPHYSIOLOGY   Other Instructions AVOID AV NODAL BLOCK AGENTS- RX'S ENDING IN -LOL   Follow-Up: At [facility], you and your health needs are our priority.  As part of our continuing mission to provide you with exceptional heart care, we have created designated Provider Care Teams.  These Care Teams include your primary Cardiologist (physician) and Advanced Practice Providers (APPs -  Physician Assistants and Nurse Practitioners) who all work together to provide you with the care you need, when you need it.  We recommend signing up for the patient portal called "MyChart".  Sign up information is provided on this After Visit Summary.  MyChart is used to connect with patients for Virtual Visits (Telemedicine).  Patients are able to view lab/test results, encounter notes, upcoming appointments, etc.  Non-urgent messages can be sent to your provider as well.   To learn more about what you can do with MyChart, go to [email or web address].    Your next appointment:   4 month(s)  Provider:   Chrystie Nose, MD

## 2022-07-22 ENCOUNTER — Ambulatory Visit: Payer: Medicare HMO | Admitting: Neurology

## 2022-07-22 ENCOUNTER — Encounter: Payer: Self-pay | Admitting: Neurology

## 2022-07-22 VITALS — BP 163/77 | HR 71 | Ht 61.0 in | Wt 109.4 lb

## 2022-07-22 DIAGNOSIS — G250 Essential tremor: Secondary | ICD-10-CM

## 2022-07-22 NOTE — Progress Notes (Signed)
[facility]    Provider:  Dr Lucia Gaskins Requesting Provider: Myriam Jacobson, FNP Primary Care Provider:  Theodis Shove, DO  CC:  debilitating essential tremor  HPI:  Brandy Hicks is a 89 y.o. female here as requested by Myriam Jacobson, FNP for essential tremor. PMHx has Essential hypertension, benign; UTI (urinary tract infection); Acute kidney injury (HCC); Altered mental status; History of Guillain-Barre syndrome; Anxiety state; Panic attack; Essential tremor; GBS (Guillain-Barre syndrome) (HCC); Depression; Insomnia; Voice tremor; Fecal impaction (HCC); Acute urinary retention; Leukocytosis; Urinary retention; Hyponatremia; Hypokalemia; Acute cholecystitis; Hypomagnesemia; Hyperkalemia; Mixed hyperlipidemia; Hypothyroidism; Chest pain; Lung infiltrate on CT; Cholecystitis, acute; SIRS (systemic inflammatory response syndrome) (HCC); Septic shock (HCC); Sepsis due to Escherichia coli (E. coli) (HCC); Malnutrition of moderate degree; and Symptomatic bradycardia on their problem list.   I reviewed ED notes from Dr. Jerral Ralph.  Patient was referred from a [facility] for essential tremors, she was admitted March 13 and discharged March 15, she was diagnosed with sepsis due to UTI, strep pneumo and discharged on oral antibiotics and supplemental oxygen and she return to the emergency room with feeling faint and having low blood pressure, heart rate in the 30s necessitating atropine.  Her magnesium was 1.5.  She was positive for rhinovirus but COVID influenza and RSV were negative and she was admitted to the hospital with a cardiology consultation for bradycardia.  Her bradycardia was symptomatic in the setting of hypomagnesemia and propranolol use and rhinovirus infection.  She was taken off the propranolol.  Her heart rate improved mostly 60-70 without further bradycardic episodes.  No evidence of heart block.  Patient was fitted with a Zio patch for ambulatory heart  rate monitoring and cardiology office was also referral for her.  My assumption is that she was on the propranolol for essential tremor.  Looking at her med she is currently on Topamax, that is a medication that can be used for essential tremor but also for other conditions she was placed on it June 19, 2022.  She is also on primidone 200 mg by mouth.  I have seen her in the past but have not seen her since 2018.   I reviewed my notes back from 2017 and 2018, we have not seen her since then, we saw her for essential tremor, at that time she was on primidone, she was on propranolol which we did not want to increase due to bradycardia and she was also on gabapentin.  At that time we also thought that she needed to be evaluated for deep brain stimulation for her severe tremor.  She denies drinking caffeine, her thyroid had been checked, she had had tremors for years 10 to 15 years slowly worsening.  We referred her to [facility] where she saw Dr. Westley Hummer who stated she would be a reasonable candidate for bilateral thalamic deep brain stimulation, left brain for right sided symptoms would be recommended first followed 3 to 6 months later after recovery for right brain for left-sided symptoms.  He also noted tremor both hands, disabling tremors, head chin tremor, voice tremor.  It does not appear as though she went through with the procedure despite even goin through psychological testing with results: "There are no cognitive or emotional/behavioral contraindications pertaining to the patient's participation in DBS surgery. "  Patient here with friend. She still have the temor in the hands, the chin, the voice, not drinking caffeine, thyroid has been evaluated. She has had the  tremors for a very long time since a teenager. Now feeling the shaking occ in the legs only twice. She has not fallen because of the tremor. She was taken off propranolol bc of bradycardia. Still on 400mg  of primidone, tried gabapentin in the  past, she went off of the topiramate due to weight loss and side effects, tried primidone, topiramate, gabapentin, propranolol. Right is worst than the left. Worse with being upset. Not at rest, usually with activity esp eating. Writing and eating are the worst. Can still eat, can't write. We discussed essential.    Reviewed notes, labs and imaging from outside physicians, which showed: see above  Review of Systems: Patient complains of symptoms per HPI as well as the following symptoms tremor. Pertinent negatives and positives per HPI. All others negative.   Social History   Socioeconomic History   Marital status: Widowed    Spouse name: Not on file   Number of children: Not on file   Years of education: Not on file   Highest education level: Not on file  Occupational History   Not on file  Tobacco Use   Smoking status: Former   Smokeless tobacco: Never  Vaping Use   Vaping Use: Never used  Substance and Sexual Activity   Alcohol use: No   Drug use: No   Sexual activity: Not Currently  Other Topics Concern   Not on file  Social History Narrative   Lives at alone in an apartment         Diet: Eat when hungry no special diet       Do you drink/ eat things with caffeine? Yes      Marital status:   Widowed                             What year were you married ? 1955      Do you live in a house, apartment,assistred living, condo, trailer, etc.)? Independent Living       Is it one or more stories? Elevator      How many persons live in your home ? Self      Do you have any pets in your home ?(please list) 0      Highest Level of education completed: HS      Current or past profession: [employer]      Do you exercise?  No                            Type & how often Blank      ADVANCED DIRECTIVES (Please bring copies)      Do you have a living will? Yes      Do you have a DNR form?  Yes                     If not, do you want to discuss one?       Do you have signed  POA?HPOA forms?  Yes               If so, please bring to your appointment      FUNCTIONAL STATUS- To be completed by Spouse / child / Staff       Do you have difficulty bathing or dressing yourself ? No      Do you have difficulty preparing food or eating ?  No  Do you have difficulty managing your mediation ?  No      Do you have difficulty managing your finances ?  No (But granddaughter handles)       Do you have difficulty affording your medication ?  No         Social Determinants of Health   Financial Resource Strain: Not on file  Food Insecurity: No Food Insecurity (05/29/2022)   Hunger Vital Sign    Worried About Running Out of Food in the Last Year: Never true    Ran Out of Food in the Last Year: Never true  Transportation Needs: No Transportation Needs (05/29/2022)   PRAPARE - Administrator, Civil Service (Medical): No    Lack of Transportation (Non-Medical): No  Physical Activity: Not on file  Stress: Not on file  Social Connections: Not on file  Intimate Partner Violence: Not At Risk (05/29/2022)   Humiliation, Afraid, Rape, and Kick questionnaire    Fear of Current or Ex-Partner: No    Emotionally Abused: No    Physically Abused: No    Sexually Abused: No    Family History  Problem Relation Age of Onset   Stroke Mother        CABG age 76   CAD Mother    Hypertension Father    CAD Father    Prostate cancer Father    Liver disease Sister    Hypertension Daughter    Thyroid disease Daughter    Breast cancer Daughter    Heart murmur Daughter    Hypertension Daughter    Multiple sclerosis Daughter    Kidney disease Daughter     Past Medical History:  Diagnosis Date   Chronic kidney disease, stage 3 unspecified (HCC)    Constipation, unspecified    Depression    Essential hypertension    Essential tremor    Guillain Barr syndrome (HCC)    Hormone replacement therapy    Hyperlipidemia    Hypertension    Hypothyroid     Hypothyroidism, unspecified    Impacted cerumen of both ears    Osteoporosis    Ovarian cyst    Pneumonia 05/2022   Radiculopathy, thoracic region    Retention of urine, unspecified    Rosacea    Seborrheic dermatitis, unspecified    Somnolence    Thoracic spine pain    TIA (transient ischemic attack)    Tremor    Unspecified mood (affective) disorder (HCC)     Patient Active Problem List   Diagnosis Date Noted   Symptomatic bradycardia 05/28/2022   Malnutrition of moderate degree 05/21/2022   Septic shock (HCC) 05/20/2022   Sepsis due to Escherichia coli (E. coli) (HCC) 05/20/2022   SIRS (systemic inflammatory response syndrome) (HCC) 05/19/2022   Cholecystitis, acute 01/03/2020   Acute cholecystitis 01/02/2020   Hypomagnesemia 01/02/2020   Hyperkalemia 01/02/2020   Mixed hyperlipidemia 01/02/2020   Hypothyroidism 01/02/2020   Chest pain 01/02/2020   Lung infiltrate on CT 01/02/2020   Fecal impaction (HCC) 04/19/2019   Acute urinary retention 04/19/2019   Leukocytosis 04/19/2019   Urinary retention 04/19/2019   Hyponatremia 04/19/2019   Hypokalemia 04/19/2019   Voice tremor 10/13/2017   GBS (Guillain-Barre syndrome) (HCC) 07/31/2015   History of Guillain-Barre syndrome 05/02/2015   Anxiety state 05/02/2015   Panic attack 05/02/2015   Essential tremor 05/02/2015   Altered mental status    UTI (urinary tract infection) 04/22/2015   Acute kidney injury (HCC) 04/22/2015  Essential hypertension, benign 11/28/2013   Depression 04/09/2011   Insomnia 04/09/2011    Past Surgical History:  Procedure Laterality Date   CHOLECYSTECTOMY N/A 01/03/2020   Procedure: LAPAROSCOPIC CHOLECYSTECTOMY;  Surgeon: Harriette Bouillon, MD;  Location: [facility] OR;  Service: General;  Laterality: N/A;   FACIAL COSMETIC SURGERY     GALLBLADDER SURGERY     Post Gangerene Gallbladder Removal    HEEL SPUR SURGERY     HEEL SPUR SURGERY     TONSILLECTOMY      Current Outpatient Medications   Medication Sig Dispense Refill   amLODipine (NORVASC) 10 MG tablet Take 10 mg by mouth daily.     aspirin 81 MG tablet Take 81 mg by mouth daily.     atorvastatin (LIPITOR) 40 MG tablet Take 1 tablet (40 mg total) by mouth daily.     chlorthalidone (HYGROTEN) 15 MG tablet Take 1 tablet (15 mg total) by mouth daily. 30 tablet 0   levothyroxine (SYNTHROID) 150 MCG tablet Take 150 mcg by mouth daily.     losartan (COZAAR) 100 MG tablet Take 100 mg by mouth daily.     Melatonin 10 MG TABS Take 10 mg by mouth at bedtime.      Multiple Vitamins-Minerals (MULTIVITAMIN ADULT PO) Take 1 tablet by mouth daily.     primidone (MYSOLINE) 50 MG tablet Take 200 mg by mouth 2 (two) times daily.     sertraline (ZOLOFT) 50 MG tablet Take 50 mg by mouth daily.   0   No current facility-administered medications for this visit.    Allergies as of 07/22/2022 - Review Complete 07/22/2022  Allergen Reaction Noted   Hydroquinone  11/14/2010    Vitals: BP (!) 163/77 (BP Location: Right Arm, Patient Position: Sitting)   Pulse 71   Ht 5\' 1"  (1.549 m)   Wt 109 lb 6.4 oz (49.6 kg) Comment: pt reported  BMI 20.67 kg/m  Last Weight:  Wt Readings from Last 1 Encounters:  07/22/22 109 lb 6.4 oz (49.6 kg)   Last Height:   Ht Readings from Last 1 Encounters:  07/22/22 5\' 1"  (1.549 m)     Physical exam: Exam: Gen: NAD, conversant, thin                   CV: RRR, no MRG. No Carotid Bruits. No peripheral edema, warm, nontender Eyes: Conjunctivae clear without exudates or hemorrhage  Neuro: Detailed Neurologic Exam  Speech:    Speech is tremulous  Cognition:    The patient is oriented to person, place, and time;     recent and remote memory intact;     language fluent;     normal attention, concentration,     fund of knowledge Cranial Nerves:    The pupils are equal, round, and reactive to light. Attempted, pupils too small to visualize. Visual fields are full to finger confrontation. Extraocular  movements are intact. Trigeminal sensation is intact and the muscles of mastication are normal. The face is symmetric. The palate elevates in the midline. Hearing intact. Voice is normal. Shoulder shrug is normal. The tongue has normal motion without fasciculations.   Coordination:    Normal finger to nose and heel to shin with right > left tremor  Gait: nml  Motor Observation: Postural and action tremor right > left Tone:    Normal muscle tone.    Posture:    Posture is normal. normal erect    Strength:    Strength is V/V in  the upper and lower limbs.      Sensation: intact to LT     Reflex Exam:  DTR's:    Deep tendon reflexes in the upper and lower extremities are symmetrical bilaterally.   Toes:    The toes are downgoing bilaterally.   Clonus:    Clonus is absent.    Assessment/Plan:  Patient with debilitating essential tremor with head, chin, voice, limb tremors. I reviewed my notes back from 2017 and 2018, we have not seen her since then, we saw her for essential tremor, at that time she was on primidone, she was on propranolol which was stopped for bradycardia and she was also on gabapentin.  At that time we also thought that she needed to be evaluated for deep brain stimulation for her severe tremor.  she had had tremors for years 10 to 15 years slowly worsening.  We referred her to [facility] in 2018 where she saw Dr. Westley Hummer who stated she would be a reasonable candidate for bilateral thalamic deep brain stimulation, left brain for right sided symptoms would be recommended first followed 3 to 6 months later after recovery for right brain for left-sided symptoms.  He also noted tremor both hands, disabling tremors, head chin tremor, voice tremor. She did not go through with the DBS despite even goin through psychological testing with results: "There are no cognitive or emotional/behavioral contraindications pertaining to the patient's participation in DBS surgery. "  I  explained at this time there is no other medication. She has tried all the medications we use in essential tremor even recently topiramate which she stopped. She is already on a high dose of primidone. Tried gabapentin, cannot use beta blockers due to bradycardia.   [facility], debilitating essential tremor, would like to know if MR-Guided Focused Ultrasound for Treatment of Tremor would be helful for her.We will refer her back to [facility].   Orders Placed This Encounter  Procedures   Ambulatory referral to Neurology   No orders of the defined types were placed in this encounter.   Cc: Myriam Jacobson, FNP,  Combs, Prince Solian, DO  Naomie Dean, MD  [facility] [address] [place], [place] [address]  Phone [phone] Fax [phone]  I spent over 60 minutes of face-to-face and non-face-to-face time with patient on the  1. Essential tremor    diagnosis.  This included previsit chart review, lab review, study review, order entry, electronic health record documentation, patient education on the different diagnostic and therapeutic options, counseling and coordination of care, risks and benefits of management, compliance, or risk factor reduction

## 2022-07-22 NOTE — Patient Instructions (Signed)
We will [facility] to [facility] for any surgical options   Essential Tremor A tremor is trembling or shaking that a person cannot control. Most tremors affect the hands or arms. Tremors can also affect the head, vocal cords, legs, and other parts of the body. Essential tremor is a tremor without a known cause. Usually, it occurs while a person is trying to perform an action. It tends to get worse gradually as a person ages. What are the causes? The cause of this condition is not known, but it often runs in families. What increases the risk? You are more likely to develop this condition if: You have a family member with essential tremor. You are 87 years of age or older. What are the signs or symptoms? The main sign of a tremor is a rhythmic shaking of certain parts of your body that is uncontrolled and unintentional. You may: Have difficulty eating with a spoon or fork. Have difficulty writing. Nod your head up and down or side to side. Have a quivering voice. The shaking may: Get worse over time. Come and go. Be more noticeable on one side of your body. Get worse due to stress, tiredness (fatigue), caffeine, and extreme heat or cold. How is this diagnosed? This condition may be diagnosed based on: Your symptoms and medical history. A physical exam. There is no single test to diagnose an essential tremor. However, your health care provider may order tests to rule out other causes of your condition. These may include: Blood and urine tests. Imaging studies of your brain, such as a CT scan or MRI. How is this treated? Treatment for essential tremor depends on the severity of the condition. Mild tremors may not need treatment if they do not affect your day-to-day life. Severe tremors may need to be treated using one or more of the following options: Medicines. Injections of a substance called botulinum toxin. Procedures such as deep brain stimulation (DBS) implantation or  MRI-guided ultrasound treatment. Lifestyle changes. Occupational or physical therapy. Follow these instructions at home: Lifestyle  Do not use any products that contain nicotine or tobacco. These products include cigarettes, chewing tobacco, and vaping devices, such as e-cigarettes. If you need help quitting, ask your health care provider. Limit your caffeine intake as told by your health care provider. Try to get 8 hours of sleep each night. Find ways to manage your stress that fit your lifestyle and personality. Consider trying meditation or yoga. Try to anticipate stressful situations and allow extra time to manage them. If you are struggling emotionally with the effects of your tremor, consider working with a mental health provider. General instructions Take over-the-counter and prescription medicines only as told by your health care provider. Avoid extreme heat and extreme cold. Keep all follow-up visits. This is important. Visits may include physical therapy visits. Where to find more information [employer] of Neurological Disorders and Stroke: [employer] Contact a health care provider if: You experience any changes in the location or intensity of your tremors. You start having a tremor after starting a new medicine. You have a tremor with other symptoms, such as: Numbness. Tingling. Pain. Weakness. Your tremor gets worse. Your tremor interferes with your daily life. You feel down, blue, or sad for at least 2 weeks in a row. Worrying about your tremor and what other people think about you interferes with your everyday life functions, including relationships, work, or school. Summary Essential tremor is a tremor without a known cause. Usually, it  occurs when you are trying to perform an action. You are more likely to develop this condition if you have a family member with essential tremor. The main sign of a tremor is a rhythmic shaking of certain parts of your  body that is uncontrolled and unintentional. Treatment for essential tremor depends on the severity of the condition. This information is not intended to replace advice given to you by your health care provider. Make sure you discuss any questions you have with your health care provider. Document Revised: 12/21/2020 Document Reviewed: 12/21/2020 Elsevier Patient Education  2023 [employer].

## 2022-07-23 ENCOUNTER — Telehealth: Payer: Self-pay | Admitting: Neurology

## 2022-07-23 ENCOUNTER — Ambulatory Visit: Payer: Medicare HMO | Admitting: Podiatry

## 2022-07-23 ENCOUNTER — Encounter: Payer: Self-pay | Admitting: Podiatry

## 2022-07-23 DIAGNOSIS — M79674 Pain in right toe(s): Secondary | ICD-10-CM | POA: Diagnosis not present

## 2022-07-23 DIAGNOSIS — B351 Tinea unguium: Secondary | ICD-10-CM

## 2022-07-23 DIAGNOSIS — M79675 Pain in left toe(s): Secondary | ICD-10-CM

## 2022-07-23 NOTE — Telephone Encounter (Signed)
Referral Sent to [facility]: Phone: [phone] Fax: [phone]

## 2022-07-23 NOTE — Progress Notes (Signed)
Subjective: Brandy Hicks is a 89 y.o. female patient seen today in office with complaint of painful thickened toenails; unable to trim. Patient denies any changes since last encounter.  Patient has no other pedal complaints at this time.       Patient Active Problem List   Diagnosis Date Noted   Symptomatic bradycardia 05/28/2022   Malnutrition of moderate degree 05/21/2022   Septic shock (HCC) 05/20/2022   Sepsis due to Escherichia coli (E. coli) (HCC) 05/20/2022   SIRS (systemic inflammatory response syndrome) (HCC) 05/19/2022   Cholecystitis, acute 01/03/2020   Acute cholecystitis 01/02/2020   Hypomagnesemia 01/02/2020   Hyperkalemia 01/02/2020   Mixed hyperlipidemia 01/02/2020   Hypothyroidism 01/02/2020   Chest pain 01/02/2020   Lung infiltrate on CT 01/02/2020   Fecal impaction (HCC) 04/19/2019   Acute urinary retention 04/19/2019   Leukocytosis 04/19/2019   Urinary retention 04/19/2019   Hyponatremia 04/19/2019   Hypokalemia 04/19/2019   Voice tremor 10/13/2017   GBS (Guillain-Barre syndrome) (HCC) 07/31/2015   History of Guillain-Barre syndrome 05/02/2015   Anxiety state 05/02/2015   Panic attack 05/02/2015   Essential tremor 05/02/2015   Altered mental status    UTI (urinary tract infection) 04/22/2015   Acute kidney injury (HCC) 04/22/2015   Essential hypertension, benign 11/28/2013   Depression 04/09/2011   Insomnia 04/09/2011    Current Outpatient Medications on File Prior to Visit  Medication Sig Dispense Refill   amLODipine (NORVASC) 10 MG tablet Take 10 mg by mouth daily.     aspirin 81 MG tablet Take 81 mg by mouth daily.     atorvastatin (LIPITOR) 40 MG tablet Take 1 tablet (40 mg total) by mouth daily.     chlorthalidone (HYGROTEN) 15 MG tablet Take 1 tablet (15 mg total) by mouth daily. 30 tablet 0   levothyroxine (SYNTHROID) 150 MCG tablet Take 150 mcg by mouth daily.     losartan (COZAAR) 100 MG tablet Take 100 mg by mouth daily.      Melatonin 10 MG TABS Take 10 mg by mouth at bedtime.      Multiple Vitamins-Minerals (MULTIVITAMIN ADULT PO) Take 1 tablet by mouth daily.     primidone (MYSOLINE) 50 MG tablet Take 200 mg by mouth 2 (two) times daily.     sertraline (ZOLOFT) 50 MG tablet Take 50 mg by mouth daily.   0   No current facility-administered medications on file prior to visit.    Allergies  Allergen Reactions   Hydroquinone     Pinkness and edema of face and eyelids, severe    Objective: Physical Exam  General: Well developed, nourished, no acute distress, awake, alert and oriented x 3  Vascular: Dorsalis pedis artery 1/4 bilateral, Posterior tibial artery 1/4 bilateral, skin temperature warm to warm proximal to distal bilateral lower extremities, no varicosities, pedal hair present bilateral.  Neurological: Gross sensation present via light touch bilateral.   Dermatological: Skin is warm, dry, and supple bilateral, Bilateral hallux nails are short, thick, with moderate subungal debris, all other nails are mildly elongated and minimal debris, no webspace macerations present bilateral, no open lesions present bilateral, no callus/corns/hyperkeratotic tissue present bilateral. No signs of infection bilateral.  Musculoskeletal: No symptomatic boney deformities noted bilateral. Muscular strength within normal limits without painon range of motion. No pain with calf compression bilateral.  Assessment and Plan:  Problem List Items Addressed This Visit   None Visit Diagnoses     Pain due to onychomycosis of toenails of  both feet    -  Primary       -Examined patient.  -Re-Discussed treatment options for painful mycotic nails and pain to toes. -Mechanically debrided and reduced mycotic nails with sterile nail nipper and dremel nail file without incident. -Recommend continue with vicks to nails -Patient to return in 1 year for follow up evaluation or sooner if symptoms worsen.  Louann Sjogren, DPM

## 2022-07-24 ENCOUNTER — Encounter: Payer: Self-pay | Admitting: Internal Medicine

## 2022-07-24 ENCOUNTER — Telehealth: Payer: Self-pay | Admitting: Internal Medicine

## 2022-07-24 ENCOUNTER — Ambulatory Visit: Payer: Medicare HMO | Attending: Internal Medicine | Admitting: Internal Medicine

## 2022-07-24 VITALS — BP 140/70 | HR 77 | Ht 61.0 in | Wt 113.2 lb

## 2022-07-24 DIAGNOSIS — R001 Bradycardia, unspecified: Secondary | ICD-10-CM | POA: Diagnosis not present

## 2022-07-24 DIAGNOSIS — I455 Other specified heart block: Secondary | ICD-10-CM

## 2022-07-24 DIAGNOSIS — I10 Essential (primary) hypertension: Secondary | ICD-10-CM | POA: Diagnosis not present

## 2022-07-24 NOTE — Telephone Encounter (Signed)
Routed to [facility] NP who saw patient at April visit

## 2022-07-24 NOTE — Progress Notes (Signed)
HPI Brandy Hicks is referred by Edd Fabian for evaluation of sinus node dysfunction. The patient is a pleasant 89 yo woman with a h/o symptomatic bradycardia on beta blockers who has also developed SVT. She took metoprolol but then developed HR's in the 30's back in march. When her beta blocker was stopped, she noted marked worsening of her tremors. She wore a cardiac monitor which demonstrated mostly nocturnal brady/Pauses of three and a half seconds and brief episodes of NS SVT, lasting up to rates of 180/min.  Allergies  Allergen Reactions   Hydroquinone     Pinkness and edema of face and eyelids, severe     Current Outpatient Medications  Medication Sig Dispense Refill   amLODipine (NORVASC) 10 MG tablet Take 10 mg by mouth daily.     aspirin 81 MG tablet Take 81 mg by mouth daily.     atorvastatin (LIPITOR) 40 MG tablet Take 1 tablet (40 mg total) by mouth daily.     chlorthalidone (HYGROTEN) 15 MG tablet Take 1 tablet (15 mg total) by mouth daily. 30 tablet 0   levothyroxine (SYNTHROID) 150 MCG tablet Take 150 mcg by mouth daily.     losartan (COZAAR) 100 MG tablet Take 100 mg by mouth daily.     Melatonin 10 MG TABS Take 10 mg by mouth at bedtime.      Multiple Vitamins-Minerals (MULTIVITAMIN ADULT PO) Take 1 tablet by mouth daily.     primidone (MYSOLINE) 50 MG tablet Take 200 mg by mouth 2 (two) times daily.     sertraline (ZOLOFT) 50 MG tablet Take 50 mg by mouth daily.   0   No current facility-administered medications for this visit.     Past Medical History:  Diagnosis Date   Chronic kidney disease, stage 3 unspecified (HCC)    Constipation, unspecified    Depression    Essential hypertension    Essential tremor    Guillain Barr syndrome (HCC)    Hormone replacement therapy    Hyperlipidemia    Hypertension    Hypothyroid    Hypothyroidism, unspecified    Impacted cerumen of both ears    Osteoporosis    Ovarian cyst    Pneumonia 05/2022    Radiculopathy, thoracic region    Retention of urine, unspecified    Rosacea    Seborrheic dermatitis, unspecified    Somnolence    Thoracic spine pain    TIA (transient ischemic attack)    Tremor    Unspecified mood (affective) disorder (HCC)     ROS:   All systems reviewed and negative except as noted in the HPI.   Past Surgical History:  Procedure Laterality Date   CHOLECYSTECTOMY N/A 01/03/2020   Procedure: LAPAROSCOPIC CHOLECYSTECTOMY;  Surgeon: Harriette Bouillon, MD;  Location: [facility] OR;  Service: General;  Laterality: N/A;   FACIAL COSMETIC SURGERY     GALLBLADDER SURGERY     Post [place] Gallbladder Removal    HEEL SPUR SURGERY     HEEL SPUR SURGERY     TONSILLECTOMY       Family History  Problem Relation Age of Onset   Stroke Mother        CABG age 13   CAD Mother    Hypertension Father    CAD Father    Prostate cancer Father    Liver disease Sister    Hypertension Daughter    Thyroid disease Daughter    Breast cancer Daughter  Heart murmur Daughter    Hypertension Daughter    Multiple sclerosis Daughter    Kidney disease Daughter      Social History   Socioeconomic History   Marital status: Widowed    Spouse name: Not on file   Number of children: Not on file   Years of education: Not on file   Highest education level: Not on file  Occupational History   Not on file  Tobacco Use   Smoking status: Former   Smokeless tobacco: Never  Vaping Use   Vaping Use: Never used  Substance and Sexual Activity   Alcohol use: No   Drug use: No   Sexual activity: Not Currently  Other Topics Concern   Not on file  Social History Narrative   Lives at alone in an apartment         Diet: Eat when hungry no special diet       Do you drink/ eat things with caffeine? Yes      Marital status:   Widowed                             What year were you married ? 1955      Do you live in a house, apartment,assistred living, condo, trailer, etc.)? Independent  Living       Is it one or more stories? Elevator      How many persons live in your home ? Self      Do you have any pets in your home ?(please list) 0      Highest Level of education completed: HS      Current or past profession: [employer]      Do you exercise?  No                            Type & how often Blank      ADVANCED DIRECTIVES (Please bring copies)      Do you have a living will? Yes      Do you have a DNR form?  Yes                     If not, do you want to discuss one?       Do you have signed POA?HPOA forms?  Yes               If so, please bring to your appointment      FUNCTIONAL STATUS- To be completed by Spouse / child / Staff       Do you have difficulty bathing or dressing yourself ? No      Do you have difficulty preparing food or eating ?  No      Do you have difficulty managing your mediation ?  No      Do you have difficulty managing your finances ?  No (But granddaughter handles)       Do you have difficulty affording your medication ?  No         Social Determinants of Health   Financial Resource Strain: Not on file  Food Insecurity: No Food Insecurity (05/29/2022)   Hunger Vital Sign    Worried About Running Out of Food in the Last Year: Never true    Ran Out of Food in the Last Year: Never true  Transportation Needs: No Transportation Needs (  05/29/2022)   PRAPARE - Administrator, Civil Service (Medical): No    Lack of Transportation (Non-Medical): No  Physical Activity: Not on file  Stress: Not on file  Social Connections: Not on file  Intimate Partner Violence: Not At Risk (05/29/2022)   Humiliation, Afraid, Rape, and Kick questionnaire    Fear of Current or Ex-Partner: No    Emotionally Abused: No    Physically Abused: No    Sexually Abused: No     BP (!) 140/70   Pulse 77   Ht 5\' 1"  (1.549 m)   Wt 113 lb 3.2 oz (51.3 kg)   SpO2 94%   BMI 21.39 kg/m   Physical Exam:  Well appearing NAD HEENT:  Unremarkable Neck:  No JVD, no thyromegally Lymphatics:  No adenopathy Back:  No CVA tenderness Lungs:  Clear with no wheezes HEART:  Regular rate rhythm, no murmurs, no rubs, no clicks Abd:  soft, positive bowel sounds, no organomegally, no rebound, no guarding Ext:  2 plus pulses, no edema, no cyanosis, no clubbing Skin:  No rashes no nodules Neuro:  CN II through XII intact, motor grossly intact  EKG NSR   Assess/Plan: Tachy-brady - I discussed the treatment options with the patient. She appears to not have palpitations but notes brief episodes where she gets sob, which may well be related to SVT. We cannot restart her beta blocker without back up pacing. I do not expect her problem to improve, rather to worsen and have offered her a PPM. Tremor - this has worsened since stopping her beta blocker. I plan to restart her beta blocker once we get a pPM in place.  Sharlot Gowda Sheera Illingworth,MD

## 2022-07-24 NOTE — Telephone Encounter (Signed)
Patient states she does not need her oxygen any longer.  She states she was told at her appt on April 12th. She states [facility] will not pick it up without an order stating she no longer needs it.  She states stopped using this approx April 12. Icall to [facility] at [phone]., they need a letter that states  "oxygen no longer needed"  can be Faxed to  [phone] Please let patient know once completed so she will be aware of pickup

## 2022-07-24 NOTE — Patient Instructions (Addendum)
Medication Instructions:  Your physician recommends that you continue on your current medications as directed. Please refer to the Current Medication list given to you today.  *If you need a refill on your cardiac medications before your next appointment, please call your pharmacy*  Lab Work: You will have pre-procedure labs drawn on: 08/12/22 CBC and BMET at [facility] on [address]    If you have labs (blood work) drawn today and your tests are completely normal, you will receive your results only by: MyChart Message (if you have MyChart) OR A paper copy in the mail If you have any lab test that is abnormal or we need to change your treatment, we will call you to review the results.  Testing/Procedures: None ordered.  Follow-Up: Dr. Lewayne Bunting has ordered a Medtronic dual chamber pacemaker, for Tachy-Brady Syndrome;  This procedure is scheduled for: 08/25/2022 at 1130 am at [facility].  You will arrive at 930 am on 08/25/22, and check in a [facility] Admitting.  Surgical scrub was given to Pt.    Provider:   Lewayne Bunting, MD{or one of the following Advanced Practice Providers on your designated Care Team:   Francis Dowse, [place] Casimiro Needle "Mardelle Matte" Lanna Poche, [place]  Pacemaker Implantation, Adult Pacemaker implantation is a procedure to place a pacemaker inside the chest. A pacemaker is a small computer that sends electrical signals to the heart and helps the heart beat normally. A pacemaker also stores information about heart rhythms. You may need pacemaker implantation if you have: A slow heartbeat (bradycardia). Loss of consciousness that happens repeatedly (syncope) or repeated episodes of dizziness or light-headedness because of an irregular heart rate. Shortness of breath (dyspnea) due to heart problems. The pacemaker usually attaches to your heart through a wire called a lead. One or two leads may be needed. There are different types of pacemakers: Transvenous pacemaker. This type is  placed under the skin or muscle of your upper chest area. The lead goes through a vein in the chest area to reach the inside of the heart. Epicardial pacemaker. This type is placed under the skin or muscle of your chest or abdomen. The lead goes through your chest to the outside of the heart. Tell a health care provider about: Any allergies you have. All medicines you are taking, including vitamins, herbs, eye drops, creams, and over-the-counter medicines. Any problems you or family members have had with anesthetic medicines. Any blood or bone disorders you have. Any surgeries you have had. Any medical conditions you have. Whether you are pregnant or may be pregnant. What are the risks? Generally, this is a safe procedure. However, problems may occur, including: Infection. Bleeding. Failure of the pacemaker or the lead. Collapse of a lung or bleeding into a lung. Blood clot inside a blood vessel with a lead. Damage to the heart. Infection inside the heart (endocarditis). Allergic reactions to medicines. What happens before the procedure? Staying hydrated Follow instructions from your health care provider about hydration, which may include: Up to 2 hours before the procedure - you may continue to drink clear liquids, such as water, clear fruit juice, black coffee, and plain tea.  Eating and drinking restrictions Follow instructions from your health care provider about eating and drinking, which may include: 8 hours before the procedure - stop eating heavy meals or foods, such as meat, fried foods, or fatty foods. 6 hours before the procedure - stop eating light meals or foods, such as toast or cereal. 6 hours before  the procedure - stop drinking milk or drinks that contain milk. 2 hours before the procedure - stop drinking clear liquids. Medicines Ask your health care provider about: Changing or stopping your regular medicines. This is especially important if you are taking diabetes  medicines or blood thinners. Taking medicines such as aspirin and ibuprofen. These medicines can thin your blood. Do not take these medicines unless your health care provider tells you to take them. Taking over-the-counter medicines, vitamins, herbs, and supplements. Tests You may have: A heart evaluation. This may include: An electrocardiogram (ECG). This involves placing patches on your skin to check your heart rhythm. A chest X-ray. An echocardiogram. This is a test that uses sound waves (ultrasound) to produce an image of the heart. A cardiac rhythm monitor. This is used to record your heart rhythm and any events for a longer period of time. Blood tests. Genetic testing. General instructions Do not use any products that contain nicotine or tobacco for at least 4 weeks before the procedure. These products include cigarettes, e-cigarettes, and chewing tobacco. If you need help quitting, ask your health care provider. Ask your health care provider: How your surgery site will be marked. What steps will be taken to help prevent infection. These steps may include: Removing hair at the surgery site. Washing skin with a germ-killing soap. Receiving antibiotic medicine. Plan to have someone take you home from the hospital or clinic. If you will be going home right after the procedure, plan to have someone with you for 24 hours. What happens during the procedure? An IV will be inserted into one of your veins. You will be given one or more of the following: A medicine to help you relax (sedative). A medicine to numb the area (local anesthetic). A medicine to make you fall asleep (general anesthetic). The next steps vary depending on the type of pacemaker you will be getting. If you are getting a transvenous pacemaker: An incision will be made in your upper chest. A pocket will be made for the pacemaker. It may be placed under the skin or between layers of muscle. The lead will be inserted  into a blood vessel that goes to the heart. While X-rays are taken by an imaging machine (fluoroscopy), the lead will be advanced through the vein to the inside of your heart. The other end of the lead will be tunneled under the skin and attached to the pacemaker. If you are getting an epicardial pacemaker: An incision will be made near your ribs or breastbone (sternum) for the lead. The lead will be attached to the outside of your heart. Another incision will be made in your chest or upper abdomen to create a pocket for the pacemaker. The free end of the lead will be tunneled under the skin and attached to the pacemaker. The transvenous or epicardial pacemaker will be tested. Imaging studies may be done to check the lead position. The incisions will be closed with stitches (sutures), adhesive strips, or skin glue. Bandages (dressings) will be placed over the incisions. The procedure may vary among health care providers and hospitals. What happens after the procedure? Your blood pressure, heart rate, breathing rate, and blood oxygen level will be monitored until you leave the hospital or clinic. You may be given antibiotics. You will be given pain medicine. An ECG and chest X-rays will be done. You may need to wear a continuous type of ECG (Holter monitor) to check your heart rhythm. Your health care provider will  program the pacemaker. If you were given a sedative during the procedure, it can affect you for several hours. Do not drive or operate machinery until your health care provider says that it is safe. You will be given a pacemaker identification card. This card lists the implant date, device model, and manufacturer of your pacemaker. Summary A pacemaker is a small computer that sends electrical signals to the heart and helps the heart beat normally. There are different types of pacemakers. A pacemaker may be placed under the skin or muscle of your chest or abdomen. Follow instructions  from your health care provider about eating and drinking and about taking medicines before the procedure. This information is not intended to replace advice given to you by your health care provider. Make sure you discuss any questions you have with your health care provider. Document Revised: 11/09/2020 Document Reviewed: 02/02/2019 Elsevier Patient Education  2023 [employer].

## 2022-07-24 NOTE — Telephone Encounter (Signed)
Patient is calling stating she went to [facility] for her oxygen and they advised her they need an order from her MD. Please advise.

## 2022-07-25 NOTE — Telephone Encounter (Signed)
Called to tell her to call prescribing MD and have them do it. Pt states that Dr Rennis Golden prescribed it.

## 2022-07-30 ENCOUNTER — Encounter: Payer: Self-pay | Admitting: Internal Medicine

## 2022-07-31 NOTE — Telephone Encounter (Signed)
Late entry-letter faxed to [facility] 5/15

## 2022-08-04 ENCOUNTER — Encounter (HOSPITAL_COMMUNITY): Payer: Self-pay

## 2022-08-04 ENCOUNTER — Emergency Department (HOSPITAL_COMMUNITY)
Admission: EM | Admit: 2022-08-04 | Discharge: 2022-08-05 | Disposition: A | Discharge status: Home / Self Care | Payer: Medicare HMO | Attending: Emergency Medicine | Admitting: Emergency Medicine

## 2022-08-04 DIAGNOSIS — Z7982 Long term (current) use of aspirin: Secondary | ICD-10-CM | POA: Insufficient documentation

## 2022-08-04 DIAGNOSIS — Z79899 Other long term (current) drug therapy: Secondary | ICD-10-CM | POA: Insufficient documentation

## 2022-08-04 DIAGNOSIS — K5900 Constipation, unspecified: Secondary | ICD-10-CM | POA: Diagnosis not present

## 2022-08-04 DIAGNOSIS — R011 Cardiac murmur, unspecified: Secondary | ICD-10-CM | POA: Diagnosis not present

## 2022-08-04 DIAGNOSIS — R1110 Vomiting, unspecified: Secondary | ICD-10-CM | POA: Diagnosis not present

## 2022-08-04 NOTE — ED Triage Notes (Signed)
Pt arrives from home via [facility]. Pt is complaining of constipation and weakness. [facility] took orthostatic vitals signes and found them to be positive. Pt currently has uti and has had first dose of amoxicillin. Pt has not been eating and drinking normally.  Pulse 80 Spo2 96% ra Bp 164/90

## 2022-08-05 ENCOUNTER — Encounter (HOSPITAL_COMMUNITY): Payer: Self-pay

## 2022-08-05 ENCOUNTER — Emergency Department (HOSPITAL_COMMUNITY): Payer: Medicare HMO

## 2022-08-05 LAB — COMPREHENSIVE METABOLIC PANEL
ALT: 26 U/L (ref 0–44)
AST: 37 U/L (ref 15–41)
Albumin: 4.5 g/dL (ref 3.5–5.0)
Alkaline Phosphatase: 70 U/L (ref 38–126)
Anion gap: 13 (ref 5–15)
BUN: 31 mg/dL — ABNORMAL HIGH (ref 8–23)
CO2: 26 mmol/L (ref 22–32)
Calcium: 9.3 mg/dL (ref 8.9–10.3)
Chloride: 98 mmol/L (ref 98–111)
Creatinine, Ser: 1.24 mg/dL — ABNORMAL HIGH (ref 0.44–1.00)
GFR, Estimated: 42 mL/min — ABNORMAL LOW (ref >60)
Glucose, Bld: 150 mg/dL — ABNORMAL HIGH (ref 70–99)
Potassium: 3.2 mmol/L — ABNORMAL LOW (ref 3.5–5.1)
Sodium: 137 mmol/L (ref 135–145)
Total Bilirubin: 0.7 mg/dL (ref 0.3–1.2)
Total Protein: 8.2 g/dL — ABNORMAL HIGH (ref 6.5–8.1)

## 2022-08-05 LAB — CBC WITH DIFFERENTIAL/PLATELET
Abs Immature Granulocytes: 0.09 10*3/uL — ABNORMAL HIGH (ref 0.00–0.07)
Basophils Absolute: 0 10*3/uL (ref 0.0–0.1)
Basophils Relative: 0 %
Eosinophils Absolute: 0 10*3/uL (ref 0.0–0.5)
Eosinophils Relative: 0 %
HCT: 39.5 % (ref 36.0–46.0)
Hemoglobin: 13.5 g/dL (ref 12.0–15.0)
Immature Granulocytes: 1 %
Lymphocytes Relative: 5 %
Lymphs Abs: 0.8 10*3/uL (ref 0.7–4.0)
MCH: 32.8 pg (ref 26.0–34.0)
MCHC: 34.2 g/dL (ref 30.0–36.0)
MCV: 95.9 fL (ref 80.0–100.0)
Monocytes Absolute: 1.1 10*3/uL — ABNORMAL HIGH (ref 0.1–1.0)
Monocytes Relative: 6 %
Neutro Abs: 14.9 10*3/uL — ABNORMAL HIGH (ref 1.7–7.7)
Neutrophils Relative %: 88 %
Platelets: 243 10*3/uL (ref 150–400)
RBC: 4.12 MIL/uL (ref 3.87–5.11)
RDW: 12.4 % (ref 11.5–15.5)
WBC: 16.9 10*3/uL — ABNORMAL HIGH (ref 4.0–10.5)
nRBC: 0 % (ref 0.0–0.2)

## 2022-08-05 MED ORDER — SODIUM CHLORIDE 0.9 % IV BOLUS
500.0000 mL | Freq: Once | INTRAVENOUS | Status: AC
  Administered 2022-08-05: 500 mL via INTRAVENOUS

## 2022-08-05 MED ORDER — SODIUM CHLORIDE (PF) 0.9 % IJ SOLN
INTRAMUSCULAR | Status: AC
  Filled 2022-08-05: qty 50

## 2022-08-05 MED ORDER — AMOXICILLIN-POT CLAVULANATE 875-125 MG PO TABS
1.0000 | ORAL_TABLET | Freq: Once | ORAL | Status: AC
  Administered 2022-08-05: 1 via ORAL
  Filled 2022-08-05: qty 1

## 2022-08-05 MED ORDER — IOHEXOL 300 MG/ML  SOLN
60.0000 mL | Freq: Once | INTRAMUSCULAR | Status: AC | PRN
  Administered 2022-08-05: 60 mL via INTRAVENOUS

## 2022-08-05 NOTE — ED Notes (Addendum)
Pt ambulatory to the restroom attempted to give a urine sample but had a small loose bm in the sample Rn aware and will attempt to get another clean catch.

## 2022-08-05 NOTE — ED Notes (Signed)
  Soap suds enema performed and patient tolerated well.  Patient able to pass several small to medium sized stool pieces.  Patient states she feels better.  Dr Madilyn Hook notified.

## 2022-08-05 NOTE — Discharge Instructions (Addendum)
Start taking miralax over the counter for constipation.  You may also take colace or Senakot with the miralax.    Continue taking your antibiotics for urinary tract infection.

## 2022-08-05 NOTE — ED Provider Notes (Signed)
[place] EMERGENCY DEPARTMENT AT [facility] Provider Note   CSN: [id number] Arrival date & time: 08/04/22  2231     History  Chief Complaint  Patient presents with   Constipation    Brandy Hicks is a 89 y.o. female.  The history is provided by the patient, a significant other and medical records.  Constipation Brandy Hicks is a 89 y.o. female who presents to the Emergency Department complaining of constipation.  She presents to the emergency department for evaluation of constipation that started 7 days ago.  Last BM was 7 days ago.  She also states that she saw her PCP today and was started on Augmentin for UTI.  No associate abdominal pain.  She did have 1 episode of emesis when she presented to the emergency department.  No fever, chest pain, difficulty breathing.  She is still able to pass gas.  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  No associated dysuria.  She has been taking milk of magnesia without significant improvement in her symptoms.     Home Medications Prior to Admission medications   Medication Sig Start Date End Date Taking? Authorizing Provider  amLODipine (NORVASC) 10 MG tablet Take 10 mg by mouth daily.    [provider]  aspirin 81 MG tablet Take 81 mg by mouth daily.    [provider]  atorvastatin (LIPITOR) 40 MG tablet Take 1 tablet (40 mg total) by mouth daily. 05/30/22   Marguerita Merles Latif, DO  chlorthalidone (HYGROTEN) 15 MG tablet Take 1 tablet (15 mg total) by mouth daily. 05/24/22   Marguerita Merles Latif, DO  levothyroxine (SYNTHROID) 150 MCG tablet Take 150 mcg by mouth daily. 02/04/19   [provider]  losartan (COZAAR) 100 MG tablet Take 100 mg by mouth daily.    [provider]  Melatonin 10 MG TABS Take 10 mg by mouth at bedtime.     [provider]  Multiple Vitamins-Minerals (MULTIVITAMIN ADULT PO) Take 1 tablet by mouth  daily.    [provider]  primidone (MYSOLINE) 50 MG tablet Take 200 mg by mouth 2 (two) times daily.    [provider]  sertraline (ZOLOFT) 50 MG tablet Take 50 mg by mouth daily.  05/21/15   [provider]      Allergies    Hydroquinone    Review of Systems   Review of Systems  Gastrointestinal:  Positive for constipation.  All other systems reviewed and are negative.   Physical Exam Updated Vital Signs BP (!) 143/51   Pulse 72   Temp (!) 97.3 F (36.3 C) (Oral)   Resp 20   Ht 5\' 1"  (1.549 m)   Wt 51 kg   SpO2 97%   BMI 21.24 kg/m  Physical Exam Vitals and nursing note reviewed.  Constitutional:      Appearance: She is well-developed.  HENT:     Head: Normocephalic and atraumatic.  Cardiovascular:     Rate and Rhythm: Normal rate and regular rhythm.     Heart sounds: Murmur heard.  Pulmonary:     Effort: Pulmonary effort is normal. No respiratory distress.     Breath sounds: Normal breath sounds.  Abdominal:     Palpations: Abdomen is soft.     Tenderness: There is no abdominal tenderness. There is no guarding or rebound.  Genitourinary:    Comments: Circumferential hemorrhoids that are not thrombosed or  erythematous.  There is stool in the rectal vault that is soft. Musculoskeletal:        General: No swelling or tenderness.  Skin:    General: Skin is warm and dry.  Neurological:     Mental Status: She is alert and oriented to person, place, and time.  Psychiatric:        Behavior: Behavior normal.     ED Results / Procedures / Treatments   Labs (all labs ordered are listed, but only abnormal results are displayed) Labs Reviewed  COMPREHENSIVE METABOLIC PANEL - Abnormal; Notable for the following components:      Result Value   Potassium 3.2 (*)    Glucose, Bld 150 (*)    BUN 31 (*)    Creatinine, Ser 1.24 (*)    Total Protein 8.2 (*)    GFR, Estimated 42 (*)    All other components within normal limits  CBC WITH  DIFFERENTIAL/PLATELET - Abnormal; Notable for the following components:   WBC 16.9 (*)    Neutro Abs 14.9 (*)    Monocytes Absolute 1.1 (*)    Abs Immature Granulocytes 0.09 (*)    All other components within normal limits    EKG EKG Interpretation  Date/Time:  Tuesday Aug 05 2022 02:17:51 EDT Ventricular Rate:  85 PR Interval:  162 QRS Duration: 126 QT Interval:  415 QTC Calculation: 494 R Axis:   91 Text Interpretation: Sinus rhythm RBBB and LPFB Confirmed by Tilden Fossa [id number]) on 08/05/2022 2:25:04 AM  Radiology CT ABDOMEN PELVIS W CONTRAST  Result Date: 08/05/2022 CLINICAL DATA:  Acute and nonlocalized abdominal pain. Recent antibiotics for UTI. EXAM: CT ABDOMEN AND PELVIS WITH CONTRAST TECHNIQUE: Multidetector CT imaging of the abdomen and pelvis was performed using the standard protocol following bolus administration of intravenous contrast. RADIATION DOSE REDUCTION: This exam was performed according to the departmental dose-optimization program which includes automated exposure control, adjustment of the mA and/or kV according to patient size and/or use of iterative reconstruction technique. CONTRAST:  60mL OMNIPAQUE IOHEXOL 300 MG/ML  SOLN COMPARISON:  01/02/2020 FINDINGS: Lower chest:  No acute finding Hepatobiliary: No focal liver abnormality.Cholecystectomy Pancreas: Unremarkable. Spleen: Unremarkable. Adrenals/Urinary Tract: Negative adrenals. No hydronephrosis or stone. 2 cm right renal cyst with simple appearance, no follow-up imaging recommended. Unremarkable bladder. Stomach/Bowel: Multiple segments of colon are distended by stool, including the rectum. Mesorectal fat stranding without notable rectal wall thickening. The appendix is not clearly identified. No pericecal inflammation. Vascular/Lymphatic: Extensive atheromatous calcification of the aorta and iliacs. No mass or adenopathy. Reproductive:No pathologic findings. Other: No ascites or pneumoperitoneum.  Musculoskeletal: No acute finding. Generalized thoracic and lumbar spine degeneration. IMPRESSION: Mesorectal inflammation suggesting proctitis. Moderate stool, including at the rectum. Extensive atherosclerosis. Electronically Signed   By: Tiburcio Pea M.D.   On: 08/05/2022 03:58    Procedures Procedures    Medications Ordered in ED Medications  sodium chloride 0.9 % bolus 500 mL (0 mLs Intravenous Stopped 08/05/22 0327)  iohexol (OMNIPAQUE) 300 MG/ML solution 60 mL (60 mLs Intravenous Contrast Given 08/05/22 0330)  amoxicillin-clavulanate (AUGMENTIN) 875-125 MG per tablet 1 tablet (1 tablet Oral Given 08/05/22 0630)    ED Course/ Medical Decision Making/ A&P                             Medical Decision Making Amount and/or Complexity of Data Reviewed Labs: ordered. Radiology: ordered.  Risk Prescription drug management.   Patient here for  evaluation of 7 days of abdominal pain, did have an episode of emesis at time of ED presentation.  CBC with mild leukocytosis.  CT abdomen pelvis was obtained, which demonstrates moderate stool in the rectum as well as possible proctitis.  She was treated with an enema in the emergency department with good relief of symptoms and a large BM.  No fecal impaction on DRE.  She is already on appropriate antibiotics if she were to have early proctitis.  Recommend she continue her Augmentin.  No evidence of sepsis at this time.  She is able to ambulate without difficulty.  Feel she is stable for discharge home with close return precautions for progressive or concerning symptoms.  Discussed findings of studies and plan with patient's granddaughter and guardian over the phone.        Final Clinical Impression(s) / ED Diagnoses Final diagnoses:  Constipation, unspecified constipation type    Rx / DC Orders ED Discharge Orders     None         Tilden Fossa, MD 08/05/22 [phone]

## 2022-08-12 ENCOUNTER — Ambulatory Visit: Payer: Medicare HMO | Attending: Internal Medicine

## 2022-08-12 ENCOUNTER — Ambulatory Visit: Payer: Medicare Other | Admitting: Neurology

## 2022-08-12 DIAGNOSIS — I10 Essential (primary) hypertension: Secondary | ICD-10-CM

## 2022-08-12 DIAGNOSIS — R001 Bradycardia, unspecified: Secondary | ICD-10-CM

## 2022-08-12 DIAGNOSIS — I455 Other specified heart block: Secondary | ICD-10-CM

## 2022-08-13 LAB — CBC WITH DIFFERENTIAL/PLATELET
Basophils Absolute: 0.1 10*3/uL (ref 0.0–0.2)
Basos: 1 %
EOS (ABSOLUTE): 0.2 10*3/uL (ref 0.0–0.4)
Eos: 3 %
Hematocrit: 36 % (ref 34.0–46.6)
Hemoglobin: 11.9 g/dL (ref 11.1–15.9)
Immature Grans (Abs): 0 10*3/uL (ref 0.0–0.1)
Immature Granulocytes: 0 %
Lymphocytes Absolute: 2.5 10*3/uL (ref 0.7–3.1)
Lymphs: 39 %
MCH: 31.5 pg (ref 26.6–33.0)
MCHC: 33.1 g/dL (ref 31.5–35.7)
MCV: 95 fL (ref 79–97)
Monocytes Absolute: 0.7 10*3/uL (ref 0.1–0.9)
Monocytes: 11 %
Neutrophils Absolute: 3.1 10*3/uL (ref 1.4–7.0)
Neutrophils: 46 %
Platelets: 288 10*3/uL (ref 150–450)
RBC: 3.78 x10E6/uL (ref 3.77–5.28)
RDW: 11.8 % (ref 11.7–15.4)
WBC: 6.5 10*3/uL (ref 3.4–10.8)

## 2022-08-13 LAB — BASIC METABOLIC PANEL
BUN/Creatinine Ratio: 20 (ref 12–28)
BUN: 30 mg/dL — ABNORMAL HIGH (ref 8–27)
CO2: 22 mmol/L (ref 20–29)
Calcium: 9.7 mg/dL (ref 8.7–10.3)
Chloride: 97 mmol/L (ref 96–106)
Creatinine, Ser: 1.51 mg/dL — ABNORMAL HIGH (ref 0.57–1.00)
Glucose: 106 mg/dL — ABNORMAL HIGH (ref 70–99)
Potassium: 3.8 mmol/L (ref 3.5–5.2)
Sodium: 138 mmol/L (ref 134–144)
eGFR: 33 mL/min/{1.73_m2} — ABNORMAL LOW (ref >59)

## 2022-08-22 NOTE — Pre-Procedure Instructions (Signed)
Attempted to call patient regarding procedure instructions.  Left voice mail on the following items: °Arrival time 0930 °Nothing to eat or drink after midnight °No meds AM of procedure °Responsible person to drive you home and stay with you for 24 hrs °Wash with special soap night before and morning of procedure °

## 2022-08-25 ENCOUNTER — Ambulatory Visit (HOSPITAL_COMMUNITY)
Admission: RE | Disposition: A | Discharge status: Home / Self Care | Payer: Self-pay | Source: Home / Self Care | Attending: Internal Medicine

## 2022-08-25 ENCOUNTER — Ambulatory Visit (HOSPITAL_COMMUNITY): Payer: Medicare HMO

## 2022-08-25 ENCOUNTER — Other Ambulatory Visit: Payer: Self-pay

## 2022-08-25 ENCOUNTER — Ambulatory Visit (HOSPITAL_COMMUNITY)
Admission: RE | Admit: 2022-08-25 | Discharge: 2022-08-25 | Disposition: A | Discharge status: Home / Self Care | Payer: Medicare HMO | Attending: Internal Medicine | Admitting: Internal Medicine

## 2022-08-25 DIAGNOSIS — R251 Tremor, unspecified: Secondary | ICD-10-CM | POA: Insufficient documentation

## 2022-08-25 DIAGNOSIS — R001 Bradycardia, unspecified: Secondary | ICD-10-CM

## 2022-08-25 DIAGNOSIS — I495 Sick sinus syndrome: Secondary | ICD-10-CM | POA: Diagnosis not present

## 2022-08-25 HISTORY — PX: PACEMAKER IMPLANT: EP1218

## 2022-08-25 SURGERY — PACEMAKER IMPLANT

## 2022-08-25 MED ORDER — CEFAZOLIN SODIUM-DEXTROSE 2-4 GM/100ML-% IV SOLN: 2.0000 g | INTRAVENOUS | Status: AC

## 2022-08-25 MED ORDER — MIDAZOLAM HCL 5 MG/5ML IJ SOLN
INTRAMUSCULAR | Status: DC | PRN
  Administered 2022-08-25 (×2): 1 mg via INTRAVENOUS

## 2022-08-25 MED ORDER — SODIUM CHLORIDE 0.9 % IV SOLN
INTRAVENOUS | Status: AC
  Administered 2022-08-25: 80 mg
  Filled 2022-08-25: qty 2

## 2022-08-25 MED ORDER — HEPARIN (PORCINE) IN NACL 1000-0.9 UT/500ML-% IV SOLN
INTRAVENOUS | Status: DC | PRN
  Administered 2022-08-25: 500 mL

## 2022-08-25 MED ORDER — SODIUM CHLORIDE 0.9 % IV SOLN: 80.0000 mg | INTRAVENOUS | Status: AC

## 2022-08-25 MED ORDER — CEFAZOLIN SODIUM-DEXTROSE 2-4 GM/100ML-% IV SOLN
INTRAVENOUS | Status: AC
  Administered 2022-08-25: 2 g via INTRAVENOUS
  Filled 2022-08-25: qty 100

## 2022-08-25 MED ORDER — LIDOCAINE HCL (PF) 1 % IJ SOLN
INTRAMUSCULAR | Status: AC
  Filled 2022-08-25: qty 60

## 2022-08-25 MED ORDER — SODIUM CHLORIDE 0.9 % IV SOLN: INTRAVENOUS | Status: DC

## 2022-08-25 MED ORDER — FENTANYL CITRATE (PF) 100 MCG/2ML IJ SOLN
INTRAMUSCULAR | Status: DC | PRN
  Administered 2022-08-25 (×2): 12.5 ug via INTRAVENOUS

## 2022-08-25 MED ORDER — MIDAZOLAM HCL 5 MG/5ML IJ SOLN
INTRAMUSCULAR | Status: AC
  Filled 2022-08-25: qty 5

## 2022-08-25 MED ORDER — CEFAZOLIN SODIUM-DEXTROSE 1-4 GM/50ML-% IV SOLN
1.0000 g | Freq: Once | INTRAVENOUS | Status: AC
  Administered 2022-08-25: 1 g via INTRAVENOUS
  Filled 2022-08-25: qty 50

## 2022-08-25 MED ORDER — CHLORHEXIDINE GLUCONATE 4 % EX SOLN: 4.0000 | Freq: Once | CUTANEOUS | Status: DC

## 2022-08-25 MED ORDER — ONDANSETRON HCL 4 MG/2ML IJ SOLN: 4.0000 mg | Freq: Four times a day (QID) | INTRAMUSCULAR | Status: DC | PRN

## 2022-08-25 MED ORDER — FENTANYL CITRATE (PF) 100 MCG/2ML IJ SOLN
INTRAMUSCULAR | Status: AC
  Filled 2022-08-25: qty 2

## 2022-08-25 MED ORDER — POVIDONE-IODINE 10 % EX SWAB
2.0000 | Freq: Once | CUTANEOUS | Status: AC
  Administered 2022-08-25: 2 via TOPICAL

## 2022-08-25 MED ORDER — ACETAMINOPHEN 325 MG PO TABS: 325.0000 mg | ORAL_TABLET | ORAL | Status: DC | PRN

## 2022-08-25 SURGICAL SUPPLY — 13 items
CABLE SURGICAL S-101-97-12 (CABLE) ×1 IMPLANT
CATH RIGHTSITE C315HIS02 (CATHETERS) IMPLANT
IPG PACE AZUR XT DR MRI W1DR01 (Pacemaker) IMPLANT
KIT MICROPUNCTURE NIT STIFF (SHEATH) IMPLANT
LEAD CAPSURE NOVUS 5076-52CM (Lead) IMPLANT
LEAD SELECT SECURE 3830 383069 (Lead) IMPLANT
PACE AZURE XT DR MRI W1DR01 (Pacemaker) ×1 IMPLANT
PAD DEFIB RADIO PHYSIO CONN (PAD) ×1 IMPLANT
SELECT SECURE 3830 383069 (Lead) ×1 IMPLANT
SHEATH 7FR PRELUDE SNAP 13 (SHEATH) IMPLANT
SLITTER 6232ADJ (MISCELLANEOUS) IMPLANT
TRAY PACEMAKER INSERTION (PACKS) ×1 IMPLANT
WIRE HI TORQ VERSACORE-J 145CM (WIRE) IMPLANT

## 2022-08-25 NOTE — Discharge Instructions (Addendum)
After Your Pacemaker   You have a Medtronic Pacemaker  ACTIVITY Do not lift your arm above shoulder height for 1 week after your procedure. After 7 days, you may progress as below.  You should remove your sling 24 hours after your procedure, unless otherwise instructed by your provider.     Monday September 01, 2022  Tuesday September 02, 2022 Wednesday September 03, 2022 Thursday September 04, 2022   Do not lift, push, pull, or carry anything over 10 pounds with the affected arm until 6 weeks (Monday October 06, 2022 ) after your procedure.   You may drive AFTER your wound check, unless you have been told otherwise by your provider.   Ask your healthcare provider when you can go back to work   INCISION/Dressing If you are on a blood thinner such as Coumadin, Xarelto, Eliquis, Plavix, or Pradaxa please confirm with your provider when this should be resumed.   If large square, outer bandage is left in place, this can be removed after 24 hours from your procedure. Do not remove steri-strips or glue as below.   If a PRESSURE DRESSING (a bulky dressing that usually goes up over your shoulder) was applied or left in place, please follow instructions given by your provider on when to return to have this removed.   Monitor your Pacemaker site for redness, swelling, and drainage. Call the device clinic at [phone] if you experience these symptoms or fever/chills.  If your incision is sealed with Steri-strips or staples, you may shower 7 days after your procedure or when told by your provider. Do not remove the steri-strips or let the shower hit directly on your site. You may wash around your site with soap and water.    If you were discharged in a sling, please do not wear this during the day more than 48 hours after your surgery unless otherwise instructed. This may increase the risk of stiffness and soreness in your shoulder.   Avoid lotions, ointments, or perfumes over your incision until it is  well-healed.  You may use a hot tub or a pool AFTER your wound check appointment if the incision is completely closed.  Pacemaker Alerts:  Some alerts are vibratory and others beep. These are NOT emergencies. Please call our office to let us know. If this occurs at night or on weekends, it can wait until the next business day. Send a remote transmission.  If your device is capable of reading fluid status (for heart failure), you will be offered monthly monitoring to review this with you.   DEVICE MANAGEMENT Remote monitoring is used to monitor your pacemaker from home. This monitoring is scheduled every 91 days by our office. It allows [place] to keep an eye on the functioning of your device to ensure it is working properly. You will routinely see your Electrophysiologist annually (more often if necessary).   You should receive your ID card for your new device in 4-8 weeks. Keep this card with you at all times once received. Consider wearing a medical alert bracelet or necklace.  Your Pacemaker may be MRI compatible. This will be discussed at your next office visit/wound check.  You should avoid contact with strong electric or magnetic fields.   Do not use amateur (ham) radio equipment or electric (arc) welding torches. MP3 player headphones with magnets should not be used. Some devices are safe to use if held at least 12 inches (30 cm) from your Pacemaker. These include power tools, lawn  mowers, and speakers. If you are unsure if something is safe to use, ask your health care provider.  When using your cell phone, hold it to the ear that is on the opposite side from the Pacemaker. Do not leave your cell phone in a pocket over the Pacemaker.  You may safely use electric blankets, heating pads, computers, and microwave ovens.  Call the office right away if: You have chest pain. You feel more short of breath than you have felt before. You feel more light-headed than you have felt before. Your  incision starts to open up.  This information is not intended to replace advice given to you by your health care provider. Make sure you discuss any questions you have with your health care provider.

## 2022-08-25 NOTE — H&P (Signed)
    HPI Brandy Hicks is referred by Jesse Cleaver for evaluation of sinus node dysfunction. The patient is a pleasant 89 yo woman with a h/o symptomatic bradycardia on beta blockers who has also developed SVT. She took metoprolol but then developed HR's in the 30's back in march. When her beta blocker was stopped, she noted marked worsening of her tremors. She wore a cardiac monitor which demonstrated mostly nocturnal brady/Pauses of three and a half seconds and brief episodes of NS SVT, lasting up to rates of 180/min.  Allergies  Allergen Reactions   Hydroquinone     Pinkness and edema of face and eyelids, severe     Current Outpatient Medications  Medication Sig Dispense Refill   amLODipine (NORVASC) 10 MG tablet Take 10 mg by mouth daily.     aspirin 81 MG tablet Take 81 mg by mouth daily.     atorvastatin (LIPITOR) 40 MG tablet Take 1 tablet (40 mg total) by mouth daily.     chlorthalidone (HYGROTEN) 15 MG tablet Take 1 tablet (15 mg total) by mouth daily. 30 tablet 0   levothyroxine (SYNTHROID) 150 MCG tablet Take 150 mcg by mouth daily.     losartan (COZAAR) 100 MG tablet Take 100 mg by mouth daily.     Melatonin 10 MG TABS Take 10 mg by mouth at bedtime.      Multiple Vitamins-Minerals (MULTIVITAMIN ADULT PO) Take 1 tablet by mouth daily.     primidone (MYSOLINE) 50 MG tablet Take 200 mg by mouth 2 (two) times daily.     sertraline (ZOLOFT) 50 MG tablet Take 50 mg by mouth daily.   0   No current facility-administered medications for this visit.     Past Medical History:  Diagnosis Date   Chronic kidney disease, stage 3 unspecified (HCC)    Constipation, unspecified    Depression    Essential hypertension    Essential tremor    Guillain Barr syndrome (HCC)    Hormone replacement therapy    Hyperlipidemia    Hypertension    Hypothyroid    Hypothyroidism, unspecified    Impacted cerumen of both ears    Osteoporosis    Ovarian cyst    Pneumonia 05/2022    Radiculopathy, thoracic region    Retention of urine, unspecified    Rosacea    Seborrheic dermatitis, unspecified    Somnolence    Thoracic spine pain    TIA (transient ischemic attack)    Tremor    Unspecified mood (affective) disorder (HCC)     ROS:   All systems reviewed and negative except as noted in the HPI.   Past Surgical History:  Procedure Laterality Date   CHOLECYSTECTOMY N/A 01/03/2020   Procedure: LAPAROSCOPIC CHOLECYSTECTOMY;  Surgeon: Cornett, Thomas, MD;  Location: [facility] OR;  Service: General;  Laterality: N/A;   FACIAL COSMETIC SURGERY     GALLBLADDER SURGERY     Post [place] Gallbladder Removal    HEEL SPUR SURGERY     HEEL SPUR SURGERY     TONSILLECTOMY       Family History  Problem Relation Age of Onset   Stroke Mother        CABG age 70   CAD Mother    Hypertension Father    CAD Father    Prostate cancer Father    Liver disease Sister    Hypertension Daughter    Thyroid disease Daughter    Breast cancer Daughter      Heart murmur Daughter    Hypertension Daughter    Multiple sclerosis Daughter    Kidney disease Daughter      Social History   Socioeconomic History   Marital status: Widowed    Spouse name: Not on file   Number of children: Not on file   Years of education: Not on file   Highest education level: Not on file  Occupational History   Not on file  Tobacco Use   Smoking status: Former   Smokeless tobacco: Never  Vaping Use   Vaping Use: Never used  Substance and Sexual Activity   Alcohol use: No   Drug use: No   Sexual activity: Not Currently  Other Topics Concern   Not on file  Social History Narrative   Lives at alone in an apartment         Diet: Eat when hungry no special diet       Do you drink/ eat things with caffeine? Yes      Marital status:   Widowed                             What year were you married ? 1955      Do you live in a house, apartment,assistred living, condo, trailer, etc.)? Independent  Living       Is it one or more stories? Elevator      How many persons live in your home ? Self      Do you have any pets in your home ?(please list) 0      Highest Level of education completed: HS      Current or past profession: Banker      Do you exercise?  No                            Type & how often Blank      ADVANCED DIRECTIVES (Please bring copies)      Do you have a living will? Yes      Do you have a DNR form?  Yes                     If not, do you want to discuss one?       Do you have signed POA?HPOA forms?  Yes               If so, please bring to your appointment      FUNCTIONAL STATUS- To be completed by Spouse / child / Staff       Do you have difficulty bathing or dressing yourself ? No      Do you have difficulty preparing food or eating ?  No      Do you have difficulty managing your mediation ?  No      Do you have difficulty managing your finances ?  No (But granddaughter handles)       Do you have difficulty affording your medication ?  No         Social Determinants of Health   Financial Resource Strain: Not on file  Food Insecurity: No Food Insecurity (05/29/2022)   Hunger Vital Sign    Worried About Running Out of Food in the Last Year: Never true    Ran Out of Food in the Last Year: Never true  Transportation Needs: No Transportation Needs (  05/29/2022)   PRAPARE - Transportation    Lack of Transportation (Medical): No    Lack of Transportation (Non-Medical): No  Physical Activity: Not on file  Stress: Not on file  Social Connections: Not on file  Intimate Partner Violence: Not At Risk (05/29/2022)   Humiliation, Afraid, Rape, and Kick questionnaire    Fear of Current or Ex-Partner: No    Emotionally Abused: No    Physically Abused: No    Sexually Abused: No     BP (!) 140/70   Pulse 77   Ht 5' 1" (1.549 m)   Wt 113 lb 3.2 oz (51.3 kg)   SpO2 94%   BMI 21.39 kg/m   Physical Exam:  Well appearing NAD HEENT:  Unremarkable Neck:  No JVD, no thyromegally Lymphatics:  No adenopathy Back:  No CVA tenderness Lungs:  Clear with no wheezes HEART:  Regular rate rhythm, no murmurs, no rubs, no clicks Abd:  soft, positive bowel sounds, no organomegally, no rebound, no guarding Ext:  2 plus pulses, no edema, no cyanosis, no clubbing Skin:  No rashes no nodules Neuro:  CN II through XII intact, motor grossly intact  EKG NSR   Assess/Plan: Tachy-brady - I discussed the treatment options with the patient. She appears to not have palpitations but notes brief episodes where she gets sob, which may well be related to SVT. We cannot restart her beta blocker without back up pacing. I do not expect her problem to improve, rather to worsen and have offered her a PPM. Tremor - this has worsened since stopping her beta blocker. I plan to restart her beta blocker once we get a pPM in place.  Khamron Gellert,MD 

## 2022-08-26 ENCOUNTER — Telehealth: Payer: Self-pay | Admitting: Internal Medicine

## 2022-08-26 ENCOUNTER — Encounter (HOSPITAL_COMMUNITY): Payer: Self-pay | Admitting: Internal Medicine

## 2022-08-26 MED FILL — Lidocaine HCl Local Preservative Free (PF) Inj 1%: INTRAMUSCULAR | Qty: 30 | Status: AC

## 2022-08-26 NOTE — Telephone Encounter (Signed)
Spoke to Dr. Ladona Ridgel who advises patient to keep left arm elevated above heart level as much as possible for the next several days. Advised patient to come in Friday 08/29/22 to be seen in device clinic if not better or worse. Patient called and advised of Dr. Bruna Potter recommendations and voices understanding.   Patient advised it is normal to have some blood on dressing. Advised when dressing is removed later this evening, blood will more than likely be on steri-strips which is expected. Advised as long as incision is not actively bleeding, steri strips should stick to skin. Patient voiced understanding.

## 2022-08-26 NOTE — Telephone Encounter (Signed)
Pt stated she had her pace maker put in yesterday and now her fingers on her left hand are swollen and her bandage has some blood on it from the incision. She'd like a callback just to make sure everything is okay. Please advise

## 2022-08-28 ENCOUNTER — Telehealth: Payer: Self-pay

## 2022-08-28 NOTE — Telephone Encounter (Signed)
Follow-up after same day discharge: Implant date: 08/25/2022 MD: Lewayne Bunting, MD Device: Medtronic [id number] Azure XT DR MRI Location: Left Chest   Wound check visit: 09/04/22 @ 12:00 PM 90 day MD follow-up: 11/27/22 @ 12:00 PM  Remote Transmission received:No, patient is not in [facility]  Dressing/sling removed: yes  Confirm OAC restart on: N/A

## 2022-08-28 NOTE — Telephone Encounter (Signed)
-----   Message from Sheilah Pigeon, [place] sent at 08/25/2022  2:42 PM EDT ----- Same day d/c  MDT PPM GT No a/c

## 2022-09-04 ENCOUNTER — Ambulatory Visit: Payer: Medicare HMO | Attending: Internal Medicine

## 2022-09-04 DIAGNOSIS — R001 Bradycardia, unspecified: Secondary | ICD-10-CM | POA: Diagnosis not present

## 2022-09-04 DIAGNOSIS — G250 Essential tremor: Secondary | ICD-10-CM

## 2022-09-04 DIAGNOSIS — I455 Other specified heart block: Secondary | ICD-10-CM

## 2022-09-04 LAB — CUP PACEART INCLINIC DEVICE CHECK
Battery Remaining Longevity: 169 mo
Battery Voltage: 3.21 V
Brady Statistic AP VP Percent: 0.02 %
Brady Statistic AP VS Percent: 32.31 %
Brady Statistic AS VP Percent: 0.03 %
Brady Statistic AS VS Percent: 67.64 %
Brady Statistic RA Percent Paced: 32.22 %
Brady Statistic RV Percent Paced: 0.05 %
Date Time Interrogation Session: 20240620131303
Implantable Lead Connection Status: 753985
Implantable Lead Connection Status: 753985
Implantable Lead Implant Date: 20240610
Implantable Lead Implant Date: 20240610
Implantable Lead Location: 753859
Implantable Lead Location: 753860
Implantable Lead Model: 3830
Implantable Lead Model: 5076
Implantable Pulse Generator Implant Date: 20240610
Lead Channel Impedance Value: 361 Ohm
Lead Channel Impedance Value: 361 Ohm
Lead Channel Impedance Value: 456 Ohm
Lead Channel Impedance Value: 532 Ohm
Lead Channel Pacing Threshold Amplitude: 0.375 V
Lead Channel Pacing Threshold Amplitude: 0.875 V
Lead Channel Pacing Threshold Pulse Width: 0.4 ms
Lead Channel Pacing Threshold Pulse Width: 0.4 ms
Lead Channel Sensing Intrinsic Amplitude: 1.75 mV
Lead Channel Sensing Intrinsic Amplitude: 1.875 mV
Lead Channel Sensing Intrinsic Amplitude: 13.875 mV
Lead Channel Sensing Intrinsic Amplitude: 15.375 mV
Lead Channel Setting Pacing Amplitude: 3.5 V
Lead Channel Setting Pacing Amplitude: 3.5 V
Lead Channel Setting Pacing Pulse Width: 0.4 ms
Lead Channel Setting Sensing Sensitivity: 1.2 mV
Zone Setting Status: 755011

## 2022-09-04 MED ORDER — PROPRANOLOL HCL ER 60 MG PO CP24
60.0000 mg | ORAL_CAPSULE | Freq: Every day | ORAL | 0 refills | Status: AC
Start: 2022-09-04 — End: ?

## 2022-09-04 NOTE — Progress Notes (Signed)
Wound check appointment. Steri-strips removed. Wound without redness or edema. Incision edges approximated, wound well healed. Normal device function. Thresholds, sensing, and impedances consistent with implant measurements. Device programmed at 3.5V/auto capture programmed on for extra safety margin until 3 month visit. Histogram distribution appropriate for patient and level of activity. No mode switches or high ventricular rates noted. Patient educated about wound care, arm mobility, lifting restrictions. ROV in 3 months with implanting physician.  Added propranolol 60 mg ER PO daily back to Pt's med list per Pt request s/p pacemaker. Advised Pt further refills should come from PCP.

## 2022-09-04 NOTE — Patient Instructions (Addendum)

## 2022-09-08 NOTE — Telephone Encounter (Signed)
Pt is now in our clinic and on a remote schedule.

## 2022-10-16 ENCOUNTER — Ambulatory Visit: Payer: Medicare HMO | Admitting: General Practice

## 2022-10-24 NOTE — Progress Notes (Deleted)
Cardiology Clinic Note   Patient Name: Brandy Hicks Date of Encounter: 10/24/2022  Primary Care Provider:  Theodis Shove, DO Primary Cardiologist:  Chrystie Nose, MD  Patient Profile    Brandy Hicks 89 year old female presents to the clinic today for evaluation of her symptomatic bradycardia.  Past Medical History    Past Medical History:  Diagnosis Date   Chronic kidney disease, stage 3 unspecified (HCC)    Constipation, unspecified    Depression    Essential hypertension    Essential tremor    Guillain Barr syndrome (HCC)    Hormone replacement therapy    Hyperlipidemia    Hypertension    Hypothyroid    Hypothyroidism, unspecified    Impacted cerumen of both ears    Osteoporosis    Ovarian cyst    Pneumonia 05/2022   Radiculopathy, thoracic region    Retention of urine, unspecified    Rosacea    Seborrheic dermatitis, unspecified    Somnolence    Thoracic spine pain    TIA (transient ischemic attack)    Tremor    Unspecified mood (affective) disorder (HCC)    Past Surgical History:  Procedure Laterality Date   CHOLECYSTECTOMY N/A 01/03/2020   Procedure: LAPAROSCOPIC CHOLECYSTECTOMY;  Surgeon: Harriette Bouillon, MD;  Location: [facility] OR;  Service: General;  Laterality: N/A;   FACIAL COSMETIC SURGERY     GALLBLADDER SURGERY     Post Gangerene Gallbladder Removal    HEEL SPUR SURGERY     HEEL SPUR SURGERY     PACEMAKER IMPLANT N/A 08/25/2022   Procedure: PACEMAKER IMPLANT;  Surgeon: Marinus Maw, MD;  Location: [facility] INVASIVE CV LAB;  Service: Cardiovascular;  Laterality: N/A;   TONSILLECTOMY      Allergies  Allergies  Allergen Reactions   Hydroquinone     Pinkness and edema of face and eyelids, severe    History of Present Illness    Brandy Hicks is a PMH of hypertension, essential tremor (previously on propranolol which was stopped due to symptomatic bradycardia) hyperlipidemia, osteoporosis, hypothyroidism,  Guillain-Barr syndrome, CKD stage IIIa, former tobacco use, possible emphysema and history of E. coli sepsis infection (admission 05/19/2022 - 05/24/2022).  She was admitted to the hospital on 05/29/2022 for evaluation of bradycardia.  During that time she reported increased fatigue.  She had been using 60 mg of propranolol for tremors.  Her propranolol was held for washout.  Her telemetry showed overall improvement.  She was noted to return to sinus rhythm with rate of 60-80 and incomplete bundle branch block.  She was noted to have one nonsustained pause lasting 1.8 seconds.  It was felt that there was no indication for pacemaker at that time.  Cardiac event monitor was ordered and showed sinus rhythm with a pauses up to 3.4 seconds and 6 episodes of SVT with the longest being 12 beats.  Her sinus pauses were noted to happen at night.  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  Dr. Rennis Golden is reviewing with EP.  She presented to the clinic 06/27/22 for follow-up evaluation and stated she had occasional episodes of fatigue in the afternoon.  We reviewed her recent hospitalizations.  I also spoke with her daughter on the phone who is an Charity fundraiser.  We reviewed the importance of avoiding beta-blocker medications/AV nodal blocking agents.  We reviewed her cardiac event monitor she expressed understanding.  She presented with her partner [name].  At that time I refered to EP for further evaluation and recommendations.  We reviewed recommendations for presenting to the emergency department.  She was seen in follow-up by Dr. Ladona Ridgel on 07/24/2022.  She underwent pacemaker implant on 08/25/2022.  She presents to the clinic today for follow-up evaluation and states***.  Today she denies chest pain, shortness of breath, lower extremity edema, palpitations, melena, hematuria, hemoptysis, diaphoresis, weakness, presyncope, syncope, orthopnea, and PND.   Home Medications    Prior to Admission medications    Medication Sig Start Date End Date Taking? Authorizing Provider  amLODipine (NORVASC) 10 MG tablet Take 10 mg by mouth daily.    [provider]  aspirin 81 MG tablet Take 81 mg by mouth daily.    [provider]  atorvastatin (LIPITOR) 40 MG tablet Take 1 tablet (40 mg total) by mouth daily. 05/30/22   Marguerita Merles Latif, DO  chlorthalidone (HYGROTEN) 15 MG tablet Take 1 tablet (15 mg total) by mouth daily. 05/24/22   Marguerita Merles Latif, DO  Cholecalciferol (VITAMIN D) 125 MCG (5000 UT) CAPS Take 5,000 Units by mouth daily.    [provider]  feeding supplement (ENSURE ENLIVE / ENSURE PLUS) LIQD Take 237 mLs by mouth 3 (three) times daily between meals. Patient taking differently: Take 237 mLs by mouth 2 (two) times daily between meals. 05/24/22   Sheikh, Omair Latif, DO  fluticasone (FLONASE) 50 MCG/ACT nasal spray Place 2 sprays into both nostrils daily.    [provider]  levothyroxine (SYNTHROID) 150 MCG tablet Take 150 mcg by mouth daily. 02/04/19   [provider]  losartan (COZAAR) 100 MG tablet Take 100 mg by mouth daily.    [provider]  Melatonin 10 MG TABS Take 10 mg by mouth at bedtime.     [provider]  Multiple Vitamins-Minerals (MULTIVITAMIN ADULT PO) Take 1 tablet by mouth daily.    [provider]  primidone (MYSOLINE) 50 MG tablet Take 200 mg by mouth 2 (two) times daily.    [provider]  sertraline (ZOLOFT) 50 MG tablet Take 50 mg by mouth daily.  05/21/15   [provider]    Family History    Family History  Problem Relation Age of Onset   Stroke Mother        CABG age 40   CAD Mother    Hypertension Father    CAD Father    Prostate cancer Father    Liver disease Sister    Hypertension Daughter    Thyroid disease Daughter    Breast cancer Daughter    Heart murmur Daughter    Hypertension Daughter    Multiple sclerosis Daughter    Kidney disease Daughter    She  indicated that her mother is deceased. She indicated that her father is deceased. She indicated that both of her sisters are alive. She indicated that her maternal grandmother is deceased. She indicated that her maternal grandfather is deceased. She indicated that her paternal grandmother is deceased. She indicated that her paternal grandfather is deceased. She indicated that both of her daughters are alive.  Social History    Social History   Socioeconomic History   Marital status: Widowed    Spouse name: Not on file   Number of children: Not on file   Years of education: Not on file   Highest education level: Not on file  Occupational History   Not on file  Tobacco Use   Smoking status:  Former   Smokeless tobacco: Never  Advertising account planner   Vaping status: Never Used  Substance and Sexual Activity   Alcohol use: No   Drug use: No   Sexual activity: Not Currently  Other Topics Concern   Not on file  Social History Narrative   Lives at alone in an apartment         Diet: Eat when hungry no special diet       Do you drink/ eat things with caffeine? Yes      Marital status:   Widowed                             What year were you married ? 1955      Do you live in a house, apartment,assistred living, condo, trailer, etc.)? Independent Living       Is it one or more stories? Elevator      How many persons live in your home ? Self      Do you have any pets in your home ?(please list) 0      Highest Level of education completed: HS      Current or past profession: [employer]      Do you exercise?  No                            Type & how often Blank      ADVANCED DIRECTIVES (Please bring copies)      Do you have a living will? Yes      Do you have a DNR form?  Yes                     If not, do you want to discuss one?       Do you have signed POA?HPOA forms?  Yes               If so, please bring to your appointment      FUNCTIONAL STATUS- To be completed by Spouse / child /  Staff       Do you have difficulty bathing or dressing yourself ? No      Do you have difficulty preparing food or eating ?  No      Do you have difficulty managing your mediation ?  No      Do you have difficulty managing your finances ?  No (But granddaughter handles)       Do you have difficulty affording your medication ?  No         Social Determinants of Health   Financial Resource Strain: Not on file  Food Insecurity: No Food Insecurity (05/29/2022)   Hunger Vital Sign    Worried About Running Out of Food in the Last Year: Never true    Ran Out of Food in the Last Year: Never true  Transportation Needs: No Transportation Needs (05/29/2022)   PRAPARE - Administrator, Civil Service (Medical): No    Lack of Transportation (Non-Medical): No  Physical Activity: Not on file  Stress: Not on file  Social Connections: Not on file  Intimate Partner Violence: Not At Risk (05/29/2022)   Humiliation, Afraid, Rape, and Kick questionnaire    Fear of Current or Ex-Partner: No    Emotionally Abused: No    Physically Abused: No    Sexually Abused: No  Review of Systems    General:  No chills, fever, night sweats or weight changes.  Cardiovascular:  No chest pain, dyspnea on exertion, edema, orthopnea, palpitations, paroxysmal nocturnal dyspnea. Dermatological: No rash, lesions/masses Respiratory: No cough, dyspnea Urologic: No hematuria, dysuria Abdominal:   No nausea, vomiting, diarrhea, bright red blood per rectum, melena, or hematemesis Neurologic:  No visual changes, wkns, changes in mental status. All other systems reviewed and are otherwise negative except as noted above.  Physical Exam    VS:  There were no vitals taken for this visit. , BMI There is no height or weight on file to calculate BMI. GEN: Well nourished, well developed, in no acute distress. HEENT: normal. Neck: Supple, no JVD, carotid bruits, or masses. Cardiac: RRR, no murmurs, rubs, or  gallops. No clubbing, cyanosis, edema.  Radials/DP/PT 2+ and equal bilaterally.  Respiratory:  Respirations regular and unlabored, clear to auscultation bilaterally. GI: Soft, nontender, nondistended, BS + x 4. MS: no deformity or atrophy. Skin: warm and dry, no rash. Neuro:  Strength and sensation are intact.  Essential tremor Psych: Normal affect.  Accessory Clinical Findings    Recent Labs: 05/28/2022: B Natriuretic Peptide 228.7; TSH 5.107 05/30/2022: Magnesium 2.1 08/05/2022: ALT 26 08/12/2022: BUN 30; Creatinine, Ser 1.51; Hemoglobin 11.9; Platelets 288; Potassium 3.8; Sodium 138   Recent Lipid Panel No results found for: "CHOL", "TRIG", "HDL", "CHOLHDL", "VLDL", "LDLCALC", "LDLDIRECT"  No BP recorded.  {Refresh Note OR Click here to enter BP  :1}***    ECG personally reviewed by me today-none today.  Echocardiogram 05/29/2022 1. Left ventricular ejection fraction, by estimation, is 60 to 65%. The  left ventricle has normal function. The left ventricle has no regional  wall motion abnormalities. Left ventricular diastolic parameters are  consistent with Grade I diastolic  dysfunction (impaired relaxation).   2. Right ventricular systolic function is normal. The right ventricular  size is normal.   3. Left atrial size was mildly dilated.   4. The mitral valve is normal in structure. Trivial mitral valve  regurgitation. No evidence of mitral stenosis. Moderate mitral annular  calcification.   5. The aortic valve is tricuspid. There is moderate calcification of the  aortic valve. Aortic valve regurgitation is not visualized. Aortic valve  sclerosis/calcification is present, without any evidence of aortic  stenosis.   6. The inferior vena cava is normal in size with greater than 50%  respiratory variability, suggesting right atrial pressure of 3 mmHg.     Cardiac event monitor 05/30/2022   Patch Wear Time:  13 days and 22 hours (2024-03-15T11:21:05-398 to  2024-03-29T09:45:26-0400)   Patient had a min HR of 29 bpm, max HR of 179 bpm, and avg HR of 63 bpm. Predominant underlying rhythm was Sinus Rhythm. 6 Supraventricular Tachycardia runs occurred, the run with the fastest interval lasting 12 beats with a max rate of 179 bpm, the  longest lasting 10 beats with an avg rate of 112 bpm. 8 Pauses occurred, the longest lasting 3.4 secs (18 bpm). Idioventricular Rhythm was present. Isolated SVEs were rare (<1.0%), SVE Couplets were rare (<1.0%), and SVE Triplets were rare (<1.0%).  Isolated VEs were rare (<1.0%), VE Couplets were rare (<1.0%), and no VE Triplets were present.    Monitor showed sinus rhythm with a 8 sinus pauses up to 3.4 seconds. 6 episodes of SVT were noted up to 12 beats duration with rates up to 179 bpm. The sinus pauses were mostly at night and not patient triggered (asymptomatic).  Findings suggest tachy-brady syndrome. Will d/w EP as to whether PPM is indicated since she is asymptomatic and has washed out of beta blocker.   Chrystie Nose, MD, [facility], FACP  [place]  [facility]  Medical Director of the [facility] Lipid Disorders &  Cardiovascular Risk Reduction Clinic Diplomate of the American Board of Clinical Lipidology Attending Cardiologist   Assessment & Plan   1.  Symptomatic bradycardia-heart rate today 69***.  Underwent pacemaker implantation on 08/25/2022.  Has returned to baseline activities.  Previously noted cardiac event monitor with 8 sinus pauses lasting up to 3.4 seconds.  6 episodes of SVT were also noted.   Follows with EP.  Essential hypertension-BP today 13***4/62.  Amlodipine and chlorthalidone placed on hold during recent admission due to bradycardia. Continue losartan Heart healthy low-sodium diet  CKD stage IIIb-creatinine 1.51 on 08/12/2022. Follows with PCP  Disposition: Follow-up with Dr. Rennis Golden in 6 months.   Thomasene Ripple.  NP-C     10/24/2022, 7:42 AM [facility]  [facility] [address] Office [phone] Fax [phone]    I spent 14*** minutes examining this patient, reviewing medications, and using patient centered shared decision making involving her cardiac care.  Prior to her visit I spent greater than 20 minutes reviewing her past medical history,  medications, and prior cardiac tests.

## 2022-10-27 ENCOUNTER — Ambulatory Visit: Payer: Medicare HMO | Admitting: General Practice

## 2022-10-27 ENCOUNTER — Encounter: Payer: Self-pay | Admitting: Cardiovascular Disease

## 2022-11-27 ENCOUNTER — Encounter: Payer: Self-pay | Admitting: Internal Medicine

## 2022-11-27 ENCOUNTER — Ambulatory Visit: Payer: Medicare HMO | Attending: Internal Medicine | Admitting: Internal Medicine

## 2022-11-27 VITALS — BP 162/68 | HR 62 | Ht 61.0 in | Wt 114.0 lb

## 2022-11-27 DIAGNOSIS — I455 Other specified heart block: Secondary | ICD-10-CM

## 2022-11-27 NOTE — Progress Notes (Signed)
HPI [name] returns today for followup. She is a pleasant 89 yo woman with sinus node dysfunction who also has a h/o SVT and severe tremors. She has used a beta blocker for treatment of her tremors. She has not had syncope. She denies chest pain or sob. She was found to have symptomatic bradycardia and underwent PPM insertion about 3 months ago. She has some fatigue. She admits to being sedentary. Allergies  Allergen Reactions   Hydroquinone     Pinkness and edema of face and eyelids, severe     Current Outpatient Medications  Medication Sig Dispense Refill   acetaminophen (TYLENOL) 500 MG tablet Take 500 mg by mouth 2 (two) times daily.     amLODipine (NORVASC) 10 MG tablet Take 10 mg by mouth daily.     aspirin 81 MG tablet Take 81 mg by mouth daily.     atorvastatin (LIPITOR) 40 MG tablet Take 1 tablet (40 mg total) by mouth daily.     chlorthalidone (HYGROTON) 25 MG tablet Take 25 mg by mouth daily.     Cholecalciferol (VITAMIN D-3) 125 MCG (5000 UT) TABS Take 5,000 Units by mouth daily.     fluticasone (FLONASE) 50 MCG/ACT nasal spray Place 1 spray into both nostrils daily as needed for allergies or rhinitis.     levothyroxine (SYNTHROID) 150 MCG tablet Take 150 mcg by mouth daily.     losartan (COZAAR) 100 MG tablet Take 100 mg by mouth daily.     Melatonin 10 MG TABS Take 2.5 mg by mouth at bedtime as needed (sleep).     mirtazapine (REMERON) 7.5 MG tablet Take 7.5 mg by mouth at bedtime.     Prenatal Vit-Fe Fumarate-FA (PRENATAL PO) Take 1 tablet by mouth daily.     primidone (MYSOLINE) 50 MG tablet Take 200 mg by mouth 2 (two) times daily.     propranolol ER (INDERAL LA) 60 MG 24 hr capsule Take 1 capsule (60 mg total) by mouth daily. 90 capsule 0   sertraline (ZOLOFT) 50 MG tablet Take 50 mg by mouth daily.   0   No current facility-administered medications for this visit.     Past Medical History:  Diagnosis Date   Chronic kidney disease, stage 3  unspecified (HCC)    Constipation, unspecified    Depression    Essential hypertension    Essential tremor    Guillain Barr syndrome (HCC)    Hormone replacement therapy    Hyperlipidemia    Hypertension    Hypothyroid    Hypothyroidism, unspecified    Impacted cerumen of both ears    Osteoporosis    Ovarian cyst    Pneumonia 05/2022   Radiculopathy, thoracic region    Retention of urine, unspecified    Rosacea    Seborrheic dermatitis, unspecified    Somnolence    Thoracic spine pain    TIA (transient ischemic attack)    Tremor    Unspecified mood (affective) disorder (HCC)     ROS:   All systems reviewed and negative except as noted in the HPI.   Past Surgical History:  Procedure Laterality Date   CHOLECYSTECTOMY N/A 01/03/2020   Procedure: LAPAROSCOPIC CHOLECYSTECTOMY;  Surgeon: Harriette Bouillon, MD;  Location: [facility] OR;  Service: General;  Laterality: N/A;   FACIAL COSMETIC SURGERY     GALLBLADDER SURGERY     Post G[place]rene Gallbladder Removal    HEEL SPUR SURGERY     HEEL SPUR  SURGERY     PACEMAKER IMPLANT N/A 08/25/2022   Procedure: PACEMAKER IMPLANT;  Surgeon: Marinus Maw, MD;  [facility]: [facility] INVASIVE CV LAB;  Service: Cardiovascular;  Laterality: N/A;   TONSILLECTOMY       Family History  Problem Relation Age of Onset   Stroke Mother        CABG age 72   CAD Mother    Hypertension Father    CAD Father    Prostate cancer Father    Liver disease Sister    Hypertension Daughter    Thyroid disease Daughter    Breast cancer Daughter    Heart murmur Daughter    Hypertension Daughter    Multiple sclerosis Daughter    Kidney disease Daughter      Social History   Socioeconomic History   Marital status: Widowed    Spouse name: Not on file   Number of children: Not on file   Years of education: Not on file   Highest education level: Not on file  Occupational History   Not on file  Tobacco Use   Smoking status: Former   Smokeless tobacco: Never   Vaping Use   Vaping status: Never Used  Substance and Sexual Activity   Alcohol use: No   Drug use: No   Sexual activity: Not Currently  Other Topics Concern   Not on file  Social History Narrative   Lives at alone in an apartment         Diet: Eat when hungry no special diet       Do you drink/ eat things with caffeine? Yes      Marital status:   Widowed                             What year were you married ? 1955      Do you live in a house, apartment,assistred living, condo, trailer, etc.)? Independent Living       Is it one or more stories? Elevator      How many persons live in your home ? Self      Do you have any pets in your home ?(please list) 0      Highest Level of education completed: HS      Current or past profession: [employer]      Do you exercise?  No                            Type & how often Blank      ADVANCED DIRECTIVES (Please bring copies)      Do you have a living will? Yes      Do you have a DNR form?  Yes                     If not, do you want to discuss one?       Do you have signed POA?HPOA forms?  Yes               If so, please bring to your appointment      FUNCTIONAL STATUS- To be completed by Spouse / child / Staff       Do you have difficulty bathing or dressing yourself ? No      Do you have difficulty preparing food or eating ?  No      Do  you have difficulty managing your mediation ?  No      Do you have difficulty managing your finances ?  No (But granddaughter handles)       Do you have difficulty affording your medication ?  No         Social Determinants of Health   Financial Resource Strain: Not on file  Food Insecurity: No Food Insecurity (05/29/2022)   Hunger Vital Sign    Worried About Running Out of Food in the Last Year: Never true    Ran Out of Food in the Last Year: Never true  Transportation Needs: No Transportation Needs (05/29/2022)   PRAPARE - Administrator, Civil Service (Medical): No     Lack of Transportation (Non-Medical): No  Physical Activity: Not on file  Stress: Not on file  Social Connections: Not on file  Intimate Partner Violence: Not At Risk (05/29/2022)   Humiliation, Afraid, Rape, and Kick questionnaire    Fear of Current or Ex-Partner: No    Emotionally Abused: No    Physically Abused: No    Sexually Abused: No     BP (!) 162/68   Pulse 62   Ht 5\' 1"  (1.549 m)   Wt 114 lb (51.7 kg)   SpO2 96%   BMI 21.54 kg/m   Physical Exam:  Well appearing elderly woman, NAD HEENT: Unremarkable Neck:  No JVD, no thyromegally Lymphatics:  No adenopathy Back:  No CVA tenderness Lungs:  Clear with no wheezes CV: regula rate rhythm, no murmurs, no rubs, no clicks Abd:  soft, positive bowel sounds, no organomegally, no rebound, no guarding Ext:  2 plus pulses, no edema, no cyanosis, no clubbing Skin:  No rashes no nodules Neuro:  CN II through XII intact, motor grossly intact  EKG - sinus rhythm with atrial pacing and RBBB  DEVICE  Normal device function.  See PaceArt for details.   Assess/Plan: Symptomatic tachy-brady syndrome -she is improved s/p PPM insertion.  Tremor - improved now that she is back on her beta blocker  HTN - she is on 4 meds. Her bp is always worse in the MD's office. 4. Fatigue - she admits to being sedentary and I asked her to start walking/riding the stationary bike.    Sharlot Gowda Andriea Hasegawa,MD

## 2022-11-27 NOTE — Patient Instructions (Signed)

## 2022-12-08 ENCOUNTER — Ambulatory Visit: Payer: Medicare HMO

## 2022-12-08 DIAGNOSIS — R001 Bradycardia, unspecified: Secondary | ICD-10-CM

## 2022-12-09 LAB — CUP PACEART REMOTE DEVICE CHECK
Battery Remaining Longevity: 161 mo
Battery Voltage: 3.18 V
Brady Statistic AP VP Percent: 0.04 %
Brady Statistic AP VS Percent: 97.2 %
Brady Statistic AS VP Percent: 0 %
Brady Statistic AS VS Percent: 2.76 %
Brady Statistic RA Percent Paced: 97.14 %
Brady Statistic RV Percent Paced: 0.04 %
Date Time Interrogation Session: 20240922215452
Implantable Lead Connection Status: 753985
Implantable Lead Connection Status: 753985
Implantable Lead Implant Date: 20240610
Implantable Lead Implant Date: 20240610
Implantable Lead Location: 753859
Implantable Lead Location: 753860
Implantable Lead Model: 3830
Implantable Lead Model: 5076
Implantable Pulse Generator Implant Date: 20240610
Lead Channel Impedance Value: 342 Ohm
Lead Channel Impedance Value: 361 Ohm
Lead Channel Impedance Value: 456 Ohm
Lead Channel Impedance Value: 551 Ohm
Lead Channel Pacing Threshold Amplitude: 0.375 V
Lead Channel Pacing Threshold Amplitude: 0.875 V
Lead Channel Pacing Threshold Pulse Width: 0.4 ms
Lead Channel Pacing Threshold Pulse Width: 0.4 ms
Lead Channel Sensing Intrinsic Amplitude: 1 mV
Lead Channel Sensing Intrinsic Amplitude: 1 mV
Lead Channel Sensing Intrinsic Amplitude: 15.875 mV
Lead Channel Sensing Intrinsic Amplitude: 15.875 mV
Lead Channel Setting Pacing Amplitude: 2 V
Lead Channel Setting Pacing Amplitude: 2.5 V
Lead Channel Setting Pacing Pulse Width: 0.4 ms
Lead Channel Setting Sensing Sensitivity: 1.2 mV
Zone Setting Status: 755011

## 2022-12-23 NOTE — Progress Notes (Signed)
Remote pacemaker transmission.   

## 2023-03-09 ENCOUNTER — Ambulatory Visit (INDEPENDENT_AMBULATORY_CARE_PROVIDER_SITE_OTHER): Payer: Medicare HMO

## 2023-03-09 DIAGNOSIS — R001 Bradycardia, unspecified: Secondary | ICD-10-CM

## 2023-03-10 LAB — CUP PACEART REMOTE DEVICE CHECK
Battery Remaining Longevity: 150 mo
Battery Voltage: 3.14 V
Brady Statistic AP VP Percent: 0.03 %
Brady Statistic AP VS Percent: 97.08 %
Brady Statistic AS VP Percent: 0 %
Brady Statistic AS VS Percent: 2.89 %
Brady Statistic RA Percent Paced: 97.02 %
Brady Statistic RV Percent Paced: 0.03 %
Date Time Interrogation Session: 20241223050302
Implantable Lead Connection Status: 753985
Implantable Lead Connection Status: 753985
Implantable Lead Implant Date: 20240610
Implantable Lead Implant Date: 20240610
Implantable Lead Location: 753859
Implantable Lead Location: 753860
Implantable Lead Model: 3830
Implantable Lead Model: 5076
Implantable Pulse Generator Implant Date: 20240610
Lead Channel Impedance Value: 323 Ohm
Lead Channel Impedance Value: 342 Ohm
Lead Channel Impedance Value: 399 Ohm
Lead Channel Impedance Value: 532 Ohm
Lead Channel Pacing Threshold Amplitude: 0.375 V
Lead Channel Pacing Threshold Amplitude: 0.875 V
Lead Channel Pacing Threshold Pulse Width: 0.4 ms
Lead Channel Pacing Threshold Pulse Width: 0.4 ms
Lead Channel Sensing Intrinsic Amplitude: 1.25 mV
Lead Channel Sensing Intrinsic Amplitude: 1.25 mV
Lead Channel Sensing Intrinsic Amplitude: 16.75 mV
Lead Channel Sensing Intrinsic Amplitude: 16.75 mV
Lead Channel Setting Pacing Amplitude: 2 V
Lead Channel Setting Pacing Amplitude: 2.5 V
Lead Channel Setting Pacing Pulse Width: 0.4 ms
Lead Channel Setting Sensing Sensitivity: 1.2 mV
Zone Setting Status: 755011

## 2023-03-22 NOTE — Progress Notes (Signed)
 Cardiology Clinic Note   Patient Name: Brandy Hicks Date of Encounter: 03/23/2023  Primary Care Provider:  Judyth Isaiah Bottcher, DO Primary Cardiologist:  Vinie JAYSON Maxcy, MD  Patient Profile    Brandy Hicks 89 year old female presents to the clinic today for evaluation of her symptomatic bradycardia.  Past Medical History    Past Medical History:  Diagnosis Date   Chronic kidney disease, stage 3 unspecified (HCC)    Constipation, unspecified    Depression    Essential hypertension    Essential tremor    Guillain Barr syndrome (HCC)    Hormone replacement therapy    Hyperlipidemia    Hypertension    Hypothyroid    Hypothyroidism, unspecified    Impacted cerumen of both ears    Osteoporosis    Ovarian cyst    Pneumonia 05/2022   Radiculopathy, thoracic region    Retention of urine, unspecified    Rosacea    Seborrheic dermatitis, unspecified    Somnolence    Thoracic spine pain    TIA (transient ischemic attack)    Tremor    Unspecified mood (affective) disorder (HCC)    Past Surgical History:  Procedure Laterality Date   CHOLECYSTECTOMY N/A 01/03/2020   Procedure: LAPAROSCOPIC CHOLECYSTECTOMY;  Surgeon: Vanderbilt Ned, MD;  Location: [facility];  Service: General;  Laterality: N/A;   FACIAL COSMETIC SURGERY     GALLBLADDER SURGERY     Post Gangerene Gallbladder Removal    HEEL SPUR SURGERY     HEEL SPUR SURGERY     PACEMAKER IMPLANT N/A 08/25/2022   Procedure: PACEMAKER IMPLANT;  Surgeon: Waddell Danelle ORN, MD;  Location: [facility];  Service: Cardiovascular;  Laterality: N/A;   TONSILLECTOMY      Allergies  Allergies  Allergen Reactions   Hydroquinone     Pinkness and edema of face and eyelids, severe    History of Present Illness    Brandy Hicks is a PMH of hypertension, essential tremor (previously on propranolol  which was stopped due to symptomatic bradycardia) hyperlipidemia, osteoporosis, hypothyroidism,  Guillain-Barr syndrome, CKD stage IIIa, former tobacco use, possible emphysema and history of E. coli sepsis infection (admission 05/19/2022 - 05/24/2022).  She was admitted to the hospital on 05/29/2022 for evaluation of bradycardia.  During that time she reported increased fatigue.  She had been using 60 mg of propranolol  for tremors.  Her propranolol  was held for washout.  Her telemetry showed overall improvement.  She was noted to return to sinus rhythm with rate of 60-80 and incomplete bundle branch block.  She was noted to have one nonsustained pause lasting 1.8 seconds.  It was felt that there was no indication for pacemaker at that time.  Cardiac event monitor was ordered and showed sinus rhythm with a pauses up to 3.4 seconds and 6 episodes of SVT with the longest being 12 beats.  Her sinus pauses were noted to happen at night.  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  Dr. Maxcy is reviewing with EP.  She presented to the clinic 06/27/22 for follow-up evaluation and stated she had occasional episodes of fatigue in the afternoon.  We reviewed her recent hospitalizations.  I also spoke with her daughter on the phone who is an [employer].  We reviewed the importance of avoiding beta-blocker medications/AV nodal blocking agents.  We reviewed her cardiac event monitor she expressed understanding.  She presented with her partner Danny.  At that time I refered to EP for further evaluation and recommendations.  We reviewed recommendations for presenting to the emergency department.   She was seen in follow-up by Dr. Waddell on 07/24/2022.  She underwent pacemaker implant on 08/25/2022.  She was again seen by Dr. Waddell on 11/27/2022.  She continued to have severe tremors.  She was using beta-blocker therapy for this.  She denied episodes of syncope.  She denied chest pain or shortness of breath.  She did note some fatigue.  She also admitted to being somewhat sedentary.  She felt improved status post  pacemaker insertion.  She was continued on her blood pressure medication.  Blood pressure was noted to be 162/68.  She admitted to having elevated blood pressure while being in the doctor's office.   She presents to the clinic today for follow-up evaluation and states she was sick prior to her last appointment and contacted the after-hours line.  She received a message about missing her appointment.  This was upsetting to her.  She also had an appointment with Dr. Waddell and did not receive instruction/follow-up to ask about the appointment until 2 days after.  She is concerned that she is getting a reputation for no-shows on her chart.  We reviewed her last clinic visit and blood pressure.  We also discussed her pacemaker downloads.  She and her partner expressed understanding.  She is tolerating her medication well.  Her initial blood pressure today is 144/82 and on recheck is 132/68.  She reports that she has been fairly sedentary and has been trying to gain some weight.  I asked her to slowly increase her walking and increase the calories in her diet.   Today she denies chest pain, shortness of breath, lower extremity edema, palpitations, melena, hematuria, hemoptysis, diaphoresis, weakness, presyncope, syncope, orthopnea, and PND.    Home Medications    Prior to Admission medications   Medication Sig Start Date End Date Taking? Authorizing Provider  amLODipine  (NORVASC ) 10 MG tablet Take 10 mg by mouth daily.    [provider]  aspirin  81 MG tablet Take 81 mg by mouth daily.    [provider]  atorvastatin  (LIPITOR) 40 MG tablet Take 1 tablet (40 mg total) by mouth daily. 05/30/22   Sheikh, Omair Latif, DO  chlorthalidone  (HYGROTEN) 15 MG tablet Take 1 tablet (15 mg total) by mouth daily. 05/24/22   Sherrill Cable Latif, DO  Cholecalciferol  (VITAMIN D ) 125 MCG (5000 UT) CAPS Take 5,000 Units by mouth daily.    [provider]  feeding supplement (ENSURE ENLIVE / ENSURE  PLUS) LIQD Take 237 mLs by mouth 3 (three) times daily between meals. Patient taking differently: Take 237 mLs by mouth 2 (two) times daily between meals. 05/24/22   Sheikh, Omair Latif, DO  fluticasone  (FLONASE ) 50 MCG/ACT nasal spray Place 2 sprays into both nostrils daily.    [provider]  levothyroxine  (SYNTHROID ) 150 MCG tablet Take 150 mcg by mouth daily. 02/04/19   [provider]  losartan  (COZAAR ) 100 MG tablet Take 100 mg by mouth daily.    [provider]  Melatonin 10 MG TABS Take 10 mg by mouth at bedtime.     [provider]  Multiple Vitamins-Minerals (MULTIVITAMIN ADULT PO) Take 1 tablet by mouth daily.    [provider]  primidone  (MYSOLINE ) 50 MG tablet Take 200 mg by mouth 2 (two) times daily.    [provider]  sertraline  (ZOLOFT ) 50 MG tablet  Take 50 mg by mouth daily.  05/21/15   [provider]    Family History    Family History  Problem Relation Age of Onset   Stroke Mother        CABG age 102   CAD Mother    Hypertension Father    CAD Father    Prostate cancer Father    Liver disease Sister    Hypertension Daughter    Thyroid  disease Daughter    Breast cancer Daughter    Heart murmur Daughter    Hypertension Daughter    Multiple sclerosis Daughter    Kidney disease Daughter    She indicated that her mother is deceased. She indicated that her father is deceased. She indicated that both of her sisters are alive. She indicated that her maternal grandmother is deceased. She indicated that her maternal grandfather is deceased. She indicated that her paternal grandmother is deceased. She indicated that her paternal grandfather is deceased. She indicated that both of her daughters are alive.  Social History    Social History   Socioeconomic History   Marital status: Widowed    Spouse name: Not on file   Number of children: Not on file   Years of education: Not on file   Highest education level:  Not on file  Occupational History   Not on file  Tobacco Use   Smoking status: Former   Smokeless tobacco: Never  Vaping Use   Vaping status: Never Used  Substance and Sexual Activity   Alcohol use: No   Drug use: No   Sexual activity: Not Currently  Other Topics Concern   Not on file  Social History Narrative   Lives at alone in an apartment         Diet: Eat when hungry no special diet       Do you drink/ eat things with caffeine? Yes      Marital status:   Widowed                             What year were you married ? 1955      Do you live in a house, apartment,assistred living, condo, trailer, etc.)? Independent Living       Is it one or more stories? Elevator      How many persons live in your home ? Self      Do you have any pets in your home ?(please list) 0      Highest Level of education completed: HS      Current or past profession: [employer]      Do you exercise?  No                            Type & how often Blank      ADVANCED DIRECTIVES (Please bring copies)      Do you have a living will? Yes      Do you have a DNR form?  Yes                     If not, do you want to discuss one?       Do you have signed POA?HPOA forms?  Yes               If so, please bring to your appointment      FUNCTIONAL  STATUS- To be completed by Spouse / child / Staff       Do you have difficulty bathing or dressing yourself ? No      Do you have difficulty preparing food or eating ?  No      Do you have difficulty managing your mediation ?  No      Do you have difficulty managing your finances ?  No (But granddaughter handles)       Do you have difficulty affording your medication ?  No         Social Drivers of Corporate Investment Banker Strain: Not on file  Food Insecurity: No Food Insecurity (05/29/2022)   Hunger Vital Sign    Worried About Running Out of Food in the Last Year: Never true    Ran Out of Food in the Last Year: Never true  Transportation  Needs: No Transportation Needs (05/29/2022)   PRAPARE - Administrator, Civil Service (Medical): No    Lack of Transportation (Non-Medical): No  Physical Activity: Not on file  Stress: Not on file  Social Connections: Not on file  Intimate Partner Violence: Not At Risk (05/29/2022)   Humiliation, Afraid, Rape, and Kick questionnaire    Fear of Current or Ex-Partner: No    Emotionally Abused: No    Physically Abused: No    Sexually Abused: No     Review of Systems    General:  No chills, fever, night sweats or weight changes.  Cardiovascular:  No chest pain, dyspnea on exertion, edema, orthopnea, palpitations, paroxysmal nocturnal dyspnea. Dermatological: No rash, lesions/masses Respiratory: No cough, dyspnea Urologic: No hematuria, dysuria Abdominal:   No nausea, vomiting, diarrhea, bright red blood per rectum, melena, or hematemesis Neurologic:  No visual changes, wkns, changes in mental status. All other systems reviewed and are otherwise negative except as noted above.  Physical Exam    VS:  BP 132/68   Pulse 75   Ht 5' 1 (1.549 m)   Wt 114 lb 9.6 oz (52 kg)   SpO2 96%   BMI 21.65 kg/m  , BMI Body mass index is 21.65 kg/m. GEN: Well nourished, well developed, in no acute distress. HEENT: normal. Neck: Supple, no JVD, carotid bruits, or masses. Cardiac: RRR, no murmurs, rubs, or gallops. No clubbing, cyanosis, edema.  Radials/DP/PT 2+ and equal bilaterally.  Respiratory:  Respirations regular and unlabored, clear to auscultation bilaterally. GI: Soft, nontender, nondistended, BS + x 4. MS: no deformity or atrophy. Skin: warm and dry, no rash. Neuro:  Strength and sensation are intact.  Essential tremor Psych: Normal affect.  Accessory Clinical Findings    Recent Labs: 05/28/2022: B Natriuretic Peptide 228.7; TSH 5.107 05/30/2022: Magnesium  2.1 08/05/2022: ALT 26 08/12/2022: BUN 30; Creatinine, Ser 1.51; Hemoglobin 11.9; Platelets 288; Potassium 3.8; Sodium  138   Recent Lipid Panel No results found for: CHOL, TRIG, HDL, CHOLHDL, VLDL, LDLCALC, LDLDIRECT       ECG personally reviewed by me today-none today.  Echocardiogram 05/29/2022 1. Left ventricular ejection fraction, by estimation, is 60 to 65%. The  left ventricle has normal function. The left ventricle has no regional  wall motion abnormalities. Left ventricular diastolic parameters are  consistent with Grade I diastolic  dysfunction (impaired relaxation).   2. Right ventricular systolic function is normal. The right ventricular  size is normal.   3. Left atrial size was mildly dilated.   4. The mitral valve is normal in structure. Trivial mitral  valve  regurgitation. No evidence of mitral stenosis. Moderate mitral annular  calcification.   5. The aortic valve is tricuspid. There is moderate calcification of the  aortic valve. Aortic valve regurgitation is not visualized. Aortic valve  sclerosis/calcification is present, without any evidence of aortic  stenosis.   6. The inferior vena cava is normal in size with greater than 50%  respiratory variability, suggesting right atrial pressure of 3 mmHg.     Cardiac event monitor 05/30/2022   Patch Wear Time:  13 days and 22 hours (2024-03-15T11:21:05-398 to 2024-03-29T09:45:26-0400)   Patient had a min HR of 29 bpm, max HR of 179 bpm, and avg HR of 63 bpm. Predominant underlying rhythm was Sinus Rhythm. 6 Supraventricular Tachycardia runs occurred, the run with the fastest interval lasting 12 beats with a max rate of 179 bpm, the  longest lasting 10 beats with an avg rate of 112 bpm. 8 Pauses occurred, the longest lasting 3.4 secs (18 bpm). Idioventricular Rhythm was present. Isolated SVEs were rare (<1.0%), SVE Couplets were rare (<1.0%), and SVE Triplets were rare (<1.0%).  Isolated VEs were rare (<1.0%), VE Couplets were rare (<1.0%), and no VE Triplets were present.    Monitor showed sinus rhythm with a 8 sinus  pauses up to 3.4 seconds. 6 episodes of SVT were noted up to 12 beats duration with rates up to 179 bpm. The sinus pauses were mostly at night and not patient triggered (asymptomatic).  Findings suggest tachy-brady syndrome. Will d/w EP as to whether PPM is indicated since she is asymptomatic and has washed out of beta blocker.   Vinie KYM Maxcy, MD, [facility], FACP  [place]  [facility]  Medical Director of the Advanced Lipid Disorders &  Cardiovascular Risk Reduction Clinic Diplomate of the American Board of Clinical Lipidology Attending Cardiologist   Assessment & Plan   Essential hypertension-BP today 132/68.   Maintain blood pressure log Continue current medical therapy Increase physical activity Heart healthy low-sodium diet  Symptomatic bradycardia-heart rate today 75.  Denies further episodes of lightheadedness, presyncope or syncope.  Feels that her energy is better post pacemaker insertion.  PPM implanted on 08/25/2022 Following with Dr. Waddell  Hypothyroidism-Free T4 1.20 on 05/28/2022 Follows with PCP  CKD stage IIIb-creatinine 1.51 on 08/12/2022. Follows with PCP  Disposition: Follow-up with Dr. Maxcy in 6-9 months.   Josefa HERO. Dietrick Barris NP-C     03/23/2023, 3:40 PM  [facility] [address] Office [phone] Fax [phone]    I spent 15 minutes examining this patient, reviewing medications, and using patient centered shared decision making involving her cardiac care.  Prior to her visit I spent greater than 20 minutes reviewing her past medical history,  medications, and prior cardiac tests.

## 2023-03-23 ENCOUNTER — Encounter: Payer: Self-pay | Admitting: General Practice

## 2023-03-23 ENCOUNTER — Ambulatory Visit: Payer: Medicare Other | Attending: General Practice | Admitting: General Practice

## 2023-03-23 VITALS — BP 132/68 | HR 75 | Ht 61.0 in | Wt 114.6 lb

## 2023-03-23 DIAGNOSIS — E038 Other specified hypothyroidism: Secondary | ICD-10-CM

## 2023-03-23 DIAGNOSIS — I10 Essential (primary) hypertension: Secondary | ICD-10-CM

## 2023-03-23 DIAGNOSIS — R001 Bradycardia, unspecified: Secondary | ICD-10-CM | POA: Diagnosis not present

## 2023-03-23 DIAGNOSIS — N1832 Chronic kidney disease, stage 3b: Secondary | ICD-10-CM

## 2023-03-23 NOTE — Patient Instructions (Signed)
 Medication Instructions:  The current medical regimen is effective;  continue present plan and medications as directed. Please refer to the Current Medication list given to you today.  *If you need a refill on your cardiac medications before your next appointment, please call your pharmacy*  Lab Work: [employer]  Other Instructions WALK MORE INCREASE CALORIES IN YOUR DIET   Follow-Up: At [facility], you and your health needs are our priority.  As part of our continuing mission to provide you with exceptional heart care, we have created designated Provider Care Teams.  These Care Teams include your primary Cardiologist (physician) and Advanced Practice Providers (APPs -  Physician Assistants and Nurse Practitioners) who all work together to provide you with the care you need, when you need it.  We recommend signing up for the patient portal called MyChart.  Sign up information is provided on this After Visit Summary.  MyChart is used to connect with patients for Virtual Visits (Telemedicine).  Patients are able to view lab/test results, encounter notes, upcoming appointments, etc.  Non-urgent messages can be sent to your provider as well.   To learn more about what you can do with MyChart, go to [email or web address].    Your next appointment:   6-9 month(s)  Provider:   Vinie JAYSON Maxcy, MD  or Josefa Beauvais, FNP

## 2023-04-20 NOTE — Addendum Note (Signed)
Addended by: Geralyn Flash D on: 04/20/2023 10:55 AM   Modules accepted: Orders

## 2023-04-20 NOTE — Progress Notes (Signed)
 Remote pacemaker transmission.

## 2023-04-27 DIAGNOSIS — E440 Moderate protein-calorie malnutrition: Secondary | ICD-10-CM | POA: Diagnosis not present

## 2023-04-27 DIAGNOSIS — N1832 Chronic kidney disease, stage 3b: Secondary | ICD-10-CM | POA: Diagnosis not present

## 2023-04-27 DIAGNOSIS — F330 Major depressive disorder, recurrent, mild: Secondary | ICD-10-CM | POA: Diagnosis not present

## 2023-04-27 DIAGNOSIS — I708 Atherosclerosis of other arteries: Secondary | ICD-10-CM | POA: Diagnosis not present

## 2023-04-27 DIAGNOSIS — E673 Hypervitaminosis D: Secondary | ICD-10-CM | POA: Diagnosis not present

## 2023-04-27 DIAGNOSIS — I2510 Atherosclerotic heart disease of native coronary artery without angina pectoris: Secondary | ICD-10-CM | POA: Diagnosis not present

## 2023-05-18 DIAGNOSIS — K080 Exfoliation of teeth due to systemic causes: Secondary | ICD-10-CM | POA: Diagnosis not present

## 2023-05-28 DIAGNOSIS — K080 Exfoliation of teeth due to systemic causes: Secondary | ICD-10-CM | POA: Diagnosis not present

## 2023-06-04 DIAGNOSIS — H52203 Unspecified astigmatism, bilateral: Secondary | ICD-10-CM | POA: Diagnosis not present

## 2023-06-08 ENCOUNTER — Ambulatory Visit (INDEPENDENT_AMBULATORY_CARE_PROVIDER_SITE_OTHER): Payer: Medicare HMO

## 2023-06-08 DIAGNOSIS — R001 Bradycardia, unspecified: Secondary | ICD-10-CM | POA: Diagnosis not present

## 2023-06-08 LAB — CUP PACEART REMOTE DEVICE CHECK
Battery Remaining Longevity: 146 mo
Battery Voltage: 3.08 V
Brady Statistic AP VP Percent: 0.03 %
Brady Statistic AP VS Percent: 97.24 %
Brady Statistic AS VP Percent: 0 %
Brady Statistic AS VS Percent: 2.73 %
Brady Statistic RA Percent Paced: 97.17 %
Brady Statistic RV Percent Paced: 0.03 %
Date Time Interrogation Session: 20250324051415
Implantable Lead Connection Status: 753985
Implantable Lead Connection Status: 753985
Implantable Lead Implant Date: 20240610
Implantable Lead Implant Date: 20240610
Implantable Lead Location: 753859
Implantable Lead Location: 753860
Implantable Lead Model: 3830
Implantable Lead Model: 5076
Implantable Pulse Generator Implant Date: 20240610
Lead Channel Impedance Value: 304 Ohm
Lead Channel Impedance Value: 342 Ohm
Lead Channel Impedance Value: 418 Ohm
Lead Channel Impedance Value: 513 Ohm
Lead Channel Pacing Threshold Amplitude: 0.5 V
Lead Channel Pacing Threshold Amplitude: 1.25 V
Lead Channel Pacing Threshold Pulse Width: 0.4 ms
Lead Channel Pacing Threshold Pulse Width: 0.4 ms
Lead Channel Sensing Intrinsic Amplitude: 1.375 mV
Lead Channel Sensing Intrinsic Amplitude: 1.375 mV
Lead Channel Sensing Intrinsic Amplitude: 16.125 mV
Lead Channel Sensing Intrinsic Amplitude: 16.125 mV
Lead Channel Setting Pacing Amplitude: 2 V
Lead Channel Setting Pacing Amplitude: 2.5 V
Lead Channel Setting Pacing Pulse Width: 0.4 ms
Lead Channel Setting Sensing Sensitivity: 1.2 mV
Zone Setting Status: 755011

## 2023-07-27 ENCOUNTER — Ambulatory Visit: Payer: Medicare HMO | Admitting: Podiatry

## 2023-07-27 ENCOUNTER — Encounter: Payer: Self-pay | Admitting: Podiatry

## 2023-07-27 DIAGNOSIS — M79674 Pain in right toe(s): Secondary | ICD-10-CM

## 2023-07-27 DIAGNOSIS — B351 Tinea unguium: Secondary | ICD-10-CM | POA: Diagnosis not present

## 2023-07-27 DIAGNOSIS — M79675 Pain in left toe(s): Secondary | ICD-10-CM | POA: Diagnosis not present

## 2023-07-27 MED ORDER — CICLOPIROX 8 % EX SOLN: Freq: Every day | CUTANEOUS | 11 refills | Status: AC

## 2023-07-27 NOTE — Progress Notes (Unsigned)
 Subjective:  Patient ID: Brandy Hicks, female    DOB: [birth year],  MRN: [id number]  Brandy Hicks presents to clinic today for:  Chief Complaint  Patient presents with   RFC    RM#15 RFC/NAIL FUNGUS both big toes   Patient notes nails are thick, discolored, elongated and painful in shoegear when trying to ambulate.  Patient states that her toenails are fungal.  She would like to discuss treatment options and would like to start treating the fungal toenails.  PCP is Combs, Jannett Mems, DO.  Past Medical History:  Diagnosis Date   Chronic kidney disease, stage 3 unspecified (HCC)    Constipation, unspecified    Depression    Essential hypertension    Essential tremor    Guillain Barr syndrome (HCC)    Hormone replacement therapy    Hyperlipidemia    Hypertension    Hypothyroid    Hypothyroidism, unspecified    Impacted cerumen of both ears    Osteoporosis    Ovarian cyst    Pneumonia 05/2022   Radiculopathy, thoracic region    Retention of urine, unspecified    Rosacea    Seborrheic dermatitis, unspecified    Somnolence    Thoracic spine pain    TIA (transient ischemic attack)    Tremor    Unspecified mood (affective) disorder (HCC)    Past Surgical History:  Procedure Laterality Date   CHOLECYSTECTOMY N/A 01/03/2020   Procedure: LAPAROSCOPIC CHOLECYSTECTOMY;  Surgeon: Sim Dryer, MD;  Location: [facility] OR;  Service: General;  Laterality: N/A;   FACIAL COSMETIC SURGERY     GALLBLADDER SURGERY     Post Gangerene Gallbladder Removal    HEEL SPUR SURGERY     HEEL SPUR SURGERY     PACEMAKER IMPLANT N/A 08/25/2022   Procedure: PACEMAKER IMPLANT;  Surgeon: Tammie Fall, MD;  [facility]: [facility];  Service: Cardiovascular;  Laterality: N/A;   TONSILLECTOMY     Allergies  Allergen Reactions   Hydroquinone     Pinkness and edema of face and eyelids, severe    Review of Systems: Negative except as noted in the HPI.  Objective:   Brandy Hicks is a pleasant 89 y.o. female in NAD. AAO x 3.  Vascular Examination: Capillary refill time is 3-5 seconds to toes bilateral. Palpable pedal pulses b/l LE. Digital hair present b/l.  Skin temperature gradient WNL b/l. No varicosities b/l. No cyanosis noted b/l.   Dermatological Examination: Pedal skin with normal turgor, texture and tone b/l. No open wounds. No interdigital macerations b/l. Toenails x10 are 3mm thick, discolored, dystrophic with subungual debris. There is pain with compression of the nail plates.  They are elongated x10  Assessment/Plan: 1. Pain due to onychomycosis of toenails of both feet     Meds ordered this encounter  Medications   ciclopirox (PENLAC) 8 % solution    Sig: Apply topically at bedtime. Apply thin layer over nail. Apply daily over previous coat. Remove weekly with polish remover.    Dispense:  6.6 mL    Refill:  11   The mycotic toenails were sharply debrided x10 with sterile nail nippers and a power debriding burr to decrease bulk/thickness and length.    Discussed starting the patient on topical prescription ciclopirox 8% solution to be applied to the toenails at bedtime.  This was sent to her pharmacy.  We will need to recheck in 3 to 4 months to  evaluate how much clear nail growth has occurred.  Return for 4-6 months for fungal nail recheck and RFC.   Joe Murders, DPM, FACFAS [facility]     [address] [place], [place] [address]                Office [phone]  Fax [phone]

## 2023-07-28 NOTE — Progress Notes (Signed)
 Remote pacemaker transmission.

## 2023-09-07 ENCOUNTER — Ambulatory Visit (INDEPENDENT_AMBULATORY_CARE_PROVIDER_SITE_OTHER): Payer: Medicare HMO

## 2023-09-07 DIAGNOSIS — R001 Bradycardia, unspecified: Secondary | ICD-10-CM

## 2023-09-09 LAB — CUP PACEART REMOTE DEVICE CHECK
Battery Remaining Longevity: 143 mo
Battery Voltage: 3.04 V
Brady Statistic AP VP Percent: 0.03 %
Brady Statistic AP VS Percent: 97.88 %
Brady Statistic AS VP Percent: 0 %
Brady Statistic AS VS Percent: 2.09 %
Brady Statistic RA Percent Paced: 97.78 %
Brady Statistic RV Percent Paced: 0.03 %
Date Time Interrogation Session: 20250623010107
Implantable Lead Connection Status: 753985
Implantable Lead Connection Status: 753985
Implantable Lead Implant Date: 20240610
Implantable Lead Implant Date: 20240610
Implantable Lead Location: 753859
Implantable Lead Location: 753860
Implantable Lead Model: 3830
Implantable Lead Model: 5076
Implantable Pulse Generator Implant Date: 20240610
Lead Channel Impedance Value: 342 Ohm
Lead Channel Impedance Value: 342 Ohm
Lead Channel Impedance Value: 456 Ohm
Lead Channel Impedance Value: 513 Ohm
Lead Channel Pacing Threshold Amplitude: 0.5 V
Lead Channel Pacing Threshold Amplitude: 1.375 V
Lead Channel Pacing Threshold Pulse Width: 0.4 ms
Lead Channel Pacing Threshold Pulse Width: 0.4 ms
Lead Channel Sensing Intrinsic Amplitude: 1.625 mV
Lead Channel Sensing Intrinsic Amplitude: 1.625 mV
Lead Channel Sensing Intrinsic Amplitude: 15.625 mV
Lead Channel Sensing Intrinsic Amplitude: 15.625 mV
Lead Channel Setting Pacing Amplitude: 2 V
Lead Channel Setting Pacing Amplitude: 2.5 V
Lead Channel Setting Pacing Pulse Width: 0.4 ms
Lead Channel Setting Sensing Sensitivity: 1.2 mV
Zone Setting Status: 755011

## 2023-09-13 ENCOUNTER — Ambulatory Visit: Payer: Self-pay | Admitting: Internal Medicine

## 2023-09-21 DIAGNOSIS — L821 Other seborrheic keratosis: Secondary | ICD-10-CM | POA: Diagnosis not present

## 2023-09-21 DIAGNOSIS — L2989 Other pruritus: Secondary | ICD-10-CM | POA: Diagnosis not present

## 2023-09-21 DIAGNOSIS — D225 Melanocytic nevi of trunk: Secondary | ICD-10-CM | POA: Diagnosis not present

## 2023-09-21 DIAGNOSIS — D2262 Melanocytic nevi of left upper limb, including shoulder: Secondary | ICD-10-CM | POA: Diagnosis not present

## 2023-09-21 NOTE — Progress Notes (Unsigned)
 Cardiology Clinic Note   Patient Name: Brandy Hicks Date of Encounter: 09/22/2023  Primary Care Provider:  Judyth Isaiah Bottcher, DO Primary Cardiologist:  Brandy JAYSON Maxcy, MD  Patient Profile    Brandy Hicks 89 year old female presents to the clinic today for evaluation of her symptomatic bradycardia.  Past Medical History    Past Medical History:  Diagnosis Date   Chronic kidney disease, stage 3 unspecified (HCC)    Constipation, unspecified    Depression    Essential hypertension    Essential tremor    Guillain Barr syndrome (HCC)    Hormone replacement therapy    Hyperlipidemia    Hypertension    Hypothyroid    Hypothyroidism, unspecified    Impacted cerumen of both ears    Osteoporosis    Ovarian cyst    Pneumonia 05/2022   Radiculopathy, thoracic region    Retention of urine, unspecified    Rosacea    Seborrheic dermatitis, unspecified    Somnolence    Thoracic spine pain    TIA (transient ischemic attack)    Tremor    Unspecified mood (affective) disorder (HCC)    Past Surgical History:  Procedure Laterality Date   CHOLECYSTECTOMY N/A 01/03/2020   Procedure: LAPAROSCOPIC CHOLECYSTECTOMY;  Surgeon: Brandy Ned, MD;  Location: [facility] OR;  Service: General;  Laterality: N/A;   FACIAL COSMETIC SURGERY     GALLBLADDER SURGERY     Post Gangerene Gallbladder Removal    HEEL SPUR SURGERY     HEEL SPUR SURGERY     PACEMAKER IMPLANT N/A 08/25/2022   Procedure: PACEMAKER IMPLANT;  Surgeon: Brandy Danelle ORN, MD;  Location: [facility] INVASIVE CV [facility];  Service: Cardiovascular;  Laterality: N/A;   TONSILLECTOMY      Allergies  Allergies  Allergen Reactions   Hydroquinone     Pinkness and edema of face and eyelids, severe    History of Present Illness    Brandy Hicks is a PMH of hypertension, essential tremor (previously on propranolol  which was stopped due to symptomatic bradycardia) hyperlipidemia, osteoporosis, hypothyroidism,  Guillain-Barr syndrome, CKD stage IIIa, former tobacco use, possible emphysema and history of E. coli sepsis infection (admission 05/19/2022 - 05/24/2022).  She was admitted to the hospital on 05/29/2022 for evaluation of bradycardia.  During that time she reported increased fatigue.  She had been using 60 mg of propranolol  for tremors.  Her propranolol  was held for washout.  Her telemetry showed overall improvement.  She was noted to return to sinus rhythm with rate of 60-80 and incomplete bundle branch block.  She was noted to have one nonsustained pause lasting 1.8 seconds.  It was felt that there was no indication for pacemaker at that time.  Cardiac event monitor was ordered and showed sinus rhythm with a pauses up to 3.4 seconds and 6 episodes of SVT with the longest being 12 beats.  Her sinus pauses were noted to happen at night.  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  Dr. Maxcy is reviewing with EP.  She presented to the clinic 06/27/22 for follow-up evaluation and stated she had occasional episodes of fatigue in the afternoon.  We reviewed her recent hospitalizations.  I also spoke with her daughter on the phone who is an Charity fundraiser.  We reviewed the importance of avoiding beta-blocker medications/AV nodal blocking agents.  We reviewed her cardiac event monitor she expressed understanding.  She presented with her partner Brandy Hicks.  At that time I refered to EP for further evaluation and recommendations.  We reviewed recommendations for presenting to the emergency department.   She was seen in follow-up by Dr. Waddell on 07/24/2022.  She underwent pacemaker implant on 08/25/2022.  She was again seen by Dr. Waddell on 11/27/2022.  She continued to have severe tremors.  She was using beta-blocker therapy for this.  She denied episodes of syncope.  She denied chest pain or shortness of breath.  She did note some fatigue.  She also admitted to being somewhat sedentary.  She felt improved status post  pacemaker insertion.  She was continued on her blood pressure medication.  Blood pressure was noted to be 162/68.  She admitted to having elevated blood pressure while being in the doctor's office.   She presented to the clinic 03/23/23 for follow-up evaluation and stated she was sick prior to her last appointment and contacted the after-hours line.  She received a message about missing her appointment.  This was upsetting to her.  She also had an appointment with Dr. Waddell and did not receive instruction/follow-up to ask about the appointment until 2 days after.  She was concerned that she was getting a reputation for no-shows on her chart.  We reviewed her last clinic visit and blood pressure.  We also discussed her pacemaker downloads.  She and her partner expressed understanding.  She was tolerating her medication well.  Her initial blood pressure was 144/82 and on recheck was 132/68.  She reported that she had been fairly sedentary and had been trying to gain some weight.  I asked her to slowly increase her walking and increase the calories in her diet.  She presents to clinic today for follow-up evaluation states her primary doctor is leaving the area.  She has established with a new primary care physician.  Initially her blood pressure is elevated at 162/80.  On recheck it is 148/72.  She reports compliance with her medications.  She notes that at home her blood pressure runs routinely in the 120s over 70s.  She notes that she is not been very physically active.  We reviewed the importance of increased physical activity and continuing low-sodium diet.  I will continue her current medication therapy, give her the salty 6 diet information and plan follow-up in 6 months..   Today she denies chest pain, shortness of breath, lower extremity edema, palpitations, melena, hematuria, hemoptysis, diaphoresis, weakness, presyncope, syncope, orthopnea, and PND.    Home Medications    Prior to Admission  medications   Medication Sig Start Date End Date Taking? Authorizing Provider  amLODipine  (NORVASC ) 10 MG tablet Take 10 mg by mouth daily.    [provider]  aspirin  81 MG tablet Take 81 mg by mouth daily.    [provider]  atorvastatin  (LIPITOR) 40 MG tablet Take 1 tablet (40 mg total) by mouth daily. 05/30/22   Sherrill Cable Latif, DO  chlorthalidone  (HYGROTEN) 15 MG tablet Take 1 tablet (15 mg total) by mouth daily. 05/24/22   Sherrill Cable Latif, DO  Cholecalciferol  (VITAMIN D ) 125 MCG (5000 UT) CAPS Take 5,000 Units by mouth daily.    [provider]  feeding supplement (ENSURE ENLIVE / ENSURE PLUS) LIQD Take 237 mLs by mouth 3 (three) times daily between meals. Patient taking differently: Take 237 mLs by mouth 2 (two) times daily between meals. 05/24/22   Sherrill Cable Latif, DO  fluticasone  (FLONASE ) 50 MCG/ACT nasal spray Place 2 sprays into  both nostrils daily.    [provider]  levothyroxine  (SYNTHROID ) 150 MCG tablet Take 150 mcg by mouth daily. 02/04/19   [provider]  losartan  (COZAAR ) 100 MG tablet Take 100 mg by mouth daily.    [provider]  Melatonin 10 MG TABS Take 10 mg by mouth at bedtime.     [provider]  Multiple Vitamins-Minerals (MULTIVITAMIN ADULT PO) Take 1 tablet by mouth daily.    [provider]  primidone  (MYSOLINE ) 50 MG tablet Take 200 mg by mouth 2 (two) times daily.    [provider]  sertraline  (ZOLOFT ) 50 MG tablet Take 50 mg by mouth daily.  05/21/15   [provider]    Family History    Family History  Problem Relation Age of Onset   Stroke Mother        CABG age 48   CAD Mother    Hypertension Father    CAD Father    Prostate cancer Father    Liver disease Sister    Hypertension Daughter    Thyroid  disease Daughter    Breast cancer Daughter    Heart murmur Daughter    Hypertension Daughter    Multiple sclerosis Daughter    Kidney disease  Daughter    She indicated that her mother is deceased. She indicated that her father is deceased. She indicated that both of her sisters are alive. She indicated that her maternal grandmother is deceased. She indicated that her maternal grandfather is deceased. She indicated that her paternal grandmother is deceased. She indicated that her paternal grandfather is deceased. She indicated that both of her daughters are alive.  Social History    Social History   Socioeconomic History   Marital status: Widowed    Spouse name: Not on file   Number of children: Not on file   Years of education: Not on file   Highest education level: Not on file  Occupational History   Not on file  Tobacco Use   Smoking status: Former   Smokeless tobacco: Never  Vaping Use   Vaping status: Never Used  Substance and Sexual Activity   Alcohol use: No   Drug use: No   Sexual activity: Not Currently  Other Topics Concern   Not on file  Social History Narrative   Lives at alone in an apartment         Diet: Eat when hungry no special diet       Do you drink/ eat things with caffeine? Yes      Marital status:   Widowed                             What year were you married ? 1955      Do you live in a house, apartment,assistred living, condo, trailer, etc.)? Independent Living       Is it one or more stories? Elevator      How many persons live in your home ? Self      Do you have any pets in your home ?(please list) 0      Highest Level of education completed: HS      Current or past profession: [employer]      Do you exercise?  No  Type & how often Blank      ADVANCED DIRECTIVES (Please bring copies)      Do you have a living will? Yes      Do you have a DNR form?  Yes                     If not, do you want to discuss one?       Do you have signed POA?HPOA forms?  Yes               If so, please bring to your appointment      FUNCTIONAL STATUS- To be completed by  Spouse / child / Staff       Do you have difficulty bathing or dressing yourself ? No      Do you have difficulty preparing food or eating ?  No      Do you have difficulty managing your mediation ?  No      Do you have difficulty managing your finances ?  No (But granddaughter handles)       Do you have difficulty affording your medication ?  No         Social Drivers of Corporate investment banker Strain: Not on file  Food Insecurity: No Food Insecurity (05/29/2022)   Hunger Vital Sign    Worried About Running Out of Food in the Last Year: Never true    Ran Out of Food in the Last Year: Never true  Transportation Needs: No Transportation Needs (05/29/2022)   PRAPARE - Administrator, Civil Service (Medical): No    Lack of Transportation (Non-Medical): No  Physical Activity: Not on file  Stress: Not on file  Social Connections: Not on file  Intimate Partner Violence: Not At Risk (05/29/2022)   Humiliation, Afraid, Rape, and Kick questionnaire    Fear of Current or Ex-Partner: No    Emotionally Abused: No    Physically Abused: No    Sexually Abused: No     Review of Systems    General:  No chills, fever, night sweats or weight changes.  Cardiovascular:  No chest pain, dyspnea on exertion, edema, orthopnea, palpitations, paroxysmal nocturnal dyspnea. Dermatological: No rash, lesions/masses Respiratory: No cough, dyspnea Urologic: No hematuria, dysuria Abdominal:   No nausea, vomiting, diarrhea, bright red blood per rectum, melena, or hematemesis Neurologic:  No visual changes, wkns, changes in mental status. All other systems reviewed and are otherwise negative except as noted above.  Physical Exam    VS:  BP (!) 148/72   Pulse 79   Ht 5' 1 (1.549 m)   Wt 115 lb 12.8 oz (52.5 kg)   SpO2 95%   BMI 21.88 kg/m  , BMI Body mass index is 21.88 kg/m. GEN: Well nourished, well developed, in no acute distress. HEENT: normal. Neck: Supple, no JVD, carotid  bruits, or masses. Cardiac: RRR, no murmurs, rubs, or gallops. No clubbing, cyanosis, edema.  Radials/DP/PT 2+ and equal bilaterally.  Respiratory:  Respirations regular and unlabored, clear to auscultation bilaterally. GI: Soft, nontender, nondistended, BS + x 4. MS: no deformity or atrophy. Skin: warm and dry, no rash. Neuro:  Strength and sensation are intact.  Essential tremor Psych: Normal affect.  Accessory Clinical Findings    Recent Labs: No results found for requested labs within last 365 days.   Recent Lipid Panel No results found for: CHOL, TRIG, HDL, CHOLHDL, VLDL, LDLCALC, LDLDIRECT  HYPERTENSION CONTROL  Vitals:   09/22/23 1336 09/22/23 1354  BP: (!) 162/80 (!) 148/72    The patient's blood pressure is elevated above target today.  In order to address the patient's elevated BP: Blood pressure will be monitored at home to determine if medication changes need to be made.; The blood pressure is usually elevated in clinic.  Blood pressures monitored at home have been optimal.       ECG personally reviewed by me today-none today.  Echocardiogram 05/29/2022 1. Left ventricular ejection fraction, by estimation, is 60 to 65%. The  left ventricle has normal function. The left ventricle has no regional  wall motion abnormalities. Left ventricular diastolic parameters are  consistent with Grade I diastolic  dysfunction (impaired relaxation).   2. Right ventricular systolic function is normal. The right ventricular  size is normal.   3. Left atrial size was mildly dilated.   4. The mitral valve is normal in structure. Trivial mitral valve  regurgitation. No evidence of mitral stenosis. Moderate mitral annular  calcification.   5. The aortic valve is tricuspid. There is moderate calcification of the  aortic valve. Aortic valve regurgitation is not visualized. Aortic valve  sclerosis/calcification is present, without any evidence of aortic  stenosis.   6.  The inferior vena cava is normal in size with greater than 50%  respiratory variability, suggesting right atrial pressure of 3 mmHg.     Cardiac event monitor 05/30/2022   Patch Wear Time:  13 days and 22 hours (2024-03-15T11:21:05-398 to 2024-03-29T09:45:26-0400)   Patient had a min HR of 29 bpm, max HR of 179 bpm, and avg HR of 63 bpm. Predominant underlying rhythm was Sinus Rhythm. 6 Supraventricular Tachycardia runs occurred, the run with the fastest interval lasting 12 beats with a max rate of 179 bpm, the  longest lasting 10 beats with an avg rate of 112 bpm. 8 Pauses occurred, the longest lasting 3.4 secs (18 bpm). Idioventricular Rhythm was present. Isolated SVEs were rare (<1.0%), SVE Couplets were rare (<1.0%), and SVE Triplets were rare (<1.0%).  Isolated VEs were rare (<1.0%), VE Couplets were rare (<1.0%), and no VE Triplets were present.    Monitor showed sinus rhythm with a 8 sinus pauses up to 3.4 seconds. 6 episodes of SVT were noted up to 12 beats duration with rates up to 179 bpm. The sinus pauses were mostly at night and not patient triggered (asymptomatic).  Findings suggest tachy-brady syndrome. Will d/w EP as to whether PPM is indicated since she is asymptomatic and has washed out of beta blocker.   Brandy KYM Maxcy, MD, [facility], FACP  [place]  [facility]  Medical Director of the Advanced Lipid Disorders &  Cardiovascular Risk Reduction Clinic Diplomate of the American Board of Clinical Lipidology Attending Cardiologist   Assessment & Plan   Essential hypertension-BP today 148/72. At home 120's over 70's   Maintain blood pressure log Continue amlodipine , chlorthalidone , losartan  Increase physical activity Heart healthy low-sodium diet request CBC, BMP from primary care office  Symptomatic bradycardia-heart rate today 79 bpm  Denies further episodes of lightheadedness, presyncope or syncope.  Feels that her energy is better post pacemaker insertion.  PPM  implanted on 08/25/2022.  Device last interrogated on 09/07/2023.  Battery and lead parameter stable.  Device was programmed appropriately. Following with Dr. Waddell  Hypothyroidism-TSH 5.107 on 05/28/2022.  Free T41.20 On 05/28/2022 Follows with PCP  CKD stage IIIb-creatinine 1.51 on 08/12/2022. Follows with PCP  Disposition: Follow-up with Dr. Maxcy or me  in 6 months.   Josefa HERO. Aalayah Riles NP-C     09/22/2023, 1:55 PM  [facility] [address] Office [phone] Fax [phone]    I spent 14 minutes examining this patient, reviewing medications, and using patient centered shared decision making involving her cardiac care.  Prior to her visit I spent greater than 20 minutes reviewing her past medical history,  medications, and prior cardiac tests.

## 2023-09-22 ENCOUNTER — Encounter: Payer: Self-pay | Admitting: General Practice

## 2023-09-22 ENCOUNTER — Ambulatory Visit: Attending: Cardiology | Admitting: General Practice

## 2023-09-22 VITALS — BP 148/72 | HR 79 | Ht 61.0 in | Wt 115.8 lb

## 2023-09-22 DIAGNOSIS — I10 Essential (primary) hypertension: Secondary | ICD-10-CM | POA: Diagnosis not present

## 2023-09-22 DIAGNOSIS — R001 Bradycardia, unspecified: Secondary | ICD-10-CM

## 2023-09-22 DIAGNOSIS — E038 Other specified hypothyroidism: Secondary | ICD-10-CM

## 2023-09-22 DIAGNOSIS — N1832 Chronic kidney disease, stage 3b: Secondary | ICD-10-CM | POA: Diagnosis not present

## 2023-09-22 NOTE — Patient Instructions (Signed)
 Medication Instructions:  Your physician recommends that you continue on your current medications as directed. Please refer to the Current Medication list given to you today.  *If you need a refill on your cardiac medications before your next appointment, please call your pharmacy*  Lab Work: NONE If you have labs (blood work) drawn today and your tests are completely normal, you will receive your results only by: MyChart Message (if you have MyChart) OR A paper copy in the mail If you have any lab test that is abnormal or we need to change your treatment, we will call you to review the results.  Testing/Procedures: NONE  Follow-Up: At [facility], you and your health needs are our priority.  As part of our continuing mission to provide you with exceptional heart care, our providers are all part of one team.  This team includes your primary Cardiologist (physician) and Advanced Practice Providers or APPs (Physician Assistants and Nurse Practitioners) who all work together to provide you with the care you need, when you need it.  Your next appointment:   6-9 months  Provider:   Emelia, NP  We recommend signing up for the patient portal called MyChart.  Sign up information is provided on this After Visit Summary.  MyChart is used to connect with patients for Virtual Visits (Telemedicine).  Patients are able to view lab/test results, encounter notes, upcoming appointments, etc.  Non-urgent messages can be sent to your provider as well.   To learn more about what you can do with MyChart, go to [email or web address].   Other Instructions Increase physical activity

## 2023-09-22 NOTE — Addendum Note (Signed)
 Addended by: Konnie Noffsinger N on: 09/22/2023 02:50 PM   Modules accepted: Orders

## 2023-10-14 NOTE — Addendum Note (Signed)
 Addended by: TAWNI DRILLING D on: 10/14/2023 11:58 AM   Modules accepted: Orders

## 2023-10-14 NOTE — Progress Notes (Signed)
 Remote pacemaker transmission.

## 2023-10-15 ENCOUNTER — Ambulatory Visit: Payer: Self-pay | Admitting: General Practice

## 2023-10-15 LAB — BASIC METABOLIC PANEL WITH GFR
BUN/Creatinine Ratio: 25 (ref 12–28)
BUN: 25 mg/dL (ref 8–27)
CO2: 23 mmol/L (ref 20–29)
Calcium: 9.3 mg/dL (ref 8.7–10.3)
Chloride: 103 mmol/L (ref 96–106)
Creatinine, Ser: 1.02 mg/dL — AB (ref 0.57–1.00)
Glucose: 99 mg/dL (ref 70–99)
Potassium: 4.6 mmol/L (ref 3.5–5.2)
Sodium: 144 mmol/L (ref 134–144)
eGFR: 53 mL/min/1.73 — AB (ref >59)

## 2023-10-15 LAB — CBC
Hematocrit: 38.5 % (ref 34.0–46.6)
Hemoglobin: 12.5 g/dL (ref 11.1–15.9)
MCH: 32.3 pg (ref 26.6–33.0)
MCHC: 32.5 g/dL (ref 31.5–35.7)
MCV: 100 fL — ABNORMAL HIGH (ref 79–97)
Platelets: 257 x10E3/uL (ref 150–450)
RBC: 3.87 x10E6/uL (ref 3.77–5.28)
RDW: 12.4 % (ref 11.7–15.4)
WBC: 7.3 x10E3/uL (ref 3.4–10.8)

## 2023-11-26 ENCOUNTER — Telehealth: Payer: Self-pay | Admitting: Internal Medicine

## 2023-11-26 NOTE — Telephone Encounter (Signed)
 She (the patient) just received a letter from [facility] stating that they sent us  forms to be filled out for special needs; because she has a pacemaker and essential tremors.  Reports that if the forms are not received by 12/15/18, then her insurance will be cancelled.  Informed her that I would send this information to her provider and covering nurse and someone will get back in touch with her soon. She was very Adult nurse.

## 2023-11-26 NOTE — Telephone Encounter (Signed)
 Patient calling in about a form that was sent to our office, for the dr to filled out from [facility]. States the form need to be filled by the 30th and returned back. Please advise

## 2023-12-07 ENCOUNTER — Ambulatory Visit (INDEPENDENT_AMBULATORY_CARE_PROVIDER_SITE_OTHER): Payer: Medicare HMO

## 2023-12-07 DIAGNOSIS — R001 Bradycardia, unspecified: Secondary | ICD-10-CM

## 2023-12-08 LAB — CUP PACEART REMOTE DEVICE CHECK
Battery Remaining Longevity: 136 mo
Battery Voltage: 3.03 V
Brady Statistic AP VP Percent: 0.03 %
Brady Statistic AP VS Percent: 98.77 %
Brady Statistic AS VP Percent: 0 %
Brady Statistic AS VS Percent: 1.19 %
Brady Statistic RA Percent Paced: 98.74 %
Brady Statistic RV Percent Paced: 0.04 %
Date Time Interrogation Session: 20250921210858
Implantable Lead Connection Status: 753985
Implantable Lead Connection Status: 753985
Implantable Lead Implant Date: 20240610
Implantable Lead Implant Date: 20240610
Implantable Lead Location: 753859
Implantable Lead Location: 753860
Implantable Lead Model: 3830
Implantable Lead Model: 5076
Implantable Pulse Generator Implant Date: 20240610
Lead Channel Impedance Value: 285 Ohm
Lead Channel Impedance Value: 304 Ohm
Lead Channel Impedance Value: 380 Ohm
Lead Channel Impedance Value: 437 Ohm
Lead Channel Pacing Threshold Amplitude: 0.375 V
Lead Channel Pacing Threshold Amplitude: 1.125 V
Lead Channel Pacing Threshold Pulse Width: 0.4 ms
Lead Channel Pacing Threshold Pulse Width: 0.4 ms
Lead Channel Sensing Intrinsic Amplitude: 13.875 mV
Lead Channel Sensing Intrinsic Amplitude: 13.875 mV
Lead Channel Sensing Intrinsic Amplitude: 2 mV
Lead Channel Sensing Intrinsic Amplitude: 2 mV
Lead Channel Setting Pacing Amplitude: 2 V
Lead Channel Setting Pacing Amplitude: 2.5 V
Lead Channel Setting Pacing Pulse Width: 0.4 ms
Lead Channel Setting Sensing Sensitivity: 1.2 mV
Zone Setting Status: 755011

## 2023-12-09 NOTE — Progress Notes (Signed)
 Remote PPM Transmission

## 2023-12-13 ENCOUNTER — Ambulatory Visit: Payer: Self-pay | Admitting: Internal Medicine

## 2023-12-14 NOTE — Telephone Encounter (Signed)
 Pt calling in about this paperwork. She states she spoke to someone last week who told her they found it under some papers and  she would take it to the provider. Does not remember who she spoke to. Please advise.

## 2023-12-14 NOTE — Telephone Encounter (Signed)
 I have no idea what anyone if referring to and have no opinion.

## 2023-12-18 ENCOUNTER — Emergency Department (HOSPITAL_COMMUNITY)

## 2023-12-18 ENCOUNTER — Emergency Department (HOSPITAL_COMMUNITY): Admission: EM | Admit: 2023-12-18 | Discharge: 2023-12-18 | Disposition: A | Discharge status: Home / Self Care

## 2023-12-18 ENCOUNTER — Other Ambulatory Visit: Payer: Self-pay

## 2023-12-18 DIAGNOSIS — R1084 Generalized abdominal pain: Secondary | ICD-10-CM | POA: Diagnosis not present

## 2023-12-18 DIAGNOSIS — E039 Hypothyroidism, unspecified: Secondary | ICD-10-CM | POA: Insufficient documentation

## 2023-12-18 DIAGNOSIS — Z79890 Hormone replacement therapy (postmenopausal): Secondary | ICD-10-CM | POA: Insufficient documentation

## 2023-12-18 DIAGNOSIS — N189 Chronic kidney disease, unspecified: Secondary | ICD-10-CM | POA: Insufficient documentation

## 2023-12-18 DIAGNOSIS — Z7982 Long term (current) use of aspirin: Secondary | ICD-10-CM | POA: Diagnosis not present

## 2023-12-18 DIAGNOSIS — R339 Retention of urine, unspecified: Secondary | ICD-10-CM | POA: Insufficient documentation

## 2023-12-18 DIAGNOSIS — Z950 Presence of cardiac pacemaker: Secondary | ICD-10-CM | POA: Insufficient documentation

## 2023-12-18 DIAGNOSIS — Z79899 Other long term (current) drug therapy: Secondary | ICD-10-CM | POA: Insufficient documentation

## 2023-12-18 DIAGNOSIS — Z8673 Personal history of transient ischemic attack (TIA), and cerebral infarction without residual deficits: Secondary | ICD-10-CM | POA: Diagnosis not present

## 2023-12-18 DIAGNOSIS — I129 Hypertensive chronic kidney disease with stage 1 through stage 4 chronic kidney disease, or unspecified chronic kidney disease: Secondary | ICD-10-CM | POA: Insufficient documentation

## 2023-12-18 DIAGNOSIS — R109 Unspecified abdominal pain: Secondary | ICD-10-CM

## 2023-12-18 LAB — CBC WITH DIFFERENTIAL/PLATELET
Abs Immature Granulocytes: 0.03 K/uL (ref 0.00–0.07)
Basophils Absolute: 0.1 K/uL (ref 0.0–0.1)
Basophils Relative: 1 %
Eosinophils Absolute: 0.2 K/uL (ref 0.0–0.5)
Eosinophils Relative: 2 %
HCT: 38.5 % (ref 36.0–46.0)
Hemoglobin: 12.8 g/dL (ref 12.0–15.0)
Immature Granulocytes: 0 %
Lymphocytes Relative: 17 %
Lymphs Abs: 1.7 K/uL (ref 0.7–4.0)
MCH: 32.2 pg (ref 26.0–34.0)
MCHC: 33.2 g/dL (ref 30.0–36.0)
MCV: 97 fL (ref 80.0–100.0)
Monocytes Absolute: 0.7 K/uL (ref 0.1–1.0)
Monocytes Relative: 7 %
Neutro Abs: 7.2 K/uL (ref 1.7–7.7)
Neutrophils Relative %: 73 %
Platelets: 219 K/uL (ref 150–400)
RBC: 3.97 MIL/uL (ref 3.87–5.11)
RDW: 12.2 % (ref 11.5–15.5)
WBC: 9.9 K/uL (ref 4.0–10.5)
nRBC: 0 % (ref 0.0–0.2)

## 2023-12-18 LAB — COMPREHENSIVE METABOLIC PANEL WITH GFR
ALT: 29 U/L (ref 0–44)
AST: 38 U/L (ref 15–41)
Albumin: 3.6 g/dL (ref 3.5–5.0)
Alkaline Phosphatase: 76 U/L (ref 38–126)
Anion gap: 11 (ref 5–15)
BUN: 19 mg/dL (ref 8–23)
CO2: 26 mmol/L (ref 22–32)
Calcium: 8.7 mg/dL — ABNORMAL LOW (ref 8.9–10.3)
Chloride: 102 mmol/L (ref 98–111)
Creatinine, Ser: 1.06 mg/dL — ABNORMAL HIGH (ref 0.44–1.00)
GFR, Estimated: 50 mL/min — ABNORMAL LOW (ref >60)
Glucose, Bld: 92 mg/dL (ref 70–99)
Potassium: 3.8 mmol/L (ref 3.5–5.1)
Sodium: 139 mmol/L (ref 135–145)
Total Bilirubin: 0.3 mg/dL (ref 0.0–1.2)
Total Protein: 7.1 g/dL (ref 6.5–8.1)

## 2023-12-18 LAB — URINALYSIS, W/ REFLEX TO CULTURE (INFECTION SUSPECTED)
Bilirubin Urine: NEGATIVE
Glucose, UA: NEGATIVE mg/dL
Hgb urine dipstick: NEGATIVE
Ketones, ur: NEGATIVE mg/dL
Nitrite: NEGATIVE
Protein, ur: NEGATIVE mg/dL
Specific Gravity, Urine: 1.015 (ref 1.005–1.030)
pH: 6 (ref 5.0–8.0)

## 2023-12-18 LAB — LIPASE, BLOOD: Lipase: 79 U/L — ABNORMAL HIGH (ref 11–51)

## 2023-12-18 MED ORDER — AMOXICILLIN-POT CLAVULANATE 875-125 MG PO TABS: 1.0000 | ORAL_TABLET | Freq: Two times a day (BID) | ORAL | 0 refills | Status: AC

## 2023-12-18 MED ORDER — IOHEXOL 350 MG/ML SOLN
55.0000 mL | Freq: Once | INTRAVENOUS | Status: AC | PRN
Start: 2023-12-18 — End: 2023-12-18
  Administered 2023-12-18: 55 mL via INTRAVENOUS

## 2023-12-18 MED ORDER — AMOXICILLIN-POT CLAVULANATE 875-125 MG PO TABS
1.0000 | ORAL_TABLET | Freq: Once | ORAL | Status: AC
  Administered 2023-12-18: 1 via ORAL
  Filled 2023-12-18: qty 1

## 2023-12-18 MED ORDER — LIDOCAINE HCL URETHRAL/MUCOSAL 2 % EX GEL: 1.0000 | Freq: Once | CUTANEOUS | Status: DC | PRN

## 2023-12-18 NOTE — ED Triage Notes (Signed)
 Pt co urinary retention with constipation x 1 day. Denies pain

## 2023-12-18 NOTE — ED Notes (Signed)
 Pt ambulatory to [facility]

## 2023-12-18 NOTE — ED Provider Triage Note (Signed)
 Emergency Medicine Provider Triage Evaluation Note  Brandy Hicks , a 89 y.o. female  was evaluated in triage.  Pt complains of urinary retention since yesterday evening, also has not had a bowel movement in the last 2 days.  Has been admitted twice before for similar type symptoms.  Patient is on chlorthalidone  as well, has increased urinary urge but is not able to pass urine..  Review of Systems  Positive: As above Negative:   Physical Exam  BP (!) 161/63 (BP Location: Right Arm)   Pulse 75   Temp 98.6 F (37 C)   Resp 16   SpO2 97%  Gen:   Awake, no distress   Resp:  Normal effort  MSK:   Moves extremities without difficulty  Other:  Bladder distention is appreciated on exam.  Medical Decision Making  Medically screening exam initiated at 2:05 PM.  Appropriate orders placed.  Brandy Hicks was informed that the remainder of the evaluation will be completed by another provider, this initial triage assessment does not replace that evaluation, and the importance of remaining in the ED until their evaluation is complete.  Initial orders as for urinary retention placed.   Myriam Dorn BROCKS, [place] 12/18/23 1406

## 2023-12-18 NOTE — ED Triage Notes (Signed)
 Patient has not urinated since last night. Patient reports this has happened to her before  and they used a catheter to drain her bladder. Mild discomfort in lower abdomen.

## 2023-12-18 NOTE — ED Notes (Signed)
 Legal guardian Manuelita was called and verified she will be picking pt up. Manuelita updated on d/c and POC, verbalized understanding. Pt changed into personal clothing, LDA removed, pt ambulatory out of ED  in NAD w/ paperwork and all belongings

## 2023-12-18 NOTE — ED Notes (Signed)
 Pt bladder scanned per MD verbal order, 110 ml noted, has been greater than 30 minutes since pt voided. MD aware, states no need for foley at this time

## 2023-12-18 NOTE — ED Provider Notes (Signed)
 Patient handed off to me at 3 PM.  Awaiting lab work and CT scan abdomen pelvis.  She was able to void urinalysis negative for infection.  She did not have any retained urine in the bladder afterwards.  She is feeling much better.  CT scan showed a be proctitis.  She is not having any pain however will treat with Augmentin .  She has got mixed liquid and formed throughout the colon.  Ultimately lab work was unremarkable.  No significant leukocytosis anemia or electrolyte abnormality.  Will conservatively treat with antibiotics have her follow-up with primary care.  Discharge.  No evidence of urinary retention.  This chart was dictated using voice recognition software.  Despite best efforts to proofread,  errors can occur which can change the documentation meaning.    Ruthe Cornet, DO 12/18/23 2012

## 2023-12-18 NOTE — Discharge Instructions (Addendum)
 Take antibiotic as prescribed.  Follow-up with your primary care doctor.  Return if symptoms worsen.

## 2023-12-18 NOTE — ED Provider Notes (Signed)
  EMERGENCY DEPARTMENT AT [facility] Provider Note   CSN: [id number] Arrival date & time: 12/18/23  1322     Patient presents with: Urinary Retention and Constipation  HPI Brandy Hicks Der Kathern is a 89 y.o. female with hypertension, TIA, CKD, hypothyroid, indwelling pacemaker, s/p cholecystectomy presenting for constipation and urinary retention.  She states she has had urinary retention and constipation for 1 day.  Last urinated yesterday evening and has not had a bowel movement since 2 days ago.  States she is having difficulty passing gas as well.  Denied nausea and vomiting.  Denies vaginal symptoms.    Constipation      Prior to Admission medications   Medication Sig Start Date End Date Taking? Authorizing Provider  acetaminophen  (TYLENOL ) 500 MG tablet Take 500 mg by mouth 2 (two) times daily.    [provider]  amLODipine  (NORVASC ) 10 MG tablet Take 10 mg by mouth daily.    [provider]  aspirin  81 MG tablet Take 81 mg by mouth daily.    [provider]  atorvastatin  (LIPITOR) 40 MG tablet Take 1 tablet (40 mg total) by mouth daily. 05/30/22   Sherrill Cable Latif, DO  chlorthalidone  (HYGROTON ) 25 MG tablet Take 25 mg by mouth daily. Patient not taking: Reported on 09/22/2023    [provider]  Cholecalciferol  (VITAMIN D -3) 125 MCG (5000 UT) TABS Take 5,000 Units by mouth daily.    [provider]  ciclopirox  (PENLAC ) 8 % solution Apply topically at bedtime. Apply thin layer over nail. Apply daily over previous coat. Remove weekly with polish remover. 07/27/23   McCaughan, Dia D, DPM  [facility] DICLOFENAC SODIUM 1 % GEL Apply topically daily. Patient not taking: Reported on 09/22/2023 02/27/23   [provider]  fluticasone  (FLONASE ) 50 MCG/ACT nasal spray Place 1 spray into both nostrils daily as needed for allergies or rhinitis.    [provider]  levothyroxine  (SYNTHROID ) 150 MCG tablet Take 150  mcg by mouth daily. 02/04/19   [provider]  losartan  (COZAAR ) 100 MG tablet Take 100 mg by mouth daily.    [provider]  Melatonin 10 MG TABS Take 2.5 mg by mouth at bedtime as needed (sleep). Patient taking differently: Take 2.5 mg by mouth at bedtime as needed (sleep). 10 mg once before bed    [provider]  mirtazapine (REMERON) 7.5 MG tablet Take 7.5 mg by mouth at bedtime. Patient not taking: Reported on 09/22/2023 09/15/22   [provider]  Prenatal Vit-Fe Fumarate-FA (PRENATAL PO) Take 1 tablet by mouth daily.    [provider]  primidone  (MYSOLINE ) 50 MG tablet Take 200 mg by mouth 2 (two) times daily.    [provider]  propranolol  ER (INDERAL  LA) 60 MG 24 hr capsule Take 1 capsule (60 mg total) by mouth daily. 09/04/22   Waddell Danelle ORN, MD  sertraline  (ZOLOFT ) 50 MG tablet Take 50 mg by mouth daily.  05/21/15   [provider]    Allergies: Hydroquinone    Review of Systems  Gastrointestinal:  Positive for constipation.    Updated Vital Signs BP (!) 161/63 (BP Location: Right Arm)   Pulse 75   Temp 98.6 F (37 C)   Resp 16   SpO2 97%   Physical Exam Vitals and nursing note reviewed.  HENT:     Head: Normocephalic and atraumatic.     Mouth/Throat:     Mouth: Mucous membranes are  moist.  Eyes:     General:        Right eye: No discharge.        Left eye: No discharge.     Conjunctiva/sclera: Conjunctivae normal.  Cardiovascular:     Rate and Rhythm: Normal rate and regular rhythm.     Pulses: Normal pulses.     Heart sounds: Normal heart sounds.  Pulmonary:     Effort: Pulmonary effort is normal.     Breath sounds: Normal breath sounds.  Abdominal:     General: Abdomen is flat.     Palpations: Abdomen is soft.     Tenderness: There is abdominal tenderness.  Skin:    General: Skin is warm and dry.  Neurological:     General: No focal deficit present.  Psychiatric:        Mood and Affect:  Mood normal.     (all labs ordered are listed, but only abnormal results are displayed) Labs Reviewed  CBC WITH DIFFERENTIAL/PLATELET  COMPREHENSIVE METABOLIC PANEL WITH GFR  LIPASE, BLOOD  URINALYSIS, W/ REFLEX TO CULTURE (INFECTION SUSPECTED)    EKG: None  Radiology: No results found.   Procedures   Medications Ordered in the ED  lidocaine  (XYLOCAINE ) 2 % jelly 1 Application (has no administration in time range)                                    Medical Decision Making Amount and/or Complexity of Data Reviewed Labs: ordered. Radiology: ordered.   89 year old well-appearing female presenting for concern for urinary retention and constipation.  Exam notable for mild generalized abdominal tenderness.  Prevoid bladder scan is 275 mL.  Patient was able to void 200 mL.  Abdominal labs and urinalysis are pending.  CT abdomen pelvis also ordered. Disposition based on CT scan results and remaining labs.  Signed out patient to oncoming attending Dr. Ruthe.     Final diagnoses:  Abdominal pain, unspecified abdominal location    ED Discharge Orders     None          Lang Norleen POUR, PA-C 12/18/23 1547    Ula Prentice SAUNDERS, MD 12/21/23 [phone]

## 2024-02-02 ENCOUNTER — Ambulatory Visit: Admitting: Podiatry

## 2024-02-02 DIAGNOSIS — B351 Tinea unguium: Secondary | ICD-10-CM | POA: Diagnosis not present

## 2024-02-02 DIAGNOSIS — M79675 Pain in left toe(s): Secondary | ICD-10-CM

## 2024-02-02 DIAGNOSIS — M79674 Pain in right toe(s): Secondary | ICD-10-CM | POA: Diagnosis not present

## 2024-02-02 NOTE — Progress Notes (Signed)
    Subjective:  Patient ID: Brandy Hicks, female    DOB: 12-04-1932,  MRN: [id number]  Brandy Hicks presents to clinic today for:  Chief Complaint  Patient presents with   RFC    fungal nail recheck and RFC. Topical solution on both great toenails. Non diabetic.     Patient notes nails are thick, discolored, elongated and painful in shoegear when trying to ambulate.   He has been applying the ciclopirox  solution to the great toenails.  PCP is Pura Lenis, MD.  Past Medical History:  Diagnosis Date   Chronic kidney disease, stage 3 unspecified (HCC)    Constipation, unspecified    Depression    Essential hypertension    Essential tremor    Guillain Barr syndrome    Hormone replacement therapy    Hyperlipidemia    Hypertension    Hypothyroid    Hypothyroidism, unspecified    Impacted cerumen of both ears    Osteoporosis    Ovarian cyst    Pneumonia 05/2022   Radiculopathy, thoracic region    Retention of urine, unspecified    Rosacea    Seborrheic dermatitis, unspecified    Somnolence    Thoracic spine pain    TIA (transient ischemic attack)    Tremor    Unspecified mood (affective) disorder    Past Surgical History:  Procedure Laterality Date   CHOLECYSTECTOMY N/A 01/03/2020   Procedure: LAPAROSCOPIC CHOLECYSTECTOMY;  Surgeon: Vanderbilt Ned, MD;  Location: [facility] OR;  Service: General;  Laterality: N/A;   FACIAL COSMETIC SURGERY     GALLBLADDER SURGERY     Post G[place]rene Gallbladder Removal    HEEL SPUR SURGERY     HEEL SPUR SURGERY     PACEMAKER IMPLANT N/A 08/25/2022   Procedure: PACEMAKER IMPLANT;  Surgeon: Waddell Danelle ORN, MD;  Location: [facility] INVASIVE CV LAB;  Service: Cardiovascular;  Laterality: N/A;   TONSILLECTOMY     Allergies  Allergen Reactions   Hydroquinone     Pinkness and edema of face and eyelids, severe    Review of Systems: Negative except as noted in the HPI.  Objective:  Brandy Hicks is a pleasant 89  y.o. female in NAD. AAO x 3.  Vascular Examination: Capillary refill time is 3-5 seconds to toes bilateral. Palpable pedal pulses b/l LE. Digital hair present b/l.  Skin temperature gradient WNL b/l. No varicosities b/l. No cyanosis noted b/l.   Dermatological Examination: Pedal skin with normal turgor, texture and tone b/l. No open wounds. No interdigital macerations b/l. Toenails x10 are 3mm thick, discolored, dystrophic with subungual debris. There is pain with compression of the nail plates.  They are elongated x10  Assessment/Plan: 1. Pain due to onychomycosis of toenails of both feet     The mycotic toenails were sharply debrided x10 with sterile nail nippers and a power debriding burr to decrease bulk/thickness and length.    Return in about 6 months (around 08/01/2024) for RFC.   Awanda CHARM Imperial, DPM, FACFAS [facility]     [address] [place], [place] [address]                Office [phone]  Fax [phone]

## 2024-03-03 NOTE — Progress Notes (Unsigned)
 Cardiology Clinic Note   Patient Name: Brandy Hicks Date of Encounter: 03/03/2024  Primary Care Provider:  Pura Lenis, MD Primary Cardiologist:  Vinie JAYSON Maxcy, MD  Patient Profile    Brandy Hicks 89 year old female presents to the clinic today for evaluation of her symptomatic bradycardia.  Past Medical History    Past Medical History:  Diagnosis Date   Chronic kidney disease, stage 3 unspecified (HCC)    Constipation, unspecified    Depression    Essential hypertension    Essential tremor    Guillain Barr syndrome    Hormone replacement therapy    Hyperlipidemia    Hypertension    Hypothyroid    Hypothyroidism, unspecified    Impacted cerumen of both ears    Osteoporosis    Ovarian cyst    Pneumonia 05/2022   Radiculopathy, thoracic region    Retention of urine, unspecified    Rosacea    Seborrheic dermatitis, unspecified    Somnolence    Thoracic spine pain    TIA (transient ischemic attack)    Tremor    Unspecified mood (affective) disorder    Past Surgical History:  Procedure Laterality Date   CHOLECYSTECTOMY N/A 01/03/2020   Procedure: LAPAROSCOPIC CHOLECYSTECTOMY;  Surgeon: Vanderbilt Ned, MD;  Location: [facility] OR;  Service: General;  Laterality: N/A;   FACIAL COSMETIC SURGERY     GALLBLADDER SURGERY     Post G[place]rene Gallbladder Removal    HEEL SPUR SURGERY     HEEL SPUR SURGERY     PACEMAKER IMPLANT N/A 08/25/2022   Procedure: PACEMAKER IMPLANT;  Surgeon: Waddell Danelle ORN, MD;  Location: [facility] INVASIVE CV [facility];  Service: Cardiovascular;  Laterality: N/A;   TONSILLECTOMY      Allergies  Allergies  Allergen Reactions   Hydroquinone     Pinkness and edema of face and eyelids, severe    History of Present Illness    Brandy Hicks is a PMH of hypertension, essential tremor (previously on propranolol  which was stopped due to symptomatic bradycardia) hyperlipidemia, osteoporosis, hypothyroidism, Guillain-Barr  syndrome, CKD stage IIIa, former tobacco use, possible emphysema and history of E. coli sepsis infection (admission 05/19/2022 - 05/24/2022).  She was admitted to the hospital on 05/29/2022 for evaluation of bradycardia.  During that time she reported increased fatigue.  She had been using 60 mg of propranolol  for tremors.  Her propranolol  was held for washout.  Her telemetry showed overall improvement.  She was noted to return to sinus rhythm with rate of 60-80 and incomplete bundle branch block.  She was noted to have one nonsustained pause lasting 1.8 seconds.  It was felt that there was no indication for pacemaker at that time.  Cardiac event monitor was ordered and showed sinus rhythm with a pauses up to 3.4 seconds and 6 episodes of SVT with the longest being 12 beats.  Her sinus pauses were noted to happen at night.  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  Dr. Maxcy is reviewing with EP.  She presented to the clinic 06/27/22 for follow-up evaluation and stated she had occasional episodes of fatigue in the afternoon.  We reviewed her recent hospitalizations.  I also spoke with her daughter on the phone who is an [employer].  We reviewed the importance of avoiding beta-blocker medications/AV nodal blocking agents.  We reviewed her cardiac event monitor she expressed understanding.  She presented with her partner Danny.  At that  time I refered to EP for further evaluation and recommendations.  We reviewed recommendations for presenting to the emergency department.   She was seen in follow-up by Dr. Waddell on 07/24/2022.  She underwent pacemaker implant on 08/25/2022.  She was again seen by Dr. Waddell on 11/27/2022.  She continued to have severe tremors.  She was using beta-blocker therapy for this.  She denied episodes of syncope.  She denied chest pain or shortness of breath.  She did note some fatigue.  She also admitted to being somewhat sedentary.  She felt improved status post pacemaker  insertion.  She was continued on her blood pressure medication.  Blood pressure was noted to be 162/68.  She admitted to having elevated blood pressure while being in the doctor's office.   She presented to the clinic 03/23/23 for follow-up evaluation and stated she was sick prior to her last appointment and contacted the after-hours line.  She received a message about missing her appointment.  This was upsetting to her.  She also had an appointment with Dr. Waddell and did not receive instruction/follow-up to ask about the appointment until 2 days after.  She was concerned that she was getting a reputation for no-shows on her chart.  We reviewed her last clinic visit and blood pressure.  We also discussed her pacemaker downloads.  She and her partner expressed understanding.  She was tolerating her medication well.  Her initial blood pressure was 144/82 and on recheck was 132/68.  She reported that she had been fairly sedentary and had been trying to gain some weight.  I asked her to slowly increase her walking and increase the calories in her diet.  She presents to clinic today for follow-up evaluation states her primary doctor is leaving the area.  She has established with a new primary care physician.  Initially her blood pressure is elevated at 162/80.  On recheck it is 148/72.  She reports compliance with her medications.  She notes that at home her blood pressure runs routinely in the 120s over 70s.  She notes that she is not been very physically active.  We reviewed the importance of increased physical activity and continuing low-sodium diet.  I will continue her current medication therapy, give her the salty 6 diet information and plan follow-up in 6 months..   Today she denies chest pain, shortness of breath, lower extremity edema, palpitations, melena, hematuria, hemoptysis, diaphoresis, weakness, presyncope, syncope, orthopnea, and PND.    Home Medications    Prior to Admission medications    Medication Sig Start Date End Date Taking? Authorizing Provider  amLODipine  (NORVASC ) 10 MG tablet Take 10 mg by mouth daily.    [provider]  aspirin  81 MG tablet Take 81 mg by mouth daily.    [provider]  atorvastatin  (LIPITOR) 40 MG tablet Take 1 tablet (40 mg total) by mouth daily. 05/30/22   Sheikh, Omair Latif, DO  chlorthalidone  (HYGROTEN) 15 MG tablet Take 1 tablet (15 mg total) by mouth daily. 05/24/22   Sherrill Cable Latif, DO  Cholecalciferol  (VITAMIN D ) 125 MCG (5000 UT) CAPS Take 5,000 Units by mouth daily.    [provider]  feeding supplement (ENSURE ENLIVE / ENSURE PLUS) LIQD Take 237 mLs by mouth 3 (three) times daily between meals. Patient taking differently: Take 237 mLs by mouth 2 (two) times daily between meals. 05/24/22   Sherrill Cable Latif, DO  fluticasone  (FLONASE ) 50 MCG/ACT nasal spray Place 2 sprays into both nostrils  daily.    [provider]  levothyroxine  (SYNTHROID ) 150 MCG tablet Take 150 mcg by mouth daily. 02/04/19   [provider]  losartan  (COZAAR ) 100 MG tablet Take 100 mg by mouth daily.    [provider]  Melatonin 10 MG TABS Take 10 mg by mouth at bedtime.     [provider]  Multiple Vitamins-Minerals (MULTIVITAMIN ADULT PO) Take 1 tablet by mouth daily.    [provider]  primidone  (MYSOLINE ) 50 MG tablet Take 200 mg by mouth 2 (two) times daily.    [provider]  sertraline  (ZOLOFT ) 50 MG tablet Take 50 mg by mouth daily.  05/21/15   [provider]    Family History    Family History  Problem Relation Age of Onset   Stroke Mother        CABG age 80   CAD Mother    Hypertension Father    CAD Father    Prostate cancer Father    Liver disease Sister    Hypertension Daughter    Thyroid  disease Daughter    Breast cancer Daughter    Heart murmur Daughter    Hypertension Daughter    Multiple sclerosis Daughter    Kidney disease Daughter    She  indicated that her mother is deceased. She indicated that her father is deceased. She indicated that both of her sisters are alive. She indicated that her maternal grandmother is deceased. She indicated that her maternal grandfather is deceased. She indicated that her paternal grandmother is deceased. She indicated that her paternal grandfather is deceased. She indicated that both of her daughters are alive.  Social History    Social History   Socioeconomic History   Marital status: Widowed    Spouse name: Not on file   Number of children: Not on file   Years of education: Not on file   Highest education level: Not on file  Occupational History   Not on file  Tobacco Use   Smoking status: Former   Smokeless tobacco: Never  Vaping Use   Vaping status: Never Used  Substance and Sexual Activity   Alcohol use: No   Drug use: No   Sexual activity: Not Currently  Other Topics Concern   Not on file  Social History Narrative   Lives at alone in an apartment         Diet: Eat when hungry no special diet       Do you drink/ eat things with caffeine? Yes      Marital status:   Widowed                             What year were you married ? 1955      Do you live in a house, apartment,assistred living, condo, trailer, etc.)? Independent Living       Is it one or more stories? Elevator      How many persons live in your home ? Self      Do you have any pets in your home ?(please list) 0      Highest Level of education completed: HS      Current or past profession: [employer]      Do you exercise?  No                            Type &  how often Blank      ADVANCED DIRECTIVES (Please bring copies)      Do you have a living will? Yes      Do you have a DNR form?  Yes                     If not, do you want to discuss one?       Do you have signed POA?HPOA forms?  Yes               If so, please bring to your appointment      FUNCTIONAL STATUS- To be completed by Spouse / child /  Staff       Do you have difficulty bathing or dressing yourself ? No      Do you have difficulty preparing food or eating ?  No      Do you have difficulty managing your mediation ?  No      Do you have difficulty managing your finances ?  No (But granddaughter handles)       Do you have difficulty affording your medication ?  No         Social Drivers of Health   Tobacco Use: Medium Risk (02/29/2024)   Received from [facility]   Patient History    Smoking Tobacco Use: Former    Smokeless Tobacco Use: Never    Passive Exposure: Past  Physicist, Medical Strain: Low Risk (12/15/2023)   Received from [facility]   Overall Financial Resource Strain (CARDIA)    How hard is it for you to pay for the very basics like food, housing, medical care, and heating?: Not very hard  Food Insecurity: No Food Insecurity (12/15/2023)   Received from [facility]   Epic    Within the past 12 months, you worried that your food would run out before you got the money to buy more.: Never true    Within the past 12 months, the food you bought just didn't last and you didn't have money to get more.: Never true  Transportation Needs: No Transportation Needs (12/15/2023)   Received from [facility]   Epic    In the past 12 months, has lack of transportation kept you from medical appointments or from getting medications?: No    In the past 12 months, has lack of transportation kept you from meetings, work, or from getting things needed for daily living?: No  Physical Activity: Insufficiently Active (12/15/2023)   Received from [facility]   Exercise Vital Sign    On average, how many days per week do you engage in moderate to strenuous exercise (like a brisk walk)?: 4 days    On average, how many minutes do you engage in exercise at this level?: 20 min  Stress: No Stress Concern Present (12/15/2023)   Received from [facility]  Questionnaire    Do you feel stress - tense, restless, nervous, or anxious, or unable to sleep at night because your mind is troubled all the time - these days?: Not at all  Social Connections: Socially Integrated (12/15/2023)   Received from [facility]   Social Network    How would you rate your social network (family, work, friends)?: Good participation with social networks  Intimate Partner Violence: Not At Risk (12/15/2023)   Received from [facility]   HITS    Over the last 12 months  how often did your partner physically hurt you?: Never    Over the last 12 months how often did your partner insult you or talk down to you?: Never    Over the last 12 months how often did your partner threaten you with physical harm?: Never    Over the last 12 months how often did your partner scream or curse at you?: Never  Depression (PHQ2-9): Not on file  Alcohol Screen: Not on file  Housing: Low Risk (12/15/2023)   Received from [facility]    In the last 12 months, was there a time when you were not able to pay the mortgage or rent on time?: No    In the past 12 months, how many times have you moved where you were living?: 0    At any time in the past 12 months, were you homeless or living in a shelter (including now)?: No  Utilities: Not At Risk (12/15/2023)   Received from [facility]    In the past 12 months has the electric, gas, oil, or water company threatened to shut off services in your home?: No  Health Literacy: Not on file     Review of Systems    General:  No chills, fever, night sweats or weight changes.  Cardiovascular:  No chest pain, dyspnea on exertion, edema, orthopnea, palpitations, paroxysmal nocturnal dyspnea. Dermatological: No rash, lesions/masses Respiratory: No cough, dyspnea Urologic: No hematuria, dysuria Abdominal:   No nausea, vomiting, diarrhea, bright red blood per rectum, melena, or hematemesis Neurologic:  No visual changes, wkns, changes  in mental status. All other systems reviewed and are otherwise negative except as noted above.  Physical Exam    VS:  There were no vitals taken for this visit. , BMI There is no height or weight on file to calculate BMI. GEN: Well nourished, well developed, in no acute distress. HEENT: normal. Neck: Supple, no JVD, carotid bruits, or masses. Cardiac: RRR, no murmurs, rubs, or gallops. No clubbing, cyanosis, edema.  Radials/DP/PT 2+ and equal bilaterally.  Respiratory:  Respirations regular and unlabored, clear to auscultation bilaterally. GI: Soft, nontender, nondistended, BS + x 4. MS: no deformity or atrophy. Skin: warm and dry, no rash. Neuro:  Strength and sensation are intact.  Essential tremor Psych: Normal affect.  Accessory Clinical Findings    Recent Labs: 12/18/2023: ALT 29; BUN 19; Creatinine, Ser 1.06; Hemoglobin 12.8; Platelets 219; Potassium 3.8; Sodium 139   Recent Lipid Panel No results found for: CHOL, TRIG, HDL, CHOLHDL, VLDL, LDLCALC, LDLDIRECT  No BP recorded.  {Refresh Note OR Click here to enter BP  :1}***    ECG personally reviewed by me today-none today.  Echocardiogram 05/29/2022 1. Left ventricular ejection fraction, by estimation, is 60 to 65%. The  left ventricle has normal function. The left ventricle has no regional  wall motion abnormalities. Left ventricular diastolic parameters are  consistent with Grade I diastolic  dysfunction (impaired relaxation).   2. Right ventricular systolic function is normal. The right ventricular  size is normal.   3. Left atrial size was mildly dilated.   4. The mitral valve is normal in structure. Trivial mitral valve  regurgitation. No evidence of mitral stenosis. Moderate mitral annular  calcification.   5. The aortic valve is tricuspid. There is moderate calcification of the  aortic valve. Aortic valve regurgitation is not visualized. Aortic valve  sclerosis/calcification is present, without any  evidence of aortic  stenosis.  6. The inferior vena cava is normal in size with greater than 50%  respiratory variability, suggesting right atrial pressure of 3 mmHg.     Cardiac event monitor 05/30/2022   Patch Wear Time:  13 days and 22 hours (2024-03-15T11:21:05-398 to 2024-03-29T09:45:26-0400)   Patient had a min HR of 29 bpm, max HR of 179 bpm, and avg HR of 63 bpm. Predominant underlying rhythm was Sinus Rhythm. 6 Supraventricular Tachycardia runs occurred, the run with the fastest interval lasting 12 beats with a max rate of 179 bpm, the  longest lasting 10 beats with an avg rate of 112 bpm. 8 Pauses occurred, the longest lasting 3.4 secs (18 bpm). Idioventricular Rhythm was present. Isolated SVEs were rare (<1.0%), SVE Couplets were rare (<1.0%), and SVE Triplets were rare (<1.0%).  Isolated VEs were rare (<1.0%), VE Couplets were rare (<1.0%), and no VE Triplets were present.    Monitor showed sinus rhythm with a 8 sinus pauses up to 3.4 seconds. 6 episodes of SVT were noted up to 12 beats duration with rates up to 179 bpm. The sinus pauses were mostly at night and not patient triggered (asymptomatic).  Findings suggest tachy-brady syndrome. Will d/w EP as to whether PPM is indicated since she is asymptomatic and has washed out of beta blocker.   Vinie KYM Maxcy, MD, [facility], FACP  [place]  [facility]  Medical Director of the Advanced Lipid Disorders &  Cardiovascular Risk Reduction Clinic Diplomate of the American Board of Clinical Lipidology Attending Cardiologist   Assessment & Plan   Essential hypertension-BP today 148/72. At home 120's over 70's   Maintain blood pressure log Continue amlodipine , chlorthalidone , losartan  Increase physical activity Heart healthy low-sodium diet request CBC, BMP from primary care office  Symptomatic bradycardia-heart rate today 79 bpm  Denies further episodes of lightheadedness, presyncope or syncope.  Feels that her energy is  better post pacemaker insertion.  PPM implanted on 08/25/2022.  Device last interrogated on 09/07/2023.  Battery and lead parameter stable.  Device was programmed appropriately. Following with Dr. Waddell  Hypothyroidism-TSH 5.107 on 05/28/2022.  Free T41.20 On 05/28/2022 Follows with PCP  CKD stage IIIb-creatinine 1.51 on 08/12/2022. Follows with PCP  Disposition: Follow-up with Dr. Maxcy or me in 6 months.   Josefa HERO. Jackqueline Aquilar NP-C     03/03/2024, 1:10 PM [facility] [address] Office [phone] Fax [phone]    I spent 1***4 minutes examining this patient, reviewing medications, and using patient centered shared decision making involving her cardiac care.  I spent  20 minutes reviewing her past medical history,  medications, and prior cardiac tests.

## 2024-03-07 ENCOUNTER — Encounter: Payer: Self-pay | Admitting: General Practice

## 2024-03-07 ENCOUNTER — Ambulatory Visit: Attending: General Practice | Admitting: General Practice

## 2024-03-07 ENCOUNTER — Ambulatory Visit: Payer: Medicare HMO

## 2024-03-07 VITALS — BP 124/74 | HR 72 | Ht 61.0 in | Wt 122.1 lb

## 2024-03-07 DIAGNOSIS — R001 Bradycardia, unspecified: Secondary | ICD-10-CM

## 2024-03-07 DIAGNOSIS — I10 Essential (primary) hypertension: Secondary | ICD-10-CM | POA: Diagnosis not present

## 2024-03-07 DIAGNOSIS — E038 Other specified hypothyroidism: Secondary | ICD-10-CM

## 2024-03-07 DIAGNOSIS — N1832 Chronic kidney disease, stage 3b: Secondary | ICD-10-CM | POA: Diagnosis not present

## 2024-03-07 NOTE — Patient Instructions (Signed)
 Follow-Up: At [facility], you and your health needs are our priority.  As part of our continuing mission to provide you with exceptional heart care, our providers are all part of one team.  This team includes your primary Cardiologist (physician) and Advanced Practice Providers or APPs (Physician Assistants and Nurse Practitioners) who all work together to provide you with the care you need, when you need it.  Your next appointment:   Please make an appt for JANUARY or FEBRUARY with DR. INOCENCIO and in 6 month(s) with Vinie JAYSON Maxcy, MD or Josefa Beauvais   Other Instructions Follow and heart healthy diet and increase activity as tolerated.

## 2024-03-08 LAB — CUP PACEART REMOTE DEVICE CHECK
Battery Remaining Longevity: 133 mo
Battery Voltage: 3.02 V
Brady Statistic AP VP Percent: 0.03 %
Brady Statistic AP VS Percent: 99.18 %
Brady Statistic AS VP Percent: 0 %
Brady Statistic AS VS Percent: 0.79 %
Brady Statistic RA Percent Paced: 99.15 %
Brady Statistic RV Percent Paced: 0.03 %
Date Time Interrogation Session: 20251222003811
Implantable Lead Connection Status: 753985
Implantable Lead Connection Status: 753985
Implantable Lead Implant Date: 20240610
Implantable Lead Implant Date: 20240610
Implantable Lead Location: 753859
Implantable Lead Location: 753860
Implantable Lead Model: 3830
Implantable Lead Model: 5076
Implantable Pulse Generator Implant Date: 20240610
Lead Channel Impedance Value: 304 Ohm
Lead Channel Impedance Value: 304 Ohm
Lead Channel Impedance Value: 399 Ohm
Lead Channel Impedance Value: 437 Ohm
Lead Channel Pacing Threshold Amplitude: 0.5 V
Lead Channel Pacing Threshold Amplitude: 1.375 V
Lead Channel Pacing Threshold Pulse Width: 0.4 ms
Lead Channel Pacing Threshold Pulse Width: 0.4 ms
Lead Channel Sensing Intrinsic Amplitude: 0.625 mV
Lead Channel Sensing Intrinsic Amplitude: 0.625 mV
Lead Channel Sensing Intrinsic Amplitude: 13.25 mV
Lead Channel Sensing Intrinsic Amplitude: 13.25 mV
Lead Channel Setting Pacing Amplitude: 2 V
Lead Channel Setting Pacing Amplitude: 2.5 V
Lead Channel Setting Pacing Pulse Width: 0.4 ms
Lead Channel Setting Sensing Sensitivity: 1.2 mV
Zone Setting Status: 755011

## 2024-03-09 NOTE — Progress Notes (Signed)
 Remote PPM Transmission

## 2024-03-13 ENCOUNTER — Ambulatory Visit: Payer: Self-pay | Admitting: Internal Medicine

## 2024-04-12 ENCOUNTER — Ambulatory Visit: Admitting: Cardiology

## 2024-08-01 ENCOUNTER — Ambulatory Visit: Admitting: Podiatry

## 2024-08-02 ENCOUNTER — Ambulatory Visit: Admitting: Cardiology
